# Patient Record
Sex: Female | Born: 1940 | Race: White | Hispanic: No | Marital: Married | Smoking: Never smoker
Health system: Southern US, Community
[De-identification: ages and names within clinical notes are randomized; demographics above are authoritative.]

## PROBLEM LIST (undated history)

## (undated) DIAGNOSIS — I10 Essential (primary) hypertension: Secondary | ICD-10-CM

## (undated) DIAGNOSIS — D649 Anemia, unspecified: Secondary | ICD-10-CM

## (undated) DIAGNOSIS — M316 Other giant cell arteritis: Secondary | ICD-10-CM

## (undated) DIAGNOSIS — E785 Hyperlipidemia, unspecified: Secondary | ICD-10-CM

## (undated) DIAGNOSIS — E119 Type 2 diabetes mellitus without complications: Secondary | ICD-10-CM

## (undated) DIAGNOSIS — K635 Polyp of colon: Secondary | ICD-10-CM

## (undated) DIAGNOSIS — G4730 Sleep apnea, unspecified: Secondary | ICD-10-CM

## (undated) DIAGNOSIS — M545 Low back pain, unspecified: Secondary | ICD-10-CM

## (undated) DIAGNOSIS — J45909 Unspecified asthma, uncomplicated: Secondary | ICD-10-CM

## (undated) DIAGNOSIS — G8929 Other chronic pain: Secondary | ICD-10-CM

## (undated) DIAGNOSIS — R0602 Shortness of breath: Secondary | ICD-10-CM

## (undated) DIAGNOSIS — I639 Cerebral infarction, unspecified: Secondary | ICD-10-CM

## (undated) DIAGNOSIS — D696 Thrombocytopenia, unspecified: Secondary | ICD-10-CM

## (undated) DIAGNOSIS — M25559 Pain in unspecified hip: Secondary | ICD-10-CM

## (undated) DIAGNOSIS — R0600 Dyspnea, unspecified: Secondary | ICD-10-CM

## (undated) DIAGNOSIS — M1990 Unspecified osteoarthritis, unspecified site: Secondary | ICD-10-CM

## (undated) DIAGNOSIS — D509 Iron deficiency anemia, unspecified: Secondary | ICD-10-CM

## (undated) DIAGNOSIS — K219 Gastro-esophageal reflux disease without esophagitis: Secondary | ICD-10-CM

## (undated) DIAGNOSIS — I2510 Atherosclerotic heart disease of native coronary artery without angina pectoris: Secondary | ICD-10-CM

## (undated) DIAGNOSIS — I509 Heart failure, unspecified: Secondary | ICD-10-CM

## (undated) DIAGNOSIS — R001 Bradycardia, unspecified: Secondary | ICD-10-CM

## (undated) DIAGNOSIS — D472 Monoclonal gammopathy: Secondary | ICD-10-CM

## (undated) DIAGNOSIS — I8500 Esophageal varices without bleeding: Secondary | ICD-10-CM

## (undated) DIAGNOSIS — K7460 Unspecified cirrhosis of liver: Secondary | ICD-10-CM

## (undated) HISTORY — DX: Shortness of breath: R06.02

## (undated) HISTORY — DX: Low back pain: M54.5

## (undated) HISTORY — DX: Thrombocytopenia, unspecified: D69.6

## (undated) HISTORY — PX: JOINT REPLACEMENT: SHX530

## (undated) HISTORY — PX: CORONARY ANGIOPLASTY: SHX604

## (undated) HISTORY — DX: Polyp of colon: K63.5

## (undated) HISTORY — DX: Low back pain, unspecified: M54.50

## (undated) HISTORY — DX: Other chronic pain: G89.29

## (undated) HISTORY — PX: OTHER SURGICAL HISTORY: SHX169

## (undated) HISTORY — PX: COLONOSCOPY: SHX174

## (undated) HISTORY — DX: Anemia, unspecified: D64.9

## (undated) HISTORY — DX: Sleep apnea, unspecified: G47.30

## (undated) HISTORY — DX: Iron deficiency anemia, unspecified: D50.9

## (undated) HISTORY — DX: Atherosclerotic heart disease of native coronary artery without angina pectoris: I25.10

## (undated) HISTORY — DX: Hyperlipidemia, unspecified: E78.5

## (undated) HISTORY — DX: Monoclonal gammopathy: D47.2

## (undated) HISTORY — DX: Gastro-esophageal reflux disease without esophagitis: K21.9

---

## 1998-09-26 HISTORY — PX: CORONARY ARTERY BYPASS GRAFT: SHX141

## 2003-03-31 ENCOUNTER — Other Ambulatory Visit: Payer: Self-pay

## 2004-01-01 ENCOUNTER — Ambulatory Visit: Payer: Self-pay | Admitting: Internal Medicine

## 2004-01-27 ENCOUNTER — Ambulatory Visit: Payer: Self-pay | Admitting: Internal Medicine

## 2004-03-04 ENCOUNTER — Ambulatory Visit: Payer: Self-pay | Admitting: Internal Medicine

## 2004-03-28 ENCOUNTER — Ambulatory Visit: Payer: Self-pay | Admitting: Internal Medicine

## 2004-11-02 ENCOUNTER — Ambulatory Visit: Payer: Self-pay | Admitting: Internal Medicine

## 2004-11-24 ENCOUNTER — Ambulatory Visit: Payer: Self-pay | Admitting: Internal Medicine

## 2004-11-26 ENCOUNTER — Ambulatory Visit: Payer: Self-pay | Admitting: Internal Medicine

## 2005-08-29 ENCOUNTER — Ambulatory Visit: Payer: Self-pay | Admitting: Internal Medicine

## 2005-09-25 ENCOUNTER — Ambulatory Visit: Payer: Self-pay | Admitting: Internal Medicine

## 2006-03-14 ENCOUNTER — Ambulatory Visit: Payer: Self-pay | Admitting: Internal Medicine

## 2006-07-05 ENCOUNTER — Inpatient Hospital Stay: Payer: Self-pay | Admitting: Internal Medicine

## 2006-07-11 ENCOUNTER — Ambulatory Visit: Payer: Self-pay | Admitting: Unknown Physician Specialty

## 2006-09-26 ENCOUNTER — Ambulatory Visit: Payer: Self-pay | Admitting: Internal Medicine

## 2006-10-03 ENCOUNTER — Ambulatory Visit: Payer: Self-pay | Admitting: Internal Medicine

## 2006-10-27 ENCOUNTER — Ambulatory Visit: Payer: Self-pay | Admitting: Internal Medicine

## 2007-04-12 ENCOUNTER — Ambulatory Visit: Payer: Self-pay | Admitting: Internal Medicine

## 2007-04-23 ENCOUNTER — Emergency Department: Payer: Self-pay | Admitting: Emergency Medicine

## 2007-05-01 ENCOUNTER — Ambulatory Visit: Payer: Self-pay | Admitting: Internal Medicine

## 2007-05-16 ENCOUNTER — Ambulatory Visit: Payer: Self-pay | Admitting: General Practice

## 2007-05-28 ENCOUNTER — Inpatient Hospital Stay: Payer: Self-pay | Admitting: General Practice

## 2007-07-02 ENCOUNTER — Inpatient Hospital Stay: Payer: Self-pay | Admitting: Internal Medicine

## 2007-09-26 ENCOUNTER — Ambulatory Visit: Payer: Self-pay | Admitting: Internal Medicine

## 2007-10-02 ENCOUNTER — Ambulatory Visit: Payer: Self-pay | Admitting: Internal Medicine

## 2007-10-27 ENCOUNTER — Ambulatory Visit: Payer: Self-pay | Admitting: Internal Medicine

## 2008-03-17 ENCOUNTER — Ambulatory Visit: Payer: Self-pay | Admitting: Internal Medicine

## 2008-04-04 ENCOUNTER — Ambulatory Visit: Payer: Self-pay | Admitting: Internal Medicine

## 2008-06-12 ENCOUNTER — Ambulatory Visit: Payer: Self-pay | Admitting: Internal Medicine

## 2008-06-30 ENCOUNTER — Ambulatory Visit: Payer: Self-pay | Admitting: Internal Medicine

## 2008-09-25 ENCOUNTER — Ambulatory Visit: Payer: Self-pay | Admitting: Internal Medicine

## 2008-10-02 ENCOUNTER — Inpatient Hospital Stay: Payer: Self-pay | Admitting: Unknown Physician Specialty

## 2008-10-06 ENCOUNTER — Ambulatory Visit: Payer: Self-pay | Admitting: Internal Medicine

## 2008-10-26 ENCOUNTER — Ambulatory Visit: Payer: Self-pay | Admitting: Internal Medicine

## 2008-11-26 ENCOUNTER — Ambulatory Visit: Payer: Self-pay | Admitting: Internal Medicine

## 2008-12-02 ENCOUNTER — Ambulatory Visit: Payer: Self-pay | Admitting: Internal Medicine

## 2008-12-08 ENCOUNTER — Ambulatory Visit: Payer: Self-pay | Admitting: Internal Medicine

## 2008-12-26 ENCOUNTER — Ambulatory Visit: Payer: Self-pay | Admitting: Internal Medicine

## 2009-01-26 ENCOUNTER — Ambulatory Visit: Payer: Self-pay | Admitting: Internal Medicine

## 2009-03-09 ENCOUNTER — Ambulatory Visit: Payer: Self-pay | Admitting: Internal Medicine

## 2009-03-28 ENCOUNTER — Ambulatory Visit: Payer: Self-pay | Admitting: Internal Medicine

## 2009-04-28 ENCOUNTER — Ambulatory Visit: Payer: Self-pay | Admitting: Internal Medicine

## 2009-06-02 ENCOUNTER — Ambulatory Visit: Payer: Self-pay | Admitting: Internal Medicine

## 2009-06-26 ENCOUNTER — Ambulatory Visit: Payer: Self-pay | Admitting: Internal Medicine

## 2009-07-26 ENCOUNTER — Ambulatory Visit: Payer: Self-pay | Admitting: Internal Medicine

## 2009-10-05 ENCOUNTER — Ambulatory Visit: Payer: Self-pay | Admitting: Internal Medicine

## 2009-10-26 ENCOUNTER — Ambulatory Visit: Payer: Self-pay | Admitting: Internal Medicine

## 2009-12-03 ENCOUNTER — Ambulatory Visit: Payer: Self-pay | Admitting: Internal Medicine

## 2009-12-23 ENCOUNTER — Ambulatory Visit: Payer: Self-pay | Admitting: Internal Medicine

## 2009-12-26 ENCOUNTER — Ambulatory Visit: Payer: Self-pay | Admitting: Internal Medicine

## 2010-01-11 ENCOUNTER — Ambulatory Visit: Payer: Self-pay | Admitting: Unknown Physician Specialty

## 2010-03-17 ENCOUNTER — Ambulatory Visit: Payer: Self-pay | Admitting: Internal Medicine

## 2010-03-28 ENCOUNTER — Ambulatory Visit: Payer: Self-pay | Admitting: Internal Medicine

## 2010-04-28 ENCOUNTER — Ambulatory Visit: Payer: Self-pay | Admitting: Internal Medicine

## 2010-05-27 ENCOUNTER — Ambulatory Visit: Payer: Self-pay | Admitting: Internal Medicine

## 2010-06-27 ENCOUNTER — Ambulatory Visit: Payer: Self-pay | Admitting: Internal Medicine

## 2010-08-13 ENCOUNTER — Ambulatory Visit: Payer: Self-pay | Admitting: Internal Medicine

## 2010-08-27 ENCOUNTER — Ambulatory Visit: Payer: Self-pay | Admitting: Internal Medicine

## 2010-09-26 ENCOUNTER — Ambulatory Visit: Payer: Self-pay | Admitting: Internal Medicine

## 2010-10-27 DIAGNOSIS — D472 Monoclonal gammopathy: Secondary | ICD-10-CM

## 2010-10-27 HISTORY — DX: Monoclonal gammopathy: D47.2

## 2010-11-05 ENCOUNTER — Ambulatory Visit: Payer: Self-pay | Admitting: Internal Medicine

## 2010-11-27 ENCOUNTER — Ambulatory Visit: Payer: Self-pay | Admitting: Internal Medicine

## 2010-12-27 ENCOUNTER — Ambulatory Visit: Payer: Self-pay | Admitting: Internal Medicine

## 2011-01-27 ENCOUNTER — Ambulatory Visit: Payer: Self-pay | Admitting: Internal Medicine

## 2011-03-08 ENCOUNTER — Ambulatory Visit: Payer: Self-pay | Admitting: Internal Medicine

## 2011-03-10 ENCOUNTER — Ambulatory Visit: Payer: Self-pay | Admitting: Internal Medicine

## 2011-03-29 ENCOUNTER — Ambulatory Visit: Payer: Self-pay | Admitting: Internal Medicine

## 2011-04-29 ENCOUNTER — Ambulatory Visit: Payer: Self-pay | Admitting: Internal Medicine

## 2011-05-23 ENCOUNTER — Inpatient Hospital Stay: Payer: Self-pay | Admitting: Internal Medicine

## 2011-05-23 DIAGNOSIS — R5383 Other fatigue: Secondary | ICD-10-CM

## 2011-05-23 DIAGNOSIS — R5381 Other malaise: Secondary | ICD-10-CM

## 2011-05-23 LAB — BASIC METABOLIC PANEL
Anion Gap: 12 (ref 7–16)
BUN: 17 mg/dL (ref 7–18)
Calcium, Total: 9.1 mg/dL (ref 8.5–10.1)
Co2: 25 mmol/L (ref 21–32)
EGFR (African American): >60
Osmolality: 287 (ref 275–301)
Sodium: 143 mmol/L (ref 136–145)

## 2011-05-23 LAB — CBC WITH DIFFERENTIAL/PLATELET
Basophil %: 0.1 %
Eosinophil %: 0.5 %
HCT: 27 % — ABNORMAL LOW (ref 35.0–47.0)
Lymphocyte %: 33.2 %
MCV: 68 fL — ABNORMAL LOW (ref 80–100)
Monocyte %: 13.2 %
Neutrophil #: 3 10*3/uL (ref 1.4–6.5)
Neutrophil %: 53 %
Platelet: 112 10*3/uL — ABNORMAL LOW (ref 150–440)
RBC: 3.96 10*6/uL (ref 3.80–5.20)
RDW: 23.4 % — ABNORMAL HIGH (ref 11.5–14.5)
WBC: 5.6 10*3/uL (ref 3.6–11.0)

## 2011-05-23 LAB — CK TOTAL AND CKMB (NOT AT ARMC): CK, Total: 59 U/L (ref 21–215)

## 2011-05-24 ENCOUNTER — Ambulatory Visit: Payer: Self-pay | Admitting: Internal Medicine

## 2011-05-24 HISTORY — PX: ESOPHAGOGASTRODUODENOSCOPY ENDOSCOPY: SHX5814

## 2011-05-24 LAB — COMPREHENSIVE METABOLIC PANEL
Albumin: 3 g/dL — ABNORMAL LOW (ref 3.4–5.0)
Alkaline Phosphatase: 124 U/L (ref 50–136)
Anion Gap: 14 (ref 7–16)
BUN: 14 mg/dL (ref 7–18)
Bilirubin,Total: 1 mg/dL (ref 0.2–1.0)
Calcium, Total: 8.4 mg/dL — ABNORMAL LOW (ref 8.5–10.1)
Chloride: 107 mmol/L (ref 98–107)
Co2: 23 mmol/L (ref 21–32)
Creatinine: 0.93 mg/dL (ref 0.60–1.30)
EGFR (African American): >60
EGFR (Non-African Amer.): >60
Glucose: 96 mg/dL (ref 65–99)
Osmolality: 287 (ref 275–301)
Potassium: 3.7 mmol/L (ref 3.5–5.1)
Sodium: 144 mmol/L (ref 136–145)
Total Protein: 7.3 g/dL (ref 6.4–8.2)

## 2011-05-24 LAB — CK TOTAL AND CKMB (NOT AT ARMC)
CK, Total: 62 U/L (ref 21–215)
CK, Total: 80 U/L (ref 21–215)
CK-MB: 1.3 ng/mL (ref 0.5–3.6)

## 2011-05-24 LAB — CBC WITH DIFFERENTIAL/PLATELET
Basophil #: 0 10*3/uL (ref 0.0–0.1)
Eosinophil #: 0.1 10*3/uL (ref 0.0–0.7)
HGB: 7.9 g/dL — ABNORMAL LOW (ref 12.0–16.0)
Lymphocyte #: 1.8 10*3/uL (ref 1.0–3.6)
MCH: 21.8 pg — ABNORMAL LOW (ref 26.0–34.0)
MCHC: 32 g/dL (ref 32.0–36.0)
Monocyte #: 0.6 10*3/uL (ref 0.0–0.7)
Monocyte %: 13.4 %
Neutrophil #: 2.2 10*3/uL (ref 1.4–6.5)
Neutrophil %: 45.9 %
Platelet: 98 10*3/uL — ABNORMAL LOW (ref 150–440)
RBC: 3.61 10*6/uL — ABNORMAL LOW (ref 3.80–5.20)
RDW: 23.1 % — ABNORMAL HIGH (ref 11.5–14.5)

## 2011-05-24 LAB — URINALYSIS, COMPLETE
Bilirubin,UR: NEGATIVE
Glucose,UR: NEGATIVE mg/dL (ref 0–75)
Leukocyte Esterase: NEGATIVE
Ph: 6 (ref 4.5–8.0)
RBC,UR: 1 /HPF (ref 0–5)
Specific Gravity: 1.012 (ref 1.003–1.030)
Squamous Epithelial: 6
WBC UR: 1 /HPF (ref 0–5)

## 2011-05-24 LAB — TROPONIN I
Troponin-I: <0.02 ng/mL
Troponin-I: <0.02 ng/mL

## 2011-05-25 LAB — CBC WITH DIFFERENTIAL/PLATELET
Basophil #: 0 10*3/uL (ref 0.0–0.1)
Basophil %: 0.7 %
Eosinophil %: 2.3 %
HCT: 30.6 % — ABNORMAL LOW (ref 35.0–47.0)
HGB: 9.8 g/dL — ABNORMAL LOW (ref 12.0–16.0)
Lymphocyte %: 39.6 %
MCH: 23.3 pg — ABNORMAL LOW (ref 26.0–34.0)
MCHC: 32.2 g/dL (ref 32.0–36.0)
Monocyte #: 0.7 10*3/uL (ref 0.0–0.7)
Neutrophil #: 1.8 10*3/uL (ref 1.4–6.5)
Neutrophil %: 42.3 %
Platelet: 111 10*3/uL — ABNORMAL LOW (ref 150–440)
RBC: 4.23 10*6/uL (ref 3.80–5.20)
WBC: 4.4 10*3/uL (ref 3.6–11.0)

## 2011-05-25 LAB — BASIC METABOLIC PANEL
Calcium, Total: 8.5 mg/dL (ref 8.5–10.1)
Chloride: 108 mmol/L — ABNORMAL HIGH (ref 98–107)
Co2: 23 mmol/L (ref 21–32)
Creatinine: 1 mg/dL (ref 0.60–1.30)
EGFR (Non-African Amer.): 58 — ABNORMAL LOW
Glucose: 83 mg/dL (ref 65–99)
Potassium: 3.4 mmol/L — ABNORMAL LOW (ref 3.5–5.1)
Sodium: 143 mmol/L (ref 136–145)

## 2011-05-27 ENCOUNTER — Ambulatory Visit: Payer: Self-pay | Admitting: Internal Medicine

## 2011-06-02 ENCOUNTER — Ambulatory Visit: Payer: Self-pay | Admitting: Internal Medicine

## 2011-06-02 LAB — IRON AND TIBC
Iron Bind.Cap.(Total): 304 ug/dL (ref 250–450)
Iron Saturation: 9 %
Iron: 27 ug/dL — ABNORMAL LOW (ref 50–170)
Unbound Iron-Bind.Cap.: 277 ug/dL

## 2011-06-02 LAB — CBC CANCER CENTER
Basophil #: 0 x10 3/mm (ref 0.0–0.1)
Eosinophil #: 0.1 x10 3/mm (ref 0.0–0.7)
HCT: 32 % — ABNORMAL LOW (ref 35.0–47.0)
MCH: 23.4 pg — ABNORMAL LOW (ref 26.0–34.0)
MCHC: 32.9 g/dL (ref 32.0–36.0)
MCV: 71 fL — ABNORMAL LOW (ref 80–100)
Monocyte %: 13.2 %
Neutrophil #: 3.1 x10 3/mm (ref 1.4–6.5)
Platelet: 108 x10 3/mm — ABNORMAL LOW (ref 150–440)
RDW: 25.6 % — ABNORMAL HIGH (ref 11.5–14.5)

## 2011-06-02 LAB — FERRITIN: Ferritin (ARMC): 17 ng/mL (ref 8–388)

## 2011-06-27 ENCOUNTER — Ambulatory Visit: Payer: Self-pay | Admitting: Internal Medicine

## 2011-07-14 LAB — IRON AND TIBC
Iron Bind.Cap.(Total): 242 ug/dL — ABNORMAL LOW (ref 250–450)
Unbound Iron-Bind.Cap.: 166 ug/dL

## 2011-07-14 LAB — CANCER CENTER HEMOGLOBIN: HGB: 11.9 g/dL — ABNORMAL LOW (ref 12.0–16.0)

## 2011-07-14 LAB — FERRITIN: Ferritin (ARMC): 82 ng/mL (ref 8–388)

## 2011-07-27 ENCOUNTER — Ambulatory Visit: Payer: Self-pay | Admitting: Internal Medicine

## 2011-08-27 ENCOUNTER — Ambulatory Visit: Payer: Self-pay | Admitting: Internal Medicine

## 2011-10-04 ENCOUNTER — Ambulatory Visit: Payer: Self-pay | Admitting: Internal Medicine

## 2011-10-04 LAB — FERRITIN: Ferritin (ARMC): 36 ng/mL (ref 8–388)

## 2011-10-04 LAB — CBC CANCER CENTER
Basophil %: 0.2 %
Eosinophil #: 0.1 x10 3/mm (ref 0.0–0.7)
Eosinophil %: 1.1 %
HCT: 39.3 % (ref 35.0–47.0)
HGB: 13 g/dL (ref 12.0–16.0)
Lymphocyte #: 0.9 x10 3/mm — ABNORMAL LOW (ref 1.0–3.6)
Lymphocyte %: 12.9 %
Neutrophil %: 76.8 %
Platelet: 106 x10 3/mm — ABNORMAL LOW (ref 150–440)
RBC: 4.59 10*6/uL (ref 3.80–5.20)
WBC: 7.2 x10 3/mm (ref 3.6–11.0)

## 2011-10-04 LAB — IRON AND TIBC
Iron Bind.Cap.(Total): 289 ug/dL (ref 250–450)
Unbound Iron-Bind.Cap.: 191 ug/dL

## 2011-10-27 ENCOUNTER — Ambulatory Visit: Payer: Self-pay | Admitting: Internal Medicine

## 2011-11-29 ENCOUNTER — Ambulatory Visit: Payer: Self-pay | Admitting: Internal Medicine

## 2011-12-27 ENCOUNTER — Ambulatory Visit: Payer: Self-pay | Admitting: Internal Medicine

## 2012-01-17 ENCOUNTER — Ambulatory Visit: Payer: Self-pay | Admitting: Internal Medicine

## 2012-01-24 LAB — CBC CANCER CENTER
Basophil #: 0.1 x10 3/mm (ref 0.0–0.1)
Basophil %: 1.1 %
Eosinophil #: 0.1 x10 3/mm (ref 0.0–0.7)
HGB: 11.7 g/dL — ABNORMAL LOW (ref 12.0–16.0)
MCH: 28.8 pg (ref 26.0–34.0)
MCV: 87 fL (ref 80–100)
Monocyte #: 0.5 x10 3/mm (ref 0.2–0.9)
Neutrophil #: 3.3 x10 3/mm (ref 1.4–6.5)
Platelet: 71 x10 3/mm — ABNORMAL LOW (ref 150–440)
RDW: 16.3 % — ABNORMAL HIGH (ref 11.5–14.5)
WBC: 4.9 x10 3/mm (ref 3.6–11.0)

## 2012-01-24 LAB — IRON AND TIBC
Iron Bind.Cap.(Total): 281 ug/dL (ref 250–450)
Iron Saturation: 25 %
Iron: 71 ug/dL (ref 50–170)

## 2012-01-27 ENCOUNTER — Ambulatory Visit: Payer: Self-pay | Admitting: Internal Medicine

## 2012-01-31 ENCOUNTER — Ambulatory Visit: Payer: Self-pay | Admitting: Internal Medicine

## 2012-02-15 ENCOUNTER — Ambulatory Visit: Payer: Self-pay | Admitting: Internal Medicine

## 2012-02-15 LAB — CBC CANCER CENTER
Basophil %: 0.9 %
Eosinophil %: 2.2 %
HCT: 39.8 % (ref 35.0–47.0)
HGB: 13.1 g/dL (ref 12.0–16.0)
MCHC: 33 g/dL (ref 32.0–36.0)
Monocyte %: 11.6 %
Neutrophil %: 52.4 %
Platelet: 105 x10 3/mm — ABNORMAL LOW (ref 150–440)
RBC: 4.62 10*6/uL (ref 3.80–5.20)
WBC: 8 x10 3/mm (ref 3.6–11.0)

## 2012-02-26 ENCOUNTER — Ambulatory Visit: Payer: Self-pay | Admitting: Internal Medicine

## 2012-03-28 ENCOUNTER — Ambulatory Visit: Payer: Self-pay | Admitting: Internal Medicine

## 2012-05-14 ENCOUNTER — Ambulatory Visit: Payer: Self-pay | Admitting: Internal Medicine

## 2012-05-15 LAB — CBC CANCER CENTER
Basophil %: 0.8 %
Eosinophil #: 0.1 x10 3/mm (ref 0.0–0.7)
HGB: 13.6 g/dL (ref 12.0–16.0)
Lymphocyte #: 1.3 x10 3/mm (ref 1.0–3.6)
MCH: 29.2 pg (ref 26.0–34.0)
MCHC: 34.7 g/dL (ref 32.0–36.0)
Monocyte #: 0.7 x10 3/mm (ref 0.2–0.9)
Monocyte %: 10.8 %
Neutrophil #: 4.5 x10 3/mm (ref 1.4–6.5)
Platelet: 93 x10 3/mm — ABNORMAL LOW (ref 150–440)
RBC: 4.66 10*6/uL (ref 3.80–5.20)

## 2012-05-15 LAB — IRON AND TIBC: Iron Saturation: 27 %

## 2012-05-26 ENCOUNTER — Ambulatory Visit: Payer: Self-pay | Admitting: Internal Medicine

## 2012-09-03 ENCOUNTER — Ambulatory Visit: Payer: Self-pay | Admitting: Internal Medicine

## 2012-09-04 LAB — CBC CANCER CENTER
Basophil #: 0.1 x10 3/mm (ref 0.0–0.1)
Basophil %: 0.8 %
Lymphocyte #: 1 x10 3/mm (ref 1.0–3.6)
MCH: 29.3 pg (ref 26.0–34.0)
MCHC: 35.3 g/dL (ref 32.0–36.0)
MCV: 83 fL (ref 80–100)
Monocyte #: 0.6 x10 3/mm (ref 0.2–0.9)
Monocyte %: 9.6 %
Neutrophil #: 4.4 x10 3/mm (ref 1.4–6.5)
Neutrophil %: 71.8 %
RBC: 4.56 10*6/uL (ref 3.80–5.20)
WBC: 6.1 x10 3/mm (ref 3.6–11.0)

## 2012-09-04 LAB — CREATININE, SERUM: Creatinine: 1.18 mg/dL (ref 0.60–1.30)

## 2012-09-04 LAB — IRON AND TIBC: Iron Bind.Cap.(Total): 309 ug/dL (ref 250–450)

## 2012-09-04 LAB — FERRITIN: Ferritin (ARMC): 23 ng/mL (ref 8–388)

## 2012-09-06 LAB — PROT IMMUNOELECTROPHORES(ARMC)

## 2012-09-25 ENCOUNTER — Ambulatory Visit: Payer: Self-pay | Admitting: Internal Medicine

## 2012-11-28 ENCOUNTER — Inpatient Hospital Stay: Payer: Self-pay | Admitting: Internal Medicine

## 2012-11-28 LAB — CBC WITH DIFFERENTIAL/PLATELET
Basophil #: 0 10*3/uL (ref 0.0–0.1)
Eosinophil #: 0.1 10*3/uL (ref 0.0–0.7)
Eosinophil %: 2.2 %
HCT: 35.3 % (ref 35.0–47.0)
HGB: 12.1 g/dL (ref 12.0–16.0)
Lymphocyte #: 0.9 10*3/uL — ABNORMAL LOW (ref 1.0–3.6)
Lymphocyte %: 18.2 %
Monocyte #: 0.5 x10 3/mm (ref 0.2–0.9)
Monocyte %: 10.2 %
Neutrophil #: 3.3 10*3/uL (ref 1.4–6.5)
RBC: 4.23 10*6/uL (ref 3.80–5.20)
RDW: 15.9 % — ABNORMAL HIGH (ref 11.5–14.5)

## 2012-11-28 LAB — COMPREHENSIVE METABOLIC PANEL
Albumin: 3.1 g/dL — ABNORMAL LOW (ref 3.4–5.0)
Alkaline Phosphatase: 135 U/L (ref 50–136)
Anion Gap: 6 — ABNORMAL LOW (ref 7–16)
BUN: 12 mg/dL (ref 7–18)
Calcium, Total: 8.9 mg/dL (ref 8.5–10.1)
Creatinine: 0.96 mg/dL (ref 0.60–1.30)
EGFR (African American): >60
EGFR (Non-African Amer.): 58 — ABNORMAL LOW
Glucose: 171 mg/dL — ABNORMAL HIGH (ref 65–99)
Potassium: 3.9 mmol/L (ref 3.5–5.1)
SGPT (ALT): 37 U/L (ref 12–78)
Total Protein: 7.1 g/dL (ref 6.4–8.2)

## 2012-11-28 LAB — TSH: Thyroid Stimulating Horm: 1.86 u[IU]/mL

## 2012-11-28 LAB — TROPONIN I: Troponin-I: <0.02 ng/mL

## 2012-11-29 LAB — CBC WITH DIFFERENTIAL/PLATELET
Basophil %: 1.1 %
Eosinophil #: 0.1 10*3/uL (ref 0.0–0.7)
Eosinophil %: 2.9 %
HCT: 34.8 % — ABNORMAL LOW (ref 35.0–47.0)
HGB: 11.9 g/dL — ABNORMAL LOW (ref 12.0–16.0)
Lymphocyte %: 24.2 %
MCHC: 34.2 g/dL (ref 32.0–36.0)
MCV: 83 fL (ref 80–100)
Platelet: 70 10*3/uL — ABNORMAL LOW (ref 150–440)

## 2012-11-29 LAB — URINALYSIS, COMPLETE
Bilirubin,UR: NEGATIVE
Glucose,UR: NEGATIVE mg/dL (ref 0–75)
Ketone: NEGATIVE
Nitrite: POSITIVE
Ph: 7 (ref 4.5–8.0)
RBC,UR: 2 /HPF (ref 0–5)
Specific Gravity: 1.011 (ref 1.003–1.030)
Squamous Epithelial: 2

## 2012-11-29 LAB — BASIC METABOLIC PANEL
BUN: 12 mg/dL (ref 7–18)
Calcium, Total: 9 mg/dL (ref 8.5–10.1)
Co2: 26 mmol/L (ref 21–32)
EGFR (African American): >60
EGFR (Non-African Amer.): >60
Glucose: 100 mg/dL — ABNORMAL HIGH (ref 65–99)
Potassium: 3.7 mmol/L (ref 3.5–5.1)
Sodium: 140 mmol/L (ref 136–145)

## 2012-11-29 LAB — LIPID PANEL
Cholesterol: 184 mg/dL (ref 0–200)
HDL Cholesterol: 52 mg/dL (ref 40–60)
Ldl Cholesterol, Calc: 121 mg/dL — ABNORMAL HIGH (ref 0–100)
Triglycerides: 57 mg/dL (ref 0–200)

## 2012-11-29 LAB — PROTIME-INR: Prothrombin Time: 14.4 secs (ref 11.5–14.7)

## 2012-11-30 LAB — CBC WITH DIFFERENTIAL/PLATELET
Basophil #: 0 10*3/uL (ref 0.0–0.1)
Basophil %: 0.5 %
Eosinophil %: 2.4 %
HCT: 33.6 % — ABNORMAL LOW (ref 35.0–47.0)
Lymphocyte %: 22 %
MCV: 83 fL (ref 80–100)
Monocyte #: 0.5 x10 3/mm (ref 0.2–0.9)
Monocyte %: 11.4 %
Neutrophil #: 2.8 10*3/uL (ref 1.4–6.5)
Platelet: 67 10*3/uL — ABNORMAL LOW (ref 150–440)
RBC: 4.06 10*6/uL (ref 3.80–5.20)
RDW: 15.6 % — ABNORMAL HIGH (ref 11.5–14.5)
WBC: 4.3 10*3/uL (ref 3.6–11.0)

## 2012-11-30 LAB — BASIC METABOLIC PANEL
BUN: 16 mg/dL (ref 7–18)
Chloride: 105 mmol/L (ref 98–107)
Co2: 26 mmol/L (ref 21–32)
Creatinine: 0.81 mg/dL (ref 0.60–1.30)
Osmolality: 279 (ref 275–301)
Potassium: 3.5 mmol/L (ref 3.5–5.1)

## 2012-12-25 ENCOUNTER — Ambulatory Visit: Payer: Self-pay | Admitting: Internal Medicine

## 2012-12-25 LAB — CBC CANCER CENTER
Basophil #: 0 x10 3/mm (ref 0.0–0.1)
Eosinophil %: 2.4 %
HCT: 37.4 % (ref 35.0–47.0)
HGB: 12.7 g/dL (ref 12.0–16.0)
Lymphocyte #: 1.3 x10 3/mm (ref 1.0–3.6)
MCH: 28.3 pg (ref 26.0–34.0)
MCV: 83 fL (ref 80–100)
Neutrophil #: 4 x10 3/mm (ref 1.4–6.5)
Neutrophil %: 64.8 %
RDW: 15.9 % — ABNORMAL HIGH (ref 11.5–14.5)
WBC: 6.2 x10 3/mm (ref 3.6–11.0)

## 2012-12-25 LAB — IRON AND TIBC
Iron Bind.Cap.(Total): 286 ug/dL (ref 250–450)
Iron Saturation: 27 %
Iron: 76 ug/dL (ref 50–170)

## 2012-12-25 LAB — FERRITIN: Ferritin (ARMC): 22 ng/mL (ref 8–388)

## 2012-12-26 ENCOUNTER — Ambulatory Visit: Payer: Self-pay | Admitting: Internal Medicine

## 2013-04-19 ENCOUNTER — Ambulatory Visit: Payer: Self-pay | Admitting: Internal Medicine

## 2013-04-19 LAB — CBC CANCER CENTER
BASOS ABS: 0 x10 3/mm (ref 0.0–0.1)
BASOS PCT: 0.5 %
EOS ABS: 0.1 x10 3/mm (ref 0.0–0.7)
Eosinophil %: 2 %
HCT: 36.3 % (ref 35.0–47.0)
HGB: 11.8 g/dL — AB (ref 12.0–16.0)
LYMPHS ABS: 1.2 x10 3/mm (ref 1.0–3.6)
Lymphocyte %: 20.4 %
MCH: 26.3 pg (ref 26.0–34.0)
MCHC: 32.4 g/dL (ref 32.0–36.0)
MCV: 81 fL (ref 80–100)
Monocyte #: 0.7 x10 3/mm (ref 0.2–0.9)
Monocyte %: 10.9 %
Neutrophil #: 4 x10 3/mm (ref 1.4–6.5)
Neutrophil %: 66.2 %
Platelet: 93 x10 3/mm — ABNORMAL LOW (ref 150–440)
RBC: 4.47 10*6/uL (ref 3.80–5.20)
RDW: 16.9 % — AB (ref 11.5–14.5)
WBC: 6 x10 3/mm (ref 3.6–11.0)

## 2013-04-19 LAB — IRON AND TIBC
IRON BIND. CAP.(TOTAL): 329 ug/dL (ref 250–450)
IRON: 71 ug/dL (ref 50–170)
Iron Saturation: 22 %
Unbound Iron-Bind.Cap.: 258 ug/dL

## 2013-04-19 LAB — FERRITIN: Ferritin (ARMC): 16 ng/mL (ref 8–388)

## 2013-04-28 ENCOUNTER — Ambulatory Visit: Payer: Self-pay | Admitting: Internal Medicine

## 2013-08-08 ENCOUNTER — Ambulatory Visit: Payer: Self-pay | Admitting: Internal Medicine

## 2013-08-09 LAB — CBC CANCER CENTER
BASOS ABS: 0 x10 3/mm (ref 0.0–0.1)
BASOS PCT: 0.7 %
Eosinophil #: 0.1 x10 3/mm (ref 0.0–0.7)
Eosinophil %: 2.1 %
HCT: 33.5 % — AB (ref 35.0–47.0)
HGB: 11.5 g/dL — ABNORMAL LOW (ref 12.0–16.0)
Lymphocyte #: 1.1 x10 3/mm (ref 1.0–3.6)
Lymphocyte %: 20 %
MCH: 27.9 pg (ref 26.0–34.0)
MCHC: 34.2 g/dL (ref 32.0–36.0)
MCV: 82 fL (ref 80–100)
Monocyte #: 0.6 x10 3/mm (ref 0.2–0.9)
Monocyte %: 10.8 %
NEUTROS PCT: 66.4 %
Neutrophil #: 3.6 x10 3/mm (ref 1.4–6.5)
PLATELETS: 73 x10 3/mm — AB (ref 150–440)
RBC: 4.11 10*6/uL (ref 3.80–5.20)
RDW: 17.2 % — ABNORMAL HIGH (ref 11.5–14.5)
WBC: 5.4 x10 3/mm (ref 3.6–11.0)

## 2013-08-09 LAB — FERRITIN: Ferritin (ARMC): 21 ng/mL (ref 8–388)

## 2013-08-09 LAB — IRON AND TIBC
IRON BIND. CAP.(TOTAL): 315 ug/dL (ref 250–450)
IRON SATURATION: 18 %
IRON: 57 ug/dL (ref 50–170)
UNBOUND IRON-BIND. CAP.: 258 ug/dL

## 2013-08-14 LAB — KAPPA/LAMBDA FREE LIGHT CHAINS (ARMC)

## 2013-08-14 LAB — PROT IMMUNOELECTROPHORES(ARMC)

## 2013-08-26 ENCOUNTER — Ambulatory Visit: Payer: Self-pay | Admitting: Internal Medicine

## 2013-09-09 DIAGNOSIS — I8500 Esophageal varices without bleeding: Secondary | ICD-10-CM | POA: Insufficient documentation

## 2013-09-13 ENCOUNTER — Ambulatory Visit: Payer: Self-pay | Admitting: Unknown Physician Specialty

## 2013-10-09 ENCOUNTER — Other Ambulatory Visit: Payer: Self-pay

## 2013-10-09 LAB — AMMONIA: Ammonia, Plasma: 45 mcmol/L — ABNORMAL HIGH (ref 11–32)

## 2013-10-24 ENCOUNTER — Ambulatory Visit: Payer: Self-pay | Admitting: Unknown Physician Specialty

## 2013-10-28 LAB — PATHOLOGY REPORT

## 2013-11-29 ENCOUNTER — Ambulatory Visit: Payer: Self-pay | Admitting: Internal Medicine

## 2013-11-29 LAB — CBC CANCER CENTER
BASOS PCT: 1.2 %
Basophil #: 0.1 x10 3/mm (ref 0.0–0.1)
Eosinophil #: 0.1 x10 3/mm (ref 0.0–0.7)
Eosinophil %: 1.7 %
HCT: 36.6 % (ref 35.0–47.0)
HGB: 12.5 g/dL (ref 12.0–16.0)
LYMPHS ABS: 1.3 x10 3/mm (ref 1.0–3.6)
LYMPHS PCT: 15 %
MCH: 29.1 pg (ref 26.0–34.0)
MCHC: 34.2 g/dL (ref 32.0–36.0)
MCV: 85 fL (ref 80–100)
MONO ABS: 0.9 x10 3/mm (ref 0.2–0.9)
MONOS PCT: 10.5 %
Neutrophil #: 6 x10 3/mm (ref 1.4–6.5)
Neutrophil %: 71.6 %
PLATELETS: 106 x10 3/mm — AB (ref 150–440)
RBC: 4.31 10*6/uL (ref 3.80–5.20)
RDW: 16.2 % — ABNORMAL HIGH (ref 11.5–14.5)
WBC: 8.4 x10 3/mm (ref 3.6–11.0)

## 2013-11-29 LAB — IRON AND TIBC
IRON BIND. CAP.(TOTAL): 272 ug/dL (ref 250–450)
IRON SATURATION: 27 %
Iron: 73 ug/dL (ref 50–170)
Unbound Iron-Bind.Cap.: 199 ug/dL

## 2013-11-29 LAB — FERRITIN: Ferritin (ARMC): 45 ng/mL (ref 8–388)

## 2013-12-20 ENCOUNTER — Other Ambulatory Visit: Payer: Self-pay | Admitting: Unknown Physician Specialty

## 2013-12-20 LAB — CLOSTRIDIUM DIFFICILE(ARMC)

## 2013-12-22 LAB — STOOL CULTURE

## 2013-12-26 ENCOUNTER — Ambulatory Visit: Payer: Self-pay | Admitting: Internal Medicine

## 2014-01-22 ENCOUNTER — Ambulatory Visit: Payer: Self-pay | Admitting: Unknown Physician Specialty

## 2014-01-26 ENCOUNTER — Ambulatory Visit: Payer: Self-pay | Admitting: Unknown Physician Specialty

## 2014-01-29 ENCOUNTER — Inpatient Hospital Stay: Payer: Self-pay | Admitting: Internal Medicine

## 2014-01-29 LAB — CBC WITH DIFFERENTIAL/PLATELET
BASOS ABS: 0 10*3/uL (ref 0.0–0.1)
BASOS PCT: 0.4 %
EOS ABS: 0.1 10*3/uL (ref 0.0–0.7)
EOS PCT: 1.6 %
HCT: 26.9 % — ABNORMAL LOW (ref 35.0–47.0)
HGB: 9.2 g/dL — ABNORMAL LOW (ref 12.0–16.0)
LYMPHS ABS: 1.5 10*3/uL (ref 1.0–3.6)
Lymphocyte %: 19.7 %
MCH: 28.9 pg (ref 26.0–34.0)
MCHC: 34.2 g/dL (ref 32.0–36.0)
MCV: 84 fL (ref 80–100)
MONOS PCT: 7.4 %
Monocyte #: 0.6 x10 3/mm (ref 0.2–0.9)
NEUTROS PCT: 70.9 %
Neutrophil #: 5.5 10*3/uL (ref 1.4–6.5)
PLATELETS: 110 10*3/uL — AB (ref 150–440)
RBC: 3.19 10*6/uL — ABNORMAL LOW (ref 3.80–5.20)
RDW: 16.7 % — ABNORMAL HIGH (ref 11.5–14.5)
WBC: 7.8 10*3/uL (ref 3.6–11.0)

## 2014-01-29 LAB — BASIC METABOLIC PANEL
Anion Gap: 7 (ref 7–16)
BUN: 14 mg/dL (ref 7–18)
CO2: 29 mmol/L (ref 21–32)
CREATININE: 1.16 mg/dL (ref 0.60–1.30)
Calcium, Total: 8.3 mg/dL — ABNORMAL LOW (ref 8.5–10.1)
Chloride: 102 mmol/L (ref 98–107)
EGFR (African American): 59 — ABNORMAL LOW
EGFR (Non-African Amer.): 48 — ABNORMAL LOW
Glucose: 171 mg/dL — ABNORMAL HIGH (ref 65–99)
Osmolality: 280 (ref 275–301)
POTASSIUM: 3.6 mmol/L (ref 3.5–5.1)
Sodium: 138 mmol/L (ref 136–145)

## 2014-01-29 LAB — URINALYSIS, COMPLETE
BLOOD: NEGATIVE
Bilirubin,UR: NEGATIVE
Glucose,UR: NEGATIVE mg/dL (ref 0–75)
KETONE: NEGATIVE
Nitrite: NEGATIVE
Ph: 5 (ref 4.5–8.0)
Protein: NEGATIVE
RBC,UR: 1 /HPF (ref 0–5)
SPECIFIC GRAVITY: 1.005 (ref 1.003–1.030)
WBC UR: 54 /HPF (ref 0–5)

## 2014-01-29 LAB — TROPONIN I: Troponin-I: <0.02 ng/mL

## 2014-01-29 LAB — AMMONIA: AMMONIA, PLASMA: 31 umol/L (ref 11–32)

## 2014-01-31 LAB — CBC WITH DIFFERENTIAL/PLATELET
BASOS ABS: 0.1 10*3/uL (ref 0.0–0.1)
Basophil %: 0.8 %
Eosinophil #: 0.2 10*3/uL (ref 0.0–0.7)
Eosinophil %: 2.2 %
HCT: 24.4 % — ABNORMAL LOW (ref 35.0–47.0)
HGB: 8.4 g/dL — AB (ref 12.0–16.0)
LYMPHS PCT: 24.2 %
Lymphocyte #: 1.7 10*3/uL (ref 1.0–3.6)
MCH: 28.8 pg (ref 26.0–34.0)
MCHC: 34.5 g/dL (ref 32.0–36.0)
MCV: 84 fL (ref 80–100)
MONOS PCT: 9.9 %
Monocyte #: 0.7 x10 3/mm (ref 0.2–0.9)
Neutrophil #: 4.4 10*3/uL (ref 1.4–6.5)
Neutrophil %: 62.9 %
Platelet: 112 10*3/uL — ABNORMAL LOW (ref 150–440)
RBC: 2.92 10*6/uL — ABNORMAL LOW (ref 3.80–5.20)
RDW: 16.5 % — ABNORMAL HIGH (ref 11.5–14.5)
WBC: 6.9 10*3/uL (ref 3.6–11.0)

## 2014-01-31 LAB — BASIC METABOLIC PANEL
Anion Gap: 6 — ABNORMAL LOW (ref 7–16)
BUN: 11 mg/dL (ref 7–18)
CHLORIDE: 107 mmol/L (ref 98–107)
CO2: 29 mmol/L (ref 21–32)
CREATININE: 1.04 mg/dL (ref 0.60–1.30)
Calcium, Total: 7.9 mg/dL — ABNORMAL LOW (ref 8.5–10.1)
GFR CALC NON AF AMER: 55 — AB
Glucose: 114 mg/dL — ABNORMAL HIGH (ref 65–99)
Osmolality: 283 (ref 275–301)
Potassium: 3.4 mmol/L — ABNORMAL LOW (ref 3.5–5.1)
SODIUM: 142 mmol/L (ref 136–145)

## 2014-02-01 LAB — CBC WITH DIFFERENTIAL/PLATELET
BASOS ABS: 0 10*3/uL (ref 0.0–0.1)
BASOS PCT: 0.4 %
Basophil #: 0 10*3/uL (ref 0.0–0.1)
Basophil %: 0.4 %
EOS ABS: 0 10*3/uL (ref 0.0–0.7)
EOS ABS: 0.1 10*3/uL (ref 0.0–0.7)
Eosinophil %: 0.8 %
Eosinophil %: 0.7 %
HCT: 19.8 % — ABNORMAL LOW (ref 35.0–47.0)
HCT: 25.3 % — AB (ref 35.0–47.0)
HGB: 6.7 g/dL — AB (ref 12.0–16.0)
HGB: 8.5 g/dL — ABNORMAL LOW (ref 12.0–16.0)
LYMPHS ABS: 0.8 10*3/uL — AB (ref 1.0–3.6)
Lymphocyte #: 0.9 10*3/uL — ABNORMAL LOW (ref 1.0–3.6)
Lymphocyte %: 15.1 %
Lymphocyte %: 11.3 %
MCH: 28.8 pg (ref 26.0–34.0)
MCH: 28.8 pg (ref 26.0–34.0)
MCHC: 33.9 g/dL (ref 32.0–36.0)
MCHC: 33.7 g/dL (ref 32.0–36.0)
MCV: 85 fL (ref 80–100)
MCV: 86 fL (ref 80–100)
Monocyte #: 0.4 x10 3/mm (ref 0.2–0.9)
Monocyte #: 0.6 x10 3/mm (ref 0.2–0.9)
Monocyte %: 7.1 %
Monocyte %: 6.6 %
NEUTROS ABS: 6.8 10*3/uL — AB (ref 1.4–6.5)
NEUTROS PCT: 76.6 %
Neutrophil #: 4.3 10*3/uL (ref 1.4–6.5)
Neutrophil %: 81 %
PLATELETS: 67 10*3/uL — AB (ref 150–440)
Platelet: 83 10*3/uL — ABNORMAL LOW (ref 150–440)
RBC: 2.33 10*6/uL — ABNORMAL LOW (ref 3.80–5.20)
RBC: 2.96 10*6/uL — AB (ref 3.80–5.20)
RDW: 16.4 % — AB (ref 11.5–14.5)
RDW: 15.6 % — AB (ref 11.5–14.5)
WBC: 5.6 10*3/uL (ref 3.6–11.0)
WBC: 8.4 10*3/uL (ref 3.6–11.0)

## 2014-02-01 LAB — BASIC METABOLIC PANEL
Anion Gap: 6 — ABNORMAL LOW (ref 7–16)
BUN: 10 mg/dL (ref 7–18)
CALCIUM: 7.5 mg/dL — AB (ref 8.5–10.1)
Chloride: 111 mmol/L — ABNORMAL HIGH (ref 98–107)
Co2: 25 mmol/L (ref 21–32)
Creatinine: 0.9 mg/dL (ref 0.60–1.30)
EGFR (African American): >60
Glucose: 93 mg/dL (ref 65–99)
OSMOLALITY: 282 (ref 275–301)
Potassium: 3.9 mmol/L (ref 3.5–5.1)
Sodium: 142 mmol/L (ref 136–145)

## 2014-02-01 LAB — HEMOGLOBIN: HGB: 8.4 g/dL — AB (ref 12.0–16.0)

## 2014-02-01 LAB — URINE CULTURE

## 2014-02-02 LAB — CBC WITH DIFFERENTIAL/PLATELET
BASOS ABS: 0.1 10*3/uL (ref 0.0–0.1)
BASOS PCT: 0.8 %
EOS ABS: 0.1 10*3/uL (ref 0.0–0.7)
Eosinophil %: 1.5 %
HCT: 23.6 % — ABNORMAL LOW (ref 35.0–47.0)
HGB: 8.1 g/dL — ABNORMAL LOW (ref 12.0–16.0)
LYMPHS ABS: 1.1 10*3/uL (ref 1.0–3.6)
Lymphocyte %: 12.5 %
MCH: 28.9 pg (ref 26.0–34.0)
MCHC: 34.1 g/dL (ref 32.0–36.0)
MCV: 85 fL (ref 80–100)
MONOS PCT: 7.9 %
Monocyte #: 0.7 x10 3/mm (ref 0.2–0.9)
Neutrophil #: 7 10*3/uL — ABNORMAL HIGH (ref 1.4–6.5)
Neutrophil %: 77.3 %
Platelet: 75 10*3/uL — ABNORMAL LOW (ref 150–440)
RBC: 2.79 10*6/uL — ABNORMAL LOW (ref 3.80–5.20)
RDW: 15.6 % — AB (ref 11.5–14.5)
WBC: 9 10*3/uL (ref 3.6–11.0)

## 2014-02-02 LAB — BASIC METABOLIC PANEL
Anion Gap: 8 (ref 7–16)
BUN: 13 mg/dL (ref 7–18)
Calcium, Total: 7.3 mg/dL — ABNORMAL LOW (ref 8.5–10.1)
Chloride: 110 mmol/L — ABNORMAL HIGH (ref 98–107)
Co2: 23 mmol/L (ref 21–32)
Creatinine: 0.99 mg/dL (ref 0.60–1.30)
EGFR (African American): >60
GFR CALC NON AF AMER: 58 — AB
Glucose: 88 mg/dL (ref 65–99)
OSMOLALITY: 281 (ref 275–301)
POTASSIUM: 3.7 mmol/L (ref 3.5–5.1)
SODIUM: 141 mmol/L (ref 136–145)

## 2014-02-03 LAB — BASIC METABOLIC PANEL
ANION GAP: 7 (ref 7–16)
BUN: 15 mg/dL (ref 7–18)
CALCIUM: 7.5 mg/dL — AB (ref 8.5–10.1)
CREATININE: 1.01 mg/dL (ref 0.60–1.30)
Chloride: 109 mmol/L — ABNORMAL HIGH (ref 98–107)
Co2: 25 mmol/L (ref 21–32)
GFR CALC NON AF AMER: 57 — AB
Glucose: 93 mg/dL (ref 65–99)
OSMOLALITY: 282 (ref 275–301)
Potassium: 3.6 mmol/L (ref 3.5–5.1)
SODIUM: 141 mmol/L (ref 136–145)

## 2014-02-03 LAB — CBC WITH DIFFERENTIAL/PLATELET
Basophil #: 0.1 10*3/uL (ref 0.0–0.1)
Basophil %: 0.8 %
EOS PCT: 1.3 %
Eosinophil #: 0.1 10*3/uL (ref 0.0–0.7)
HCT: 23.1 % — AB (ref 35.0–47.0)
HGB: 7.7 g/dL — ABNORMAL LOW (ref 12.0–16.0)
LYMPHS PCT: 11.9 %
Lymphocyte #: 0.9 10*3/uL — ABNORMAL LOW (ref 1.0–3.6)
MCH: 28.5 pg (ref 26.0–34.0)
MCHC: 33.5 g/dL (ref 32.0–36.0)
MCV: 85 fL (ref 80–100)
MONO ABS: 0.6 x10 3/mm (ref 0.2–0.9)
MONOS PCT: 8.3 %
NEUTROS ABS: 5.6 10*3/uL (ref 1.4–6.5)
Neutrophil %: 77.7 %
PLATELETS: 66 10*3/uL — AB (ref 150–440)
RBC: 2.71 10*6/uL — ABNORMAL LOW (ref 3.80–5.20)
RDW: 15.8 % — AB (ref 11.5–14.5)
WBC: 7.2 10*3/uL (ref 3.6–11.0)

## 2014-02-03 LAB — SEDIMENTATION RATE: Erythrocyte Sed Rate: 63 mm/hr — ABNORMAL HIGH (ref 0–30)

## 2014-02-05 ENCOUNTER — Ambulatory Visit: Payer: Self-pay | Admitting: Urgent Care

## 2014-02-05 LAB — CBC WITH DIFFERENTIAL/PLATELET
BASOS PCT: 0.3 %
Basophil #: 0 10*3/uL (ref 0.0–0.1)
EOS ABS: 0.1 10*3/uL (ref 0.0–0.7)
EOS PCT: 2.5 %
HCT: 26.2 % — AB (ref 35.0–47.0)
HGB: 8.8 g/dL — ABNORMAL LOW (ref 12.0–16.0)
LYMPHS ABS: 0.7 10*3/uL — AB (ref 1.0–3.6)
Lymphocyte %: 12.5 %
MCH: 28.8 pg (ref 26.0–34.0)
MCHC: 33.5 g/dL (ref 32.0–36.0)
MCV: 86 fL (ref 80–100)
MONO ABS: 0.7 x10 3/mm (ref 0.2–0.9)
Monocyte %: 13.4 %
Neutrophil #: 3.8 10*3/uL (ref 1.4–6.5)
Neutrophil %: 71.3 %
PLATELETS: 70 10*3/uL — AB (ref 150–440)
RBC: 3.05 10*6/uL — ABNORMAL LOW (ref 3.80–5.20)
RDW: 16.7 % — ABNORMAL HIGH (ref 11.5–14.5)
WBC: 5.3 10*3/uL (ref 3.6–11.0)

## 2014-04-16 ENCOUNTER — Ambulatory Visit: Payer: Self-pay | Admitting: Unknown Physician Specialty

## 2014-04-21 ENCOUNTER — Ambulatory Visit: Payer: Self-pay | Admitting: Internal Medicine

## 2014-04-28 ENCOUNTER — Ambulatory Visit: Payer: Self-pay | Admitting: Internal Medicine

## 2014-07-14 ENCOUNTER — Ambulatory Visit
Admit: 2014-07-14 | Disposition: A | Discharge status: Home / Self Care | Payer: Self-pay | Attending: Internal Medicine | Admitting: Internal Medicine

## 2014-07-14 LAB — CBC CANCER CENTER
Basophil #: 0 x10 3/mm (ref 0.0–0.1)
Basophil %: 0.7 %
EOS PCT: 1.8 %
Eosinophil #: 0.1 x10 3/mm (ref 0.0–0.7)
HCT: 36 % (ref 35.0–47.0)
HGB: 12.2 g/dL (ref 12.0–16.0)
LYMPHS PCT: 21.3 %
Lymphocyte #: 1.5 x10 3/mm (ref 1.0–3.6)
MCH: 26.9 pg (ref 26.0–34.0)
MCHC: 33.8 g/dL (ref 32.0–36.0)
MCV: 79 fL — ABNORMAL LOW (ref 80–100)
MONOS PCT: 8.8 %
Monocyte #: 0.6 x10 3/mm (ref 0.2–0.9)
NEUTROS ABS: 4.8 x10 3/mm (ref 1.4–6.5)
Neutrophil %: 67.4 %
Platelet: 81 x10 3/mm — ABNORMAL LOW (ref 150–440)
RBC: 4.53 10*6/uL (ref 3.80–5.20)
RDW: 20.9 % — AB (ref 11.5–14.5)
WBC: 7.2 x10 3/mm (ref 3.6–11.0)

## 2014-07-14 LAB — IRON AND TIBC
IRON BIND. CAP.(TOTAL): 322 (ref 250–450)
IRON: 52 ug/dL
Iron Saturation: 16.1
Unbound Iron-Bind.Cap.: 270

## 2014-07-14 LAB — CREATININE, SERUM
Creatinine: 0.99 mg/dL
EGFR (Non-African Amer.): 56 — ABNORMAL LOW

## 2014-07-14 LAB — FERRITIN: Ferritin (ARMC): 29 ng/mL

## 2014-07-14 LAB — CALCIUM: CALCIUM: 8.6 mg/dL — AB

## 2014-07-16 LAB — PROT IMMUNOELECTROPHORES(ARMC)

## 2014-07-16 LAB — KAPPA/LAMBDA FREE LIGHT CHAINS (ARMC)

## 2014-07-18 NOTE — H&P (Signed)
PATIENT NAME:  Theresa [name], Theresa [name] MR#:  [id number] DATE OF BIRTH:  08/11/1940  DATE OF ADMISSION:  11/28/2012  CHIEF COMPLAINT: Right-sided weakness.   HISTORY OF PRESENT ILLNESS:  The patient is a 74 year old female with history of coronary artery disease with history of prior bypass surgery as well as hypertension, hyperlipidemia, temporal arteritis with monoclonal gammopathy, history of cirrhosis. She recently had held  aspirin for about 2 weeks as she needed a hemorrhoid banding done, which was done last Tuesday. She restarted her aspirin this morning. She noticed yesterday that her right leg appeared to not be moving as well and that she was occasionally tripping on the foot, which seemed to be dragging. Her blood pressure was also higher than usual, 709 to 628 systolic. She states she called her son, who is a paramedic, and who examined her and felt this was probably a nerve irritation. She was not having any back pain or radiculitis symptoms. Today, she noticed that her right hand was not moving and, in addition, her right leg seemed to be about the same. There was no facial numbness. She reports that she occasionally has trouble swallowing, but that this had not changed. Her husband did not feel that her face appeared to look any different, nor does she have any speech difficulties. She presented to the office for further evaluation, at which time she was noted to have difficulty ambulating with what appeared to be a right foot drop, in addition noted to have pronator drift and dysmetria in the right arm, which is her dominant arm. She is admitted for right-sided weakness which was worrisome for acute CVA with a stuttering course.   PAST MEDICAL HISTORY 1.  Coronary artery disease with prior bypass surgery.  2.  Hypertension.  3.  Hyperlipidemia.  4.  Gastroesophageal reflux disease.  5.  Osteoarthritis.  6.  History of positive rheumatoid factor and monoclonal gammopathy.  7.  History of temporal  arteritis, previously on prednisone.  8.  Cirrhosis of the liver.  9.  History of left total knee replacement.  10.  History of right knee arthroscopy.   ALLERGIES: BAND-AIDS, LATEX, MORPHINE, NEOPRENE AND TAPE.   MEDICATIONS 1.  Lisinopril 40 mg p.o. daily.  2.  Omeprazole 20 mg p.o. daily.  3.  Levothyroxine 0.05 mg p.o. daily.  4.  Vitamin B12, 1 p.o. daily.  5.  Vitamin C 500 mg p.o. daily.  6.  Iron 1 p.o. daily.  7.  Lasix 40 mg p.o. daily as needed for edema.  8.  Multivitamin 1 p.o. daily.  9.  Calcium 600 mg p.o. b.i.d.  10.  Aspirin 81 mg daily, again recently held.  11.  Anusol-HC suppositories at bedtime as needed.  12.  Toprol-XL 50 mg p.o. daily.  13.  Antivert 12.5 mg p.o. b.i.d. p.r.n. vertigo.  14.  Amlodipine 5 mg p.o. daily.  15.  Simvastatin 20 mg p.o. daily.   SOCIAL HISTORY: No tobacco or alcohol is reported.   FAMILY HISTORY: Coronary artery disease.   REVIEW OF SYSTEMS: Please see history of present illness. Denies headache, vision changes, neck stiffness. Denies fevers, chills, night sweats. No bowel or bladder changes. The remainder of complete review of systems is negative.   PHYSICAL EXAMINATION VITAL SIGNS: Blood pressure 170/80, pulse 58, saturation 97% on room air.  GENERAL: Well-developed, well-nourished female in no distress.  EYES: Pupils round and reactive to light. Extraocular motion is intact.  EARS, NOSE AND THROAT: External examination unremarkable. Oropharynx  is moist without lesions. Facials symmetry appears preserved and tongue protrudes in the midline.  NECK: Supple. Trachea midline. No thyromegaly.  CARDIOVASCULAR: Regular rate and rhythm without gallops or rubs. Carotid and radial pulses are 2+. Postoperative changes.  LUNGS: Clear bilaterally without wheeze or retraction.  ABDOMEN: Soft, nontender, positive bowel sounds. No guarding or rebound.  SKIN: No significant rashes or nodules.  LYMPH NODES: No cervical or supraclavicular  nodes.  MUSCULOSKELETAL: No clubbing, cyanosis or significant edema.  NEUROLOGIC: Cranial nerves appear preserved. Right pronator drift is seen with decreased grip in the right hand and dysmetria with finger-to-nose. Right lower extremity strength is reduced as well throughout the leg. Light touch appears to be intact.   IMPRESSION AND PLAN 1.  Right-sided weakness. Again worrisome for cerebrovascular accident, likely mid brain but with stuttering nature. Unclear if related to holding aspirin. We will restart aspirin 324 mg daily, getting stat CT to exclude bleed prior to doing this. Set up carotid Dopplers and echocardiogram. Ultimately may need MRI to further evaluate but the patient is not sure whether she can do this, so we will hold off on ordering this until we get a chance to the above. Neurology consultation. Physical therapy, occupational therapy to see the patient as well.  2.  Hypertension. We will allow blood pressure run slightly higher than normal given possibility of acute event. Continue her home medications for now as blood pressure is already elevated.  3.  Dyspepsia. Change to pantoprazole.  4.  Hypothyroidism. Check TSH and continue levothyroxine, adjusting dose if necessary.  5.  History of temporal arteritis. Await sed rate.   ____________________________ Adin Hector, MD [id number] D: 11/28/2012 17:35:00 ET T: 11/28/2012 18:36:35 ET JOB#: [id number]  cc: Adin Hector, MD, <Dictator> Tracie Harrier, MD Ramonita Lab MD ELECTRONICALLY SIGNED 12/03/2012 12:38

## 2014-07-18 NOTE — Discharge Summary (Signed)
PATIENT NAME:  Theresa [name] MR#:  [id number] DATE OF BIRTH:  [birth year]  DIAGNOSES AT TIME OF DISCHARGE:  1.  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  2.  Hypertension.  3.  Hypothyroidism.  4.  History of cirrhosis of the liver and temporal arteritis.     CHIEF COMPLAINT: Right-sided weakness.   HISTORY OF PRESENT ILLNESS: Theresa [name] is a 74 year old female with a history of CAD, history of previous bypass surgery, hypertension, hyperlipidemia, temporal arteritis, monoclonal gammopathy, history of cirrhosis, withheld her aspirin about two weeks prior to hemorrhoid banding surgery, and apparently has started aspirin on the day of admission, but in the meantime, she notices weakness of her right lower extremity and apparently was tripping on her foot and seems to be dragging her right side. The patient's blood pressure was also elevated more than usually at around 794I to 016P systolic. The patient also noticed weakness of her right arm and came to the [facility] for further evaluation and subsequently admitted to the hospital.   PAST MEDICAL HISTORY: Significant for CAD status post bypass surgery, hypertension, hyperlipidemia, GERD, osteoarthritis, history of positive rheumatoid factor and monoclonal gammopathy, history of temporal arteritis, cirrhosis of the liver, history of left total knee replacement and then right knee arthroscopy.   PHYSICAL EXAMINATION:  VITAL SIGNS: Blood pressure is 170/80, pulse 58, saturations 97% on room air.  GENERAL: Not in distress. Pupils equal and reactive to light.  HEENT: NCAT.  NECK: Supple. Trachea midline. No thyromegaly.  HEART: S1, S2.  LUNGS: Clear to auscultation bilaterally.  ABDOMEN: Soft, nontender.  EXTREMITIES: No edema. The patient did have evidence of a pronator drift with decreased hand grip on the right hand and also weakness of right lower extremity.    HOSPITAL COURSE: The patient was admitted to [facility] and was started on aspirin. She also underwent an MRI of the brain which showed evidence for a small acute nonhemorrhagic infarct along with a left basal ganglia and left corona radiata. Ultrasound of the carotids showed scattered atherosclerotic block without evidence of significant hemodynamic stenosis. She was also seen by Dr. Melrose Nakayama, neurologist, and received physical therapy. She had some improvement in the weakness and overall was stable at the time of discharge.   DISCHARGE MEDICATIONS: She was discharged home on the following medications.  Amlodipine 5 mg once a day, levothyroxine 50 mcg a day, lisinopril 40 mg a day, metoprolol 50 mg once a day, pantoprazole 40 mg once a day, simvastatin 20 mg once a day, aspirin 325 mg p.o. daily.   DISCHARGE INSTRUCTIONS: The patient has been advised to undergo physical therapy at home, and we will try to arrange  home health nursing for her as well.   FOLLOWUP: She has been advised to follow up with me, Dr. Ginette Pitman, in 1 to 2 weeks' time and advised to call the office or report to the ER if she has any worsening weakness, speech disturbance, or any other new neurological symptoms. She will also follow up with Dr. Melrose Nakayama, neurologist, as an outpatient.   TOTAL TIME SPENT IN DISCHARGING THE PATIENT: 35 minutes.    ____________________________ Tracie Harrier, MD [id number] D: 11/30/2012 13:24:04 ET T: 11/30/2012 15:43:57 ET JOB#: [id number]  cc: Tracie Harrier, MD, <Dictator> Tracie Harrier MD ELECTRONICALLY SIGNED 12/13/2012 18:25

## 2014-07-18 NOTE — Consult Note (Signed)
Referring Physician:  Tama High   Primary Care Physician:  Tracie Harrier : [facility]- [facility], [address] [place], [place] [address], [phone]  Reason for Consult: Admit Date: 28-Nov-2012  Chief Complaint: Right sided weakness  Reason for Consult: right sided weakness   History of Present Illness: History of Present Illness:   BTY:OMAY old woman with a complex medical history including CABG, HTN, HLP and liver cirrhosis presents with symptoms of abrupt onset right leg and arm weakness.  The patient had been holding her home ASA dose recently due to hemorrhoid banding procedure and had been off it two weeks.  The patient and her family say that her symptoms started in the foot the evening before then the next morning she noticed that her right arm felt weak as well.  There have been no facial symptoms.  No significant numbness or tingling.  No speech problems.  She went in to her PCP office and was noted to have difficulty walking due to RLE weakness.  Given these symptoms she was sent to the ER for evaluation for presumed acute stroke.  Symptoms constant, nothing made symptoms better or worse.  No clear precipitating cause beyond stopping the ASA.  Symptoms have been persistent.   MEDICALSURGICAL HISTORY Coronary artery disease with prior bypass surgery.  Hypertension.  Hyperlipidemia.  Gastroesophageal reflux disease.  Osteoarthritis.  History of positive rheumatoid factor and monoclonal gammopathy.  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  Cirrhosis of the liver.  History of left total knee replacement.  History of right knee arthroscopy.   FAMILY HISTORY: Coronary artery disease. HISTORY: No tobacco or alcohol is reported.   Lisinopril 40 mg p.o. daily.  Omeprazole 20 mg p.o. daily.  Levothyroxine 0.05 mg p.o. daily.  Vitamin B12, 1 p.o. daily.  Vitamin C 500 mg p.o. daily.  Iron 1 p.o. daily.  Lasix 40 mg p.o. daily as needed for edema.   Multivitamin 1 p.o. daily.  Calcium 600 mg p.o. b.i.d.  Aspirin 81 mg daily, again recently held.  Anusol-HC suppositories at bedtime as needed.  Toprol-XL 50 mg p.o. daily.  Antivert 12.5 mg p.o. b.i.d. p.r.n. vertigo.  Amlodipine 5 mg p.o. daily.  Simvastatin 20 mg p.o. daily.   ALLERGIES: BAND-AIDs      ROS:  General denies complaints   HEENT no complaints   Lungs no complaints   Cardiac no complaints   GI no complaints   GU no complaints   Musculoskeletal no complaints   Extremities no complaints   Skin no complaints   Endocrine no complaints   Psych no complaints   Past Medical/Surgical Hx:  IDA:   History of CHF:   Hypertension:   Hypercholesterolemia:   temporal arthritis:   cad:   emphysema:   right knee surgery:   left knee replacemtn:   cabg:   Home Medications: Medication Instructions Last Modified Date/Time  Synthroid 50 mcg (0.05 mg) oral tablet 1  orally once a day (in the morning)  18-Feb-14 14:18  aspirin 81 mg oral delayed release capsule 1 cap(s) orally once a day 18-Feb-14 14:18  Calcium 600+D 600 mg-200 intl units oral tablet 1 tab(s) orally 2 times a day 04-HTX-77 41:42  folic acid 0.4 mg oral tablet 1 tab(s) orally once a day 18-Feb-14 14:18  lisinopril 40 mg oral tablet 1 tab(s) orally once a day 18-Feb-14 14:18  omeprazole 20 mg oral delayed release capsule 1 cap(s) orally once a day 18-Feb-14 14:18  Vitamin C 1000 mg oral tablet 1 tab(s) orally once a day 18-Feb-14 14:18   [facility] Current Meds:  Sodium Chloride 0.9%, 1000 ml at 40 ml/hr  Acetaminophen * tablet, ( Tylenol (325 mg) tablet)  650 mg Oral q4h PRN for pain or temp. greater than 100.4  - Indication: Pain/Fever  Pantoprazole tablet, 40 mg Oral daily  - Indication: Erosive Esophagitis/ GERD  Instructions:  DO NOT CRUSH  Levothyroxine tablet, ( Synthroid)  0.05 mg Oral q6am  - Indication: Thyroid Hormone Replacement  Lisinopril tablet, ( Zestril)  40 mg Oral daily  -  Indication: Hypertension/ CHF  amLODIPine tablet, ( Norvasc)  5 mg Oral daily  - Indication: Hypertension/ Angina  meTOProlol succinate ER tablet,  ( Toprol XL)  50 mg Oral daily  - Indication: Antihypertensive/ Angina  Instructions:  - DO NOT CRUSH  Simvastatin tablet, ( Zocor)  20 mg Oral at bedtime  - Indication: Hypercholesterolemia  Nursing Saline Flush, 3 to 6 ml, IV push, Q1M PRN for IV Maintenance  Aspirin Enteric Coated tablet,  ( Ecotrin)  325 mg Oral daily  - Indication: Pain/Fever/Thromboembolic Disorders/Post MI/Prophylaxis MI  Instructions:  Initiate Bleeding Precautions Protocol--DO NOT CRUSH  Allergies:  Morphine: N/V  Tape: Rash  Other -Explain in Comment Field: Other  Band Aids: Unknown  Vital Signs: **Vital Signs.:   04-Sep-14 04:20  Vital Signs Type Routine  Temperature Temperature (F) 98.3  Celsius 36.8  Temperature Source oral  Pulse Pulse 55  Respirations Respirations 18  Systolic BP Systolic BP 546  Diastolic BP (mmHg) Diastolic BP (mmHg) 71  Mean BP 102  Pulse Ox % Pulse Ox % 93  Pulse Ox Activity Level  At rest  Oxygen Delivery Room Air/ 21 %   EXAM: PHYSICAL EXAMINATION  GENERAL:  Pleasant and in NAD.  Normocephalic and atraumatic.  EYES: Funduscopic exam shows normal disc size, appearance and C/D ratio without clear evidence of papilledema.  CARDIOVASCULAR: S1 and S2 sounds are within normal limits, without murmurs, gallops, or rubs.  MUSCULOSKELETAL: Bulk - Normal Tone - Normal Pronator Drift - RUE pronator drift present. Ambulation - Gait and station are unsteady due to RLE weakness.  R/L 4/5    Shoulder abduction (deltoid/supraspinatus, axillary/suprascapular n, C5) 4/5    Elbow flexion (biceps brachii, musculoskeletal n, C5-6) 4/5    Elbow extension (triceps, radial n, C7) 4/5    Finger adduction (interossei, ulnar n, T1)   4/5    Hip flexion (iliopsoas, L1/L2) 4/5    Knee flexion (hamstrings, sciatic n, L5/S1) 4/5     Knee extension (quadriceps, femoral n, L3/4) 4/5    Ankle dorsiflexion (tibialis anterior, deep fibular n, L4/5) 4/5    Ankle plantarflexion (gastroc, tibial n, S1)  NEUROLOGICAL: MENTAL STATUS: Patient is oriented to person, place and time.  Recent and remote memory are intact.  Attention span and concentration are intact.  Naming, repetition, comprehension and expressive speech are within normal limits.  Patient's fund of knowledge is within normal limits for educational level.  CRANIAL NERVES: Normal    CN II (normal visual acuity and visual fields) Normal    CN III, IV, VI (extraocular muscles are intact) Normal    CN V (facial sensation is intact bilaterally) Normal    CN VII (facial strength is intact bilaterally, no facial droop per my assessment and family agrees) Normal    CN VIII (hearing is intact bilaterally) Normal    CN IX/X (palate elevates midline, normal phonation) Normal  CN XI (shoulder shrug strength is normal and symmetric) Normal    CN XII (tongue protrudes midline)   SENSATION: Intact to pain and temp bilaterally (spinothalamic tracts) Intact to position and vibration bilaterally (dorsal columns)   REFLEXES: R/L 2+/2+    Biceps 2+/2+    Brachioradialis   2+/2+    Patellar 2+/2+    Achilles   COORDINATION/CEREBELLAR: Finger to nose testing is within normal limits.  Lab Results:  Thyroid:  03-Sep-14 19:35   Thyroid Stimulating Hormone 1.86 (0.45-4.50 (International Unit)  ----------------------- Pregnant patients have  different reference  ranges for TSH:  - - - - - - - - - -  Pregnant, first trimetser:  0.36 - 2.50 uIU/mL)  Hepatic:  03-Sep-14 19:35   Bilirubin, Total 0.8  Alkaline Phosphatase 135  SGPT (ALT) 37  SGOT (AST)  40  Total Protein, Serum 7.1  Albumin, Serum  3.1  Routine Chem:  04-Sep-14 03:59   Glucose, Serum  100  BUN 12  Creatinine (comp) 0.82  Sodium, Serum 140  Potassium, Serum 3.7  Chloride, Serum 107  CO2, Serum  26  Calcium (Total), Serum 9.0  Anion Gap 7  Osmolality (calc) 279  eGFR (African American) >60  eGFR (Non-African American) >60 (eGFR values <1mL/min/1.73 m2 may be an indication of chronic kidney disease (CKD). Calculated eGFR is useful in patients with stable renal function. The eGFR calculation will not be reliable in acutely ill patients when serum creatinine is changing rapidly. It is not useful in  patients on dialysis. The eGFR calculation may not be applicable to patients at the low and high extremes of body sizes, pregnant women, and vegetarians.)  Cholesterol, Serum 184  Triglycerides, Serum 57  HDL (INHOUSE) 52  VLDL Cholesterol Calculated 11  LDL Cholesterol Calculated  121 (Result(s) reported on 29 Nov 2012 at 04:44AM.)  Cardiac:  04-Sep-14 03:59   Troponin I < 0.02 (0.00-0.05 0.05 ng/mL or less: NEGATIVE  Repeat testing in 3-6 hrs  if clinically indicated. >0.05 ng/mL: POTENTIAL  MYOCARDIAL INJURY. Repeat  testing in 3-6 hrs if  clinically indicated. NOTE: An increase or decrease  of 30% or more on serial  testing suggests a  clinically important change)  Routine Coag:  04-Sep-14 03:59   Prothrombin 14.4  INR 1.1 (INR reference interval applies to patients on anticoagulant therapy. A single INR therapeutic range for coumarins is not optimal for all indications; however, the suggested range for most indications is 2.0 - 3.0. Exceptions to the INR Reference Range may include: Prosthetic heart valves, acute myocardial infarction, prevention of myocardial infarction, and combinations of aspirin and anticoagulant. The need for a higher or lower target INR must be assessed individually. Reference: The Pharmacology and Management of the Vitamin K  antagonists: the seventh ACCP Conference on Antithrombotic and Thrombolytic Therapy. UUVOZ.3664 Sept:126 (3suppl): [id number]. A HCT value >55% may artifactually increase the PT.  In one study,  the increase was an  average of 25%. Reference:  "Effect on Routine and Special Coagulation Testing Values of Citrate Anticoagulant Adjustment in Patients with High HCT Values." American Journal of Clinical Pathology 2006;126:400-405.)  Activated PTT (APTT) 34.7 (A HCT value >55% may artifactually increase the APTT. In one study, the increase was an average of 19%. Reference: "Effect on Routine and Special Coagulation Testing Values of Citrate Anticoagulant Adjustment in Patients with High HCT Values." American Journal of Clinical Pathology 2006;126:400-405.)  Routine Hem:  03-Sep-14 19:35   Erythrocyte Sed Rate  31 (Result(s) reported on  28 Nov 2012 at 07:58PM.)  04-Sep-14 03:59   WBC (CBC) 4.6  RBC (CBC) 4.20  Hemoglobin (CBC)  11.9  Hematocrit (CBC)  34.8  Platelet Count (CBC)  70  MCV 83  MCH 28.4  MCHC 34.2  RDW  16.1  Neutrophil % 59.5  Lymphocyte % 24.2  Monocyte % 12.3  Eosinophil % 2.9  Basophil % 1.1  Neutrophil # 2.7  Lymphocyte # 1.1  Monocyte # 0.6  Eosinophil # 0.1  Basophil # 0.1 (Result(s) reported on 29 Nov 2012 at 04:29AM.)   Radiology Results: [place]:    04-Sep-14 11:51, US Carotid Doppler Bilateral  US Carotid Doppler Bilateral   REASON FOR EXAM:    cva  COMMENTS:       PROCEDURE: [place]  - US CAROTID DOPPLER BILATERAL  - Nov 29 2012 11:51AM     RESULT: Carotid Doppler interrogation demonstrates atherosclerotic plaque   in the carotid bulb regions bilaterally extending into the proximal right   internal carotid and proximal left internal carotid without visual   evidence of significant stenosis. There is no ulceration. The color and   spectral Doppler appearance is normal area the peak systolic velocities   are normal bilaterally in the carotid systems. The internal to common   carotid peak systolic velocity ratios are normal at 1.06 on the right and   0.79 normal left.    IMPRESSION:   1. Scattered atherosclerotic plaque without evidence of hemodynamically   significant  stenosis.    Dictation Site: 2        Verified By: Sundra Aland, M.D., MD  MRI:    04-Sep-14 16:12, MRI Brain Without Contrast  MRI Brain Without Contrast   REASON FOR EXAM:    cva  COMMENTS:       PROCEDURE: MR  - MR BRAIN WO CONTRAST  - Nov 29 2012  4:12PM     RESULT: History: Weakness    Technique: Multiplanar, multisequence MRI of the brain was obtained   without IV contrast.     Comparison:  None    Findings:     There is a small area of restricted diffusion in the left basal ganglia     and left corona radiata with corresponding ADC hypointensity most   concerning for an acute infarct. There is no hemorrhage. There is no   pathologic extra-axial fluid collection. There is generalized cerebral   atrophy. There is periventricular and deep white matter T2 and FLAIR   hyperintensity likely secondary to microangiopathy. There is no   hydrocephalus. The ventricles are normal for age. The basal cisterns are   patent. There are no abnormal extra-axial fluid collections.     The visualized paranasal sinuses and mastoid sinuses are clear. The skull   base and calvarium demonstrate normal signal. The major intracranial flow   voids, including dural venous sinuses appear patent.    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    Dictation Site: 1        Verified By: Jennette Banker, M.D., MD  CT:    03-Sep-14 18:22, CT Head Without Contrast  CT Head Without Contrast   REASON FOR EXAM:    cva  COMMENTS:       PROCEDURE: CT  - CT HEAD WITHOUT CONTRAST  - Nov 28 2012  6:22PM     RESULT: Physical CVA.  Comparison Study: No recent.    Findings: Standard nonenhanced CT obtained. No mass. Ventriculomegaly is   noted. Although this could be related to atrophy, a process such as   normal pressure hydrocephalus cannot be excluded. No bleed. Vascular   calcification. Orbits unremarkable. No acute  bony abnormality ;paranasal   sinuses where visualized are normal. Mastoids are clear.  IMPRESSION:  Ventriculomegaly. Although this may be related to atrophy, a   process such as normal pressure hydrocephalus cannot be excluded.        Verified By: Osa Craver, M.D., MD   Impression/Recommendations: Recommendations:   74 year old woman with a complex medical history including CABG, HTN, HLP and liver cirrhosis presents with symptoms of abrupt onset right leg and arm weakness.  Focal weakness in RUE and RLE consistent with acute stroke, discussed with family at length.  Patient is doing well overall, though some difficulty with ambulation.   STROKEContinue frequent neuro checks (Q4h) during hospitalization.  Stroke is very small so low chance for significant swelling/edema.Please call neuro for any changes in neuro exam from above exam with particular attention to pupillary anisocoria.Brain MRI/A - confirms left hemispheric stroke, left BG and corona radiataCarotid doppler - negative for carotid stenosisTTE with bubble study to evaluate for intracardiac shunts or cardiac thrombiMonitor on telemetry to eval for afibCheck fasting lipid panel, TSH and HgbA1cCycle cardiac enzymes Cardiac monitoring as described above.Antiplatelet therapy: agree with restarting aspirin 81 mg PO daily TREATMENT:Per ATP III guidlines, given that patient has additional stroke risk factors, start or increase statin if LDL > 70.Her fasting LDL seen to be 121 so rec starting statin for her. PRESSURE CONTROL:BPs running a little high in 160s, unclear if this is her normal range or this represents reflex post stroke hypertension to perfuse penumbra.During the first several hours after stroke, control for factors such as anxiety/stress/pain. When these factors are controlled, if blood pressure remains higher than 220/110, treat with a short acting agent (IV labetalol)Allow permissive HTN for BP less than 220/110Restart home  antihypertensive medications prior to discharge. WALKING:PT, OT - RLE weakness causing difficulty with ambulation, will require PT and OT as inpatient and upon discharge.Was strict NPO til she was cleared by bedside swallow.Fall PrecautionsDoes not smoke.DVT prophylaxis with Morris lovenox.  have reviewed the results of the most recent imaging studies, tests and labs as outlined above and answered all related questions.   Electronic Signatures: Anabel Bene (MD)  (Signed 05-Sep-14 08:53)  Authored: REFERRING PHYSICIAN, Primary Care Physician, Consult, History of Present Illness, Review of Systems, PAST MEDICAL/SURGICAL HISTORY, HOME MEDICATIONS, Current Medications, ALLERGIES, NURSING VITAL SIGNS, Physical Exam-, LAB RESULTS, RADIOLOGY RESULTS, Recommendations   Last Updated: 05-Sep-14 08:53 by Anabel Bene (MD)

## 2014-07-19 NOTE — Op Note (Signed)
PATIENT NAME:  Theresa, [name] MR#:  [id number] DATE OF BIRTH:  1940-11-10  DATE OF PROCEDURE:  02/01/2014  PREOPERATIVE DIAGNOSIS: Bleeding internal hemorrhoids.   POSTOPERATIVE DIAGNOSIS: Bleeding internal hemorrhoids.    OPERATION:  1.  Insertion right internal jugular central line.  2.  Rectal exam under anesthesia.  3.  Hemorrhoidectomy.   SURGEON: Rodena Goldmann, MD   ANESTHESIA: General.   OPERATIVE PROCEDURE: With the patient in the supine position, after the induction of appropriate general anesthesia, the patient was placed in the feet up/head down position. Her right neck was interrogated with the ultrasound. Large compressible non thrombosed jugular vein was identified. The neck was prepped sterilely and draped with sterile towels. Using the ultrasound guidance, the vein was cannulated on a single pass, and a wire passed into the great vessel system under ultrasound guidance. The needle was withdrawn. The skin incision enlarged. A dilator placed over the wire. The dilator removed. The triple-lumen catheter inserted over the wire into the great vessel system. It was flushed and capped, and secured with 3-0 silk. The patient was then placed in lithotomy position, and transfusion begun.   The rectum was inspected. There were several large protruding internal hemorrhoids. There were no significant external hemorrhoids. The rectum was prepped with Betadine and draped with sterile towels. Gentle manipulation with a bivalve retractor exposed a large posterior midline hemorrhoid, which was very friable and bleeding with minimal touch. I elected to proceed with a formal hemorrhoidectomy. Using the LigaSure device, the hemorrhoid was divided at its base and passed off the table. There was a second hemorrhoid at approximately the 8 o'clock position, treated in a similar manner. There was a large hemorrhoid, which was not bleeding at the 12 o'clock position, which appeared to be prolapsing; it too was  divided with the LigaSure and removed. All 3 tracts were oversewn with 2-0 Vicryl. The area was irrigated. No other bleeding was identified. A rectal plug of Gelfoam and Avitene was inserted into the rectal space. A sterile dressing was applied.   The patient was returned to the recovery room, having tolerated the procedure well.   Sponge, instrument and needle counts were correct x 2 in the Operating Room.    ____________________________ Rodena Goldmann III, MD rle:MT D: 02/01/2014 15:53:17 ET T: 02/01/2014 16:34:51 ET JOB#: [id number]  cc: Micheline Maze, MD, <Dictator> Rodena Goldmann MD ELECTRONICALLY SIGNED 02/05/2014 13:48

## 2014-07-19 NOTE — Consult Note (Signed)
PATIENT NAME:  Theresa [name], FENTRESS MR#:  [id number] DATE OF BIRTH:  1940/05/26  DATE OF CONSULTATION:  01/30/2014  REFERRING PHYSICIAN:  Tracie Harrier, MD CONSULTING PHYSICIAN: Lucilla Lame, MD / Andria Meuse, NP  GASTROENTEROLOGIST: Gaylyn Cheers, MD  REASON FOR CONSULTATION: Bright red blood per rectum.   HISTORY OF PRESENT ILLNESS: Theresa [name] is a 74 year old Caucasian female patient of Dr. Percell Boston with history of NASH cirrhosis, chronic iron deficiency anemia, colonic AVMs, portal hypertensive gastropathy, chronic gastritis, grade 1 esophageal varices, colonic polyps, sigmoid diverticulosis and internal hemorrhoids. She has history of hepatic encephalopathy and takes lactulose daily. She states that over the last 10 days s he has been noticing large amounts of bright red blood which fill the commode with her bowel movements. She has noticed some small clots as well. She is having at least 3 loose bowel movements per day with her doses of lactulose. She denies any abdominal pain, heartburn, indigestion, dysphagia or odynophagia. She denies any diarrhea or constipation. Her appetite has been fine and her weight has been stable. She has history of NASH cirrhosis, is on rifaximin and lactulose for hepatic encephalopathy. She has chronic iron deficiency anemia and was previously treated with parenteral iron. She also has (MGUS) monoclonal gammopathy of uncertain significance, followed by the [place]. Her last colonoscopy and EGD was by Dr. Vira Agar October 24, 2013 where she was found to have grade 1 esophageal varices, chronic non-H. pylori gastritis, and portal hypertensive gastropathy with regenerative changes of the duodenum. During her colonoscopy, she had 4 hepatic ascending colon polyps removed that were treated with thermal therapy and benign, she had multiple colonic AVMs treated with thermal therapy, granularity in the distal rectum with benign biopsies, sigmoid diverticulosis, and internal  hemorrhoids.   She is on aspirin 325 mg daily with history of CABG in 2000 and when she decreased to 81 mg daily a year ago she had a CVA and her aspirin was increased back to 325 mg daily. She does take Prilosec 20 mg daily. Her hemoglobin was 12.5 on 12/09/2013 and hemoglobin today is 9.2. Her platelets are 110,000. Her hemoglobin typically ranges 11 to 12 range. She had negative stool culture and C. diff PCR. She does have some shortness of breath on exertion and occasionally has some dizziness that is transient. In October 2014, she had hemorrhoidal banding.   PAST MEDICAL AND SURGICAL HISTORY: NASH cirrhosis with subsequent thrombocytopenia, portal hypertensive gastropathy, grade 1 esophageal varices, colonic AVMs, coronary artery disease, CVA, hypertension, hyperlipidemia, chronic GERD, non-H. pylori gastritis, degenerative joint disease, chronic low back pain, status post left knee replacement, MGUS, and hypothyroidism.   MEDICATIONS PRIOR TO ADMISSION: Amlodipine 5 mg daily, aspirin 325 mg daily, calcium 600+ vitamin D 200 international units b.i.d., folic acid 0.4 mg daily, lactulose 15 mL daily, lisinopril 40 mg daily, metoprolol 50 mg extended release daily, omeprazole 20 mg daily, simvastatin 20 mg daily, Synthroid 50 mcg daily, vitamin C 1 gram daily, Xifaxan 550 mg b.i.d.   ALLERGIES: MORPHINE causes nausea and vomiting, BAND-AIDS unknown reaction and TAPE causes a rash.   FAMILY HISTORY: Father did have colon carcinoma at age 37. No history of liver disease.   SOCIAL HISTORY: There is no tobacco, alcohol or illicit drug use. She is married. 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 She has 5 grown healthy children.   REVIEW OF SYSTEMS: See HPI, otherwise negative 12 point review of  systems.  PHYSICAL EXAMINATION: VITAL SIGNS: Temp 98.3, pulse 64, respirations 18, blood pressure 124/61, O2 sat 99% on room air. BMI 36.2, weight  185.2, height 60 inches.  GENERAL: She is an obese Caucasian female who is alert, oriented, pleasant, and cooperative, in no acute distress.  HEENT: Sclerae clear, anicteric. Conjunctivae pink. Oropharynx pink and moist without any lesions.  NECK: Supple without mass or thyromegaly.  CHEST: Heart regular rate and rhythm. Normal S1 and S2 without murmurs, clicks, rubs, or gallops.  LUNGS: Clear to auscultation bilaterally.  ABDOMEN: Positive bowel sounds x4. No bruits auscultated. Abdomen is soft, nontender, and nondistended without palpable hepatomegaly. She does have splenomegaly. No rebound tenderness or guarding. Exam is limited given body habitus.  EXTREMITIES: Without clubbing or edema.  SKIN: Pale, warm and dry without any rash or jaundice.  NEUROLOGIC: Grossly intact.  MUSCULOSKELETAL: Good equal movement and strength bilaterally.  PSYCHIATRIC: Alert, cooperative. Normal mood and affect. She is accompanied by her husband today.   LABORATORY STUDIES: Glucose 171, calcium 8.3. Troponin was negative. White blood cell count 7.8.   IMPRESSION: Theresa [name] is a very pleasant 74 year old Caucasian female with 10 days of bright red rectal bleeding, about 3 episodes daily. Her hemoglobin is 9.2. Platelets 110. She has underlying history of nonalcoholic steatohepatitis cirrhosis, portal hypertensive gastropathy, grade 1 esophageal varices, colonic arteriovenous malformations last treated in July 2015 by Dr. Vira Agar, hemorrhoids, diverticulosis, hepatic encephalopathy, thrombocytopenia, iron deficiency anemia, monoclonal gammopathy of undetermined significance, and history of CABG and cerebrovascular accident requiring daily aspirin therapy. Source of her gastrointestinal bleeding is likely colonic arteriovenous malformations, although cannot rule out diverticulosis or hemorrhoids, small bowel arteriovenous malformations, or less likely upper gastrointestinal source, portal gastropathy or varices given her  stability over the past 10 days. Unfortunately, given her need for chronic aspirin therapy, her risk of gastrointestinal bleeding is high. Colonoscopy with possible intervention with Dr. Allen Norris tomorrow. I have discussed risks and benefits to include but are not limited to bleeding, infection, perforation and drug reaction. She agrees with this plan and consent will be obtained. I have discussed her care with Dr. Lucilla Lame.   PLAN: 1.  Follow hemoglobin and hematocrit and transfuse as needed to keep hemoglobin around 7 to 8 grams.  2.  Agree with proton pump inhibitor.  3.  Colonoscopy tomorrow morning with a standard prep.  4.  N.p.o. after midnight.  Thank you for allowing [place] to participate in her care.  These services were provided by Andria Meuse, NP under collaborative agreement with Dr. Lucilla Lame.  ____________________________ Andria Meuse, NP klj:sb D: 01/30/2014 10:29:42 ET T: 01/30/2014 10:57:20 ET JOB#: [id number]  cc: Andria Meuse, NP, <Dictator> Andria Meuse FNP ELECTRONICALLY SIGNED 02/14/2014 16:35

## 2014-07-19 NOTE — Consult Note (Signed)
Pt had hemorrhoidectomy yesterday by Dr. Pat Patrick. Minimal bleeding post surgery. Hgb more stable. Dr. Allen Norris will check back tomorrow. Thanks.  Electronic Signatures: Verdie Shire (MD)  (Signed on 08-Nov-15 10:28)  Authored  Last Updated: 08-Nov-15 10:28 by Verdie Shire (MD)

## 2014-07-19 NOTE — Consult Note (Signed)
Chief Complaint:  Subjective/Chief Complaint Pt denies any further bleeding.  c/o headache & malaise.   VITAL SIGNS/ANCILLARY NOTES: **Vital Signs.:   09-Nov-15 04:00  Vital Signs Type Routine  Temperature Temperature (F) 99.9  Celsius 37.7  Temperature Source oral  Pulse Pulse 73  Respirations Respirations 20  Systolic BP Systolic BP 324  Diastolic BP (mmHg) Diastolic BP (mmHg) 59  Mean BP 75  Pulse Ox % Pulse Ox % 91  Pulse Ox Activity Level  At rest  Oxygen Delivery Room Air/ 21 %    07:52  Mean BP 92   Brief Assessment:  GEN well developed, well nourished   Cardiac Regular   Respiratory normal resp effort   Gastrointestinal details normal Soft  Nontender  Nondistended  No masses palpable  Bowel sounds normal  No rebound tenderness  No gaurding   EXTR positive cyanosis/clubbing, negative edema   Additional Physical Exam Skin: pale, warm & dry   Lab Results: Routine Chem:  09-Nov-15 06:43   Glucose, Serum 93  Creatinine (comp) 1.01  Sodium, Serum 141  Potassium, Serum 3.6  Chloride, Serum  109  CO2, Serum 25  Calcium (Total), Serum  7.5  Anion Gap 7  Osmolality (calc) 282  eGFR (African American) >60  eGFR (Non-African American)  57 (eGFR values <65m/min/1.73 m2 may be an indication of chronic kidney disease (CKD). Calculated eGFR, using the MRDR Study equation, is useful in  patients with stable renal function. The eGFR calculation will not be reliable in acutely ill patients when serum creatinine is changing rapidly. It is not useful in patients on dialysis. The eGFR calculation may not be applicable to patients at the low and high extremes of body sizes, pregnant women, and vegetarians.)  Result Comment LABS - This specimen was collected through an   - indwelling catheter or arterial line.  - A minimum of 510m of blood was wasted prior    - to collecting the sample.  Interpret  - results with caution.  Result(s) reported on 03 Feb 2014 at 06:59AM.   Routine Hem:  09-Nov-15 06:43   Erythrocyte Sed Rate  63 (Result(s) reported on 03 Feb 2014 at 09:28AM.)  WBC (CBC) 7.2  RBC (CBC)  2.71  Hemoglobin (CBC)  7.7  Hematocrit (CBC)  23.1  Platelet Count (CBC)  66  MCV 85  MCH 28.5  MCHC 33.5  RDW  15.8  Neutrophil % 77.7  Lymphocyte % 11.9  Monocyte % 8.3  Eosinophil % 1.3  Basophil % 0.8  Neutrophil # 5.6  Lymphocyte #  0.9  Monocyte # 0.6  Eosinophil # 0.1  Basophil # 0.1   Assessment/Plan:  Assessment/Plan:  Assessment Rectal bleeding secondary to hemorrhoids: Resolved Anemia: secondary to #1: Hgb stable s/p colonoscopy, hemorrhoidectomy & transfusion NASH cirrhosis, portal hypertensive gastropathy, Gr 1 EV, colonic AVMs: Stable   Plan 1) [facility]) Call if any further bleeding Will sign off, Please call if you have any questions or concerns   Electronic Signatures: JoAndria MeuseNP)  (Signed [id number])  Authored: Chief Complaint, VITAL SIGNS/ANCILLARY NOTES, Brief Assessment, Lab Results, Assessment/Plan   Last Updated: 09-Nov-15 13:45 by JoAndria MeuseNP)

## 2014-07-19 NOTE — Consult Note (Signed)
PATIENT NAME:  Theresa [name], Theresa [name] MR#:  [id number] DATE OF BIRTH:  02-Oct-1940  DATE OF CONSULTATION:  01/31/2014  REFERRING PHYSICIAN:   CONSULTING PHYSICIAN:  Jerrol Banana. Burt Knack, MD  CHIEF COMPLAINT: Rectal bleeding.   HISTORY OF PRESENT ILLNESS: This is a patient who has had 10 days of rectal bleeding, blood in her stool, and some frank bleeding. She states that after her colonoscopy today she has continued to bleed but is not clear about how much. She is not dizzy or lightheaded. She has had episodes like this before. In fact, she has had 2 internal hemorrhoid banding procedures, most recently a year ago by Dr. Rochel Brome. She was seen by Dr. Allen Norris who performed a colonoscopy today and identified bleeding hemorrhoidal tissue and some AVMs.   PAST MEDICAL HISTORY: Hypertension, hyperlipidemia, reflux disease, hypothyroidism.   PAST SURGICAL HISTORY: Hemorrhoidal bandings, coronary artery bypass graft, knee replacements.   FAMILY HISTORY: Noncontributory.   SOCIAL HISTORY: The patient does not smoke or drink.   REVIEW OF SYSTEMS: Ten system review was performed and negative with the exception I mentioned in the HPI.   PHYSICAL EXAMINATION:  GENERAL: Healthy, obese female patient, BMI of 36.  VITAL SIGNS: Temperature of 97.8, pulse of 66, respirations 19, blood pressure 133/68.  HEENT: Shows no scleral icterus.  NECK: No palpable neck nodes.  ABDOMEN: Soft and nontender.  EXTREMITIES: Show minimal edema.  NEUROLOGIC: Grossly intact.  INTEGUMENT: No jaundice.  RECTAL: Exam demonstrates blood. No active bleeding. Some external tags and some palpable internal hemorrhoids.   LABORATORY DATA: H and H are reviewed. No labs were drawn yesterday; however, there is a decrease from the day of admission where her H and H were 9.2 and 27 with a platelet count of 110,000, and then today her H and H are 8.4 and 24.4 with a platelet count of 112,000.   ASSESSMENT AND PLAN: This is a patient with  probable internal hemorrhoids seen on colonoscopy and confirmed. She has had prior banding by Dr. Rochel Brome, most recently a year ago. She is not actively bleeding at this time, but because of her 10-day history and the findings at colonoscopy I would recommend examination under anesthesia and possible hemorrhoidectomy or ligature. This was discussed with Dr. Pat Patrick and will be discussed again with him by findings as he will be performing the procedure tomorrow. The patient is currently stable. There is no sign of active bleeding and no need for emergent examination under anesthesia. The patient and family were understanding of this plan and agreed.  I also offered to have her see Dr. Rochel Brome as an alternative as she has seen him before. She wishes to have this done expeditiously and was happy with our care.    ____________________________ Jerrol Banana. Burt Knack, MD rec:ts D: 01/31/2014 22:42:55 ET T: 02/01/2014 01:00:23 ET JOB#: [id number]  cc: Jerrol Banana. Burt Knack, MD, <Dictator> Florene Glen MD ELECTRONICALLY SIGNED 02/01/2014 7:06

## 2014-07-19 NOTE — Discharge Summary (Signed)
PATIENT NAME:  Theresa [name], TIMPONE MR#:  [id number] DATE OF BIRTH:  May 02, 1940  DATE OF ADMISSION:  01/29/2014 DATE OF DISCHARGE:    DIAGNOSES AT THE TIME OF DISCHARGE:  1.  Hematochezia secondary to bleeding hemorrhoids, status post hemorrhoidectomy.  2.  History of hypertension.  3.  Hyperlipidemia.  4.  Hypothyroidism.  5.  Klebsiella urinary tract infection.   CHIEF COMPLAINT: Bright red blood per rectum.   HISTORY OF PRESENT ILLNESS: Nashiya Disbrow is a 74 year old female with a history of cirrhosis, CAD, status post previous bypass surgery, hypertension and hyperlipidemia who presents to the ED with a 9 day history of bright red blood per rectum. The patient also has been complaining of dyspnea on exertion associated with lightheadedness. She denies any abdominal pain.   PAST MEDICAL HISTORY: Significant for CAD, status post CABG, hypertension, hyperlipidemia, GERD, osteoarthritis, obstructive sleep apnea syndrome, cirrhosis of the liver and hypothyroidism. Please see H and P for full details. The patient was admitted to [facility] and underwent a colonoscopy, which showed 2 nonbleeding colonic angiectasia that were treated with argon beam coagulation. There was also a single nonbleeding colonic angiectasia treated with argon beam coagulation and there was evidence of bleeding external hemorrhoids. The patient subsequently underwent hemorrhoidectomy. Hemoglobin did drop to 6.7 prior to surgery and received 1 unit of blood. The patient did well postoperatively. She also had a urinary tract infection with more than 100,000 CFU Klebsiella pneumonia and was prescribed ciprofloxacin for this. The patient was discharged in stable condition on the following medications.   DISCHARGE MEDICATIONS: Synthroid 50 mcg once a day, calcium plus D 600/200 international units 1 tablet b.i.d., folic acid 0.4 mg once a day, lisinopril 40 mg once a day, omeprazole 20 mg once a day, simvastatin 20 mg once a day, metoprolol  succinate 50 mg once a day, rifaximin 550 mg b.i.d., amlodipine 5 mg once a day, ascorbic acid 1000 mg once a day, lactulose 15 mL q. day p.r.n., iron polysaccharide 150 mg 1 capsule b.i.d. and Cipro 500 mg p.o. b.i.d. for 7 days.   FOLLOWUP: The patient has been advised to follow up with me, Dr. Ginette Pitman, and also follow up with Dr. Pat Patrick in 1-2 weeks' time. He has been advised to have a repeat CBC done in 1-2 weeks and advised to call back with any questions or concerns.   TOTAL TIME SPENT IN DISCHARGING THE PATIENT: Thirty-five minutes.    ____________________________ Tracie Harrier, MD vh:TT D: 02/03/2014 13:16:00 ET T: 02/03/2014 14:19:11 ET JOB#: [id number]  cc: Tracie Harrier, MD, <Dictator> Tracie Harrier MD ELECTRONICALLY SIGNED 02/05/2014 13:09

## 2014-07-19 NOTE — H&P (Signed)
PATIENT NAME:  Theresa [name], Theresa [name] MR#:  [id number] DATE OF BIRTH:  September 06, 1940  REFERRING PHYSICIAN: Ahmed Prima, MD  PRIMARY CARE PHYSICIAN:  Tracie Harrier, MD, [facility]  GASTROENTEROLOGIST:  Manya Silvas, MD  CHIEF COMPLAINT: Bright red blood per rectum.   HISTORY OF PRESENT ILLNESS: A 74 year old Caucasian female with a history of cirrhosis, coronary artery disease, status post CABG, hypertension, hyperlipidemia, presented with bright red blood per rectum. She describes a 9 day total duration bright red blood per rectum. She initially described her episodes of blood coating stool, now having frank bleeding. She actually only had 1 episode thus far today, the day of admission; however, she is now having dyspnea on exertion as well as lightheadedness. Denies any palpitations or chest pain. Also denies any abdominal pain. Because she is now symptomatic decided to present to the hospital for further workup and evaluation.  REVIEW OF SYSTEMS:   CONSTITUTIONAL: Denies fevers, chills. Positive for fatigue, weakness.  EYES: Denies blurred vision, double vision, or eye pain.  EARS, NOSE, THROAT: Denies tinnitus, ear pain, hearing loss.  RESPIRATORY: Denies cough, wheeze. Positive for shortness of breath on exertion. CARDIOVASCULAR: Denies chest pain, palpitations, edema. GASTROINTESTINAL: She also denied nausea, vomiting, diarrhea, or abdominal pain.  Positive, however, for hematochezia.  Denies any melena.  GENITOURINARY: Denies dysuria, hematuria.  ENDOCRINE: Denies nocturia or thyroid problems. HEMATOLOGY AND LYMPHATIC: Denies easy bruising.  Positive for bleeding as described above.  SKIN:  Denies rash or lesion.  MUSCULOSKELETAL: Denies pain in neck, back, shoulder, knees, hips or arthritic symptoms. NEUROLOGIC: Denies paralysis, paresthesias.  PSYCHIATRIC: Denies anxiety or depressive symptoms. Otherwise, full review of systems performed and is negative.   PAST MEDICAL  HISTORY: Coronary artery disease, status post CABG, hypertension, hyperlipidemia, gastroesophageal reflux disease, osteoarthritis, obstructive sleep apnea requiring CPAP therapy which she is somewhat noncompliant, cirrhosis of unknown etiology, as well as hypothyroidism.   SOCIAL HISTORY: Denies any alcohol, tobacco, or drug use. She has had blood transfusions in the past.   FAMILY HISTORY: Positive for coronary artery disease.   ALLERGIES: MORPHINE, BAND-AIDS, AND TAPE.   HOME MEDICATIONS: Include aspirin 325 mg p.o. daily, lisinopril 40 mg p.o. daily, simvastatin 20 mg p.o. at bedtime, metoprolol succinate 50 mg p.o. daily, Norvasc 5 mg p.o. daily,  lactulose 15 mL a day, Xifaxan 550 mg p.o. b.i.d., Prilosec 20 mg p.o. daily,  Synthroid 50 mcg p.o. daily, calcium 600+ vitamin D 200 International Units p.o. b.i.d., folic acid 0.4 mg p.o. daily, vitamin C 1000 mg p.o. daily.   PHYSICAL EXAMINATION: VITAL SIGNS: Temperature 97.1, heart 64, respirations 16, blood pressure 155/67, saturating 100% on room air. Weight 83.9 kg, BMI of 36.1.  GENERAL: Well-nourished, well-developed Caucasian female, currently in no acute distress.  HEAD: Normocephalic, atraumatic.  EYES: Pupils equal, round and reactive to light. Extraocular muscles intact. No scleral icterus.  However, does have pale conjunctivae. MOUTH: Moist mucous membranes. Dentition intact. No abscess noted.  EARS, NOSE, THROAT: Clear, without exudates. No external lesions. NECK: Supple. No thyromegaly. No nodules. No JVD.  PULMONARY: Clear to auscultation bilaterally without wheezes, rales, or rhonchi. No use of accessory muscles. Good respiratory effort.  CHEST: Nontender to palpation. CARDIOVASCULAR: S1, S2, regular rate and rhythm. No murmurs, rubs, or gallops. No edema. Pedal pulses 2+ bilaterally.  GASTROINTESTINAL: Soft, nontender, nondistended, no masses palpable. Positive bowel sounds. No hepatosplenomegaly.  MUSCULOSKELETAL: No  swelling, clubbing, or edema. Range of motion full in all extremities.  NEUROLOGIC: Cranial nerves II  through XII intact. No gross focal neurological deficits. Sensation intact. Reflexes intact.  SKIN: No ulceration, lesion, rash, or cyanosis.  Skin warm, dry, turgor intact.  PSYCHIATRIC: Mood and affect within normal limits. The patient awake, alert and oriented x 3. Insight and judgment intact.   LABORATORY DATA: Sodium 138, potassium 3.6, chloride 102, bicarbonate 29, BUN 14, creatinine 1.16, glucose 171, WBC of 7.8, hemoglobin 9.2, MCV of 84, RDW of 16.7, platelets of 110,000.   ASSESSMENT AND PLAN: A 74 year old Caucasian female with a history of cirrhosis, coronary artery disease status post coronary artery bypass graft presented with bright red blood per rectum.  1.  Hematochezia. Place on n.p.o. status. Trend CBCs q. 6 hours. Transfusion threshold hemoglobin less than 7 or symptomatic. We will consult gastroenterology. Recently followed with Dr. Vira Agar at that time back in July having colonoscopy as well as endoscopy performed. Endoscopy revealed some gastritis, otherwise no acute findings. Grade 1 esophageal varices also noted.  On colonoscopy she had multiple diverticula as well as angiectasias which were treated with thermal therapy.  There was also mentioned of a small internal hemorrhoid which was not bleeding.  Will also hold her aspirin therapy.  2.  Hypertension. Continue Norvasc, lisinopril, Toprol XL.  3.  Hyperlipidemia, on statin therapy.  4.  Gastroesophageal reflux disease on proton pump inhibitor therapy.  5.  Hypothyroidism, on Synthroid. 6.  Venous thromboembolism prophylaxis, on sequential compression devices.  CODE STATUS: The patient is a full code.   TIME SPENT: 45 minutes    ____________________________ Aaron Mose. Venisha Boehning, MD dkh:LT D: 01/29/2014 22:08:04 ET T: 01/29/2014 22:55:34 ET JOB#: [id number]  cc: Aaron Mose. Annora Guderian, MD, <Dictator> Toshiye Kever Woodfin Ganja  MD ELECTRONICALLY SIGNED 01/31/2014 3:04

## 2014-07-19 NOTE — Consult Note (Signed)
Covering for Dr. Allen Norris. Pt denies further rectal bleding since colonscopy. However, hgb down to 6.7. Has had significant hemorrhoidal bleeding.  Pt go to surgery later this AM. Will check back tomorrow AM post surgery.  Electronic Signatures: Verdie Shire (MD)  (Signed on 07-Nov-15 09:54)  Authored  Last Updated: 07-Nov-15 09:54 by Verdie Shire (MD)

## 2014-07-19 NOTE — Consult Note (Signed)
Brief Consult Note: Diagnosis: blleding internal hemorrhoids.   Patient was seen by consultant.   Consult note dictated.   Recommend to proceed with surgery or procedure.   Orders entered.   Comments: ten dasys of bleeding, none now. prior banding most recently one year ago. rec EUA and poss hemorroidectomy risks and options discussed with pt family.  Electronic Signatures: Florene Glen (MD)  (Signed [phone] 22:03)  Authored: Brief Consult Note   Last Updated: 06-Nov-15 22:03 by Florene Glen (MD)

## 2014-07-19 NOTE — Consult Note (Signed)
Brief Consult Note: Diagnosis: bright red rectal bleeding, NASH cirrhosis, Hx colonic AVMS.   Patient was seen by consultant.   Comments: Theresa [name] is a very pleasant 74 y/o caucasian female with 10 days of bright red rectal bleeding about 3 episodes daily.  Hgb is 9.2, platelets 110.  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  Source of her GI bleeding is likely colonic AVMs, although cannot r/o diverticulosis or hemorrhoids, small bowel AVMS, or less likely upper GI source portal gastropathy or varices given her stability over past 10 days.  Unfortunately, given her need for chronic ASA therapy, her risk of GI bleeding is high.  Colonoscopy with possible intervention with Dr Allen Norris tomorrow.  Discussed risks/benefits of procedure which include but are not limited to bleeding, infection, perforation & drug reaction.  Patient agrees with this plan & consent will be obtained.  I have discussed her care with Dr Evangeline Gula [facility] & our plan of care is below.  Plan: 1) Follow H/H & transfuse PRN to keep hgb around 7-8 grams 2) Agree with PPI 3) colonoscopy tomorrow with standard prep 4) NPO after MN Thanks for allowing [place] to participate in her care.  Please see full dictated note. #[id number].  Electronic Signatures: Theresa [name] (NP)  (Signed [phone] 10:28)  Authored: Brief Consult Note   Last Updated: 05-Nov-15 10:28 by Theresa [name] (NP)

## 2014-07-20 NOTE — Consult Note (Signed)
Pt with epigastric abd pain and tenderness, rectal bleed first of large amount BRB then 4 passages of dark stool.  Hx of AVM and diverticulosis of colon.  Will do EGD tomorrow and consider colonoscopy for later.  Electronic Signatures: Manya Silvas (MD)  (Signed on 25-Feb-13 18:50)  Authored  Last Updated: 25-Feb-13 18:50 by Manya Silvas (MD)

## 2014-07-20 NOTE — Discharge Summary (Signed)
PATIENT NAME:  Theresa [name], Theresa [name] MR#:  [id number] DATE OF BIRTH:  04/06/1940  DATE OF ADMISSION:  05/23/2011 DATE OF DISCHARGE:  05/25/2011  DISCHARGE DIAGNOSES:  1. Rectal bleed with acute blood loss anemia, most likely secondary to diverticulitis, possible arteriovenous malformation.  2. History of osteoarthritis.  3. Hypertension.  4. Hypothyroidism.  5. History of MGUS. 6. History of coronary artery disease.   CHIEF COMPLAINT: Bright red blood per rectum, weakness, dizziness, and abdominal cramps.   HISTORY OF PRESENT ILLNESS: Theresa [name] is a 74 year old female with history of coronary artery disease, hypothyroidism, gastroesophageal reflux disease, and MGUS who presented to the clinic complaining of feeling dizzy associated with upper abdominal pain and passage of gas. The patient also states that she had an episode of bright red blood per rectum that practically filled the commode and she has been having further loose stools since then.   PAST MEDICAL HISTORY:  1. Coronary artery disease.  2. Hypertension.  3. Hyperlipidemia.  4. Gastroesophageal reflux disease.  5. Osteoarthritis.  6. Monoclonal gammopathy. 7. Temporal arteritis. 8. Rheumatoid arthritis. 9. History of previous episodes of GI bleed.  10. She had a colonoscopy done 12/2009 which showed evidence for a polyp in the rectum and also diverticulosis in the sigmoid colon and descending colon. She had multiple small and large angiectasias in the descending colon and the hepatic flexure.  11. Coronary artery bypass graft.  12. Right arthroscopic surgery.  13. Left knee total knee replacement.   PHYSICAL EXAMINATION:  GENERAL: She was in moderate distress. Mucosa was pale and dry. She appeared weak. VITAL SIGNS: Blood pressure 148/80, pulse 72. HEENT: Normocephalic, atraumatic. Pupils equal and reactive to light. HEART: S1, S2. LUNGS: Clear to auscultation. ABDOMEN: Soft. Epigastric tenderness present. NEUROLOGIC: Nonfocal.   RECTAL: Large external hemorrhoid with bloodstained mucus. She is clearly heme positive.  INITIAL LABS: WBC count 5.6, hemoglobin 8.6, hematocrit 27, platelets 112, CPK 68, MB 0.8. Troponin less than 0.02, glucose 110, BUN 17, creatinine 1, sodium 143, potassium 4.1, chloride 106, CO2 25.   HOSPITAL COURSE: The patient was admitted to [facility]. She was seen in consultation by Dr. Vira Agar. 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 There was evidence of gastritis, normal antrum, normal duodenal bulb and second part of the duodenum. She was also seen by Dr. Ma Hillock, hematology/oncology specialist, and she was transfused 2 units of packed red blood cells. The patient tolerated the transfusion well. Post transfusion hemoglobin was 9.8. She was also started on IV Protonix. Her Celebrex was held. Overall the patient was symptomatically better and she was stable at the time of discharge. The patient was discharged on the following medications.   DISCHARGE MEDICATIONS:  1. Synthroid 50 mcg a day.  2. Simvastatin 20 mg a day.  3. Calcium 600 plus D 1 tablet b.i.d.  4. Folic acid 1 tablet a day. 5. Lisinopril 40 mg a day.  6. Omeprazole 20 mg a day.  7. Vitamin C 1 tablet once a day.  8. Cipro 500 mg p.o. b.i.d. for one week.  9. Flagyl 500 mg p.o. t.i.d. for one week.   FOLLOWUP: The patient has been advised to follow up with me, Dr. Ginette Pitman, and also follow up with Dr. Ma Hillock for possible further transfusions to be done as an outpatient. The patient has been advised to report to the ER or call us if she has any further episodes of bleeding.  ____________________________ Tracie Harrier, MD vh:bjt D:  05/26/2011 17:18:00 ET         T: 05/27/2011 12:01:24 ET          JOB#: [id number] Tracie Harrier MD ELECTRONICALLY SIGNED 06/10/2011 13:02

## 2014-07-20 NOTE — Consult Note (Signed)
EGD done, see op note.  Likely LGI bleed from diverticulosis, no further investigations planned unless rebleeds.  Will start clear liquids and advance as  tol.  Large spleen noted on exam yesterday.  Electronic Signatures: Manya Silvas (MD)  (Signed on 26-Feb-13 12:43)  Authored  Last Updated: 26-Feb-13 12:43 by Manya Silvas (MD)

## 2014-07-20 NOTE — Consult Note (Signed)
PATIENT NAME:  Theresa [name], Theresa [name] MR#:  [id number] DATE OF BIRTH:  [birth year]  DATE OF CONSULTATION:  05/23/2011  REFERRING PHYSICIAN:   CONSULTING PHYSICIAN:  Manya Silvas, MD  HISTORY OF PRESENT ILLNESS: Patient is a 74 year old white female who has the onset of bright red blood per rectum last night. She had a major bleed. Said she "poured blood out". It was a lot. Today she has had four bowel movements and the stool is very dark suggesting older blood. She was admitted to the hospital for GI bleeding. I was asked to see her in consultation.   PAST MEDICAL HISTORY:  1. Coronary artery disease with coronary bypass surgery in the year 2000. The last catheterization 3 or 4 years ago.  2. Hypertension.  3. Hyperlipidemia.  4. Gastroesophageal reflux disease. 5. Degenerative joint disease.  6. Chronic low back pain.  7. Previous left knee replacement.  8. Diverticulosis and arteriovenous malformation on colonoscopy 10/11/2009.    MEDICATIONS:  1. Synthroid 50 mcg a day.  2. Celebrex 200 mg a day.  3. Prilosec 20 mg a day.  4. Aspirin 81 mg a day.  5. Advair inhaler 1 puff b.i.d.  6. Lisinopril 20 mg a day.  7. Vitamin B63. 8. Folic acid 1 tablet a day.  9. Vitamin C 1 tablet a day.  10. Iron. 11. Zocor 20 mg a day.  12. Lasix 40 mg a day.   ALLERGIES: Tape, neoprene, morphine, latex, Band-Aid strips.   SOCIAL HISTORY: Never been a smoker or drinker. Father was a heavy chain smoker and she lived with him about 20 years. Thinks she may have a touch of emphysema related to that.   REVIEW OF SYSTEMS: She has had nausea but no vomiting. Nausea onset was Friday. She is not a Jehovah's Witness. She has had transfusions on at least three different occasions. No hematemesis. No chest pains. She has a little touch of bronchitis, had a severe case in early January. She denies any mouth sores. She did have cataract surgery three months ago. No nosebleeds. No changes in vision or hearing. No  chest pains. No skipping irregular heartbeats. She does have a history of gallstones. She still has her gallbladder in. No dysphagia although she occasionally chokes drinking water. She has had some loose stools recently, the last few days have been more solid. She today has had some dizziness with standing.   She is followed by Dr. Ma Hillock for hematological abnormalities including recurrent iron deficiency anemia not responding to oral iron. 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   PHYSICAL EXAMINATION:  GENERAL: White female in no acute distress.   HENT: Sclerae nonicteric. Conjunctivae negative. Tongue negative.   HEAD: Atraumatic. Trachea is in the midline.   CHEST: Clear anterolateral fields.   HEART: No murmurs or gallops I can hear.   ABDOMEN: Shows some epigastric, right lower quadrant abdominal discomfort with palpation. Spleen tip is definitely felt in left upper quadrant.   EXTREMITIES: No edema.   RECTAL: Exam done by Dr. Ma Hillock showed very heme positive.   LABORATORY, DIAGNOSTIC AND RADIOLOGICAL DATA: Lab data pending.   ASSESSMENT:  1. Given her epigastric pain, bright blood per rectum and then dark blood it is possible she could have an ulcer with GI bleeding. I am going to change her Protonix to give her 40 mg IV b.i.d. She is on omeprazole and Celebrex and 81 mg aspirin. It would be  unusual to develop bleeding on the omeprazole but it is possible. We are going to do an upper endoscopy tomorrow.  2. Given her diverticulosis, previous AVMs and now bleeding it is possible she could have bled from diverticular disease or AVM. Once her upper tract has been ruled out we probably will set up for colonoscopy the following day. Because of her enlarged spleen and other problems she has been seeing Dr. Ma Hillock and I am going to consult him also.   ____________________________ Manya Silvas,  MD rte:cms D: 05/23/2011 18:06:28 ET T: 05/24/2011 05:26:57 ET JOB#: [id number]  cc: Manya Silvas, MD, <Dictator> Tracie Harrier, MD Sandeep R. Ma Hillock, MD Manya Silvas MD ELECTRONICALLY SIGNED 06/01/2011 14:50

## 2014-07-20 NOTE — Consult Note (Signed)
HEMATOLOGY Consult note - MD: Dr.ElliottMD: Dr.Panditfor Consult: iron def anemia, h/o splenomegaly. In with GI bleed.    Chief Complaint/Diagnosis  marrow biopsy September 2010  -  RIGHT ILIAC CREST BONE MARROW BIOPSY AND CLOT SECTION:HYPERCELLUAR FOR AGE (50%) WITH SLIGHT KAPPA PREDOMINANT PLASMACYTOSIS (4% PLASMA CELLS ON TOUCH PREP, ESTIMATED AT MOST 5% ON CD138 IMMUNOHISTOCHEMICAL STAIN PERFORMED ON CORE BIOPSY).  - TRILINEAGE HEMATOPOIESIS.  - NEGATIVE FOR AMYLOID, CONGO RED STAIN EXAMINED.  Cytogenetics normal.   HISTORY OF PRESENT ILLNESS:   HPI Patient is a 70/female with known h/o iron def anemia on intermittent venofer Rxs, MGUS. Hb was 11 on 01/26/11. Patient now admitted with BRBPR last few days and Hb has dropped to 7.9. She is feeling fatigued, and is currently getting PRBC tx. Alos has h/o mild trombocytopenia, platelet count is 98K. No new dyspnea, orthopnea or PND.  Appetite is steady. No new bone pains.    Past Medical History esophageal reflux disease. low back pain, x-ray in November 2000 apparently showed minimal degenerative joint disease per records from Dr. Alexandria Lodge. shortness of breath. artery disease, status post PTCA of LAD in 1999, status post 2 vessel bypass surgery in July 2000.  At that time ejection fraction of 66%. per Dr. Lonell Grandchild note the patient had an endoscopic evaluation in 2002 and showed hyperplastic polyp.   Surgical History total hip replacement in January 2005. heart surgery in July 2000. History for heart disease, hypertension, anemia, and colon cancer.  No history of hematological disorders or myeloma in the family. History tobacco or alcohol use.    ALLERGIES:  Morphine: N/V  Tape: Rash  Other -Explain in Comment Field: Other  HOME MEDICATIONS:   lisinopril 10 mg oral tablet: 1 tab(s)  once a day , 31-Oct-12 14:58   Synthroid 50 mcg (0.05 mg) oral tablet: 1  orally once a day (in the morning) , 31-Oct-12 14:58   Prevacid 30 mg delayed release  capsule: 1 cap(s) orally 2 times a day , 31-Oct-12 14:58   simvastatin 20 mg tablet: 1 tab(s) orally once a day (at bedtime) , 31-Oct-12 14:58   Celebrex 200 mg oral capsule: 1 tab(s) orally once a day , 31-Oct-12 14:58   aspirin 81 mg oral delayed release capsule: 1 cap(s) orally once a day, 31-Oct-12 14:58   Calcium 600+D 600 mg-200 intl units oral tablet: 1 tab(s) orally 2 times a day, 73-VAP-01 41:03   folic acid 0.4 mg oral tablet: 1 tab(s) orally once a day, 31-Oct-12 14:58  ROS:   Review of Systems As in HPI above. In addition, no fevers or night sweats. No new headaches or focal weakness. No sore throat or dysphagia. No angina, dizziness or palpitation. No abdominal pain, diarrhea, dysuria or hematuria. No bleeding symptoms. No new paresthesias in extremities. No polyuria polydipsia.      Physical Exam:  GENERAL: patient is sitting in bed, tired looking, otherwise alert and oriented and in no acute distress.  No icterus. Pallor present.EOMs intact. No cervical adenopathyS1S2, regular rate and rhythmBilaterally clear to auscultation.Soft, nontender, no hepatosplenomegaly. trace pedal edema.nonfocalno rash, no major bruising    Laboratory Results:    Hb 7.9, platelet 98K, WBC 4800, Cr 1.0                                             IMPRESSION/RECOMMENDATIONS:  1. Recurrent Iron deficiency anemia -  hemoglobin and iron studies had been doing steady on maintenance IV Venofer treatments. Patient now admitted with GI bleed worsening anemia acutely. Agree with continued PRBC transfusion support as indicated, GI evaluation ongoing. She wa sadvised to keep outpatient appt at cancer center next week as already scheduled, will monitor CBC at that time. Monoclonal gammopathy of on unknown significance, chronic unexplained thrombocytopenia - bone marrow study in October 2010 was negative for amyloid, it was slightly hypocellular with 4% plasma cells, otherwise trilineage  hematopoiesis present.  SIEP in Aug 2012 was mostly unchanged with small M-spike of 0.5 g/dL.  Platelet count is slightly lower at 98K, continue to monitor and transfuse as indicated. Patient is agreeable to above plan.you for the referral. Please do not hesitate to contact me if any additional questions.      Electronic Signatures: Jonn Shingles (MD)  (Signed on 27-Feb-13 00:14)  Authored  Last Updated: 27-Feb-13 00:14 by Jonn Shingles (MD)

## 2014-07-20 NOTE — H&P (Signed)
PATIENT NAME:  Theresa [name], Theresa [name] MR#:  [id number] DATE OF BIRTH:  Jan 29, 1941  DATE OF ADMISSION:  05/23/2011  CHIEF COMPLAINT: Bright red blood per rectum, weakness, dizziness, abdominal cramps.   HISTORY OF PRESENT ILLNESS: Theresa [name] is a 73 year old female with a history of coronary artery disease, hypothyroidism, gastroesophageal reflux disease, MGUS who presented to the clinic complaining of feeling dizzy associated with upper abdominal pain and gas. She states her symptoms started about three days back. Last night she also had an episode of bright red blood per rectum and states that it filled the commode. Today she has been having further loose stools and states that the stool color is black. She also has been complaining of shortness of breath associated with dizziness, feeling weak, and difficult ambulation.   PAST MEDICAL HISTORY:  1. Coronary artery disease. 2. Hypertension. 3. Hyperlipidemia. 4. Gastroesophageal reflux disease. 5. Osteoarthritis. 6. Monoclonal gammopathy. 7. Temporal arteritis. 8. Rheumatoid arthritis.   PAST SURGICAL HISTORY:  1. Coronary artery bypass graft.  2. Left total knee replacement.  3. Right arthroscopic surgery.  4. Colonoscopy done October 2011 showed a polyp in the rectum and ascending colon with evidence for diverticulosis.   CURRENT MEDICATIONS:  1. Celebrex 200 mg 1 capsule a day.  2. Lisinopril 40 mg 1 tablet a day.  3. Omeprazole 20 mg 1 capsule daily. 4. Levothyroxine 50 mcg a day.  5. Vitamin W44. 6. Folic acid 1 tablet a day.  7. Vitamin C 1 tablet a day. 8. Iron. 9. Zocor 20 mg once a day.  10. Furosemide 40 mg once a day.   ALLERGIES: She is allergic to tape, neoprene, morphine, latex, Band-Aid strips.   PHYSICAL EXAMINATION:   GENERAL: She was in moderate distress. Mucosa was pale. Mucous membranes were dry. She appeared weak.   VITAL SIGNS: Blood pressure 148/80, pulse 72.   HEENT: Normocephalic, atraumatic. Pupils equal  and reactive to light.   NECK: No thyromegaly.   HEART: S1, S2.   LUNGS: Clear to auscultation.   ABDOMEN: Soft. Epigastric tenderness present. Bowel sounds 1+.   RECTAL: Large external hemorrhoid noted with blood stained mucus of gloved finger which was clearly heme positive.   EXTREMITIES: No edema.   NEUROLOGIC: Nonfocal.   LABORATORY, DIAGNOSTIC, AND RADIOLOGICAL DATA: Still pending.   IMPRESSION:  1. Rectal bleed with loose stools associated with weakness, dizziness, and difficulty ambulation. Will admit patient for IV fluids. Will also start her on IV Protonix. Differential diagnosis includes  upper GI bleed. from PUD vs Diverticulitis .Will place her on telemetry and check stool for WBCs, ova, parasites, and cultures. Also, consult GI.        Type and cross match  2. Coronary artery disease, currently chest pain free. History of previous bypass surgery.  3. Osteoarthritis. Will hold Celebrex at this point. 4. Hypertension. Will continue to monitor vital signs and decrease lisinopril dose to 20 mg a day.  5. Hypothyroidism. Continue Synthroid.  6. History of anemia and thrombocytopenia. Will transfuse as necessary.   Case was discussed in detail with the patient and her husband who are in agreement with the plan.   ____________________________ Tracie Harrier, MD vh:drc D: 05/23/2011 17:09:15 ET T: 05/23/2011 17:44:10 ET JOB#: [id number]  cc: Tracie Harrier, MD, <Dictator> Tracie Harrier MD ELECTRONICALLY SIGNED 06/10/2011 13:02

## 2014-07-21 LAB — SURGICAL PATHOLOGY

## 2014-08-15 DIAGNOSIS — I10 Essential (primary) hypertension: Secondary | ICD-10-CM | POA: Diagnosis present

## 2014-10-03 ENCOUNTER — Other Ambulatory Visit: Payer: Self-pay

## 2014-10-03 DIAGNOSIS — D500 Iron deficiency anemia secondary to blood loss (chronic): Secondary | ICD-10-CM | POA: Insufficient documentation

## 2014-10-03 DIAGNOSIS — D696 Thrombocytopenia, unspecified: Secondary | ICD-10-CM | POA: Insufficient documentation

## 2014-10-03 DIAGNOSIS — D509 Iron deficiency anemia, unspecified: Secondary | ICD-10-CM

## 2014-10-06 ENCOUNTER — Inpatient Hospital Stay: Payer: Medicare Other | Attending: Internal Medicine

## 2014-10-06 DIAGNOSIS — D472 Monoclonal gammopathy: Secondary | ICD-10-CM | POA: Insufficient documentation

## 2014-10-06 DIAGNOSIS — D509 Iron deficiency anemia, unspecified: Secondary | ICD-10-CM | POA: Diagnosis not present

## 2014-10-06 LAB — IRON AND TIBC
Iron: 71 ug/dL (ref 28–170)
Saturation Ratios: 23 % (ref 10.4–31.8)
TIBC: 316 ug/dL (ref 250–450)
UIBC: 245 ug/dL

## 2014-10-06 LAB — FERRITIN: Ferritin: 31 ng/mL (ref 11–307)

## 2014-10-13 ENCOUNTER — Other Ambulatory Visit: Payer: Self-pay | Admitting: Nurse Practitioner

## 2014-10-13 DIAGNOSIS — K7460 Unspecified cirrhosis of liver: Secondary | ICD-10-CM

## 2014-10-21 ENCOUNTER — Ambulatory Visit: Payer: Medicare Other

## 2014-10-31 ENCOUNTER — Ambulatory Visit: Payer: Medicare Other

## 2014-11-03 ENCOUNTER — Ambulatory Visit: Payer: Medicare Other

## 2014-11-14 ENCOUNTER — Ambulatory Visit
Admission: RE | Admit: 2014-11-14 | Discharge: 2014-11-14 | Disposition: A | Discharge status: Home / Self Care | Payer: Medicare Other | Source: Ambulatory Visit | Attending: Nurse Practitioner | Admitting: Nurse Practitioner

## 2014-11-14 DIAGNOSIS — K7460 Unspecified cirrhosis of liver: Secondary | ICD-10-CM | POA: Diagnosis not present

## 2014-11-14 DIAGNOSIS — K5900 Constipation, unspecified: Secondary | ICD-10-CM | POA: Insufficient documentation

## 2014-11-14 DIAGNOSIS — K5730 Diverticulosis of large intestine without perforation or abscess without bleeding: Secondary | ICD-10-CM | POA: Insufficient documentation

## 2014-11-14 HISTORY — DX: Essential (primary) hypertension: I10

## 2014-11-14 MED ORDER — IOHEXOL 300 MG/ML  SOLN
100.0000 mL | Freq: Once | INTRAMUSCULAR | Status: AC | PRN
  Administered 2014-11-14: 100 mL via INTRAVENOUS

## 2014-12-29 ENCOUNTER — Other Ambulatory Visit: Payer: Self-pay | Admitting: *Deleted

## 2014-12-29 ENCOUNTER — Inpatient Hospital Stay: Payer: Medicare Other | Attending: Internal Medicine

## 2014-12-29 DIAGNOSIS — D696 Thrombocytopenia, unspecified: Secondary | ICD-10-CM

## 2014-12-29 DIAGNOSIS — D509 Iron deficiency anemia, unspecified: Secondary | ICD-10-CM

## 2014-12-29 DIAGNOSIS — D472 Monoclonal gammopathy: Secondary | ICD-10-CM | POA: Insufficient documentation

## 2014-12-29 LAB — CBC WITH DIFFERENTIAL/PLATELET
BASOS ABS: 0 10*3/uL (ref 0–0.1)
BASOS PCT: 1 %
EOS PCT: 2 %
Eosinophils Absolute: 0.1 10*3/uL (ref 0–0.7)
HCT: 34.3 % — ABNORMAL LOW (ref 35.0–47.0)
Hemoglobin: 11.7 g/dL — ABNORMAL LOW (ref 12.0–16.0)
Lymphocytes Relative: 19 %
Lymphs Abs: 0.9 10*3/uL — ABNORMAL LOW (ref 1.0–3.6)
MCH: 28.5 pg (ref 26.0–34.0)
MCHC: 34.2 g/dL (ref 32.0–36.0)
MCV: 83.3 fL (ref 80.0–100.0)
Monocytes Absolute: 0.5 10*3/uL (ref 0.2–0.9)
Monocytes Relative: 11 %
Neutro Abs: 3.2 10*3/uL (ref 1.4–6.5)
Neutrophils Relative %: 67 %
Platelets: 77 10*3/uL — ABNORMAL LOW (ref 150–440)
RBC: 4.11 MIL/uL (ref 3.80–5.20)
RDW: 16.7 % — AB (ref 11.5–14.5)
WBC: 4.8 10*3/uL (ref 3.6–11.0)

## 2014-12-29 LAB — IRON AND TIBC
IRON: 58 ug/dL (ref 28–170)
Saturation Ratios: 18 % (ref 10.4–31.8)
TIBC: 325 ug/dL (ref 250–450)
UIBC: 267 ug/dL

## 2014-12-29 LAB — FERRITIN: Ferritin: 27 ng/mL (ref 11–307)

## 2015-03-31 ENCOUNTER — Encounter: Payer: Self-pay | Admitting: *Deleted

## 2015-03-31 ENCOUNTER — Other Ambulatory Visit: Payer: Self-pay | Admitting: *Deleted

## 2015-03-31 DIAGNOSIS — D696 Thrombocytopenia, unspecified: Secondary | ICD-10-CM

## 2015-03-31 DIAGNOSIS — D509 Iron deficiency anemia, unspecified: Secondary | ICD-10-CM

## 2015-04-06 ENCOUNTER — Inpatient Hospital Stay: Payer: Medicare Other | Admitting: Internal Medicine

## 2015-04-06 ENCOUNTER — Inpatient Hospital Stay: Payer: Medicare Other

## 2015-04-07 ENCOUNTER — Encounter: Payer: Self-pay | Admitting: *Deleted

## 2015-04-07 NOTE — Progress Notes (Signed)
msg sent to scheduling. To r/s 04/06/15 lab/md apt that was cnl due to inclement weather. Spoke with md. Ok to r/s apt out for the next 1-2 weeks.

## 2015-04-22 ENCOUNTER — Inpatient Hospital Stay (HOSPITAL_BASED_OUTPATIENT_CLINIC_OR_DEPARTMENT_OTHER): Payer: Medicare Other | Admitting: Internal Medicine

## 2015-04-22 ENCOUNTER — Inpatient Hospital Stay: Payer: Medicare Other | Attending: Internal Medicine

## 2015-04-22 ENCOUNTER — Encounter: Payer: Self-pay | Admitting: Internal Medicine

## 2015-04-22 VITALS — BP 127/81 | HR 45 | Temp 97.9°F | Resp 18 | Ht 60.0 in | Wt 179.5 lb

## 2015-04-22 DIAGNOSIS — M545 Low back pain: Secondary | ICD-10-CM | POA: Insufficient documentation

## 2015-04-22 DIAGNOSIS — G8929 Other chronic pain: Secondary | ICD-10-CM | POA: Diagnosis not present

## 2015-04-22 DIAGNOSIS — Z7982 Long term (current) use of aspirin: Secondary | ICD-10-CM | POA: Diagnosis not present

## 2015-04-22 DIAGNOSIS — K7460 Unspecified cirrhosis of liver: Secondary | ICD-10-CM | POA: Insufficient documentation

## 2015-04-22 DIAGNOSIS — Z79899 Other long term (current) drug therapy: Secondary | ICD-10-CM | POA: Diagnosis not present

## 2015-04-22 DIAGNOSIS — R600 Localized edema: Secondary | ICD-10-CM

## 2015-04-22 DIAGNOSIS — I10 Essential (primary) hypertension: Secondary | ICD-10-CM | POA: Diagnosis not present

## 2015-04-22 DIAGNOSIS — I2510 Atherosclerotic heart disease of native coronary artery without angina pectoris: Secondary | ICD-10-CM | POA: Insufficient documentation

## 2015-04-22 DIAGNOSIS — D509 Iron deficiency anemia, unspecified: Secondary | ICD-10-CM | POA: Insufficient documentation

## 2015-04-22 DIAGNOSIS — G4730 Sleep apnea, unspecified: Secondary | ICD-10-CM

## 2015-04-22 DIAGNOSIS — D696 Thrombocytopenia, unspecified: Secondary | ICD-10-CM

## 2015-04-22 DIAGNOSIS — E785 Hyperlipidemia, unspecified: Secondary | ICD-10-CM | POA: Insufficient documentation

## 2015-04-22 DIAGNOSIS — Z86010 Personal history of colonic polyps: Secondary | ICD-10-CM | POA: Diagnosis not present

## 2015-04-22 DIAGNOSIS — K219 Gastro-esophageal reflux disease without esophagitis: Secondary | ICD-10-CM | POA: Insufficient documentation

## 2015-04-22 DIAGNOSIS — D472 Monoclonal gammopathy: Secondary | ICD-10-CM | POA: Diagnosis not present

## 2015-04-22 LAB — CBC WITH DIFFERENTIAL/PLATELET
Basophils Absolute: 0.1 10*3/uL (ref 0–0.1)
Basophils Relative: 1 %
EOS ABS: 0.2 10*3/uL (ref 0–0.7)
Eosinophils Relative: 2 %
HCT: 38.8 % (ref 35.0–47.0)
HEMOGLOBIN: 13.3 g/dL (ref 12.0–16.0)
Lymphocytes Relative: 20 %
Lymphs Abs: 1.3 10*3/uL (ref 1.0–3.6)
MCH: 28.9 pg (ref 26.0–34.0)
MCHC: 34.2 g/dL (ref 32.0–36.0)
MCV: 84.4 fL (ref 80.0–100.0)
Monocytes Absolute: 0.7 10*3/uL (ref 0.2–0.9)
Monocytes Relative: 11 %
NEUTROS PCT: 66 %
Neutro Abs: 4.2 10*3/uL (ref 1.4–6.5)
Platelets: 91 10*3/uL — ABNORMAL LOW (ref 150–440)
RBC: 4.6 MIL/uL (ref 3.80–5.20)
RDW: 16.6 % — ABNORMAL HIGH (ref 11.5–14.5)
WBC: 6.4 10*3/uL (ref 3.6–11.0)

## 2015-04-22 LAB — IRON AND TIBC
Iron: 66 ug/dL (ref 28–170)
Saturation Ratios: 19 % (ref 10.4–31.8)
TIBC: 354 ug/dL (ref 250–450)
UIBC: 288 ug/dL

## 2015-04-22 LAB — FERRITIN: FERRITIN: 30 ng/mL (ref 11–307)

## 2015-04-22 NOTE — Progress Notes (Signed)
[place] OFFICE PROGRESS NOTE  Patient Care Team: Tracie Harrier, MD as PCP - General (Internal Medicine)   SUMMARY OF HEMATOLOGIC- ONCOLOGIC HISTORY:  # Anemia iron deficiency- ? 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 I reviewed the patient's prior charts/pertinent labs/imaging detail; findings are summarized above.   Patient's appetite is good. She is not losing any weight or losing any weight. She denies any bleeding. She is not on iron because of history of cirrhosis.   patient has chronic swelling in the legs;  This is not any worse.  REVIEW OF SYSTEMS:  A complete 10 point review of system is done which is negative except mentioned above/history of present illness.   PAST MEDICAL HISTORY :  Past Medical History  Diagnosis Date  . Hypertension   . IDA (iron deficiency anemia)   . 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 Thrombocytopenia ([place])   . Hyperlipidemia   . GERD (gastroesophageal reflux disease)   . Chronic low back pain   . SOB (shortness of breath)   . CAD (coronary artery disease)   . Anemia   . Sleep apnea   . Colon polyps     PAST SURGICAL HISTORY :   Past Surgical History  Procedure Laterality Date  . Total hip arthroplasty Left 03/2003  . Coronary artery bypass graft  09/1998  . Colonoscopy  10/24/2013, 2002    hyperplastic polpys  . Esophagogastroduodenoscopy endoscopy  05/24/2011    FAMILY HISTORY :   Family History  Problem Relation Age of Onset  . Heart disease    . Hypertension    . Anemia    . Colon cancer      SOCIAL HISTORY:   Social History  Substance Use Topics  . Smoking status: Never  Smoker   . Smokeless tobacco: Never Used  . Alcohol Use: No    ALLERGIES:  is allergic to latex; morphine and related; adhesive; and other.  MEDICATIONS:  Current Outpatient Prescriptions  Medication Sig Dispense Refill  . amLODipine (NORVASC) 5 MG tablet TAKE 1 TABLET DAILY FOR BLOOD PRESSURE    . Calcium Carbonate-Vitamin D (CALCIUM-VITAMIN D) 500-200 MG-UNIT tablet Take 1 tablet by mouth daily.    . furosemide (LASIX) 40 MG tablet TAKE 1 TABLET DAILY AS NEEDED    . hydrochlorothiazide (HYDRODIURIL) 25 MG tablet TAKE ONE-HALF (1/2) TABLET DAILY    . levothyroxine (SYNTHROID, LEVOTHROID) 50 MCG tablet TAKE 1 TABLET DAILY    . lisinopril (PRINIVIL,ZESTRIL) 40 MG tablet TAKE 1 TABLET DAILY    . metoprolol succinate (TOPROL-XL) 50 MG 24 hr tablet TAKE 1 TABLET DAILY    . omeprazole (PRILOSEC) 20 MG capsule TAKE 1 CAPSULE DAILY    . rifaximin (XIFAXAN) 550 MG TABS tablet Take by mouth.    . simvastatin (ZOCOR) 20 MG tablet TAKE 1 TABLET EVERY EVENING FOR CHOLESTEROL    . aspirin EC 325 MG tablet Take by mouth.    . folic acid (FOLVITE) A999333 MCG tablet Take by mouth.    . meclizine (ANTIVERT) 12.5 MG tablet Take by mouth.    . Multiple Vitamin (MULTI-VITAMINS) TABS Take by mouth.    . traMADol (ULTRAM) 50 MG tablet     .  vitamin C (ASCORBIC ACID) 500 MG tablet Take by mouth.     No current facility-administered medications for this visit.    PHYSICAL EXAMINATION:   BP 127/81 mmHg  Pulse 45  Temp(Src) 97.9 F (36.6 C) (Tympanic)  Resp 18  Ht 5' (1.524 m)  Wt 179 lb 7.3 oz (81.4 kg)  BMI 35.05 kg/m2  Filed Weights   04/22/15 1050  Weight: 179 lb 7.3 oz (81.4 kg)    GENERAL: Well-nourished well-developed; Alert, no distress and comfortable.  Accompanied by her husband.  EYES: no pallor or icterus OROPHARYNX: no thrush or ulceration; good dentition  NECK: supple, no masses felt LYMPH:  no palpable lymphadenopathy in the cervical, axillary or inguinal regions LUNGS: clear to  auscultation and  No wheeze or crackles HEART/CVS: regular rate & rhythm and no murmurs; No lower extremity edema ABDOMEN:abdomen soft, non-tender and normal bowel sounds; positive for splenomegaly.   Musculoskeletal:no cyanosis of digits and no clubbing  PSYCH: alert & oriented x 3 with fluent speech NEURO: no focal motor/sensory deficits SKIN:  no rashes or significant lesions  LABORATORY DATA:  I have reviewed the data as listed    Component Value Date/Time   NA 141 02/03/2014 0643   K 3.6 02/03/2014 0643   CL 109* 02/03/2014 0643   CO2 25 02/03/2014 0643   GLUCOSE 93 02/03/2014 0643   BUN 15 02/03/2014 0643   CREATININE 0.99 07/14/2014 1454   CALCIUM 8.6* 07/14/2014 1454   PROT 7.1 11/28/2012 1935   ALBUMIN 3.1* 11/28/2012 1935   AST 40* 11/28/2012 1935   ALT 37 11/28/2012 1935   ALKPHOS 135 11/28/2012 1935   BILITOT 0.8 11/28/2012 1935   GFRNONAA 56* 07/14/2014 1454   GFRNONAA 57* 02/03/2014 0643   GFRAA >60 07/14/2014 1454   GFRAA >60 02/03/2014 0643    No results found for: SPEP, UPEP  Lab Results  Component Value Date   WBC 6.4 04/22/2015   NEUTROABS 4.2 04/22/2015   HGB 13.3 04/22/2015   HCT 38.8 04/22/2015   MCV 84.4 04/22/2015   PLT 91* 04/22/2015      Chemistry      Component Value Date/Time   NA 141 02/03/2014 0643   K 3.6 02/03/2014 0643   CL 109* 02/03/2014 0643   CO2 25 02/03/2014 0643   BUN 15 02/03/2014 0643   CREATININE 0.99 07/14/2014 1454      Component Value Date/Time   CALCIUM 8.6* 07/14/2014 1454   ALKPHOS 135 11/28/2012 1935   AST 40* 11/28/2012 1935   ALT 37 11/28/2012 1935   BILITOT 0.8 11/28/2012 1935       RADIOGRAPHIC STUDIES: I have personally reviewed the radiological images as listed and agreed with the findings in the report. No results found.   ASSESSMENT & PLAN:   # Anemia-  Likely iron deficiency of unclear etiology. Patient's last ferritin in approximately 3 months ago was 27;  Hemoglobin 11.7 at the time.   Today hemoglobin is 13.3. Ferritin pending.  #  MGUS- IgG kappa  0.8 g per deciliter  In April 2016.  Will repeat M protein workup in  6 nose.  #  Chronic thrombocytopenia-  Today platelets are 91 stable.  #  Cirrhosis/ nonalcoholic-   Discussed the risk of  Hepatocellular cancer in patient cirrhosis.  Recommend ultrasound in 6 months.  # 15 minutes face-to-face with the patient discussing the above plan of care; more than 50% of time spent on prognosis/ natural history; counseling and coordination.  Cammie Sickle, MD 04/22/2015 10:59 AM

## 2015-05-25 ENCOUNTER — Other Ambulatory Visit: Payer: Self-pay | Admitting: Nurse Practitioner

## 2015-05-25 ENCOUNTER — Other Ambulatory Visit: Payer: Self-pay | Admitting: Gynecology

## 2015-05-25 DIAGNOSIS — K7460 Unspecified cirrhosis of liver: Secondary | ICD-10-CM

## 2015-06-02 ENCOUNTER — Ambulatory Visit: Payer: Medicare Other

## 2015-06-15 ENCOUNTER — Ambulatory Visit
Admission: RE | Admit: 2015-06-15 | Discharge: 2015-06-15 | Disposition: A | Discharge status: Home / Self Care | Payer: Medicare Other | Source: Ambulatory Visit | Attending: Nurse Practitioner | Admitting: Nurse Practitioner

## 2015-06-15 DIAGNOSIS — K8020 Calculus of gallbladder without cholecystitis without obstruction: Secondary | ICD-10-CM | POA: Diagnosis not present

## 2015-06-15 DIAGNOSIS — K7460 Unspecified cirrhosis of liver: Secondary | ICD-10-CM | POA: Diagnosis not present

## 2015-10-06 ENCOUNTER — Inpatient Hospital Stay: Payer: Medicare Other | Attending: Internal Medicine

## 2015-10-06 DIAGNOSIS — M545 Low back pain: Secondary | ICD-10-CM | POA: Insufficient documentation

## 2015-10-06 DIAGNOSIS — I10 Essential (primary) hypertension: Secondary | ICD-10-CM | POA: Insufficient documentation

## 2015-10-06 DIAGNOSIS — K219 Gastro-esophageal reflux disease without esophagitis: Secondary | ICD-10-CM | POA: Insufficient documentation

## 2015-10-06 DIAGNOSIS — Z7982 Long term (current) use of aspirin: Secondary | ICD-10-CM | POA: Insufficient documentation

## 2015-10-06 DIAGNOSIS — E785 Hyperlipidemia, unspecified: Secondary | ICD-10-CM | POA: Insufficient documentation

## 2015-10-06 DIAGNOSIS — Z86010 Personal history of colonic polyps: Secondary | ICD-10-CM | POA: Insufficient documentation

## 2015-10-06 DIAGNOSIS — G4730 Sleep apnea, unspecified: Secondary | ICD-10-CM | POA: Insufficient documentation

## 2015-10-06 DIAGNOSIS — Z79899 Other long term (current) drug therapy: Secondary | ICD-10-CM | POA: Insufficient documentation

## 2015-10-06 DIAGNOSIS — D696 Thrombocytopenia, unspecified: Secondary | ICD-10-CM | POA: Insufficient documentation

## 2015-10-06 DIAGNOSIS — I2510 Atherosclerotic heart disease of native coronary artery without angina pectoris: Secondary | ICD-10-CM | POA: Insufficient documentation

## 2015-10-06 DIAGNOSIS — G8929 Other chronic pain: Secondary | ICD-10-CM | POA: Insufficient documentation

## 2015-10-06 DIAGNOSIS — D509 Iron deficiency anemia, unspecified: Secondary | ICD-10-CM | POA: Insufficient documentation

## 2015-10-06 DIAGNOSIS — D472 Monoclonal gammopathy: Secondary | ICD-10-CM | POA: Insufficient documentation

## 2015-10-06 DIAGNOSIS — K7460 Unspecified cirrhosis of liver: Secondary | ICD-10-CM | POA: Insufficient documentation

## 2015-10-13 ENCOUNTER — Other Ambulatory Visit: Payer: Medicare Other

## 2015-10-14 ENCOUNTER — Inpatient Hospital Stay (HOSPITAL_BASED_OUTPATIENT_CLINIC_OR_DEPARTMENT_OTHER): Payer: Medicare Other | Admitting: Internal Medicine

## 2015-10-14 VITALS — BP 132/70 | HR 55 | Temp 97.5°F | Resp 18 | Ht 60.0 in | Wt 186.2 lb

## 2015-10-14 DIAGNOSIS — G8929 Other chronic pain: Secondary | ICD-10-CM | POA: Diagnosis not present

## 2015-10-14 DIAGNOSIS — K7460 Unspecified cirrhosis of liver: Secondary | ICD-10-CM | POA: Insufficient documentation

## 2015-10-14 DIAGNOSIS — D472 Monoclonal gammopathy: Secondary | ICD-10-CM | POA: Diagnosis not present

## 2015-10-14 DIAGNOSIS — I10 Essential (primary) hypertension: Secondary | ICD-10-CM

## 2015-10-14 DIAGNOSIS — M545 Low back pain: Secondary | ICD-10-CM | POA: Diagnosis not present

## 2015-10-14 DIAGNOSIS — I2510 Atherosclerotic heart disease of native coronary artery without angina pectoris: Secondary | ICD-10-CM | POA: Diagnosis not present

## 2015-10-14 DIAGNOSIS — Z7982 Long term (current) use of aspirin: Secondary | ICD-10-CM | POA: Diagnosis not present

## 2015-10-14 DIAGNOSIS — K7469 Other cirrhosis of liver: Secondary | ICD-10-CM

## 2015-10-14 DIAGNOSIS — D509 Iron deficiency anemia, unspecified: Secondary | ICD-10-CM

## 2015-10-14 DIAGNOSIS — Z79899 Other long term (current) drug therapy: Secondary | ICD-10-CM | POA: Diagnosis not present

## 2015-10-14 DIAGNOSIS — G4730 Sleep apnea, unspecified: Secondary | ICD-10-CM

## 2015-10-14 DIAGNOSIS — K219 Gastro-esophageal reflux disease without esophagitis: Secondary | ICD-10-CM | POA: Diagnosis not present

## 2015-10-14 DIAGNOSIS — Z86010 Personal history of colonic polyps: Secondary | ICD-10-CM | POA: Diagnosis not present

## 2015-10-14 DIAGNOSIS — D696 Thrombocytopenia, unspecified: Secondary | ICD-10-CM | POA: Diagnosis not present

## 2015-10-14 DIAGNOSIS — E785 Hyperlipidemia, unspecified: Secondary | ICD-10-CM

## 2015-10-14 NOTE — Assessment & Plan Note (Signed)
#   Anemia-  Likely iron deficiency of unclear etiology. Patient's last ferritin in approximately 3 months ago was 27;  Hemoglobin 11.7 at the time.  Today hemoglobin is 13.3. Ferritin pending.  #  MGUS- IgG kappa  0.8 g per deciliter  In April 2016.  Will repeat M protein workup in  6 months  #  Chronic thrombocytopenia-  Today platelets are 91 stable.  #  Cirrhosis/ nonalcoholic-   Discussed the risk of  Hepatocellular cancer in patient cirrhosis.. Await AFP in 6 months; [place]- March 2017- no lesions noted.   # 15 minutes face-to-face with the patient discussing the above plan of care; more than 50% of time spent on prognosis/ natural history; counseling and coordination.

## 2015-10-14 NOTE — Progress Notes (Signed)
Pt reports left hip pain. SOB on exertion such as walking however can swim 2 hours and not be SOB, reports fatigue and a vaginal bleeding 2 months ago, she is seeing gynecologist for tomorrow.

## 2015-10-14 NOTE — Progress Notes (Signed)
[place] OFFICE PROGRESS NOTE  Patient Care Team: Tracie Harrier, MD as PCP - General (Internal Medicine)   SUMMARY OF HEMATOLOGIC- ONCOLOGIC HISTORY:  # Anemia iron deficiency- ? 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 No blood in stools or black stools. Denies any abdominal swelling. No worsening swelling in the legs. She is not losing any weight or losing any weight. She denies any bleeding. She is not on iron because of history of cirrhosis.  She denies any fatigue. No bone pain. No nausea no vomiting.  REVIEW OF SYSTEMS:  A complete 10 point review of system is done which is negative except mentioned above/history of present illness.   PAST MEDICAL HISTORY :  Past Medical History  Diagnosis Date  . Hypertension   . IDA (iron deficiency anemia)   . 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 Thrombocytopenia ([place])   . Hyperlipidemia   . GERD (gastroesophageal reflux disease)   . Chronic low back pain   . SOB (shortness of breath)   . CAD (coronary artery disease)   . Anemia   . Sleep apnea   . Colon polyps     PAST SURGICAL HISTORY :   Past Surgical History  Procedure Laterality Date  . Total hip arthroplasty Left 03/2003  . Coronary artery bypass graft  09/1998  . Colonoscopy  10/24/2013, 2002    hyperplastic polpys  . Esophagogastroduodenoscopy endoscopy  05/24/2011    FAMILY HISTORY :   Family History  Problem Relation Age of Onset  . Heart disease    . Hypertension    . Anemia    . Colon cancer      SOCIAL HISTORY:   Social History  Substance Use Topics  . Smoking status: Never Smoker   . Smokeless tobacco: Never Used  . Alcohol Use: No    ALLERGIES:  is allergic to  latex; morphine and related; adhesive; and other.  MEDICATIONS:  Current Outpatient Prescriptions  Medication Sig Dispense Refill  . amLODipine (NORVASC) 5 MG tablet TAKE 1 TABLET DAILY FOR BLOOD PRESSURE    . aspirin EC 81 MG tablet Take 81 mg by mouth daily.    . Calcium Carbonate-Vitamin D (CALCIUM-VITAMIN D) 500-200 MG-UNIT tablet Take 1 tablet by mouth daily.    . folic acid (FOLVITE) A999333 MCG tablet Take 400 mcg by mouth daily.     . furosemide (LASIX) 40 MG tablet TAKE 1 TABLET DAILY AS NEEDED    . gabapentin (NEURONTIN) 100 MG capsule Take 1 capsule by mouth daily.    . hydrochlorothiazide (HYDRODIURIL) 25 MG tablet TAKE ONE-HALF (1/2) TABLET DAILY    . levothyroxine (SYNTHROID, LEVOTHROID) 50 MCG tablet TAKE 1 TABLET DAILY    . lisinopril (PRINIVIL,ZESTRIL) 40 MG tablet TAKE 1 TABLET DAILY    . meclizine (ANTIVERT) 12.5 MG tablet Take by mouth. Reported on 10/14/2015    . metoprolol succinate (TOPROL-XL) 50 MG 24 hr tablet TAKE 1 TABLET DAILY    . Multiple Vitamin (MULTI-VITAMINS) TABS Take by mouth.    Marland Kitchen omeprazole (PRILOSEC) 20 MG capsule TAKE 1 CAPSULE DAILY    . rifaximin (XIFAXAN) 550 MG TABS tablet Take by mouth.    . simvastatin (ZOCOR) 20 MG tablet TAKE 1 TABLET EVERY EVENING FOR CHOLESTEROL    .  vitamin C (ASCORBIC ACID) 500 MG tablet Take by mouth.    . lactulose, encephalopathy, (CHRONULAC) 10 GM/15ML SOLN     . traMADol (ULTRAM) 50 MG tablet Reported on 10/14/2015     No current facility-administered medications for this visit.    PHYSICAL EXAMINATION:   BP 132/70 mmHg  Pulse 55  Temp(Src) 97.5 F (36.4 C) (Tympanic)  Resp 18  Ht 5' (1.524 m)  Wt 186 lb 3.2 oz (84.46 kg)  BMI 36.36 kg/m2  Filed Weights   10/14/15 1003  Weight: 186 lb 3.2 oz (84.46 kg)    GENERAL: Well-nourished well-developed; Alert, no distress and comfortable.  Accompanied by her husband.  EYES: no pallor or icterus OROPHARYNX: no thrush or ulceration; good dentition  NECK: supple,  no masses felt LYMPH:  no palpable lymphadenopathy in the cervical, axillary or inguinal regions LUNGS: clear to auscultation and  No wheeze or crackles HEART/CVS: regular rate & rhythm and no murmurs; No lower extremity edema ABDOMEN:abdomen soft, non-tender and normal bowel sounds; positive for splenomegaly.   Musculoskeletal:no cyanosis of digits and no clubbing  PSYCH: alert & oriented x 3 with fluent speech NEURO: no focal motor/sensory deficits SKIN:  no rashes or significant lesions  LABORATORY DATA:  I have reviewed the data as listed    Component Value Date/Time   NA 141 02/03/2014 0643   K 3.6 02/03/2014 0643   CL 109* 02/03/2014 0643   CO2 25 02/03/2014 0643   GLUCOSE 93 02/03/2014 0643   BUN 15 02/03/2014 0643   CREATININE 0.99 07/14/2014 1454   CALCIUM 8.6* 07/14/2014 1454   PROT 7.1 11/28/2012 1935   ALBUMIN 3.1* 11/28/2012 1935   AST 40* 11/28/2012 1935   ALT 37 11/28/2012 1935   ALKPHOS 135 11/28/2012 1935   BILITOT 0.8 11/28/2012 1935   GFRNONAA 56* 07/14/2014 1454   GFRNONAA 57* 02/03/2014 0643   GFRAA >60 07/14/2014 1454   GFRAA >60 02/03/2014 0643    No results found for: SPEP, UPEP  Lab Results  Component Value Date   WBC 6.4 04/22/2015   NEUTROABS 4.2 04/22/2015   HGB 13.3 04/22/2015   HCT 38.8 04/22/2015   MCV 84.4 04/22/2015   PLT 91* 04/22/2015      Chemistry      Component Value Date/Time   NA 141 02/03/2014 0643   K 3.6 02/03/2014 0643   CL 109* 02/03/2014 0643   CO2 25 02/03/2014 0643   BUN 15 02/03/2014 0643   CREATININE 0.99 07/14/2014 1454      Component Value Date/Time   CALCIUM 8.6* 07/14/2014 1454   ALKPHOS 135 11/28/2012 1935   AST 40* 11/28/2012 1935   ALT 37 11/28/2012 1935   BILITOT 0.8 11/28/2012 1935       RADIOGRAPHIC STUDIES: I have personally reviewed the radiological images as listed and agreed with the findings in the report. No results found.   ASSESSMENT & PLAN:  Thrombocytopenia    Iron  deficiency anemia # Anemia-  Likely iron deficiency of unclear etiology. Patient's last ferritin in approximately 3 months ago was 27;  Hemoglobin 11.7 at the time.  Today hemoglobin is 13.3. Ferritin pending.  #  MGUS- IgG kappa  0.8 g per deciliter  In April 2016.  Will repeat M protein workup in  6 months  #  Chronic thrombocytopenia-  Today platelets are 91 stable.  #  Cirrhosis/ nonalcoholic-   Discussed the risk of  Hepatocellular cancer in patient cirrhosis.. Await AFP in 6  months; [place]- March 2017- no lesions noted.   # 15 minutes face-to-face with the patient discussing the above plan of care; more than 50% of time spent on prognosis/ natural history; counseling and coordination.      Cammie Sickle, MD 10/14/2015 10:20 PM

## 2015-10-21 ENCOUNTER — Ambulatory Visit: Payer: Medicare Other | Admitting: Internal Medicine

## 2016-02-29 ENCOUNTER — Other Ambulatory Visit: Payer: Self-pay | Admitting: Nurse Practitioner

## 2016-02-29 DIAGNOSIS — K7460 Unspecified cirrhosis of liver: Secondary | ICD-10-CM

## 2016-03-07 ENCOUNTER — Ambulatory Visit: Payer: Medicare Other

## 2016-04-06 ENCOUNTER — Inpatient Hospital Stay: Payer: Medicare Other | Attending: Hematology and Oncology

## 2016-04-06 DIAGNOSIS — Z8673 Personal history of transient ischemic attack (TIA), and cerebral infarction without residual deficits: Secondary | ICD-10-CM | POA: Insufficient documentation

## 2016-04-06 DIAGNOSIS — Z7982 Long term (current) use of aspirin: Secondary | ICD-10-CM | POA: Insufficient documentation

## 2016-04-06 DIAGNOSIS — R531 Weakness: Secondary | ICD-10-CM | POA: Insufficient documentation

## 2016-04-06 DIAGNOSIS — Z823 Family history of stroke: Secondary | ICD-10-CM | POA: Diagnosis not present

## 2016-04-06 DIAGNOSIS — I2510 Atherosclerotic heart disease of native coronary artery without angina pectoris: Secondary | ICD-10-CM | POA: Diagnosis not present

## 2016-04-06 DIAGNOSIS — G4730 Sleep apnea, unspecified: Secondary | ICD-10-CM | POA: Insufficient documentation

## 2016-04-06 DIAGNOSIS — K7460 Unspecified cirrhosis of liver: Secondary | ICD-10-CM | POA: Diagnosis not present

## 2016-04-06 DIAGNOSIS — I672 Cerebral atherosclerosis: Secondary | ICD-10-CM | POA: Diagnosis not present

## 2016-04-06 DIAGNOSIS — I10 Essential (primary) hypertension: Secondary | ICD-10-CM | POA: Insufficient documentation

## 2016-04-06 DIAGNOSIS — R1310 Dysphagia, unspecified: Secondary | ICD-10-CM | POA: Diagnosis not present

## 2016-04-06 DIAGNOSIS — E785 Hyperlipidemia, unspecified: Secondary | ICD-10-CM | POA: Diagnosis not present

## 2016-04-06 DIAGNOSIS — M545 Low back pain: Secondary | ICD-10-CM | POA: Insufficient documentation

## 2016-04-06 DIAGNOSIS — K219 Gastro-esophageal reflux disease without esophagitis: Secondary | ICD-10-CM | POA: Insufficient documentation

## 2016-04-06 DIAGNOSIS — K7469 Other cirrhosis of liver: Secondary | ICD-10-CM

## 2016-04-06 DIAGNOSIS — D472 Monoclonal gammopathy: Secondary | ICD-10-CM | POA: Diagnosis present

## 2016-04-06 DIAGNOSIS — G8929 Other chronic pain: Secondary | ICD-10-CM | POA: Insufficient documentation

## 2016-04-06 DIAGNOSIS — D696 Thrombocytopenia, unspecified: Secondary | ICD-10-CM | POA: Diagnosis not present

## 2016-04-06 DIAGNOSIS — D509 Iron deficiency anemia, unspecified: Secondary | ICD-10-CM | POA: Diagnosis not present

## 2016-04-06 DIAGNOSIS — R2681 Unsteadiness on feet: Secondary | ICD-10-CM | POA: Diagnosis not present

## 2016-04-06 DIAGNOSIS — E1165 Type 2 diabetes mellitus with hyperglycemia: Secondary | ICD-10-CM | POA: Diagnosis not present

## 2016-04-06 DIAGNOSIS — Z86010 Personal history of colonic polyps: Secondary | ICD-10-CM | POA: Diagnosis not present

## 2016-04-06 DIAGNOSIS — Z79899 Other long term (current) drug therapy: Secondary | ICD-10-CM | POA: Insufficient documentation

## 2016-04-06 DIAGNOSIS — Z794 Long term (current) use of insulin: Secondary | ICD-10-CM | POA: Insufficient documentation

## 2016-04-06 LAB — CBC WITH DIFFERENTIAL/PLATELET
BASOS PCT: 1 %
Basophils Absolute: 0 10*3/uL (ref 0–0.1)
EOS ABS: 0.1 10*3/uL (ref 0–0.7)
EOS PCT: 2 %
HCT: 33.2 % — ABNORMAL LOW (ref 35.0–47.0)
HEMOGLOBIN: 11.7 g/dL — AB (ref 12.0–16.0)
LYMPHS ABS: 0.9 10*3/uL — AB (ref 1.0–3.6)
Lymphocytes Relative: 19 %
MCH: 29.2 pg (ref 26.0–34.0)
MCHC: 35.1 g/dL (ref 32.0–36.0)
MCV: 83.2 fL (ref 80.0–100.0)
MONO ABS: 0.4 10*3/uL (ref 0.2–0.9)
MONOS PCT: 9 %
Neutro Abs: 3.2 10*3/uL (ref 1.4–6.5)
Neutrophils Relative %: 69 %
Platelets: 69 10*3/uL — ABNORMAL LOW (ref 150–440)
RBC: 4 MIL/uL (ref 3.80–5.20)
RDW: 16.6 % — AB (ref 11.5–14.5)
WBC: 4.6 10*3/uL (ref 3.6–11.0)

## 2016-04-06 LAB — COMPREHENSIVE METABOLIC PANEL
ALBUMIN: 3.5 g/dL (ref 3.5–5.0)
ALT: 35 U/L (ref 14–54)
AST: 42 U/L — AB (ref 15–41)
Alkaline Phosphatase: 106 U/L (ref 38–126)
Anion gap: 6 (ref 5–15)
BUN: 17 mg/dL (ref 6–20)
CALCIUM: 9 mg/dL (ref 8.9–10.3)
CHLORIDE: 92 mmol/L — AB (ref 101–111)
CO2: 30 mmol/L (ref 22–32)
CREATININE: 1.2 mg/dL — AB (ref 0.44–1.00)
GFR calc non Af Amer: 42 mL/min — ABNORMAL LOW (ref >60)
GFR, EST AFRICAN AMERICAN: 49 mL/min — AB (ref >60)
GLUCOSE: 350 mg/dL — AB (ref 65–99)
Potassium: 3.4 mmol/L — ABNORMAL LOW (ref 3.5–5.1)
SODIUM: 128 mmol/L — AB (ref 135–145)
Total Bilirubin: 2 mg/dL — ABNORMAL HIGH (ref 0.3–1.2)
Total Protein: 7.6 g/dL (ref 6.5–8.1)

## 2016-04-06 LAB — FERRITIN: FERRITIN: 36 ng/mL (ref 11–307)

## 2016-04-07 LAB — KAPPA/LAMBDA LIGHT CHAINS
KAPPA FREE LGHT CHN: 66.4 mg/L — AB (ref 3.3–19.4)
KAPPA, LAMDA LIGHT CHAIN RATIO: 2.32 — AB (ref 0.26–1.65)
Lambda free light chains: 28.6 mg/L — ABNORMAL HIGH (ref 5.7–26.3)

## 2016-04-07 LAB — MULTIPLE MYELOMA PANEL, SERUM
ALBUMIN SERPL ELPH-MCNC: 3.2 g/dL (ref 2.9–4.4)
ALBUMIN/GLOB SERPL: 0.9 (ref 0.7–1.7)
ALPHA 1: 0.2 g/dL (ref 0.0–0.4)
ALPHA2 GLOB SERPL ELPH-MCNC: 0.5 g/dL (ref 0.4–1.0)
B-GLOBULIN SERPL ELPH-MCNC: 2.1 g/dL — AB (ref 0.7–1.3)
GAMMA GLOB SERPL ELPH-MCNC: 1.1 g/dL (ref 0.4–1.8)
GLOBULIN, TOTAL: 4 g/dL — AB (ref 2.2–3.9)
IGG (IMMUNOGLOBIN G), SERUM: 2036 mg/dL — AB (ref 700–1600)
IgA: 218 mg/dL (ref 64–422)
IgM, Serum: 309 mg/dL — ABNORMAL HIGH (ref 26–217)
M Protein SerPl Elph-Mcnc: 0.6 g/dL — ABNORMAL HIGH
PDF: 0
Total Protein ELP: 7.2 g/dL (ref 6.0–8.5)

## 2016-04-07 LAB — AFP TUMOR MARKER: AFP TUMOR MARKER: 3.3 ng/mL (ref 0.0–8.3)

## 2016-04-08 ENCOUNTER — Ambulatory Visit
Admission: RE | Admit: 2016-04-08 | Discharge: 2016-04-08 | Disposition: A | Discharge status: Home / Self Care | Payer: Medicare Other | Source: Ambulatory Visit | Attending: Nurse Practitioner | Admitting: Nurse Practitioner

## 2016-04-08 DIAGNOSIS — K7460 Unspecified cirrhosis of liver: Secondary | ICD-10-CM | POA: Insufficient documentation

## 2016-04-08 DIAGNOSIS — K8020 Calculus of gallbladder without cholecystitis without obstruction: Secondary | ICD-10-CM | POA: Insufficient documentation

## 2016-04-08 DIAGNOSIS — R161 Splenomegaly, not elsewhere classified: Secondary | ICD-10-CM | POA: Insufficient documentation

## 2016-04-13 ENCOUNTER — Ambulatory Visit: Payer: Medicare Other | Admitting: Internal Medicine

## 2016-04-13 ENCOUNTER — Ambulatory Visit: Payer: Medicare Other | Admitting: Hematology and Oncology

## 2016-04-22 ENCOUNTER — Inpatient Hospital Stay: Payer: Medicare Other

## 2016-04-22 ENCOUNTER — Inpatient Hospital Stay
Admission: EM | Admit: 2016-04-22 | Discharge: 2016-04-24 | DRG: 073 | Disposition: A | Discharge status: Home Health | Payer: Medicare Other | Attending: Internal Medicine | Admitting: Internal Medicine

## 2016-04-22 ENCOUNTER — Emergency Department: Payer: Medicare Other

## 2016-04-22 ENCOUNTER — Inpatient Hospital Stay (HOSPITAL_BASED_OUTPATIENT_CLINIC_OR_DEPARTMENT_OTHER): Payer: Medicare Other | Admitting: Internal Medicine

## 2016-04-22 DIAGNOSIS — M545 Low back pain: Secondary | ICD-10-CM

## 2016-04-22 DIAGNOSIS — E1140 Type 2 diabetes mellitus with diabetic neuropathy, unspecified: Principal | ICD-10-CM | POA: Diagnosis present

## 2016-04-22 DIAGNOSIS — D472 Monoclonal gammopathy: Secondary | ICD-10-CM | POA: Diagnosis present

## 2016-04-22 DIAGNOSIS — D696 Thrombocytopenia, unspecified: Secondary | ICD-10-CM

## 2016-04-22 DIAGNOSIS — R1310 Dysphagia, unspecified: Secondary | ICD-10-CM | POA: Diagnosis present

## 2016-04-22 DIAGNOSIS — Z7982 Long term (current) use of aspirin: Secondary | ICD-10-CM

## 2016-04-22 DIAGNOSIS — Z96642 Presence of left artificial hip joint: Secondary | ICD-10-CM | POA: Diagnosis present

## 2016-04-22 DIAGNOSIS — E876 Hypokalemia: Secondary | ICD-10-CM | POA: Diagnosis not present

## 2016-04-22 DIAGNOSIS — K219 Gastro-esophageal reflux disease without esophagitis: Secondary | ICD-10-CM

## 2016-04-22 DIAGNOSIS — R2681 Unsteadiness on feet: Secondary | ICD-10-CM

## 2016-04-22 DIAGNOSIS — I639 Cerebral infarction, unspecified: Secondary | ICD-10-CM | POA: Diagnosis present

## 2016-04-22 DIAGNOSIS — K7460 Unspecified cirrhosis of liver: Secondary | ICD-10-CM

## 2016-04-22 DIAGNOSIS — D61818 Other pancytopenia: Secondary | ICD-10-CM | POA: Diagnosis present

## 2016-04-22 DIAGNOSIS — D509 Iron deficiency anemia, unspecified: Secondary | ICD-10-CM | POA: Diagnosis not present

## 2016-04-22 DIAGNOSIS — E785 Hyperlipidemia, unspecified: Secondary | ICD-10-CM

## 2016-04-22 DIAGNOSIS — Z800 Family history of malignant neoplasm of digestive organs: Secondary | ICD-10-CM

## 2016-04-22 DIAGNOSIS — G8929 Other chronic pain: Secondary | ICD-10-CM

## 2016-04-22 DIAGNOSIS — Z86010 Personal history of colonic polyps: Secondary | ICD-10-CM

## 2016-04-22 DIAGNOSIS — E860 Dehydration: Secondary | ICD-10-CM | POA: Diagnosis present

## 2016-04-22 DIAGNOSIS — I10 Essential (primary) hypertension: Secondary | ICD-10-CM | POA: Diagnosis present

## 2016-04-22 DIAGNOSIS — Z951 Presence of aortocoronary bypass graft: Secondary | ICD-10-CM | POA: Diagnosis not present

## 2016-04-22 DIAGNOSIS — Z8249 Family history of ischemic heart disease and other diseases of the circulatory system: Secondary | ICD-10-CM | POA: Diagnosis not present

## 2016-04-22 DIAGNOSIS — E1165 Type 2 diabetes mellitus with hyperglycemia: Secondary | ICD-10-CM

## 2016-04-22 DIAGNOSIS — Z794 Long term (current) use of insulin: Secondary | ICD-10-CM

## 2016-04-22 DIAGNOSIS — I2510 Atherosclerotic heart disease of native coronary artery without angina pectoris: Secondary | ICD-10-CM | POA: Diagnosis present

## 2016-04-22 DIAGNOSIS — Z8673 Personal history of transient ischemic attack (TIA), and cerebral infarction without residual deficits: Secondary | ICD-10-CM

## 2016-04-22 DIAGNOSIS — R0602 Shortness of breath: Secondary | ICD-10-CM | POA: Diagnosis present

## 2016-04-22 DIAGNOSIS — G4730 Sleep apnea, unspecified: Secondary | ICD-10-CM

## 2016-04-22 DIAGNOSIS — E1110 Type 2 diabetes mellitus with ketoacidosis without coma: Secondary | ICD-10-CM

## 2016-04-22 DIAGNOSIS — E871 Hypo-osmolality and hyponatremia: Secondary | ICD-10-CM | POA: Diagnosis present

## 2016-04-22 DIAGNOSIS — Z823 Family history of stroke: Secondary | ICD-10-CM

## 2016-04-22 DIAGNOSIS — E039 Hypothyroidism, unspecified: Secondary | ICD-10-CM | POA: Diagnosis present

## 2016-04-22 DIAGNOSIS — R531 Weakness: Secondary | ICD-10-CM

## 2016-04-22 DIAGNOSIS — R739 Hyperglycemia, unspecified: Secondary | ICD-10-CM

## 2016-04-22 DIAGNOSIS — N170 Acute kidney failure with tubular necrosis: Secondary | ICD-10-CM | POA: Diagnosis present

## 2016-04-22 DIAGNOSIS — I672 Cerebral atherosclerosis: Secondary | ICD-10-CM

## 2016-04-22 DIAGNOSIS — Z79899 Other long term (current) drug therapy: Secondary | ICD-10-CM

## 2016-04-22 HISTORY — DX: Unspecified cirrhosis of liver: K74.60

## 2016-04-22 LAB — DIFFERENTIAL
BASOS ABS: 0 10*3/uL (ref 0–0.1)
BASOS PCT: 0 %
EOS ABS: 0.1 10*3/uL (ref 0–0.7)
EOS PCT: 3 %
LYMPHS ABS: 0.5 10*3/uL — AB (ref 1.0–3.6)
Lymphocytes Relative: 14 %
Monocytes Absolute: 0.4 10*3/uL (ref 0.2–0.9)
Monocytes Relative: 10 %
NEUTROS PCT: 73 %
Neutro Abs: 2.6 10*3/uL (ref 1.4–6.5)

## 2016-04-22 LAB — URINALYSIS, COMPLETE (UACMP) WITH MICROSCOPIC
Bilirubin Urine: NEGATIVE
Glucose, UA: >=500 mg/dL — AB
Hgb urine dipstick: NEGATIVE
KETONES UR: NEGATIVE mg/dL
LEUKOCYTES UA: NEGATIVE
Nitrite: NEGATIVE
PH: 7 (ref 5.0–8.0)
PROTEIN: NEGATIVE mg/dL
Specific Gravity, Urine: 1.01 (ref 1.005–1.030)

## 2016-04-22 LAB — COMPREHENSIVE METABOLIC PANEL
ALBUMIN: 3.7 g/dL (ref 3.5–5.0)
ALT: 35 U/L (ref 14–54)
ANION GAP: 8 (ref 5–15)
AST: 45 U/L — AB (ref 15–41)
Alkaline Phosphatase: 115 U/L (ref 38–126)
BILIRUBIN TOTAL: 1.9 mg/dL — AB (ref 0.3–1.2)
BUN: 25 mg/dL — AB (ref 6–20)
CHLORIDE: 85 mmol/L — AB (ref 101–111)
CO2: 33 mmol/L — AB (ref 22–32)
Calcium: 9.1 mg/dL (ref 8.9–10.3)
Creatinine, Ser: 1.68 mg/dL — ABNORMAL HIGH (ref 0.44–1.00)
GFR calc Af Amer: 33 mL/min — ABNORMAL LOW (ref >60)
GFR calc non Af Amer: 28 mL/min — ABNORMAL LOW (ref >60)
GLUCOSE: 690 mg/dL — AB (ref 65–99)
POTASSIUM: 3.8 mmol/L (ref 3.5–5.1)
SODIUM: 126 mmol/L — AB (ref 135–145)
TOTAL PROTEIN: 7.8 g/dL (ref 6.5–8.1)

## 2016-04-22 LAB — APTT: APTT: 28 s (ref 24–36)

## 2016-04-22 LAB — CBC
HCT: 35 % (ref 35.0–47.0)
HEMOGLOBIN: 11.8 g/dL — AB (ref 12.0–16.0)
MCH: 29.1 pg (ref 26.0–34.0)
MCHC: 33.7 g/dL (ref 32.0–36.0)
MCV: 86.3 fL (ref 80.0–100.0)
PLATELETS: 63 10*3/uL — AB (ref 150–440)
RBC: 4.05 MIL/uL (ref 3.80–5.20)
RDW: 15.9 % — AB (ref 11.5–14.5)
WBC: 3.6 10*3/uL (ref 3.6–11.0)

## 2016-04-22 LAB — GLUCOSE, CAPILLARY: Glucose-Capillary: 212 mg/dL — ABNORMAL HIGH (ref 65–99)

## 2016-04-22 LAB — CK: CK TOTAL: 51 U/L (ref 38–234)

## 2016-04-22 LAB — AMMONIA: Ammonia: 13 umol/L (ref 9–35)

## 2016-04-22 LAB — BLOOD GAS, VENOUS
ACID-BASE EXCESS: 7.9 mmol/L — AB (ref 0.0–2.0)
BICARBONATE: 34.5 mmol/L — AB (ref 20.0–28.0)
Patient temperature: 37
pCO2, Ven: 57 mmHg (ref 44.0–60.0)
pH, Ven: 7.39 (ref 7.250–7.430)

## 2016-04-22 LAB — PROTIME-INR
INR: 1.04
PROTHROMBIN TIME: 13.6 s (ref 11.4–15.2)

## 2016-04-22 LAB — TROPONIN I: Troponin I: <0.03 ng/mL (ref <0.03)

## 2016-04-22 MED ORDER — STROKE: EARLY STAGES OF RECOVERY BOOK
Freq: Once | Status: AC
  Administered 2016-04-22: 16:00:00

## 2016-04-22 MED ORDER — ASPIRIN 81 MG PO CHEW
243.0000 mg | CHEWABLE_TABLET | Freq: Once | ORAL | Status: AC
Start: 2016-04-22 — End: 2016-04-22
  Administered 2016-04-22: 243 mg via ORAL
  Filled 2016-04-22: qty 3

## 2016-04-22 MED ORDER — ENOXAPARIN SODIUM 30 MG/0.3ML ~~LOC~~ SOLN
30.0000 mg | SUBCUTANEOUS | Status: DC
  Administered 2016-04-22: 30 mg via SUBCUTANEOUS
  Filled 2016-04-22: qty 0.3

## 2016-04-22 MED ORDER — LACTULOSE 10 GM/15ML PO SOLN
10.0000 g | Freq: Every day | ORAL | Status: DC
  Administered 2016-04-23 – 2016-04-24 (×2): 10 g via ORAL
  Filled 2016-04-22 (×2): qty 30

## 2016-04-22 MED ORDER — MECLIZINE HCL 25 MG PO TABS: 12.5000 mg | ORAL_TABLET | ORAL | Status: DC | PRN

## 2016-04-22 MED ORDER — SODIUM CHLORIDE 0.9 % IV BOLUS (SEPSIS)
1000.0000 mL | Freq: Once | INTRAVENOUS | Status: AC
  Administered 2016-04-22: 1000 mL via INTRAVENOUS

## 2016-04-22 MED ORDER — CALCIUM CARBONATE-VITAMIN D 500-200 MG-UNIT PO TABS
1.0000 | ORAL_TABLET | Freq: Every day | ORAL | Status: DC
  Administered 2016-04-23 – 2016-04-24 (×2): 1 via ORAL
  Filled 2016-04-22 (×2): qty 1

## 2016-04-22 MED ORDER — FOLIC ACID 1 MG PO TABS
500.0000 ug | ORAL_TABLET | Freq: Every day | ORAL | Status: DC
  Administered 2016-04-23 – 2016-04-24 (×3): 0.5 mg via ORAL
  Filled 2016-04-22 (×2): qty 1

## 2016-04-22 MED ORDER — INSULIN ASPART 100 UNIT/ML ~~LOC~~ SOLN
0.0000 [IU] | Freq: Three times a day (TID) | SUBCUTANEOUS | Status: DC
  Administered 2016-04-23: 7 [IU] via SUBCUTANEOUS
  Administered 2016-04-23 – 2016-04-24 (×3): 2 [IU] via SUBCUTANEOUS
  Administered 2016-04-24: 12:00:00 7 [IU] via SUBCUTANEOUS
  Filled 2016-04-22 (×2): qty 2
  Filled 2016-04-22 (×2): qty 7
  Filled 2016-04-22: qty 2

## 2016-04-22 MED ORDER — GABAPENTIN 100 MG PO CAPS
200.0000 mg | ORAL_CAPSULE | Freq: Every day | ORAL | Status: DC
  Administered 2016-04-22 – 2016-04-23 (×2): 200 mg via ORAL
  Filled 2016-04-22 (×2): qty 2

## 2016-04-22 MED ORDER — INSULIN GLARGINE 100 UNIT/ML ~~LOC~~ SOLN
18.0000 [IU] | Freq: Once | SUBCUTANEOUS | Status: AC
  Administered 2016-04-22: 22:00:00 18 [IU] via SUBCUTANEOUS
  Filled 2016-04-22: qty 0.18

## 2016-04-22 MED ORDER — INSULIN ASPART 100 UNIT/ML ~~LOC~~ SOLN
10.0000 [IU] | Freq: Once | SUBCUTANEOUS | Status: AC
  Administered 2016-04-22: 10 [IU] via INTRAVENOUS
  Filled 2016-04-22: qty 10

## 2016-04-22 MED ORDER — RIFAXIMIN 550 MG PO TABS
550.0000 mg | ORAL_TABLET | Freq: Two times a day (BID) | ORAL | Status: DC
  Administered 2016-04-22 – 2016-04-24 (×4): 550 mg via ORAL
  Filled 2016-04-22 (×4): qty 1

## 2016-04-22 MED ORDER — INSULIN ASPART 100 UNIT/ML ~~LOC~~ SOLN
0.0000 [IU] | Freq: Every day | SUBCUTANEOUS | Status: DC
  Administered 2016-04-22: 22:00:00 2 [IU] via SUBCUTANEOUS
  Administered 2016-04-23: 23:00:00 4 [IU] via SUBCUTANEOUS
  Filled 2016-04-22: qty 4
  Filled 2016-04-22: qty 2

## 2016-04-22 MED ORDER — ONDANSETRON HCL 4 MG/2ML IJ SOLN: 4.0000 mg | Freq: Four times a day (QID) | INTRAMUSCULAR | Status: DC | PRN

## 2016-04-22 MED ORDER — ENOXAPARIN SODIUM 40 MG/0.4ML ~~LOC~~ SOLN: 40.0000 mg | SUBCUTANEOUS | Status: DC

## 2016-04-22 MED ORDER — MELATONIN 5 MG PO TABS
5.0000 mg | ORAL_TABLET | Freq: Every day | ORAL | Status: DC
  Administered 2016-04-22 – 2016-04-23 (×2): 5 mg via ORAL
  Filled 2016-04-22 (×3): qty 1

## 2016-04-22 MED ORDER — SODIUM CHLORIDE 0.9 % IV SOLN
INTRAVENOUS | Status: DC
  Administered 2016-04-22 – 2016-04-24 (×3): via INTRAVENOUS

## 2016-04-22 MED ORDER — LEVOTHYROXINE SODIUM 50 MCG PO TABS
50.0000 ug | ORAL_TABLET | Freq: Every day | ORAL | Status: DC
  Administered 2016-04-23 – 2016-04-24 (×2): 50 ug via ORAL
  Filled 2016-04-22 (×2): qty 1

## 2016-04-22 MED ORDER — ONDANSETRON HCL 4 MG PO TABS: 4.0000 mg | ORAL_TABLET | Freq: Four times a day (QID) | ORAL | Status: DC | PRN

## 2016-04-22 MED ORDER — LORAZEPAM 2 MG/ML IJ SOLN: 0.5000 mg | Freq: Once | INTRAMUSCULAR | Status: DC

## 2016-04-22 MED ORDER — PANTOPRAZOLE SODIUM 40 MG PO TBEC
40.0000 mg | DELAYED_RELEASE_TABLET | Freq: Every day | ORAL | Status: DC
  Administered 2016-04-23 – 2016-04-24 (×2): 40 mg via ORAL
  Filled 2016-04-22 (×2): qty 1

## 2016-04-22 MED ORDER — CLOPIDOGREL BISULFATE 75 MG PO TABS
75.0000 mg | ORAL_TABLET | Freq: Every day | ORAL | Status: DC
  Administered 2016-04-22 – 2016-04-24 (×3): 75 mg via ORAL
  Filled 2016-04-22 (×4): qty 1

## 2016-04-22 MED ORDER — ATORVASTATIN CALCIUM 20 MG PO TABS
40.0000 mg | ORAL_TABLET | Freq: Every day | ORAL | Status: DC
  Administered 2016-04-24: 40 mg via ORAL
  Filled 2016-04-22: qty 2

## 2016-04-22 MED ORDER — MELATONIN 3 MG PO TABS: 3.0000 mg | ORAL_TABLET | Freq: Every day | ORAL | Status: DC

## 2016-04-22 NOTE — ED Provider Notes (Signed)
[facility] Emergency Department Provider Note  ____________________________________________  Time seen: Approximately 2:46 PM  I have reviewed the triage vital signs and the nursing notes.   HISTORY  Chief Complaint Extremity Weakness   HPI Theresa [name] is a 76 y.o. female history of cirrhosis, CAD, anemia, hypertension, hyperlipidemia, CVA, MGUS who presents for evaluation of right-sided weakness. Patient reports that a week ago she woke up with weakness in her right upper and lower extremity. She has been dragging her right foot. She is also reporting difficulty swallowing solids for the last week as well. Today she went to see her oncologist/hematologist who noticed the right-sided deficits and sent her to the emergency room for evaluation. Patient reports 2-1/2 years ago she had a stroke involving her right side however had no deficits from this stroke. Patient is accompanied by her husband who denies any difficulty finding words or slurred speech. Over the course of the last few weeks patient has had polyuria, polydipsia, and tells me that she lost a little bit awake but unable to tell me how much. She does not have a history of diabetes. Patient denies any chest pain at this time.  Past Medical History:  Diagnosis Date  . Anemia   . CAD (coronary artery disease)   . Chronic low back pain   . 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 Colon polyps   . GERD (gastroesophageal reflux disease)   . Hyperlipidemia   . Hypertension   . IDA (iron deficiency anemia)   . 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 Sleep apnea   . SOB (shortness of breath)   . Thrombocytopenia [facility])     Patient Active Problem List   Diagnosis Date Noted  . MGUS (monoclonal gammopathy of unknown significance) 10/14/2015  . Cirrhosis ([place]) 10/14/2015  . 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 Thrombocytopenia ([place]) 10/03/2014  . Iron deficiency  anemia 10/03/2014    Past Surgical History:  Procedure Laterality Date  . COLONOSCOPY  10/24/2013, 2002   hyperplastic polpys  . CORONARY ARTERY BYPASS GRAFT  09/1998  . ESOPHAGOGASTRODUODENOSCOPY ENDOSCOPY  05/24/2011  . hemorroid surgery    . TOTAL HIP ARTHROPLASTY Left 03/2003    Prior to Admission medications   Medication Sig Start Date End Date Taking? Authorizing Provider  amLODipine (NORVASC) 5 MG tablet TAKE 1 TABLET DAILY FOR BLOOD PRESSURE 03/10/15   Historical Provider, MD  aspirin EC 81 MG tablet Take 81 mg by mouth daily.    Historical Provider, MD  Calcium Carbonate-Vitamin D (CALCIUM-VITAMIN D) 500-200 MG-UNIT tablet Take 1 tablet by mouth daily.    Historical Provider, MD  folic acid (FOLVITE) 793 MCG tablet Take 400 mcg by mouth daily.     Historical Provider, MD  furosemide (LASIX) 40 MG tablet TAKE 1 TABLET DAILY AS NEEDED 02/27/15   Historical Provider, MD  gabapentin (NEURONTIN) 100 MG capsule Take 1 capsule by mouth 2 (two) times daily.  01/26/15   Historical Provider, MD  hydrochlorothiazide (HYDRODIURIL) 25 MG tablet TAKE ONE-HALF (1/2) TABLET DAILY 11/20/14   Historical Provider, MD  lactulose, encephalopathy, ([place]) 10 GM/15ML SOLN  09/27/15   Historical Provider, MD  levothyroxine (SYNTHROID, LEVOTHROID) 50 MCG tablet TAKE 1 TABLET DAILY 03/31/15   Historical Provider, MD  lisinopril (PRINIVIL,ZESTRIL) 40 MG tablet TAKE 1 TABLET DAILY 03/31/15   Historical Provider, MD  meclizine (ANTIVERT) 12.5 MG tablet Take 12.5 mg by mouth as needed. Reported on 10/14/2015  Historical Provider, MD  Melatonin 3 MG TABS Take 3 mg by mouth as needed.    Historical Provider, MD  metoprolol succinate (TOPROL-XL) 50 MG 24 hr tablet TAKE 1 TABLET DAILY 03/10/15   Historical Provider, MD  Multiple Vitamin (MULTI-VITAMINS) TABS Take by mouth.    Historical Provider, MD  omeprazole (PRILOSEC) 20 MG capsule TAKE 1 CAPSULE DAILY 03/24/15   Historical Provider, MD  rifaximin (XIFAXAN) 550  MG TABS tablet Take 550 mg by mouth 2 (two) times daily.  01/20/15   Historical Provider, MD  simvastatin (ZOCOR) 20 MG tablet TAKE 1 TABLET EVERY EVENING FOR CHOLESTEROL 03/24/15   Historical Provider, MD  vitamin C (ASCORBIC ACID) 500 MG tablet Take 500 mg by mouth as needed.     Historical Provider, MD    Allergies Latex; Morphine and related; Adhesive [tape]; and Other  Family History  Problem Relation Age of Onset  . Heart disease    . Hypertension    . Anemia    . Colon cancer      Social History Social History  Substance Use Topics  . Smoking status: Never Smoker  . Smokeless tobacco: Never Used  . Alcohol use No    Review of Systems  Constitutional: Negative for fever. Eyes: Negative for visual changes. ENT: Negative for sore throat. Neck: No neck pain  Cardiovascular: Negative for chest pain. Respiratory: Negative for shortness of breath. Gastrointestinal: Negative for abdominal pain, vomiting or diarrhea. Genitourinary: Negative for dysuria. Endo: + polyuria and polydpsia Musculoskeletal: Negative for back pain. Skin: Negative for rash. Neurological: Negative for headaches. + R sided facial droop, dysphagia. Psych: No SI or HI  ____________________________________________   PHYSICAL EXAM:  VITAL SIGNS: ED Triage Vitals  Enc Vitals Group     BP 04/22/16 1311 (!) 119/53     Pulse Rate 04/22/16 1311 (!) 54     Resp 04/22/16 1311 18     Temp 04/22/16 1311 98.1 F (36.7 C)     Temp Source 04/22/16 1311 Oral     SpO2 04/22/16 1311 95 %     Weight 04/22/16 1312 176 lb 2 oz (79.9 kg)     Height 04/22/16 1312 5' (1.524 m)     Head Circumference --      Peak Flow --      Pain Score 04/22/16 1313 0     Pain Loc --      Pain Edu? --      Excl. in [place]? --     Constitutional: Alert and oriented. Well appearing and in no apparent distress. HEENT:      Head: Normocephalic and atraumatic.         Eyes: Conjunctivae are normal. Sclera is non-icteric. EOMI.  PERRL      Mouth/Throat: Mucous membranes are moist.       Neck: Supple with no signs of meningismus. Cardiovascular: Regular rate and rhythm. No murmurs, gallops, or rubs. 2+ symmetrical distal pulses are present in all extremities. No JVD. Respiratory: Normal respiratory effort. Lungs are clear to auscultation bilaterally. No wheezes, crackles, or rhonchi.  Gastrointestinal: Soft, non tender, and non distended with positive bowel sounds. No rebound or guarding. Musculoskeletal: Nontender with normal range of motion in all extremities. No edema, cyanosis, or erythema of extremities. Neurologic: Normal speech and language. Slight right-sided nasolabial fold asymmetry otherwise cranial nerves are intact. Patient has a slight right-sided pronator drift, dysmetria on finger to nose finger and heel to shin on right upper and lower  extremities. 4/5 strength in RLE and 5-/5 RUE Skin: Skin is warm, dry and intact. No rash noted. Psychiatric: Mood and affect are normal. Speech and behavior are normal.  ____________________________________________   LABS (all labs ordered are listed, but only abnormal results are displayed)  Labs Reviewed  CBC - Abnormal; Notable for the following:       Result Value   Hemoglobin 11.8 (*)    RDW 15.9 (*)    Platelets 63 (*)    All other components within normal limits  DIFFERENTIAL - Abnormal; Notable for the following:    Lymphs Abs 0.5 (*)    All other components within normal limits  COMPREHENSIVE METABOLIC PANEL - Abnormal; Notable for the following:    Sodium 126 (*)    Chloride 85 (*)    CO2 33 (*)    Glucose, Bld 690 (*)    BUN 25 (*)    Creatinine, Ser 1.68 (*)    AST 45 (*)    Total Bilirubin 1.9 (*)    GFR calc non Af Amer 28 (*)    GFR calc Af Amer 33 (*)    All other components within normal limits  BLOOD GAS, VENOUS - Abnormal; Notable for the following:    Bicarbonate 34.5 (*)    Acid-Base Excess 7.9 (*)    All other components within  normal limits  PROTIME-INR  APTT  TROPONIN I  URINALYSIS, COMPLETE (UACMP) WITH MICROSCOPIC  CBG MONITORING, ED   ____________________________________________  EKG  ED ECG REPORT I, Rudene Re, the attending physician, personally viewed and interpreted this ECG. Sinus bradycardia, rate of 53, first-degree AV block, normal QRS and QTc intervals, normal axis, no ST elevations or depressions.   ____________________________________________  RADIOLOGY  Head CT: Heterogeneous appearance of the pons and medulla oblongata, which may represent chronic ischemic changes, however acute infarction cannot be excluded.  Marked brain parenchymal volume loss.  Mild periventricular microangiopathy.  Heavy calcific intracranial atherosclerotic disease.  ____________________________________________   PROCEDURES  Procedure(s) performed: None Procedures Critical Care performed:  None ____________________________________________   INITIAL IMPRESSION / ASSESSMENT AND PLAN / ED COURSE   76 y.o. female history of cirrhosis, CAD, anemia, hypertension, hyperlipidemia, CVA, MGUS who presents for evaluation of right-sided weakness and dysphagia x 7 days. The patient with evidence of stroke based on exam listed below. Has a CT concerning for possible acute infarction on pons and medulla. Blood work also concerning for hyperglycemia and this patient does not have a history of diabetes. Glucose of 690, sodium of 126. 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 Normal bicarbonate. VBG is pending to rule out DKA. Patient took a baby aspirin this morning. We'll give her 3 more. Plan to admit to the hospitalist service.     Pertinent labs & imaging results that were available during my care of the patient were reviewed by me and considered in my medical decision making (see chart for details).    ____________________________________________   FINAL CLINICAL IMPRESSION(S) / ED  DIAGNOSES  Final diagnoses:  Cerebrovascular accident (CVA), unspecified mechanism ([place])  Hyperglycemia      NEW MEDICATIONS STARTED DURING THIS VISIT:  New Prescriptions   No medications on file     Note:  This document was prepared using Dragon voice recognition software and may include unintentional dictation errors.    Rudene Re, MD 04/22/16 1504

## 2016-04-22 NOTE — Assessment & Plan Note (Addendum)
#   Anemia-  Likely iron deficiency of unclear etiology. Hb. 11.7 and ferritin 36. Deferred until next appointment due to acute right sided weaknesses.   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 Deferred for now due to acute right sided weakness as mentioned below.   #  Chronic thrombocytopenia- Today platelets are 63. Decreased but stable.   #  Cirrhosis/ nonalcoholic-  Discussed the risk of  Hepatocellular cancer in patient cirrhosis.. Will get AFP in 6 months; [place]- March 2017- no lesions noted.   # Hyperglycemia- Blood sugar 350 during lab draw. Will defer at this time due to right sided weakness and visit to emergency room.   # Right sided weakness: Sent to emergency room for further evaluation. On examination, noted have right sided pronator drift with unsteady gait. Although normal rate, abnormal heart rhythm auscultated and palpated. Would feel better if Theresa [name] was worked up in the ED due to hx of CVA.

## 2016-04-22 NOTE — ED Notes (Signed)
Pt speaking in full complete sentences, voice clear. Pt in wheelchair but offered to get up and walk to chair for EKG. Pt alert and oriented. Face symmetrical. No facial droop.

## 2016-04-22 NOTE — H&P (Signed)
[facility] NAME: Theresa [name]    MR#:  [id number]  DATE OF BIRTH:  1940/11/06  DATE OF ADMISSION:  04/22/2016  PRIMARY CARE PHYSICIAN: Tracie Harrier, MD   REQUESTING/REFERRING PHYSICIAN: Dr Gonzella Lex  CHIEF COMPLAINT:   Chief Complaint  Patient presents with  . Extremity Weakness    HISTORY OF PRESENT ILLNESS:  Theresa [name]  is a 76 y.o. female with a known history of Cirrhosis of liver. She was sent in by Dr. Burlene Arnt. She complains of unsteady gait with walking and falling to the right and some dragging of her right foot while walking. This has improved since Friday. In the ER she was also found to have a sugar of 690. Looking back at labs done on 04/06/2016 her sugar was 350 at that time. Hospitalist services were contacted for further evaluation.  PAST MEDICAL HISTORY:   Past Medical History:  Diagnosis Date  . Anemia   . CAD (coronary artery disease)   . Chronic low back pain   . Cirrhosis of liver not due to alcohol ([place])   . Colon polyps   . GERD (gastroesophageal reflux disease)   . Hyperlipidemia   . Hypertension   . IDA (iron deficiency anemia)   . 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 Sleep apnea   . SOB (shortness of breath)   . Thrombocytopenia ([place])     PAST SURGICAL HISTORY:   Past Surgical History:  Procedure Laterality Date  . COLONOSCOPY  10/24/2013, 2002   hyperplastic polpys  . CORONARY ARTERY BYPASS GRAFT  09/1998  . ESOPHAGOGASTRODUODENOSCOPY ENDOSCOPY  05/24/2011  . hemorroid surgery    . TOTAL HIP ARTHROPLASTY Left 03/2003    SOCIAL HISTORY:   Social History  Substance Use Topics  . Smoking status: Never Smoker  . Smokeless tobacco: Never Used  . Alcohol use No    FAMILY HISTORY:   Family History  Problem Relation Age of Onset  . Heart disease    . Hypertension    . Anemia    . Colon cancer    . CVA Mother   . Dementia Mother   .  CAD Father     DRUG ALLERGIES:   Allergies  Allergen Reactions  . Latex Other (See Comments)  . Morphine And Related Nausea And Vomiting  . Adhesive [Tape] Rash  . Other Rash    NEOPRENE, TAPE, BAND-AID TOUGH STRIPS    REVIEW OF SYSTEMS:  CONSTITUTIONAL: No fever. Positive for cold feeling. Positive for weight loss. Positive for right leg weakness.  EYES: Right eye blurred vision. EARS, NOSE, AND THROAT: No tinnitus or ear pain. No sore throat. Dysphagia to dry foods and gets choked on liquids. RESPIRATORY: Positive for cough and shortness of breath. No wheezing or hemoptysis.  CARDIOVASCULAR: No chest pain, positive for orthopnea and edema.  GASTROINTESTINAL: No nausea, vomiting, diarrhea or abdominal pain. No blood in bowel movements GENITOURINARY: No dysuria, hematuria.  ENDOCRINE: No polyuria, nocturia, positive for hypothyroidism HEMATOLOGY: History of anemia and thrombocytopenia, positive for easy bruising or bleeding SKIN: No rash or lesion. MUSCULOSKELETAL: No joint pain or arthritis.   NEUROLOGIC: No tingling, numbness, weakness.  PSYCHIATRY: No anxiety or depression.   MEDICATIONS AT HOME:   Prior to Admission medications   Medication Sig Start Date End Date Taking? Authorizing Provider  amLODipine (NORVASC) 5 MG tablet TAKE 1 TABLET DAILY FOR BLOOD PRESSURE 03/10/15  Yes Historical Provider, MD  aspirin EC  81 MG tablet Take 81 mg by mouth daily.   Yes Historical Provider, MD  Calcium Carbonate-Vitamin D (CALCIUM-VITAMIN D) 500-200 MG-UNIT tablet Take 1 tablet by mouth daily.   Yes Historical Provider, MD  folic acid (FOLVITE) 161 MCG tablet Take 400 mcg by mouth daily.    Yes Historical Provider, MD  furosemide (LASIX) 40 MG tablet TAKE 1 TABLET DAILY AS NEEDED 02/27/15  Yes Historical Provider, MD  gabapentin (NEURONTIN) 100 MG capsule Take 200 mg by mouth at bedtime.  01/26/15  Yes Historical Provider, MD  lactulose, encephalopathy, (CHRONULAC) 10 GM/15ML SOLN Take  10 g by mouth daily as needed.  09/27/15  Yes Historical Provider, MD  levothyroxine (SYNTHROID, LEVOTHROID) 50 MCG tablet TAKE 1 TABLET DAILY 03/31/15  Yes Historical Provider, MD  lisinopril (PRINIVIL,ZESTRIL) 40 MG tablet TAKE 1 TABLET DAILY 03/31/15  Yes Historical Provider, MD  meclizine (ANTIVERT) 12.5 MG tablet Take 12.5 mg by mouth as needed. Reported on 10/14/2015   Yes Historical Provider, MD  Melatonin 3 MG TABS Take 3 mg by mouth as needed.   Yes Historical Provider, MD  metoprolol succinate (TOPROL-XL) 50 MG 24 hr tablet TAKE 1 TABLET DAILY 03/10/15  Yes Historical Provider, MD  Multiple Vitamin (MULTI-VITAMINS) TABS Take by mouth.   Yes Historical Provider, MD  omeprazole (PRILOSEC) 20 MG capsule TAKE 1 CAPSULE DAILY 03/24/15  Yes Historical Provider, MD  rifaximin (XIFAXAN) 550 MG TABS tablet Take 550 mg by mouth 2 (two) times daily.  01/20/15  Yes Historical Provider, MD  simvastatin (ZOCOR) 20 MG tablet TAKE 1 TABLET EVERY EVENING FOR CHOLESTEROL 03/24/15  Yes Historical Provider, MD  vitamin C (ASCORBIC ACID) 500 MG tablet Take 500 mg by mouth as needed.    Yes Historical Provider, MD  hydrochlorothiazide (HYDRODIURIL) 25 MG tablet TAKE ONE-HALF (1/2) TABLET DAILY 11/20/14   Historical Provider, MD      VITAL SIGNS:  Blood pressure (!) 119/53, pulse (!) 54, temperature 98 F (36.7 C), resp. rate 18, height 5' (1.524 m), weight 79.9 kg (176 lb 2 oz), SpO2 95 %.  PHYSICAL EXAMINATION:  GENERAL:  76 y.o.-year-old patient lying in the bed with no acute distress.  EYES: Pupils equal, round, reactive to light and accommodation. No scleral icterus. Extraocular muscles intact.  HEENT: Head atraumatic, normocephalic. Oropharynx and nasopharynx clear.  NECK:  Supple, no jugular venous distention. No thyroid enlargement, no tenderness.  LUNGS: Decreased breath sounds bilaterally, no wheezing, rales,rhonchi or crepitation. No use of accessory muscles of respiration.  CARDIOVASCULAR: S1, S2  normal. No murmurs, rubs, or gallops.  ABDOMEN: Soft, nontender, distended. Bowel sounds present. No organomegaly or mass.  EXTREMITIES: Trace pedal edema, no cyanosis, or clubbing.  NEUROLOGIC:  Speech seems kind of slurred to me. Tongue deviation to the right. Muscle strength 4/5 in right lower extremity. 5 out of 5 power left sided and right upper extremity. Babinski negative bilaterally. Sensation intact. Gait not checked.  PSYCHIATRIC: The patient is alert and oriented x 3.  SKIN: No rash, lesion, or ulcer.   LABORATORY PANEL:   CBC  Recent Labs Lab 04/22/16 1318  WBC 3.6  HGB 11.8*  HCT 35.0  PLT 63*   ------------------------------------------------------------------------------------------------------------------  Chemistries   Recent Labs Lab 04/22/16 1318  NA 126*  K 3.8  CL 85*  CO2 33*  GLUCOSE 690*  BUN 25*  CREATININE 1.68*  CALCIUM 9.1  AST 45*  ALT 35  ALKPHOS 115  BILITOT 1.9*   ------------------------------------------------------------------------------------------------------------------  Cardiac Enzymes  Recent Labs Lab 04/22/16 1318  TROPONINI <0.03   ------------------------------------------------------------------------------------------------------------------  RADIOLOGY:  Ct Head Wo Contrast  Result Date: 04/22/2016 CLINICAL DATA:  Five days of right-sided weakness and trouble swallowing. EXAM: CT HEAD WITHOUT CONTRAST TECHNIQUE: Contiguous axial images were obtained from the base of the skull through the vertex without intravenous contrast. COMPARISON:  MRI of the head 11/29/2012 FINDINGS: Brain: No evidence of intracranial hemorrhage, hydrocephalus, extra-axial collection or mass lesion/mass effect. Heterogeneous appearance of the pons and medulla oblongata may represent chronic ischemic changes, however acute infarction cannot be excluded. Chronic lacunar infarct in left basal ganglia noted. Marked brain parenchymal volume loss. Mild  periventricular microangiopathy. Vascular: Heavy calcific atherosclerotic disease at the skullbase. Skull: Negative for fracture or focal lesion. Sinuses/Orbits: No acute finding. Other: None. IMPRESSION: Heterogeneous appearance of the pons and medulla oblongata, which may represent chronic ischemic changes, however acute infarction cannot be excluded. Marked brain parenchymal volume loss. Mild periventricular microangiopathy. Heavy calcific intracranial atherosclerotic disease. Electronically Signed   By: Fidela Salisbury M.D.   On: 04/22/2016 13:51    EKG:   Sinus bradycardia 53 bpm with sinus arrhythmia, LVH  IMPRESSION AND PLAN:   1. Acute stroke with right leg weakness, slurred speech, dysphagia and tongue deviation to the right. Obtain an MRI of the brain, carotid ultrasound and echocardiogram. Take a step up from aspirin to Plavix. Change Zocor over to high dose Lipitor. Check a lipid panel in the morning. Physical therapy occupational therapy and speech therapy consultations. 2. Uncontrolled diabetes mellitus. Add on hemoglobin A1c to existing labs. Start low-dose Lantus. Give 10 units of IV insulin and sliding scale for now. ER physician ordered a fluid bolus. Gentle fluids with the patient and shortness of breath. Nursing must teach how to inject insulin. 3. Essential hypertension. Blood pressure on the lower side I will hold all antihypertensive medications right now. 4. Acute kidney injury with dehydration. Gentle IV fluid hydration and hold antihypertensive medications 5. Hyponatremia secondary to elevated sugars. Stop hydrochlorothiazide also. 6. Cirrhosis of liver, thrombocytopenia and anemia. Continue Xifaxan. And on ammonia level. 7. History of coronary artery disease 8. Hypothyroidism unspecified on levothyroxine 9. Shortness of breath. Obtain a chest x-ray 10. MGUS    All the records are reviewed and case discussed with ED provider. Management plans discussed with the  patient, family and they are in agreement.  CODE STATUS: Full code  TOTAL TIME TAKING CARE OF THIS PATIENT: 50 minutes.    Loletha Grayer M.D on 04/22/2016 at 3:51 PM  Between 7am to 6pm - Pager - [phone]  After 6pm call admission pager [phone]  [facility]  [phone]  CC: Primary care physician; Tracie Harrier, MD

## 2016-04-22 NOTE — Progress Notes (Signed)
[facility] OFFICE PROGRESS NOTE  Patient Care Team: Tracie Harrier, MD as PCP - General (Internal Medicine)   SUMMARY OF HEMATOLOGIC- ONCOLOGIC HISTORY:  # Anemia iron deficiency- ? Etiology  # MGUS IgGKappa[BMBx- 2010- slightly hypercellular ~ 50%; 4% plasma cells; cytogen-N]; April 2016-M protein -0.8gm/dl;  # Chronic thrombocytopenia [70-100s]/sec  To hypersplenism  # Cirrhosis- non-alcoholic [CT-Aug 9390]  INTERVAL HISTORY:  Patient states that beginning one week ago she noticed right sided weakness of both her arm and leg with some mild dysphagia. She states it was just  "harder to move the right side, more then the left". She did not take or call anyone when these symptoms occurred. She states that the symptoms have become better since last Friday but she still is not back to 100%. She states she has had a stroke in the past. Patient's appetite is good. No blood in stools or black stools. Denies any abdominal swelling. No worsening swelling in the legs. She is not losing any weight or losing any weight. She denies any bleeding. She is not on iron because of history of cirrhosis. She denies any fatigue. No bone pain. No nausea no vomiting.  REVIEW OF SYSTEMS:  A complete 10 point review of system is done which is negative except mentioned above/history of present illness.   PAST MEDICAL HISTORY :  Past Medical History:  Diagnosis Date  . Anemia   . CAD (coronary artery disease)   . Chronic low back pain   . Cirrhosis of liver not due to alcohol ([place])   . Colon polyps   . GERD (gastroesophageal reflux disease)   . Hyperlipidemia   . Hypertension   . IDA (iron deficiency anemia)   . 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 Sleep apnea   . SOB (shortness of breath)   . Thrombocytopenia ([place])     PAST SURGICAL HISTORY :   Past Surgical History:  Procedure Laterality Date  . COLONOSCOPY  10/24/2013, 2002   hyperplastic polpys  .  CORONARY ARTERY BYPASS GRAFT  09/1998  . ESOPHAGOGASTRODUODENOSCOPY ENDOSCOPY  05/24/2011  . hemorroid surgery    . TOTAL HIP ARTHROPLASTY Left 03/2003    FAMILY HISTORY :   Family History  Problem Relation Age of Onset  . Heart disease    . Hypertension    . Anemia    . Colon cancer    . CVA Mother   . Dementia Mother   . CAD Father     SOCIAL HISTORY:   Social History  Substance Use Topics  . Smoking status: Never Smoker  . Smokeless tobacco: Never Used  . Alcohol use No    ALLERGIES:  is allergic to latex; morphine and related; adhesive [tape]; and other.  MEDICATIONS:  No current facility-administered medications for this visit.    Current Outpatient Prescriptions  Medication Sig Dispense Refill  . amLODipine (NORVASC) 5 MG tablet TAKE 1 TABLET DAILY FOR BLOOD PRESSURE    . aspirin EC 81 MG tablet Take 81 mg by mouth daily.    . Calcium Carbonate-Vitamin D (CALCIUM-VITAMIN D) 500-200 MG-UNIT tablet Take 1 tablet by mouth daily.    . folic acid (FOLVITE) 300 MCG tablet Take 400 mcg by mouth daily.     . furosemide (LASIX) 40 MG tablet TAKE 1 TABLET DAILY AS NEEDED    . gabapentin (NEURONTIN) 100 MG capsule Take 200 mg by mouth at bedtime.     . hydrochlorothiazide (HYDRODIURIL) 25 MG tablet  TAKE ONE-HALF (1/2) TABLET DAILY    . lactulose, encephalopathy, (CHRONULAC) 10 GM/15ML SOLN Take 10 g by mouth daily as needed.     Marland Kitchen levothyroxine (SYNTHROID, LEVOTHROID) 50 MCG tablet TAKE 1 TABLET DAILY    . lisinopril (PRINIVIL,ZESTRIL) 40 MG tablet TAKE 1 TABLET DAILY    . meclizine (ANTIVERT) 12.5 MG tablet Take 12.5 mg by mouth as needed. Reported on 10/14/2015    . Melatonin 3 MG TABS Take 3 mg by mouth as needed.    . metoprolol succinate (TOPROL-XL) 50 MG 24 hr tablet TAKE 1 TABLET DAILY    . Multiple Vitamin (MULTI-VITAMINS) TABS Take by mouth.    Marland Kitchen omeprazole (PRILOSEC) 20 MG capsule TAKE 1 CAPSULE DAILY    . rifaximin (XIFAXAN) 550 MG TABS tablet Take 550 mg by mouth 2  (two) times daily.     . simvastatin (ZOCOR) 20 MG tablet TAKE 1 TABLET EVERY EVENING FOR CHOLESTEROL    . vitamin C (ASCORBIC ACID) 500 MG tablet Take 500 mg by mouth as needed.      Facility-Administered Medications Ordered in Other Visits  Medication Dose Route Frequency Provider Last Rate Last Dose  .  stroke: mapping our early stages of recovery book   Does not apply Once Loletha Grayer, MD      . 0.9 %  sodium chloride infusion   Intravenous Continuous Loletha Grayer, MD      . atorvastatin (LIPITOR) tablet 40 mg  40 mg Oral q1800 Loletha Grayer, MD      . Derrill Memo ON 04/23/2016] calcium-vitamin D 500-200 MG-UNIT per tablet 1 tablet  1 tablet Oral Daily Loletha Grayer, MD      . clopidogrel (PLAVIX) tablet 75 mg  75 mg Oral Daily Loletha Grayer, MD      . enoxaparin (LOVENOX) injection 40 mg  40 mg Subcutaneous Q24H Loletha Grayer, MD      . Derrill Memo ON 1/91/4782] folic acid (FOLVITE) tablet 400 mcg  400 mcg Oral Daily Loletha Grayer, MD      . gabapentin (NEURONTIN) capsule 200 mg  200 mg Oral QHS Loletha Grayer, MD      . insulin aspart (novoLOG) injection 0-5 Units  0-5 Units Subcutaneous QHS Loletha Grayer, MD      . insulin aspart (novoLOG) injection 0-9 Units  0-9 Units Subcutaneous TID WC Loletha Grayer, MD      . insulin aspart (novoLOG) injection 10 Units  10 Units Intravenous Once Loletha Grayer, MD      . insulin glargine (LANTUS) injection 18 Units  18 Units Subcutaneous Once Loletha Grayer, MD      . Derrill Memo ON 04/23/2016] lactulose (CHRONULAC) 10 GM/15ML solution 10 g  10 g Oral Daily Loletha Grayer, MD      . Derrill Memo ON 04/23/2016] levothyroxine (SYNTHROID, LEVOTHROID) tablet 50 mcg  50 mcg Oral QAC breakfast Loletha Grayer, MD      . LORazepam (ATIVAN) injection 0.5 mg  0.5 mg Intravenous Once Loletha Grayer, MD      . meclizine (ANTIVERT) tablet 12.5 mg  12.5 mg Oral PRN Loletha Grayer, MD      . Melatonin TABS 3 mg  3 mg Oral QHS Loletha Grayer, MD      .  ondansetron [facility]) tablet 4 mg  4 mg Oral Q6H PRN Loletha Grayer, MD       Or  . ondansetron [facility]) injection 4 mg  4 mg Intravenous Q6H PRN Loletha Grayer, MD      . Derrill Memo  ON 04/23/2016] pantoprazole (PROTONIX) EC tablet 40 mg  40 mg Oral Daily Loletha Grayer, MD      . rifaximin Doreene Nest) tablet 550 mg  550 mg Oral BID Loletha Grayer, MD        PHYSICAL EXAMINATION:   BP (!) 114/57 (BP Location: Left Arm, Patient Position: Sitting)   Pulse 77   Temp 97 F (36.1 C) (Tympanic)   Wt 176 lb 2 oz (79.9 kg)   BMI 34.40 kg/m   Filed Weights   04/22/16 1219  Weight: 176 lb 2 oz (79.9 kg)    GENERAL: Well-nourished well-developed; Alert, no distress and comfortable.  Accompanied by her husband.  EYES: no pallor or icterus OROPHARYNX: no thrush or ulceration; good dentition  NECK: supple, no masses felt LYMPH:  no palpable lymphadenopathy in the cervical, axillary or inguinal regions LUNGS: clear to auscultation and  No wheeze or crackles HEART/CVS: regular rate & rhythm and no murmurs; No lower extremity edema ABDOMEN:abdomen soft, non-tender and normal bowel sounds; positive for splenomegaly.   Musculoskeletal:no cyanosis of digits and no clubbing  PSYCH: alert & oriented x 3 with fluent speech NEURO: Gait unsteady, Pronator drift right sided, Right and left sided extremities equal in strength, no facial droop, PERRLA, No difficulty swallowing. SKIN:  no rashes or significant lesions  LABORATORY DATA:  I have reviewed the data as listed    Component Value Date/Time   NA 126 (L) 04/22/2016 1318   NA 141 02/03/2014 0643   K 3.8 04/22/2016 1318   K 3.6 02/03/2014 0643   CL 85 (L) 04/22/2016 1318   CL 109 (H) 02/03/2014 0643   CO2 33 (H) 04/22/2016 1318   CO2 25 02/03/2014 0643   GLUCOSE 690 (HH) 04/22/2016 1318   GLUCOSE 93 02/03/2014 0643   BUN 25 (H) 04/22/2016 1318   BUN 15 02/03/2014 0643   CREATININE 1.68 (H) 04/22/2016 1318   CREATININE 0.99 07/14/2014 1454    CALCIUM 9.1 04/22/2016 1318   CALCIUM 8.6 (L) 07/14/2014 1454   PROT 7.8 04/22/2016 1318   PROT 7.1 11/28/2012 1935   ALBUMIN 3.7 04/22/2016 1318   ALBUMIN 3.1 (L) 11/28/2012 1935   AST 45 (H) 04/22/2016 1318   AST 40 (H) 11/28/2012 1935   ALT 35 04/22/2016 1318   ALT 37 11/28/2012 1935   ALKPHOS 115 04/22/2016 1318   ALKPHOS 135 11/28/2012 1935   BILITOT 1.9 (H) 04/22/2016 1318   BILITOT 0.8 11/28/2012 1935   GFRNONAA 28 (L) 04/22/2016 1318   GFRNONAA 56 (L) 07/14/2014 1454   GFRAA 33 (L) 04/22/2016 1318   GFRAA >60 07/14/2014 1454    No results found for: SPEP, UPEP  Lab Results  Component Value Date   WBC 3.6 04/22/2016   NEUTROABS 2.6 04/22/2016   HGB 11.8 (L) 04/22/2016   HCT 35.0 04/22/2016   MCV 86.3 04/22/2016   PLT 63 (L) 04/22/2016      Chemistry      Component Value Date/Time   NA 126 (L) 04/22/2016 1318   NA 141 02/03/2014 0643   K 3.8 04/22/2016 1318   K 3.6 02/03/2014 0643   CL 85 (L) 04/22/2016 1318   CL 109 (H) 02/03/2014 0643   CO2 33 (H) 04/22/2016 1318   CO2 25 02/03/2014 0643   BUN 25 (H) 04/22/2016 1318   BUN 15 02/03/2014 0643   CREATININE 1.68 (H) 04/22/2016 1318   CREATININE 0.99 07/14/2014 1454      Component Value Date/Time  CALCIUM 9.1 04/22/2016 1318   CALCIUM 8.6 (L) 07/14/2014 1454   ALKPHOS 115 04/22/2016 1318   ALKPHOS 135 11/28/2012 1935   AST 45 (H) 04/22/2016 1318   AST 40 (H) 11/28/2012 1935   ALT 35 04/22/2016 1318   ALT 37 11/28/2012 1935   BILITOT 1.9 (H) 04/22/2016 1318   BILITOT 0.8 11/28/2012 1935       RADIOGRAPHIC STUDIES: I have personally reviewed the radiological images as listed and agreed with the findings in the report. Dg Chest 2 View  Result Date: 04/22/2016 CLINICAL DATA:  76 year old female with shortness of breath EXAM: CHEST  2 VIEW COMPARISON:  Prior chest x-ray 02/02/2016 FINDINGS: Surgical changes of prior median sternotomy for CABG. The heart is normal in size. Stable hyperinflation and  mild central bronchitic changes. No focal airspace consolidation, pulmonary edema, pleural effusion or pneumothorax. No acute osseous abnormality. IMPRESSION: 1. No acute cardiopulmonary process. 2. Stable hyperinflation and bronchitic changes. Electronically Signed   By: Jacqulynn Cadet M.D.   On: 04/22/2016 16:44   Ct Head Wo Contrast  Result Date: 04/22/2016 CLINICAL DATA:  Five days of right-sided weakness and trouble swallowing. EXAM: CT HEAD WITHOUT CONTRAST TECHNIQUE: Contiguous axial images were obtained from the base of the skull through the vertex without intravenous contrast. COMPARISON:  MRI of the head 11/29/2012 FINDINGS: Brain: No evidence of intracranial hemorrhage, hydrocephalus, extra-axial collection or mass lesion/mass effect. Heterogeneous appearance of the pons and medulla oblongata may represent chronic ischemic changes, however acute infarction cannot be excluded. Chronic lacunar infarct in left basal ganglia noted. Marked brain parenchymal volume loss. Mild periventricular microangiopathy. Vascular: Heavy calcific atherosclerotic disease at the skullbase. Skull: Negative for fracture or focal lesion. Sinuses/Orbits: No acute finding. Other: None. IMPRESSION: Heterogeneous appearance of the pons and medulla oblongata, which may represent chronic ischemic changes, however acute infarction cannot be excluded. Marked brain parenchymal volume loss. Mild periventricular microangiopathy. Heavy calcific intracranial atherosclerotic disease. Electronically Signed   By: Fidela Salisbury M.D.   On: 04/22/2016 13:51     ASSESSMENT & PLAN:  MGUS (monoclonal gammopathy of unknown significance) # Anemia-  Likely iron deficiency of unclear etiology. Hb. 11.7 and ferritin 36. Deferred until next appointment due to acute right sided weaknesses.   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 Deferred for now due to acute right sided weakness as mentioned below.   #  Chronic thrombocytopenia-  Today platelets are 63. Decreased but stable.   #  Cirrhosis/ nonalcoholic-  Discussed the risk of  Hepatocellular cancer in patient cirrhosis.. Will get AFP in 6 months; [place]- March 2017- no lesions noted.   # Hyperglycemia- Blood sugar 350 during lab draw. Will defer at this time due to right sided weakness and visit to emergency room.   # Right sided weakness: Sent to emergency room for further evaluation. On examination, noted have right sided pronator drift with unsteady gait. Although normal rate, abnormal heart rhythm auscultated and palpated. Would feel better if she was worked up in the ED due to hx of CVA.      Jacquelin Hawking, NP 04/22/2016 4:55 PM

## 2016-04-22 NOTE — Progress Notes (Signed)
Patient here today for follow up.  Patient has had unsteady gait, shuffling and dragging right foot and per husband "not thinking clearly" that started last Friday, pt is unsure if she's had another stroke.

## 2016-04-22 NOTE — Progress Notes (Signed)
Anticoagulation monitoring(Lovenox):  76yo  female ordered Lovenox 40 mg Q24h for DVT prevention  Filed Weights   04/22/16 1312 04/22/16 1853  Weight: 176 lb 2 oz (79.9 kg) 176 lb 2 oz (79.9 kg)   BMI     Lab Results  Component Value Date   CREATININE 1.68 (H) 04/22/2016   CREATININE 1.20 (H) 04/06/2016   CREATININE 0.99 07/14/2014   Estimated Creatinine Clearance: 26.3 mL/min (by C-G formula based on SCr of 1.68 mg/dL (H)). Hemoglobin & Hematocrit     Component Value Date/Time   HGB 11.8 (L) 04/22/2016 1318   HGB 12.2 07/14/2014 1454   HCT 35.0 04/22/2016 1318   HCT 36.0 07/14/2014 1454     Per Protocol for Patient with estCrcl < 30 ml/min and BMI < 40, will transition to Lovenox 30 mg Q24h     Paulina Fusi, PharmD, BCPS 04/22/2016 7:05 PM

## 2016-04-22 NOTE — ED Triage Notes (Signed)
Pt states x 5 days R sided weakness and trouble swallowing. States saw eye doctor Monday, says R eye is cloudy. States previous cataract surgery in both eyes. Sent from cancer center today for R sided weakness and trouble swallowing. Pt sees a hematologist there. Pt denies facial droop, trouble speaking. Pt states she has been dragging R foot some. Hx of stroke "mild" a year a half ago, did not receive TPA.

## 2016-04-23 ENCOUNTER — Inpatient Hospital Stay
Admit: 2016-04-23 | Discharge: 2016-04-23 | Disposition: A | Discharge status: Home / Self Care | Payer: Medicare Other | Attending: Internal Medicine | Admitting: Internal Medicine

## 2016-04-23 LAB — BASIC METABOLIC PANEL
Anion gap: 6 (ref 5–15)
BUN: 20 mg/dL (ref 6–20)
CHLORIDE: 96 mmol/L — AB (ref 101–111)
CO2: 31 mmol/L (ref 22–32)
CREATININE: 1.41 mg/dL — AB (ref 0.44–1.00)
Calcium: 8.6 mg/dL — ABNORMAL LOW (ref 8.9–10.3)
GFR calc Af Amer: 40 mL/min — ABNORMAL LOW (ref >60)
GFR calc non Af Amer: 35 mL/min — ABNORMAL LOW (ref >60)
GLUCOSE: 201 mg/dL — AB (ref 65–99)
Potassium: 2.8 mmol/L — ABNORMAL LOW (ref 3.5–5.1)
Sodium: 133 mmol/L — ABNORMAL LOW (ref 135–145)

## 2016-04-23 LAB — CBC
HCT: 30.9 % — ABNORMAL LOW (ref 35.0–47.0)
Hemoglobin: 11 g/dL — ABNORMAL LOW (ref 12.0–16.0)
MCH: 29.5 pg (ref 26.0–34.0)
MCHC: 35.5 g/dL (ref 32.0–36.0)
MCV: 83.1 fL (ref 80.0–100.0)
PLATELETS: 52 10*3/uL — AB (ref 150–440)
RBC: 3.73 MIL/uL — ABNORMAL LOW (ref 3.80–5.20)
RDW: 16.3 % — AB (ref 11.5–14.5)
WBC: 2.9 10*3/uL — ABNORMAL LOW (ref 3.6–11.0)

## 2016-04-23 LAB — LIPID PANEL
Cholesterol: 195 mg/dL (ref 0–200)
HDL: 43 mg/dL (ref >40)
LDL CALC: 138 mg/dL — AB (ref 0–99)
Total CHOL/HDL Ratio: 4.5 RATIO
Triglycerides: 70 mg/dL (ref <150)
VLDL: 14 mg/dL (ref 0–40)

## 2016-04-23 LAB — MAGNESIUM: MAGNESIUM: 1.8 mg/dL (ref 1.7–2.4)

## 2016-04-23 LAB — PHOSPHORUS: Phosphorus: 2.8 mg/dL (ref 2.5–4.6)

## 2016-04-23 LAB — ECHOCARDIOGRAM COMPLETE
HEIGHTINCHES: 60 in
Weight: 2818 oz

## 2016-04-23 LAB — GLUCOSE, CAPILLARY
GLUCOSE-CAPILLARY: 186 mg/dL — AB (ref 65–99)
GLUCOSE-CAPILLARY: 343 mg/dL — AB (ref 65–99)
Glucose-Capillary: 200 mg/dL — ABNORMAL HIGH (ref 65–99)
Glucose-Capillary: 315 mg/dL — ABNORMAL HIGH (ref 65–99)

## 2016-04-23 LAB — HEMOGLOBIN A1C
Hgb A1c MFr Bld: 13.1 % — ABNORMAL HIGH (ref 4.8–5.6)
MEAN PLASMA GLUCOSE: 329 mg/dL

## 2016-04-23 MED ORDER — INSULIN GLARGINE 100 UNIT/ML ~~LOC~~ SOLN
20.0000 [IU] | Freq: Every day | SUBCUTANEOUS | Status: DC
  Administered 2016-04-23: 20 [IU] via SUBCUTANEOUS
  Filled 2016-04-23 (×2): qty 0.2

## 2016-04-23 MED ORDER — LIVING WELL WITH DIABETES BOOK
Freq: Once | Status: AC
Start: 2016-04-23 — End: 2016-04-23
  Administered 2016-04-23: 10:00:00 1
  Filled 2016-04-23: qty 1

## 2016-04-23 MED ORDER — INSULIN STARTER KIT- PEN NEEDLES (ENGLISH)
1.0000 | Freq: Once | Status: AC
  Administered 2016-04-23: 1
  Filled 2016-04-23: qty 1

## 2016-04-23 MED ORDER — POTASSIUM CHLORIDE CRYS ER 20 MEQ PO TBCR
40.0000 meq | EXTENDED_RELEASE_TABLET | ORAL | Status: AC
  Administered 2016-04-23 (×2): 40 meq via ORAL
  Filled 2016-04-23 (×2): qty 2

## 2016-04-23 NOTE — Progress Notes (Signed)
Inpatient Diabetes Program Recommendations  AACE/ADA: New Consensus Statement on Inpatient Glycemic Control (2015)  Target Ranges:  Prepandial:   less than 140 mg/dL      Peak postprandial:   less than 180 mg/dL (1-2 hours)      Critically ill patients:  140 - 180 mg/dL   Lab Results  Component Value Date   GLUCAP 186 (H) 04/23/2016   HGBA1C 13.1 (H) 04/22/2016    Review of Glycemic Control Results for Theresa [name], Theresa [name] (MRN [id number]) as of 04/23/2016 08:24  Ref. Range 04/22/2016 20:37 04/23/2016 07:19  Glucose-Capillary Latest Ref Range: 65 - 99 mg/dL 212 (H) 186 (H)    Inpatient Diabetes Program Recommendations:  Received consult regarding new onset diabetes. Have ordered Living Well With Diabetes and insulin starter kit with pen needles. Spoke with RN Opal Sidles regarding starting diabetes teaching with insulin administration and allow patient to give own injections when trained. Nurses unable to teach insulin pen @ this time due to not trained on administration. Diabetes Coordinator to followup on Monday am.  Thank you, Nezzie Manera. Cem Kosman, RN, MSN, CDE Inpatient Glycemic Control Team Team Pager [phone] (8am-5pm) 04/23/2016 8:32 AM

## 2016-04-23 NOTE — Progress Notes (Addendum)
Speech clear, eating without difficulty. Stroke has been ruled out with new diagnosis of diabetes. Written diabetic booklet issued. Began teaching on insulin injection sites, diet, monitoring blood sugar. Demonstrated insulin injection x 1. Pt injected self with pt unable to visualize eye of needle, accurately select specific site and visualize when injection administration in. Husband observed. Pt has vision impairment. Diabetic coordinator contacted this nurse- discussed concern over proper education of insulin pen kit with no staff educated on this including Malachy Mood, house supervisor, who is also aware. Will do education on insulin administration as available at this time. ECHO done.  FSBS's improving. no acute distress at this time. Wearing 02 currently. K+ oral supplement given for K+ 2.8. IVF rate increased to 75 ml/hr.

## 2016-04-23 NOTE — Evaluation (Signed)
Physical Therapy Evaluation Patient Details Name: Theresa [name] MRN: [id number] DOB: [birth year] Today's Date: 04/23/2016   History of Present Illness  76 yo female with onset of R side weakness was admitted for testing, AKI and has chronic lacunar and cerebellar infarcts, with PMHx:  monoclonal gammopathy, cirrhosis, SOB, CAD  Clinical Impression  Pt is able to walk with PT with care, due to her dizziness with standing activity and her unsteadiness.  Pt is in her room with husband present to observe and ask questions, and is going to be her caregiver.  Will plan for home transition as pt is able to walk with minor help, but will need HHPT to follow her for further balance training and even to outpatient as needed.  Follow acutely for same issues, and will monitor her control of standing and vitals to ensure nothing is contributing to dizziness.    Follow Up Recommendations Home health PT;Supervision for mobility/OOB    Equipment Recommendations  Rolling walker with 5" wheels    Recommendations for Other Services       Precautions / Restrictions Precautions Precautions: Fall (telemetry) Restrictions Weight Bearing Restrictions: No      Mobility  Bed Mobility Overal bed mobility: Needs Assistance Bed Mobility: Supine to Sit     Supine to sit: Min assist     General bed mobility comments: help to support trunk mainly to side of bed  Transfers Overall transfer level: Needs assistance Equipment used: Rolling walker (2 wheeled);1 person hand held assist Transfers: Sit to/from [facility] Sit to Stand: Min guard Stand pivot transfers: Min guard       General transfer comment: pt is able to assist standing but not steady due to dizziness  Ambulation/Gait Ambulation/Gait assistance: Min assist Ambulation Distance (Feet): 25 Feet (x2) Assistive device: Rolling walker (2 wheeled);1 person hand held assist Gait Pattern/deviations: Step-through pattern;Trunk  flexed;Narrow base of support;Decreased stride length Gait velocity: reduced Gait velocity interpretation: Below normal speed for age/gender General Gait Details: pt is needing some steadying on the walker and is safer with help, able to maneuver in the confined spaces of her room with help  Stairs            Wheelchair Mobility    Modified Rankin (Stroke Patients Only)       Balance Overall balance assessment: Needs assistance Sitting-balance support: Feet supported Sitting balance-Leahy Scale: Fair     Standing balance support: Bilateral upper extremity supported Standing balance-Leahy Scale: Poor                               Pertinent Vitals/Pain Pain Assessment: No/denies pain    Home Living Family/patient expects to be discharged to:: Private residence Living Arrangements: Spouse/significant other Available Help at Discharge: Family;Available 24 hours/day Type of Home: House Home Access: Stairs to enter Entrance Stairs-Rails: Right Entrance Stairs-Number of Steps: 1 Home Layout: One level Home Equipment: None      Prior Function Level of Independence: Independent               Hand Dominance   Dominant Hand: Right    Extremity/Trunk Assessment   Upper Extremity Assessment Upper Extremity Assessment: Overall WFL for tasks assessed    Lower Extremity Assessment Lower Extremity Assessment: Overall WFL for tasks assessed    Cervical / Trunk Assessment Cervical / Trunk Assessment: Normal  Communication   Communication: No difficulties  Cognition Arousal/Alertness: Awake/alert Behavior During Therapy:  WFL for tasks assessed/performed Overall Cognitive Status: Within Functional Limits for tasks assessed                      General Comments      Exercises     Assessment/Plan    PT Assessment Patient needs continued PT services  PT Problem List Decreased range of motion;Decreased activity tolerance;Decreased  balance;Decreased mobility;Decreased coordination;Decreased knowledge of use of DME          PT Treatment Interventions DME instruction;Gait training;Stair training;Functional mobility training;Therapeutic activities;Therapeutic exercise;Balance training;Neuromuscular re-education;Patient/family education    PT Goals (Current goals can be found in the Care Plan section)  Acute Rehab PT Goals Patient Stated Goal: to feel better and to get some sleep PT Goal Formulation: With patient/family Time For Goal Achievement: 05/07/16 Potential to Achieve Goals: Good    Frequency 7X/week   Barriers to discharge   needs help from husband 24/7    Co-evaluation               End of Session Equipment Utilized During Treatment: Gait belt Activity Tolerance: Patient tolerated treatment well;Patient limited by fatigue;Treatment limited secondary to medical complications (Comment) (dizziness, O2 sats were 97% and pulse 70-82) Patient left: in chair;with call bell/phone within reach;with family/visitor present Nurse Communication: Mobility status         Time: [id number] PT Time Calculation (min) (ACUTE ONLY): 23 min   Charges:   PT Evaluation $PT Eval Low Complexity: 1 Procedure PT Treatments $Gait Training: 8-22 mins   PT G CodesRamond Dial 30-Apr-2016, 12:47 PM   Mee Hives, PT MS Acute Rehab Dept. Number: [place] [facility] [place]

## 2016-04-23 NOTE — Progress Notes (Signed)
*  PRELIMINARY RESULTS* Echocardiogram 2D Echocardiogram has been performed.  Theresa [name] 04/23/2016, 12:09 PM

## 2016-04-23 NOTE — Progress Notes (Signed)
Insulin pen starter kit issued to pt. Used demon insulin pen kit with pt and husband education done with pt able to prepare syringe, select site properly and perform demonstration with cueing. Both pt and husband able to demonstrated correctly dialing insulin dose. Pt and husband are more comfortable with insulin pen. Pt able to inject self with sliding scale insulin with cueing on holding needle and activating needle cover correctly.

## 2016-04-23 NOTE — Progress Notes (Signed)
[facility] [place] NAME: Theresa [name]    MR#:  [id number]  DATE OF BIRTH:  November 23, 1940  SUBJECTIVE:  CHIEF COMPLAINT:   Chief Complaint  Patient presents with  . Extremity Weakness   - Sent in from physician office for cloudy vision, weakness and gait changes. MRI negative for stroke. -Admission sugars were greater than 600- improving with Lantus  REVIEW OF SYSTEMS:  Review of Systems  Constitutional: Positive for malaise/fatigue. Negative for chills and fever.  HENT: Negative for congestion, ear discharge, hearing loss and nosebleeds.   Eyes: Positive for blurred vision.  Respiratory: Negative for cough, shortness of breath and wheezing.   Cardiovascular: Negative for chest pain, palpitations and leg swelling.  Gastrointestinal: Negative for abdominal pain, constipation, diarrhea, nausea and vomiting.  Genitourinary: Positive for urgency. Negative for dysuria.  Musculoskeletal: Negative for myalgias.  Neurological: Negative for dizziness, sensory change, speech change, focal weakness, seizures and headaches.  Psychiatric/Behavioral: Negative for depression.    DRUG ALLERGIES:   Allergies  Allergen Reactions  . Latex Other (See Comments)  . Morphine And Related Nausea And Vomiting  . Adhesive [Tape] Rash  . Other Rash    NEOPRENE, TAPE, BAND-AID TOUGH STRIPS    VITALS:  Blood pressure (!) 117/48, pulse (!) 57, temperature 97.8 F (36.6 C), temperature source Oral, resp. rate 20, height 5' (1.524 m), weight 79.9 kg (176 lb 2 oz), SpO2 98 %.  PHYSICAL EXAMINATION:  Physical Exam  GENERAL:  76 y.o.-year-old patient lying in the bed with no acute distress.  EYES: Pupils equal, round, reactive to light and accommodation. No scleral icterus. Extraocular muscles intact.  HEENT: Head atraumatic, normocephalic. Oropharynx and nasopharynx clear.  NECK:  Supple, no jugular venous distention. No thyroid enlargement, no tenderness.    LUNGS: Normal breath sounds bilaterally, no wheezing, rales,rhonchi or crepitation. No use of accessory muscles of respiration. Decreased bibasilar breath sounds CARDIOVASCULAR: S1, S2 normal. No murmurs, rubs, or gallops.  ABDOMEN: Soft, nontender, nondistended. Bowel sounds present. No organomegaly or mass.  EXTREMITIES: No pedal edema, cyanosis, or clubbing.  NEUROLOGIC: Cranial nerves II through XII are intact. Muscle strength 5/5 in all extremities. Sensation intact. Gait not checked.  PSYCHIATRIC: The patient is alert and oriented x 3.  SKIN: No obvious rash, lesion, or ulcer.    LABORATORY PANEL:   CBC  Recent Labs Lab 04/23/16 0607  WBC 2.9*  HGB 11.0*  HCT 30.9*  PLT 52*   ------------------------------------------------------------------------------------------------------------------  Chemistries   Recent Labs Lab 04/22/16 1318 04/23/16 0607  NA 126* 133*  K 3.8 2.8*  CL 85* 96*  CO2 33* 31  GLUCOSE 690* 201*  BUN 25* 20  CREATININE 1.68* 1.41*  CALCIUM 9.1 8.6*  AST 45*  --   ALT 35  --   ALKPHOS 115  --   BILITOT 1.9*  --    ------------------------------------------------------------------------------------------------------------------  Cardiac Enzymes  Recent Labs Lab 04/22/16 1318  TROPONINI <0.03   ------------------------------------------------------------------------------------------------------------------  RADIOLOGY:  Dg Chest 2 View  Result Date: 04/22/2016 CLINICAL DATA:  76 year old female with shortness of breath EXAM: CHEST  2 VIEW COMPARISON:  Prior chest x-ray 02/02/2016 FINDINGS: Surgical changes of prior median sternotomy for CABG. The heart is normal in size. Stable hyperinflation and mild central bronchitic changes. No focal airspace consolidation, pulmonary edema, pleural effusion or pneumothorax. No acute osseous abnormality. IMPRESSION: 1. No acute cardiopulmonary process. 2. Stable hyperinflation and bronchitic changes.  Electronically Signed   By: Myrle Sheng  Laurence Ferrari M.D.   On: 04/22/2016 16:44   Ct Head Wo Contrast  Result Date: 04/22/2016 CLINICAL DATA:  Five days of right-sided weakness and trouble swallowing. EXAM: CT HEAD WITHOUT CONTRAST TECHNIQUE: Contiguous axial images were obtained from the base of the skull through the vertex without intravenous contrast. COMPARISON:  MRI of the head 11/29/2012 FINDINGS: Brain: No evidence of intracranial hemorrhage, hydrocephalus, extra-axial collection or mass lesion/mass effect. Heterogeneous appearance of the pons and medulla oblongata may represent chronic ischemic changes, however acute infarction cannot be excluded. Chronic lacunar infarct in left basal ganglia noted. Marked brain parenchymal volume loss. Mild periventricular microangiopathy. Vascular: Heavy calcific atherosclerotic disease at the skullbase. Skull: Negative for fracture or focal lesion. Sinuses/Orbits: No acute finding. Other: None. IMPRESSION: Heterogeneous appearance of the pons and medulla oblongata, which may represent chronic ischemic changes, however acute infarction cannot be excluded. Marked brain parenchymal volume loss. Mild periventricular microangiopathy. Heavy calcific intracranial atherosclerotic disease. Electronically Signed   By: Fidela Salisbury M.D.   On: 04/22/2016 13:51   Mr Brain Wo Contrast  Result Date: 04/22/2016 CLINICAL DATA:  76 y/o F; unsteady gait and tracking of the right foot. History of cirrhosis of hyperglycemia. EXAM: MRI HEAD WITHOUT CONTRAST TECHNIQUE: Multiplanar, multiecho pulse sequences of the brain and surrounding structures were obtained without intravenous contrast. COMPARISON:  04/22/2016 CT head.  11/29/2012 MRI brain. FINDINGS: Brain: No acute infarction, hemorrhage, hydrocephalus, extra-axial collection or mass lesion. Small stable chronic lacunar infarcts within the bilateral cerebellar hemispheres and scattered throughout the basal ganglia. Stable moderate  chronic microvascular ischemic changes and parenchymal volume loss of the brain. There is enlargement of lateral and third ventricles which is stable and is probably commensurate to the degree of volume loss in the brain. Mild T1 shortening within the bilateral globus pallidus with T2 FLAIR hyperintense signal abnormality may represent manganese deposition in this setting of cirrhosis. Vascular: Normal flow voids. Skull and upper cervical spine: Normal marrow signal. Sinuses/Orbits: Paranasal sinus mucosal thickening moderate in right maxillary and left sphenoid sinus. No abnormal signal of mastoid air cells. Bilateral intra-ocular lens replacement. Other: None. IMPRESSION: 1. No acute intracranial abnormality. 2. T1 shortening within globus pallidus bilaterally may represent manganese deposition in the setting of cirrhosis. 3. Background of chronic cerebellar and basal ganglia lacunar infarcts, moderate chronic microvascular ischemic changes, and brain parenchymal volume loss. 4. Stable enlargement of lateral and third ventricles probably commensurate to the degree of parenchymal volume loss. 5. Mild paranasal sinus disease. Electronically Signed   By: Kristine Garbe M.D.   On: 04/22/2016 17:51   US Carotid Bilateral  Result Date: 04/22/2016 CLINICAL DATA:  Possible stroke. EXAM: BILATERAL CAROTID DUPLEX ULTRASOUND TECHNIQUE: Pearline Cables scale imaging, color Doppler and duplex ultrasound were performed of bilateral carotid and vertebral arteries in the neck. COMPARISON:  Head CT and brain MRI 04/22/2016. Carotid ultrasound 11/29/2012 FINDINGS: Criteria: Quantification of carotid stenosis is based on velocity parameters that correlate the residual internal carotid diameter with NASCET-based stenosis levels, using the diameter of the distal internal carotid lumen as the denominator for stenosis measurement. The following velocity measurements were obtained: RIGHT ICA:  62 cm/sec CCA:  59 cm/sec SYSTOLIC  ICA/CCA RATIO:  1.1 DIASTOLIC ICA/CCA RATIO:  0.8 ECA:  66 cm/sec LEFT ICA:  74 cm/sec CCA:  88 cm/sec SYSTOLIC ICA/CCA RATIO:  0.8 DIASTOLIC ICA/CCA RATIO:  1.4 ECA:  78 cm/sec RIGHT CAROTID ARTERY: Common carotid artery is patent with normal Doppler waveform. Moderate heterogeneous plaque at the right carotid bulb  and proximal internal carotid artery. Normal waveform within the internal carotid artery. RIGHT VERTEBRAL ARTERY:  Normal antegrade flow. LEFT CAROTID ARTERY: Patent common carotid artery with normal waveform. Very minimal plaque at the left carotid bifurcation and proximal internal carotid artery. Normal waveforms of the internal carotid artery. LEFT VERTEBRAL ARTERY:  Normal antegrade flow. IMPRESSION: Mild-to-moderate atherosclerotic plaque over the right carotid bulb and proximal internal carotid artery with minimal plaque over the left carotid bulb and proximal internal carotid artery. No hemodynamically significant stenosis. Normal antegrade flow within the vertebral arteries. Electronically Signed   By: Marin Olp M.D.   On: 04/22/2016 18:16    EKG:   Orders placed or performed during the hospital encounter of 04/22/16  . ED EKG  . 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  #1 new onset uncontrolled diabetes mellitus- the prior diagnosis. A1c is 13.1. -Started on Lantus. Diabetes coordinator consult. -Continue Lantus at bedtime. Also on sliding scale insulin at this time. Monitor sugars. -Ophthalmology appointment already scheduled as outpatient.  #2 unsteady gait-from neuropathy and elevated sugars. MRI negative for acute infarct. -Chronic changes noted. Carotid Dopplers with no hemodynamically significant stenosis. Echo is pending -Physical therapy consulted.  #3 hypokalemia and  hyponatremia-being replaced appropriately. -Check magnesium and phosphorus levels as well  #4 pancytopenia-secondary to liver cirrhosis. Chronic thrombocytopenia platelets are close to baseline. Hemoglobin is at 11. Continue to monitor and follow up with hematology as outpatient.  #5 hypertension-blood pressure is on the lower side, hold medications.  #6 acute renal failure-likely from prerenal causes and also ATN and diabetes. -Improving with fluids. Hold off for toxins.  #7 liver cirrhosis-continue extensive exam. On daily lactulose. Ammonia level is within normal limits.  #8 hypothyroidism-continue Synthroid  #9 DVT prophylaxis-Ted's and SCDs. Due to Thrombocytopenia   All the records are reviewed and case discussed with Care Management/Social Workerr. Management plans discussed with the patient, family and they are in agreement.  CODE STATUS: Full code  TOTAL TIME TAKING CARE OF THIS PATIENT: 41 minutes.   POSSIBLE D/C IN 1-2 DAYS, DEPENDING ON CLINICAL CONDITION.   Gladstone Lighter M.D on 04/23/2016 at 8:40 AM  Between 7am to 6pm - Pager - [phone]  After 6pm go to [email or web address] - password [email or web address] [facility]  Office  [phone]  CC: Primary care physician; Tracie Harrier, MD

## 2016-04-23 NOTE — Evaluation (Signed)
Clinical/Bedside Swallow Evaluation Patient Details  Name: Theresa [name] MRN: [id number] Date of Birth: [birth year]  Today's Date: 04/23/2016 Time: SLP Start Time (ACUTE ONLY): [id number] SLP Stop Time (ACUTE ONLY): 0930 SLP Time Calculation (min) (ACUTE ONLY): 25 min  Past Medical History:  Past Medical History:  Diagnosis Date  . Anemia   . CAD (coronary artery disease)   . Chronic low back pain   . Cirrhosis of liver not due to alcohol ([place])   . Colon polyps   . GERD (gastroesophageal reflux disease)   . Hyperlipidemia   . Hypertension   . IDA (iron deficiency anemia)   . 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 Sleep apnea   . SOB (shortness of breath)   . Thrombocytopenia ([place])    Past Surgical History:  Past Surgical History:  Procedure Laterality Date  . COLONOSCOPY  10/24/2013, 2002   hyperplastic polpys  . CORONARY ARTERY BYPASS GRAFT  09/1998  . ESOPHAGOGASTRODUODENOSCOPY ENDOSCOPY  05/24/2011  . hemorroid surgery    . TOTAL HIP ARTHROPLASTY Left 03/2003   HPI:      Assessment / Plan / Recommendation Clinical Impression  Pt presents with a minimal risk for aspiration. pt presents with a dry oral cavity likely due to elivated sugars of greater than 300.  pt stated oral cavity has been dry x 1 week and she has been using ice chips regularly. SLP educated to continue to use ice chips and moisten fods. SLP educated nursing to add pills to applesauce to increase safety with medication intake.  Pt stated that due to dry oral cavity eating dry foods has become difficult. SLP educated on how to moisten fods to increase safety without need for downgrading diet. pt and husband agreed to changed.  No change in diet recommended at this time. Pt speech and cognitive status appear WFL at this time and appears slurred speech has resolved.    Aspiration Risk  No limitations    Diet Recommendation Regular;Thin liquid   Liquid Administration via: Straw Medication  Administration: Whole meds with puree Supervision: Patient able to self feed Compensations: Slow rate;Small sips/bites;Follow solids with liquid Postural Changes: Seated upright at 90 degrees    Other  Recommendations Oral Care Recommendations: Patient independent with oral care   Follow up Recommendations None      Frequency and Duration            Prognosis Prognosis for Safe Diet Advancement: Good      Swallow Study   General Date of Onset: 04/22/16 Type of Study: Bedside Swallow Evaluation Diet Prior to this Study: Regular;Thin liquids Temperature Spikes Noted: No Respiratory Status: Room air History of Recent Intubation: No Behavior/Cognition: Alert;Cooperative;Pleasant mood Oral Cavity Assessment: Within Functional Limits Oral Care Completed by SLP: No Oral Cavity - Dentition: Adequate natural dentition Vision: Functional for self-feeding Self-Feeding Abilities: Able to feed self Patient Positioning: Upright in bed Baseline Vocal Quality: Normal Volitional Cough: Strong Volitional Swallow: Able to elicit    Oral/Motor/Sensory Function Overall Oral Motor/Sensory Function: Within functional limits   Ice Chips Ice chips: Within functional limits Presentation: Self Fed;Spoon   Thin Liquid Thin Liquid: Within functional limits Presentation: Cup;Self Fed;Straw    Nectar Thick Nectar Thick Liquid: Not tested   Honey Thick Honey Thick Liquid: Not tested   Puree Puree: Within functional limits Presentation: Self Fed;Spoon   Solid   GO   Solid: Within functional limits Presentation: Self Fed;Spoon Other Comments: pt does report dry mouth causing  difficulty with dry fods. SLP educated on how to moisten fods.        Joliyah Lippens Harris Sauber 04/23/2016,11:10 AM

## 2016-04-24 LAB — BASIC METABOLIC PANEL
Anion gap: 6 (ref 5–15)
BUN: 14 mg/dL (ref 6–20)
CO2: 27 mmol/L (ref 22–32)
CREATININE: 1.11 mg/dL — AB (ref 0.44–1.00)
Calcium: 8.7 mg/dL — ABNORMAL LOW (ref 8.9–10.3)
Chloride: 103 mmol/L (ref 101–111)
GFR calc Af Amer: 54 mL/min — ABNORMAL LOW (ref >60)
GFR, EST NON AFRICAN AMERICAN: 47 mL/min — AB (ref >60)
GLUCOSE: 188 mg/dL — AB (ref 65–99)
Potassium: 3.5 mmol/L (ref 3.5–5.1)
Sodium: 136 mmol/L (ref 135–145)

## 2016-04-24 LAB — GLUCOSE, CAPILLARY
GLUCOSE-CAPILLARY: 190 mg/dL — AB (ref 65–99)
GLUCOSE-CAPILLARY: 318 mg/dL — AB (ref 65–99)

## 2016-04-24 MED ORDER — LISINOPRIL 5 MG PO TABS: 5.0000 mg | ORAL_TABLET | Freq: Every day | ORAL | 2 refills | Status: DC

## 2016-04-24 MED ORDER — BLOOD GLUCOSE MONITOR KIT: PACK | 0 refills | Status: DC

## 2016-04-24 MED ORDER — INSULIN DETEMIR 100 UNIT/ML FLEXPEN: 20.0000 [IU] | Freq: Every day | SUBCUTANEOUS | 2 refills | Status: DC

## 2016-04-24 MED ORDER — SODIUM CHLORIDE 0.9% FLUSH
3.0000 mL | Freq: Two times a day (BID) | INTRAVENOUS | Status: DC
  Administered 2016-04-24: 3 mL via INTRAVENOUS

## 2016-04-24 NOTE — Progress Notes (Signed)
OT Cancellation Note  Patient Details Name: Theresa [name] MRN: [id number] DOB: 1940-09-28   Cancelled Treatment:    Reason Eval/Treat Not Completed: Medical issues which prohibited therapy.  Order recieved and chart reviewed.  Spoke to [name] who indicated she just received a call from telemetry that pt had an arrhythmia and needed vitals re-assessed before OT evaluation could occur.  Will attempt later today or tomorrow. Thank you for the referral.   Chrys Racer, OTR/L ascom [phone] 04/24/16, 10:02 AM

## 2016-04-24 NOTE — Discharge Summary (Signed)
[facility] [place] NAME: Theresa [name]    MR#:  [id number]  DATE OF BIRTH:  02-18-1941  DATE OF ADMISSION:  04/22/2016   ADMITTING PHYSICIAN: Loletha Grayer, MD  DATE OF DISCHARGE: 04/24/2016  PRIMARY CARE PHYSICIAN: Tracie Harrier, MD   ADMISSION DIAGNOSIS:   Shortness of breath [R06.02] Hyperglycemia [R73.9] CVA (cerebral vascular accident) ([place]) [I63.9] Cerebrovascular accident (CVA), unspecified mechanism ([place]) [I63.9]  DISCHARGE DIAGNOSIS:   Active Problems:   CVA (cerebral vascular accident) ([place])   SECONDARY DIAGNOSIS:   Past Medical History:  Diagnosis Date  . Anemia   . CAD (coronary artery disease)   . Chronic low back pain   . Cirrhosis of liver not due to alcohol ([place])   . Colon polyps   . GERD (gastroesophageal reflux disease)   . Hyperlipidemia   . Hypertension   . IDA (iron deficiency anemia)   . 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 Sleep apnea   . SOB (shortness of breath)   . Thrombocytopenia [facility])     HOSPITAL COURSE:   76 year old female with nonalcoholic liver cirrhosis, anemia, MGUS following at the cancer center, CAD, sleep apnea presents from physician office secondary to multiple medical problems.  #1 New onset uncontrolled diabetes mellitus- new diagnosis. A1c is 13.1. -Started on Lantus. Diabetes coordinator consult. -Continue Lantus at bedtime. Much improved sugars -Ophthalmology appointment already scheduled as outpatient.  #2 unsteady gait-from neuropathy and elevated sugars. MRI negative for acute infarct. -Chronic changes noted. Carotid Dopplers with no hemodynamically significant stenosis. Echo is normal -Physical therapy consulted. Recommended home health  #3 hypokalemia and hyponatremia-replaced appropriately. -normal magnesium and phosphorus levels as well  #4 pancytopenia-secondary to liver cirrhosis. Chronic thrombocytopenia platelets are  close to baseline. Hemoglobin is at 11. Continue to monitor and follow up with hematology as outpatient.  #5 hypertension-blood pressure is on the lower side, discontinue medications at discharge, however lisinopril low dose started for renal protection with her diabetes.  #6 acute renal failure-likely from prerenal causes and also ATN and diabetes. -Improved with fluids. Hold off nephrotoxins.  #7 liver cirrhosis-continue rifaximin. On daily lactulose. Ammonia level is within normal limits.  #8 hypothyroidism-continue Synthroid  Physical therapy recommended home health- discharge today   DISCHARGE CONDITIONS:   Guarded  CONSULTS OBTAINED:   None  DRUG ALLERGIES:   Allergies  Allergen Reactions  . Latex Other (See Comments)  . Morphine And Related Nausea And Vomiting  . Adhesive [Tape] Rash  . Other Rash    NEOPRENE, TAPE, BAND-AID TOUGH STRIPS   DISCHARGE MEDICATIONS:   Allergies as of 04/24/2016      Reactions   Latex Other (See Comments)   Morphine And Related Nausea And Vomiting   Adhesive [tape] Rash   Other Rash   NEOPRENE, TAPE, BAND-AID TOUGH STRIPS      Medication List    STOP taking these medications   amLODipine 5 MG tablet Commonly known as:  NORVASC   furosemide 40 MG tablet Commonly known as:  LASIX   hydrochlorothiazide 25 MG tablet Commonly known as:  HYDRODIURIL   metoprolol succinate 50 MG 24 hr tablet Commonly known as:  TOPROL-XL     TAKE these medications   aspirin EC 81 MG tablet Take 81 mg by mouth daily.   blood glucose meter kit and supplies Kit Dispense based on patient and insurance preference. Use up to four times daily as directed. (FOR ICD-9 250.00, 250.01).   calcium-vitamin  D 500-200 MG-UNIT tablet Take 1 tablet by mouth daily.   folic acid 366 MCG tablet Commonly known as:  FOLVITE Take 400 mcg by mouth daily.   gabapentin 100 MG capsule Commonly known as:  NEURONTIN Take 200 mg by mouth at bedtime.     insulin detemir 100 unit/ml Soln Commonly known as:  LEVEMIR Inject 0.2 mLs (20 Units total) into the skin at bedtime.   lactulose (encephalopathy) 10 GM/15ML Soln Commonly known as:  CHRONULAC Take 10 g by mouth daily as needed.   levothyroxine 50 MCG tablet Commonly known as:  SYNTHROID, LEVOTHROID TAKE 1 TABLET DAILY   lisinopril 5 MG tablet Commonly known as:  PRINIVIL,ZESTRIL Take 1 tablet (5 mg total) by mouth daily. What changed:  See the new instructions.   meclizine 12.5 MG tablet Commonly known as:  ANTIVERT Take 12.5 mg by mouth as needed. Reported on 10/14/2015   Melatonin 3 MG Tabs Take 3 mg by mouth as needed.   MULTI-VITAMINS Tabs Take by mouth.   omeprazole 20 MG capsule Commonly known as:  PRILOSEC TAKE 1 CAPSULE DAILY   simvastatin 20 MG tablet Commonly known as:  ZOCOR TAKE 1 TABLET EVERY EVENING FOR CHOLESTEROL   vitamin C 500 MG tablet Commonly known as:  ASCORBIC ACID Take 500 mg by mouth as needed.   XIFAXAN 550 MG Tabs tablet Generic drug:  rifaximin Take 550 mg by mouth 2 (two) times daily.        DISCHARGE INSTRUCTIONS:   1. PCP f/u in 1-2 weeks 2. Dietitian consult as outpatient  DIET:   Cardiac diet and Diabetic diet  ACTIVITY:   Activity as tolerated  OXYGEN:   Home Oxygen: No.  Oxygen Delivery: room air  DISCHARGE LOCATION:   home   If you experience worsening of your admission symptoms, develop shortness of breath, life threatening emergency, suicidal or homicidal thoughts you must seek medical attention immediately by calling 911 or calling your MD immediately  if symptoms less severe.  You Must read complete instructions/literature along with all the possible adverse reactions/side effects for all the Medicines you take and that have been prescribed to you. Take any new Medicines after you have completely understood and accpet all the possible adverse reactions/side effects.   Please note  You were cared for  by a hospitalist during your hospital stay. If you have any questions about your discharge medications or the care you received while you were in the hospital after you are discharged, you can call the unit and asked to speak with the hospitalist on call if the hospitalist that took care of you is not available. Once you are discharged, your primary care physician will handle any further medical issues. Please note that NO REFILLS for any discharge medications will be authorized once you are discharged, as it is imperative that you return to your primary care physician (or establish a relationship with a primary care physician if you do not have one) for your aftercare needs so that they can reassess your need for medications and monitor your lab values.    On the day of Discharge:  VITAL SIGNS:   Blood pressure (!) 93/57, pulse 74, temperature 97.7 F (36.5 C), temperature source Oral, resp. rate 20, height 5' (1.524 m), weight 79.9 kg (176 lb 2 oz), SpO2 100 %.  PHYSICAL EXAMINATION:    GENERAL:  76 y.o.-year-old patient lying in the bed with no acute distress.  EYES: Pupils equal, round, reactive to light  and accommodation. No scleral icterus. Extraocular muscles intact.  HEENT: Head atraumatic, normocephalic. Oropharynx and nasopharynx clear.  NECK:  Supple, no jugular venous distention. No thyroid enlargement, no tenderness.  LUNGS: Normal breath sounds bilaterally, no wheezing, rales,rhonchi or crepitation. No use of accessory muscles of respiration. Decreased bibasilar breath sounds CARDIOVASCULAR: S1, S2 normal. No murmurs, rubs, or gallops.  ABDOMEN: Soft, nontender, nondistended. Bowel sounds present. No organomegaly or mass.  EXTREMITIES: No pedal edema, cyanosis, or clubbing.  NEUROLOGIC: Cranial nerves II through XII are intact. Muscle strength 5/5 in all extremities. Sensation intact. Gait not checked.  PSYCHIATRIC: The patient is alert and oriented x 3.  SKIN: No obvious rash,  lesion, or ulcer.  DATA REVIEW:   CBC  Recent Labs Lab 04/23/16 0607  WBC 2.9*  HGB 11.0*  HCT 30.9*  PLT 52*    Chemistries   Recent Labs Lab 04/22/16 1318 04/23/16 0607 04/24/16 0523  NA 126* 133* 136  K 3.8 2.8* 3.5  CL 85* 96* 103  CO2 33* 31 27  GLUCOSE 690* 201* 188*  BUN 25* 20 14  CREATININE 1.68* 1.41* 1.11*  CALCIUM 9.1 8.6* 8.7*  MG  --  1.8  --   AST 45*  --   --   ALT 35  --   --   ALKPHOS 115  --   --   BILITOT 1.9*  --   --      Microbiology Results  Results for orders placed or performed in visit on 01/29/14  Urine culture     Status: None   Collection Time: 01/30/14 10:57 AM  Result Value Ref Range Status   Micro Text Report   Final       SOURCE: CLEAN CATCH    ORGANISM 1                >100,000 CFU/ML KLEBSIELLA PNEUMONIAE SSP PNEUMONI   ANTIBIOTIC                    ORG#1     AMPICILLIN                    R         CEFAZOLIN                     S         CEFOXITIN                     S         CEFTRIAXONE                   S         CIPROFLOXACIN                 S         GENTAMICIN                    S         IMIPENEM                      S         LEVOFLOXACIN                  S         NITROFURANTOIN                S  TRIMETHOPRIM/SULFAMETHOXAZOLE S             RADIOLOGY:  No results found.   Management plans discussed with the patient, family and they are in agreement.  CODE STATUS:     Code Status Orders        Start     Ordered   04/22/16 1543  Full code  Continuous     04/22/16 1543    Code Status History    Date Active Date Inactive Code Status Order ID Comments User Context   This patient has a current code status but no historical code status.      TOTAL TIME TAKING CARE OF THIS PATIENT: 38 minutes.    Gladstone Lighter M.D on 04/24/2016 at 1:09 PM  Between 7am to 6pm - Pager - [phone]  After 6pm go to [email or web address] - Proofreader  [facility]    Office  [phone]  CC: Primary care physician; Tracie Harrier, MD   Note: This dictation was prepared with Dragon dictation along with smaller phrase technology. Any transcriptional errors that result from this process are unintentional.

## 2016-04-24 NOTE — Progress Notes (Signed)
Physical Therapy Treatment Patient Details Name: Theresa [name] MRN: [id number] DOB: May 27, 1940 Today's Date: 04/24/2016    History of Present Illness 76 yo female with onset of R side weakness was admitted for testing, AKI and has chronic lacunar and cerebellar infarcts, with PMHx:  monoclonal gammopathy, cirrhosis, SOB, CAD    PT Comments    Pt ready for session today hoping to go home.  Bed mobility without assist.  She was able to stand without assist and ambulate 40' with hand held assist then continued to ambulate around unit with only occasional handrail assist and 2 standing rest breaks due to fatigue.  No dizziness or chest pain reported.  Discussed at length with pt and husband.  Anticipate pt will do well ambulating in her home and furniture "surfing" as she remains resistant to walker use.  She would benefit from walker use for outings or longer ambulation distances in open areas for balance and energy conservation.  She continues to resist stating she has her husband to help.  Education provided.    Follow Up Recommendations  Home health PT;Supervision for mobility/OOB     Equipment Recommendations  Rolling walker with 5" wheels    Recommendations for Other Services       Precautions / Restrictions Precautions Precautions: Fall Restrictions Weight Bearing Restrictions: No    Mobility  Bed Mobility Overal bed mobility: Independent                Transfers Overall transfer level: Modified independent Equipment used: None;1 person hand held assist   Sit to Stand: Modified independent (Device/Increase time) Stand pivot transfers: Modified independent (Device/Increase time)       General transfer comment: no dizziness noted today  Ambulation/Gait   Ambulation Distance (Feet): 200 Feet Assistive device: 1 person hand held assist;None     Gait velocity interpretation: <1.8 ft/sec, indicative of risk for recurrent falls General Gait Details: initially  HHA but after 40' was able to let go and ambualte without assist.  Unsteady at times but able to recover without assist.  Standing rest breaks x 2 due to fatigue.  Occasionally reaching for handrails for balance.   Stairs            Wheelchair Mobility    Modified Rankin (Stroke Patients Only)       Balance                                    Cognition Arousal/Alertness: Awake/alert Behavior During Therapy: WFL for tasks assessed/performed Overall Cognitive Status: Within Functional Limits for tasks assessed                      Exercises      General Comments        Pertinent Vitals/Pain Pain Assessment: No/denies pain    Home Living                      Prior Function            PT Goals (current goals can now be found in the care plan section) Progress towards PT goals: Progressing toward goals    Frequency    7X/week      PT Plan      Co-evaluation             End of Session Equipment Utilized During Treatment: Gait belt Activity Tolerance:  Patient tolerated treatment well;Patient limited by fatigue Patient left: in bed;with family/visitor present;with call bell/phone within reach     Time: [id number] PT Time Calculation (min) (ACUTE ONLY): 10 min  Charges:  $Gait Training: 8-22 mins                    G Codes:      Theresa [name] 01-May-2016, 10:13 AM

## 2016-04-24 NOTE — Progress Notes (Signed)
Discharge home to self care accompanied by her husband. Pt transported in transport chair to private vehicle.

## 2016-04-24 NOTE — Care Management Note (Signed)
Case Management Note  Patient Details  Name: Theresa [name] MRN: [id number] Date of Birth: 1940/06/22  Subjective/Objective:     Per the [facility] Formulary on the Internet, Lantus is covered by [facility] for patients who elected for Tier 3 [facility] insurance only. Tier 3 is [employer]'s most expensive coverage option. Will probably have to run a prescription for Lantus through [facility] [place] to find out definitively if Lantus is a covered medication.                Action/Plan:   Expected Discharge Date:                  Expected Discharge Plan:     In-House Referral:     Discharge planning Services     Post Acute Care Choice:    Choice offered to:     DME Arranged:    DME Agency:     HH Arranged:    HH Agency:     Status of Service:     If discussed at [employer] of Stay Meetings, dates discussed:    Additional Comments:  Ethanjames Fontenot A, RN 04/24/2016, 9:14 AM

## 2016-04-24 NOTE — Progress Notes (Signed)
Telemetry message received at 1000 that pt had run of SVT, HR 160's that occurred at 0937. Pt was going to BR had incont stool with trying to change clothes. Denies co's/chest pain/ SOB/palpitations. VS's taken. Up in room/pt has ambulated in hall with PT also approximately at that time.  Continue to observe/assess.

## 2016-04-24 NOTE — Care Management Note (Signed)
Case Management Note  Patient Details  Name: YADIRAH MELTZER MRN: [id number] Date of Birth: Oct 22, 1940  Subjective/Objective:        Discussed discharge planning with Mr and Mrs Wagers. They chose [facility] to be their [facility] provider. A referral was called to [facility] for a HH-PT. Mrs Donna already has a RW at home.             Action/Plan:   Expected Discharge Date:  04/24/16               Expected Discharge Plan:     In-House Referral:     Discharge planning Services     Post Acute Care Choice:    Choice offered to:     DME Arranged:    DME Agency:     HH Arranged:    HH Agency:     Status of Service:     If discussed at [employer] of [employer], dates discussed:    Additional Comments:  Tommie Bohlken A, RN 04/24/2016, 12:34 PM

## 2016-04-24 NOTE — Progress Notes (Signed)
Pt has had diarrhea x 2 related to lactulose. No further arrhythmias. Denies co's. States she feels much better. Pt, husband and son instructed with insulin pen starter kit with copy of written instructions on hand for pt/family. Pt with assistance of husband and son was able to demonstrated pen insulin injection- pt had difficulty dialing correct dose but verified with son and husband for verfication. Pt was able to inject self appropriately this morning. Has difficulty remembering to remove needle covers, but did with cueing from family. Prescriptions given for insulin and glucometer. Oral and written modified carb diet, low sodium instructions done. Oral and written AVS discharge instructions given - with lovenox ed deleted with stated understanding/repeated back by pt, husband and son. Discharge planner has spoken to pt. Pt preparing for discharge home with husband in private vehicle.

## 2016-04-27 ENCOUNTER — Other Ambulatory Visit
Admission: RE | Admit: 2016-04-27 | Discharge: 2016-04-27 | Disposition: A | Discharge status: Home / Self Care | Payer: Medicare Other | Source: Ambulatory Visit | Attending: Internal Medicine | Admitting: Internal Medicine

## 2016-04-27 DIAGNOSIS — K7460 Unspecified cirrhosis of liver: Secondary | ICD-10-CM | POA: Insufficient documentation

## 2016-04-27 DIAGNOSIS — N183 Chronic kidney disease, stage 3 (moderate): Secondary | ICD-10-CM | POA: Insufficient documentation

## 2016-04-27 DIAGNOSIS — Z794 Long term (current) use of insulin: Secondary | ICD-10-CM | POA: Diagnosis present

## 2016-04-27 DIAGNOSIS — I25810 Atherosclerosis of coronary artery bypass graft(s) without angina pectoris: Secondary | ICD-10-CM | POA: Insufficient documentation

## 2016-04-27 DIAGNOSIS — Z09 Encounter for follow-up examination after completed treatment for conditions other than malignant neoplasm: Secondary | ICD-10-CM | POA: Diagnosis present

## 2016-04-27 DIAGNOSIS — E1122 Type 2 diabetes mellitus with diabetic chronic kidney disease: Secondary | ICD-10-CM | POA: Insufficient documentation

## 2016-04-27 LAB — AMMONIA: AMMONIA: 19 umol/L (ref 9–35)

## 2016-04-29 ENCOUNTER — Other Ambulatory Visit: Payer: Self-pay | Admitting: Internal Medicine

## 2016-04-29 DIAGNOSIS — R748 Abnormal levels of other serum enzymes: Secondary | ICD-10-CM

## 2016-05-09 ENCOUNTER — Ambulatory Visit: Admit: 2016-05-09 | Payer: Medicare Other | Admitting: Unknown Physician Specialty

## 2016-05-09 SURGERY — EGD (ESOPHAGOGASTRODUODENOSCOPY)
Anesthesia: General

## 2016-05-12 ENCOUNTER — Ambulatory Visit
Admission: RE | Admit: 2016-05-12 | Discharge: 2016-05-12 | Disposition: A | Discharge status: Home / Self Care | Payer: Medicare Other | Source: Ambulatory Visit | Attending: Internal Medicine | Admitting: Internal Medicine

## 2016-05-12 DIAGNOSIS — K8020 Calculus of gallbladder without cholecystitis without obstruction: Secondary | ICD-10-CM | POA: Insufficient documentation

## 2016-05-12 DIAGNOSIS — I700 Atherosclerosis of aorta: Secondary | ICD-10-CM | POA: Insufficient documentation

## 2016-05-12 DIAGNOSIS — R161 Splenomegaly, not elsewhere classified: Secondary | ICD-10-CM | POA: Insufficient documentation

## 2016-05-12 DIAGNOSIS — K5730 Diverticulosis of large intestine without perforation or abscess without bleeding: Secondary | ICD-10-CM | POA: Insufficient documentation

## 2016-05-12 DIAGNOSIS — R748 Abnormal levels of other serum enzymes: Secondary | ICD-10-CM | POA: Insufficient documentation

## 2016-05-12 DIAGNOSIS — I517 Cardiomegaly: Secondary | ICD-10-CM | POA: Diagnosis not present

## 2016-05-12 DIAGNOSIS — R188 Other ascites: Secondary | ICD-10-CM | POA: Diagnosis not present

## 2016-05-12 DIAGNOSIS — K7460 Unspecified cirrhosis of liver: Secondary | ICD-10-CM | POA: Insufficient documentation

## 2016-05-12 HISTORY — DX: Type 2 diabetes mellitus without complications: E11.9

## 2016-05-12 HISTORY — DX: Heart failure, unspecified: I50.9

## 2016-05-12 HISTORY — DX: Unspecified asthma, uncomplicated: J45.909

## 2016-05-12 MED ORDER — IOPAMIDOL (ISOVUE-300) INJECTION 61%
100.0000 mL | Freq: Once | INTRAVENOUS | Status: AC | PRN
  Administered 2016-05-12: 100 mL via INTRAVENOUS

## 2016-08-25 ENCOUNTER — Encounter: Payer: Self-pay | Admitting: *Deleted

## 2016-08-26 ENCOUNTER — Ambulatory Visit: Payer: Medicare Other | Admitting: Anesthesiology

## 2016-08-26 ENCOUNTER — Encounter
Admission: RE | Disposition: A | Discharge status: Home / Self Care | Payer: Self-pay | Source: Ambulatory Visit | Attending: Unknown Physician Specialty

## 2016-08-26 ENCOUNTER — Ambulatory Visit
Admission: RE | Admit: 2016-08-26 | Discharge: 2016-08-26 | Disposition: A | Discharge status: Home / Self Care | Payer: Medicare Other | Source: Ambulatory Visit | Attending: Unknown Physician Specialty | Admitting: Unknown Physician Specialty

## 2016-08-26 ENCOUNTER — Encounter: Payer: Self-pay | Admitting: *Deleted

## 2016-08-26 DIAGNOSIS — Z951 Presence of aortocoronary bypass graft: Secondary | ICD-10-CM | POA: Diagnosis not present

## 2016-08-26 DIAGNOSIS — I509 Heart failure, unspecified: Secondary | ICD-10-CM | POA: Diagnosis not present

## 2016-08-26 DIAGNOSIS — Z888 Allergy status to other drugs, medicaments and biological substances status: Secondary | ICD-10-CM | POA: Insufficient documentation

## 2016-08-26 DIAGNOSIS — D509 Iron deficiency anemia, unspecified: Secondary | ICD-10-CM | POA: Diagnosis not present

## 2016-08-26 DIAGNOSIS — K766 Portal hypertension: Secondary | ICD-10-CM | POA: Diagnosis not present

## 2016-08-26 DIAGNOSIS — E119 Type 2 diabetes mellitus without complications: Secondary | ICD-10-CM | POA: Diagnosis not present

## 2016-08-26 DIAGNOSIS — K3189 Other diseases of stomach and duodenum: Secondary | ICD-10-CM | POA: Insufficient documentation

## 2016-08-26 DIAGNOSIS — Z96642 Presence of left artificial hip joint: Secondary | ICD-10-CM | POA: Insufficient documentation

## 2016-08-26 DIAGNOSIS — Z91040 Latex allergy status: Secondary | ICD-10-CM | POA: Diagnosis not present

## 2016-08-26 DIAGNOSIS — Z79899 Other long term (current) drug therapy: Secondary | ICD-10-CM | POA: Insufficient documentation

## 2016-08-26 DIAGNOSIS — K7460 Unspecified cirrhosis of liver: Secondary | ICD-10-CM | POA: Diagnosis not present

## 2016-08-26 DIAGNOSIS — Z8673 Personal history of transient ischemic attack (TIA), and cerebral infarction without residual deficits: Secondary | ICD-10-CM | POA: Insufficient documentation

## 2016-08-26 DIAGNOSIS — I110 Hypertensive heart disease with heart failure: Secondary | ICD-10-CM | POA: Diagnosis not present

## 2016-08-26 DIAGNOSIS — D472 Monoclonal gammopathy: Secondary | ICD-10-CM | POA: Insufficient documentation

## 2016-08-26 DIAGNOSIS — E785 Hyperlipidemia, unspecified: Secondary | ICD-10-CM | POA: Insufficient documentation

## 2016-08-26 DIAGNOSIS — I2510 Atherosclerotic heart disease of native coronary artery without angina pectoris: Secondary | ICD-10-CM | POA: Diagnosis not present

## 2016-08-26 DIAGNOSIS — Z86010 Personal history of colonic polyps: Secondary | ICD-10-CM | POA: Insufficient documentation

## 2016-08-26 DIAGNOSIS — Z8249 Family history of ischemic heart disease and other diseases of the circulatory system: Secondary | ICD-10-CM | POA: Insufficient documentation

## 2016-08-26 DIAGNOSIS — Z794 Long term (current) use of insulin: Secondary | ICD-10-CM | POA: Insufficient documentation

## 2016-08-26 DIAGNOSIS — Z7982 Long term (current) use of aspirin: Secondary | ICD-10-CM | POA: Diagnosis not present

## 2016-08-26 DIAGNOSIS — J45909 Unspecified asthma, uncomplicated: Secondary | ICD-10-CM | POA: Insufficient documentation

## 2016-08-26 DIAGNOSIS — G4730 Sleep apnea, unspecified: Secondary | ICD-10-CM | POA: Diagnosis not present

## 2016-08-26 DIAGNOSIS — I8510 Secondary esophageal varices without bleeding: Secondary | ICD-10-CM | POA: Insufficient documentation

## 2016-08-26 DIAGNOSIS — Z885 Allergy status to narcotic agent status: Secondary | ICD-10-CM | POA: Insufficient documentation

## 2016-08-26 DIAGNOSIS — Z96653 Presence of artificial knee joint, bilateral: Secondary | ICD-10-CM | POA: Insufficient documentation

## 2016-08-26 DIAGNOSIS — K219 Gastro-esophageal reflux disease without esophagitis: Secondary | ICD-10-CM | POA: Diagnosis not present

## 2016-08-26 HISTORY — PX: ESOPHAGOGASTRODUODENOSCOPY (EGD) WITH PROPOFOL: SHX5813

## 2016-08-26 LAB — GLUCOSE, CAPILLARY: Glucose-Capillary: 82 mg/dL (ref 65–99)

## 2016-08-26 SURGERY — ESOPHAGOGASTRODUODENOSCOPY (EGD) WITH PROPOFOL
Anesthesia: General

## 2016-08-26 MED ORDER — LIDOCAINE 2% (20 MG/ML) 5 ML SYRINGE
INTRAMUSCULAR | Status: DC | PRN
  Administered 2016-08-26: 100 mg via INTRAVENOUS

## 2016-08-26 MED ORDER — ONDANSETRON HCL 4 MG/2ML IJ SOLN: 4.0000 mg | Freq: Once | INTRAMUSCULAR | Status: DC | PRN

## 2016-08-26 MED ORDER — PROPOFOL 500 MG/50ML IV EMUL
INTRAVENOUS | Status: DC | PRN
  Administered 2016-08-26: 200 ug/kg/min via INTRAVENOUS

## 2016-08-26 MED ORDER — BUTAMBEN-TETRACAINE-BENZOCAINE 2-2-14 % EX AERO
1.0000 | INHALATION_SPRAY | Freq: Once | CUTANEOUS | Status: AC
  Administered 2016-08-26: 1 via TOPICAL

## 2016-08-26 MED ORDER — PROPOFOL 500 MG/50ML IV EMUL
INTRAVENOUS | Status: AC
  Filled 2016-08-26: qty 50

## 2016-08-26 MED ORDER — GLYCOPYRROLATE 0.2 MG/ML IJ SOLN
INTRAMUSCULAR | Status: DC | PRN
  Administered 2016-08-26: 0.2 mg via INTRAVENOUS

## 2016-08-26 MED ORDER — PIPERACILLIN-TAZOBACTAM 3.375 G IVPB 30 MIN
3.3750 g | Freq: Once | INTRAVENOUS | Status: AC
  Administered 2016-08-26: 3.375 g via INTRAVENOUS

## 2016-08-26 MED ORDER — FENTANYL CITRATE (PF) 100 MCG/2ML IJ SOLN: 25.0000 ug | INTRAMUSCULAR | Status: DC | PRN

## 2016-08-26 MED ORDER — SODIUM CHLORIDE 0.9 % IV SOLN: INTRAVENOUS | Status: DC

## 2016-08-26 MED ORDER — PROPOFOL 10 MG/ML IV BOLUS
INTRAVENOUS | Status: DC | PRN
  Administered 2016-08-26: 50 mg via INTRAVENOUS
  Administered 2016-08-26: 30 mg via INTRAVENOUS

## 2016-08-26 MED ORDER — SODIUM CHLORIDE 0.9 % IV SOLN
INTRAVENOUS | Status: DC
  Administered 2016-08-26: 1000 mL via INTRAVENOUS

## 2016-08-26 MED ORDER — BUTAMBEN-TETRACAINE-BENZOCAINE 2-2-14 % EX AERO
INHALATION_SPRAY | CUTANEOUS | Status: AC
  Filled 2016-08-26: qty 20

## 2016-08-26 NOTE — Op Note (Signed)
[facility] Gastroenterology Patient Name: Theresa [name] Procedure Date: 08/26/2016 8:59 AM MRN: [id number] Account #: [id number] Date of Birth: 1940-12-02 Admit Type: Outpatient Age: 76 Room: [facility] ENDO ROOM 1 Gender: Female Note Status: Finalized Procedure:            Upper GI endoscopy Indications:          Follow-up of esophageal varices Providers:            Manya Silvas, MD Referring MD:         Tracie Harrier, MD (Referring MD) Medicines:            Propofol per Anesthesia Complications:        No immediate complications. Procedure:            Pre-Anesthesia Assessment:                       - After reviewing the risks and benefits, the patient                        was deemed in satisfactory condition to undergo the                        procedure.                       After obtaining informed consent, the endoscope was                        passed under direct vision. Throughout the procedure,                        the patient's blood pressure, pulse, and oxygen                        saturations were monitored continuously. The Endoscope                        was introduced through the mouth, and advanced to the                        second part of duodenum. The upper GI endoscopy was                        accomplished without difficulty. The patient tolerated                        the procedure well. Findings:      Grade II varices were found in the lower third of the esophagus.      Moderate to Severe portal hypertensive gastropathy was found in the       gastric body and in the gastric antrum. Multiple erythematous spots       scattered over the stomach, mostly in proximal stomach.      The examined duodenum was normal. Impression:           - Grade II esophageal varices.                       - Portal hypertensive gastropathy.                       - Normal  examined duodenum.                       - No specimens  collected. Recommendation:       - The findings and recommendations were discussed with                        the patient's family. Manya Silvas, MD 08/26/2016 9:24:07 AM This report has been signed electronically. Number of Addenda: 0 Note Initiated On: 08/26/2016 8:59 AM      [facility]

## 2016-08-26 NOTE — H&P (Signed)
Primary Care Physician:  Tracie Harrier, MD Primary Gastroenterologist:  Dr. Vira Agar  Pre-Procedure History & Physical: HPI:  Theresa [name] is a 76 y.o. female is here for an endoscopy.   Past Medical History:  Diagnosis Date  . Anemia   . Asthma   . CAD (coronary artery disease)   . CHF (congestive heart failure) ([place])   . Chronic low back pain   . Cirrhosis of liver not due to alcohol ([place])   . Colon polyps   . Diabetes mellitus without complication ([place])   . GERD (gastroesophageal reflux disease)   . Hyperlipidemia   . Hypertension   . IDA (iron deficiency anemia)   . 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 Sleep apnea   . SOB (shortness of breath)   . Thrombocytopenia ([place])     Past Surgical History:  Procedure Laterality Date  . COLONOSCOPY  10/24/2013, 2002   hyperplastic polpys  . CORONARY ARTERY BYPASS GRAFT  09/1998  . ESOPHAGOGASTRODUODENOSCOPY ENDOSCOPY  05/24/2011  . hemorroid surgery    . JOINT REPLACEMENT     bil.knees  . TOTAL HIP ARTHROPLASTY Left 03/2003    Prior to Admission medications   Medication Sig Start Date End Date Taking? Authorizing Provider  aspirin EC 81 MG tablet Take 81 mg by mouth daily.   Yes [provider]  blood glucose meter kit and supplies KIT Dispense based on patient and insurance preference. Use up to four times daily as directed. (FOR ICD-9 250.00, 250.01). 04/24/16  Yes Gladstone Lighter, MD  Calcium Carbonate-Vitamin D (CALCIUM-VITAMIN D) 500-200 MG-UNIT tablet Take 1 tablet by mouth daily.   Yes [provider]  folic acid (FOLVITE) 062 MCG tablet Take 400 mcg by mouth daily.    Yes [provider]  gabapentin (NEURONTIN) 100 MG capsule Take 200 mg by mouth at bedtime.  01/26/15  Yes [provider]  insulin detemir (LEVEMIR) 100 unit/ml SOLN Inject 0.2 mLs (20 Units total) into the skin at bedtime. 04/24/16  Yes Gladstone Lighter, MD  levothyroxine (SYNTHROID,  LEVOTHROID) 50 MCG tablet TAKE 1 TABLET DAILY 03/31/15  Yes [provider]  lisinopril (PRINIVIL,ZESTRIL) 5 MG tablet Take 1 tablet (5 mg total) by mouth daily. 04/24/16  Yes Gladstone Lighter, MD  meclizine (ANTIVERT) 12.5 MG tablet Take 12.5 mg by mouth as needed. Reported on 10/14/2015   Yes [provider]  Melatonin 3 MG TABS Take 3 mg by mouth as needed.   Yes [provider]  Multiple Vitamin (MULTI-VITAMINS) TABS Take by mouth.   Yes [provider]  omeprazole (PRILOSEC) 20 MG capsule TAKE 1 CAPSULE DAILY 03/24/15  Yes [provider]  rifaximin (XIFAXAN) 550 MG TABS tablet Take 550 mg by mouth 2 (two) times daily.  01/20/15  Yes [provider]  simvastatin (ZOCOR) 20 MG tablet TAKE 1 TABLET EVERY EVENING FOR CHOLESTEROL 03/24/15  Yes [provider]  vitamin C (ASCORBIC ACID) 500 MG tablet Take 500 mg by mouth as needed.    Yes [provider]  zinc gluconate 50 MG tablet Take 50 mg by mouth daily.   Yes [provider]  lactulose, encephalopathy, (CHRONULAC) 10 GM/15ML SOLN Take 10 g by mouth daily as needed.  09/27/15   [provider]    Allergies as of 07/25/2016 - Review Complete 05/12/2016  Allergen Reaction Noted  . Latex Other (See Comments) 03/31/2015  . Morphine and related Nausea And Vomiting 11/14/2014  . Adhesive [  tape] Rash 11/14/2014  . Other Rash 03/31/2015    Family History  Problem Relation Age of Onset  . Heart disease Unknown   . Hypertension Unknown   . Anemia Unknown   . Colon cancer Unknown   . CVA Mother   . Dementia Mother   . CAD Father     Social History   Social History  . Marital status: Married    Spouse name: N/A  . Number of children: N/A  . Years of education: N/A   Occupational History  . Not on file.   Social History Main Topics  . Smoking status: Never Smoker  . Smokeless tobacco: Never Used  . Alcohol use No  . Drug use: No  . Sexual  activity: Not on file   Other Topics Concern  . Not on file   Social History Narrative  . No narrative on file    Review of Systems: See HPI, otherwise negative ROS  Physical Exam: BP (!) 147/57   Pulse 65   Temp 97.1 F (36.2 C) (Tympanic)   Resp 20   Ht 5' (1.524 m)   Wt 78 kg (172 lb)   BMI 33.59 kg/m  General:   Alert,  pleasant and cooperative in NAD Head:  Normocephalic and atraumatic. Neck:  Supple; no masses or thyromegaly. Lungs:  Clear throughout to auscultation.    Heart:  Regular rate and rhythm. Abdomen:  Soft, nontender and nondistended. Normal bowel sounds, without guarding, and without rebound.   Neurologic:  Alert and  oriented x4;  grossly normal neurologically.  Impression/Plan: Theresa [name] is here for an endoscopy to be performed for follow up esophageal varices  Risks, benefits, limitations, and alternatives regarding  endoscopy have been reviewed with the patient.  Questions have been answered.  All parties agreeable.   Gaylyn Cheers, MD  08/26/2016, 9:03 AM

## 2016-08-26 NOTE — Anesthesia Postprocedure Evaluation (Signed)
Anesthesia Post Note  Patient: Theresa [name]  Procedure(s) Performed: Procedure(s) (LRB): ESOPHAGOGASTRODUODENOSCOPY (EGD) WITH PROPOFOL (N/A)  Patient location during evaluation: PACU Anesthesia Type: General Level of consciousness: awake and alert and oriented Pain management: pain level controlled Vital Signs Assessment: post-procedure vital signs reviewed and stable Respiratory status: spontaneous breathing Cardiovascular status: blood pressure returned to baseline Anesthetic complications: no     Last Vitals:  Vitals:   08/26/16 1002 08/26/16 1012  BP: 122/69   Pulse: 65 63  Resp: 18 (!) 22  Temp:      Last Pain:  Vitals:   08/26/16 0924  TempSrc: Tympanic                 Inetta Dicke

## 2016-08-26 NOTE — Transfer of Care (Signed)
Immediate Anesthesia Transfer of Care Note  Patient: Theresa [name]  Procedure(s) Performed: Procedure(s): ESOPHAGOGASTRODUODENOSCOPY (EGD) WITH PROPOFOL (N/A)  Patient Location: Endoscopy Unit  Anesthesia Type:General  Level of Consciousness: sedated  Airway & Oxygen Therapy: Patient connected to nasal cannula oxygen  Post-op Assessment: Post -op Vital signs reviewed and stable  Post vital signs: stable  Last Vitals:  Vitals:   08/26/16 0757 08/26/16 0924  BP: (!) 147/57 (!) 109/50  Pulse: 65 72  Resp: 20 17  Temp: 36.2 C 36.2 C    Last Pain:  Vitals:   08/26/16 0924  TempSrc: Tympanic         Complications: No apparent anesthesia complications

## 2016-08-26 NOTE — Anesthesia Post-op Follow-up Note (Cosign Needed)
Anesthesia QCDR form completed.        

## 2016-08-26 NOTE — Anesthesia Preprocedure Evaluation (Addendum)
Anesthesia Evaluation  Patient identified by MRN, date of birth, ID band Patient awake    Reviewed: Allergy & Precautions, NPO status , Patient's Chart, lab work & pertinent test results  Airway Mallampati: III  TM Distance: >3 FB     Dental  (+) Caps   Pulmonary asthma , sleep apnea ,    Pulmonary exam normal        Cardiovascular hypertension, Pt. on medications + CAD and +CHF  Normal cardiovascular exam     Neuro/Psych CVA, Residual Symptoms negative psych ROS   GI/Hepatic Neg liver ROS, GERD  Medicated,  Endo/Other  diabetes, Well Controlled, Type 2  Renal/GU negative Renal ROS  negative genitourinary   Musculoskeletal negative musculoskeletal ROS (+)   Abdominal Normal abdominal exam  (+)   Peds negative pediatric ROS (+)  Hematology  (+) anemia ,   Anesthesia Other Findings   Reproductive/Obstetrics                             Anesthesia Physical Anesthesia Plan  ASA: III  Anesthesia Plan: General   Post-op Pain Management:    Induction: Intravenous  Airway Management Planned: Nasal Cannula  Additional Equipment:   Intra-op Plan:   Post-operative Plan:   Informed Consent: I have reviewed the patients History and Physical, chart, labs and discussed the procedure including the risks, benefits and alternatives for the proposed anesthesia with the patient or authorized representative who has indicated his/her understanding and acceptance.   Dental advisory given  Plan Discussed with: CRNA and Surgeon  Anesthesia Plan Comments:         Anesthesia Quick Evaluation

## 2016-08-29 ENCOUNTER — Encounter: Payer: Self-pay | Admitting: Unknown Physician Specialty

## 2016-09-05 ENCOUNTER — Ambulatory Visit: Payer: Medicare Other | Admitting: *Deleted

## 2016-09-09 ENCOUNTER — Encounter: Payer: Medicare Other | Attending: Internal Medicine | Admitting: *Deleted

## 2016-09-09 ENCOUNTER — Encounter: Payer: Self-pay | Admitting: *Deleted

## 2016-09-09 VITALS — BP 140/86 | Ht 60.0 in | Wt 174.0 lb

## 2016-09-09 DIAGNOSIS — Z713 Dietary counseling and surveillance: Secondary | ICD-10-CM | POA: Diagnosis not present

## 2016-09-09 DIAGNOSIS — E119 Type 2 diabetes mellitus without complications: Secondary | ICD-10-CM | POA: Diagnosis not present

## 2016-09-09 NOTE — Progress Notes (Signed)
Diabetes Self-Management Education  Visit Type: First/Initial  Appt. Start Time: 1055 Appt. End Time: 1210  09/09/2016  Theresa [name], identified by name and date of birth, is a 76 y.o. female with a diagnosis of Diabetes: Type 2.   ASSESSMENT  Blood pressure 140/86, height 5' (1.524 m), weight 174 lb (78.9 kg). Body mass index is 33.98 kg/m.      Diabetes Self-Management Education - 09/09/16 1400      Visit Information   Visit Type First/Initial     Initial Visit   Diabetes Type Type 2   Are you currently following a meal plan? Yes   What type of meal plan do you follow? "low sugar"   Are you taking your medications as prescribed? Yes   Date Diagnosed 5 months ago     Health Coping   How would you rate your overall health? Good     Psychosocial Assessment   Patient Belief/Attitude about Diabetes Motivated to manage diabetes  "I don't like it"   Self-care barriers None   Self-management support Doctor's office;Family   Other persons present Spouse/SO   Patient Concerns Nutrition/Meal planning;Medication;Monitoring;Healthy Lifestyle;Problem Solving;Glycemic Control;Weight Control   Special Needs None   Preferred Learning Style Auditory;Hands on   Learning Readiness Change in progress   How often do you need to have someone help you when you read instructions, pamphlets, or other written materials from your doctor or pharmacy? 1 - Never   What is the last grade level you completed in school? 12th grade     Pre-Education Assessment   Patient understands the diabetes disease and treatment process. Needs Instruction   Patient understands incorporating nutritional management into lifestyle. Needs Review   Patient undertands incorporating physical activity into lifestyle. Needs Review   Patient understands using medications safely. Needs Instruction   Patient understands monitoring blood glucose, interpreting and using results Needs Review   Patient understands  prevention, detection, and treatment of acute complications. Needs Instruction   Patient understands prevention, detection, and treatment of chronic complications. Needs Instruction   Patient understands how to develop strategies to address psychosocial issues. Needs Instruction   Patient understands how to develop strategies to promote health/change behavior. Needs Instruction     Complications   Last HgB A1C per patient/outside source 5.3 %  06/27/16   How often do you check your blood sugar? 1-2 times/day   Fasting Blood glucose range (mg/dL) 70-129  FBG's 71-102 with 1 reading of 67 mg/dL.    Postprandial Blood glucose range (mg/dL) 70-129;130-179;180-200  Evening readings 112-181 mg/dL.   Number of hypoglycemic episodes per month 1   Can you tell when your blood sugar is low? No  Pt had one FBG of 67 mg/dL but reports no symptoms.    Have you had a dilated eye exam in the past 12 months? Yes   Have you had a dental exam in the past 12 months? Yes   Are you checking your feet? Yes   How many days per week are you checking your feet? 1     Dietary Intake   Breakfast wrap with eggs, canadian bacon, carrots, zucchini, mushrooms; fruit; oatmeal with almonds and 1/2 banana   Lunch salad, sandwich on whole wheat bread   Snack (afternoon) "not many snacks" - sometimes fruit   Dinner meat (chicken, beef, fish, pork) and veggies (collards, greens, squash, red potatoes, sweet potato, seldom corn and peas   Snack (evening) pistachios   Beverage(s) water, diet drinks  Exercise   Exercise Type Moderate (swimming / aerobic walking)   How many days per week to you exercise? 2   How many minutes per day do you exercise? 150   Total minutes per week of exercise 300     Patient Education   Previous Diabetes Education No   Disease state  Definition of diabetes, type 1 and 2, and the diagnosis of diabetes   Nutrition management  Role of diet in the treatment of diabetes and the relationship  between the three main macronutrients and blood glucose level;Food label reading, portion sizes and measuring food.;Reviewed blood glucose goals for pre and post meals and how to evaluate the patients' food intake on their blood glucose level.   Physical activity and exercise  Role of exercise on diabetes management, blood pressure control and cardiac health.   Medications Taught/reviewed insulin injection, site rotation, insulin storage and needle disposal.;Reviewed patients medication for diabetes, action, purpose, timing of dose and side effects.   Monitoring Purpose and frequency of SMBG.;Taught/discussed recording of test results and interpretation of SMBG.;Identified appropriate SMBG and/or A1C goals.   Acute complications Taught treatment of hypoglycemia - the 15 rule.   Chronic complications Relationship between chronic complications and blood glucose control   Psychosocial adjustment Identified and addressed patients feelings and concerns about diabetes     Individualized Goals (developed by patient)   Reducing Risk Improve blood sugars Decrease medications Prevent diabetes complications Lose weight Lead a healthier lifestyle Become more fit     Outcomes   Expected Outcomes Demonstrated interest in learning. Expect positive outcomes   Future DMSE 4-6 wks      Individualized Plan for Diabetes Self-Management Training:   Learning Objective:  Patient will have a greater understanding of diabetes self-management. Patient education plan is to attend individual and/or group sessions per assessed needs and concerns.   Plan:   Patient Instructions  Check blood sugars 2 x day before breakfast and 2 hrs after supper every day Bring blood sugar records to the next class Exercise: Continue water aerobics/swimming  for  2 1/2 hours  2  days a week Eat 3 meals day, 1-2  snacks  a day Space meals 4-6 hours apart Add a protein serving if eating fruit for a snack Carry fast acting glucose  and a snack at all times Rotate injection sites   Expected Outcomes:  Demonstrated interest in learning. Expect positive outcomes  Education material provided:  General Meal Planning Guidelines Simple Meal Plan Symptoms, causes and treatments of Hypoglycemia  If problems or questions, patient to contact team via:  Johny Drilling, RN, [place], CDE [phone]  Future DSME appointment: 4-6 wks  October 06, 2016 for Diabetes Class 1

## 2016-09-09 NOTE — Patient Instructions (Signed)
Check blood sugars 2 x day before breakfast and 2 hrs after supper every day Bring blood sugar records to the next class  Exercise: Continue water aerobics/swimming  for  2 1/2 hours  2  days a week  Eat 3 meals day, 1-2  snacks  a day Space meals 4-6 hours apart Add a protein serving if eating fruit for a snack  Carry fast acting glucose and a snack at all times Rotate injection sites  Return for classes on:

## 2016-10-06 ENCOUNTER — Encounter: Payer: Self-pay | Admitting: Dietician

## 2016-10-06 ENCOUNTER — Encounter: Payer: Medicare Other | Attending: Internal Medicine | Admitting: Dietician

## 2016-10-06 VITALS — Ht 60.0 in | Wt 172.4 lb

## 2016-10-06 DIAGNOSIS — Z713 Dietary counseling and surveillance: Secondary | ICD-10-CM | POA: Diagnosis not present

## 2016-10-06 DIAGNOSIS — E119 Type 2 diabetes mellitus without complications: Secondary | ICD-10-CM | POA: Diagnosis present

## 2016-10-06 NOTE — Progress Notes (Signed)

## 2016-10-13 ENCOUNTER — Encounter: Payer: Self-pay | Admitting: *Deleted

## 2016-10-13 ENCOUNTER — Encounter: Payer: Medicare Other | Admitting: *Deleted

## 2016-10-13 VITALS — Wt 174.7 lb

## 2016-10-13 DIAGNOSIS — E119 Type 2 diabetes mellitus without complications: Secondary | ICD-10-CM | POA: Diagnosis not present

## 2016-10-13 NOTE — Progress Notes (Signed)
Appt. Start Time: 0900 Appt. End Time: 1200  Class 2 Nutritional Management - identify sources of carbohydrate, protein and fat; plan balanced meals; estimate servings of carbohydrates in meals  Psychosocial - identify DM as a source of stress; state the effects of stress on BG control  Exercise - describe the effects of exercise on blood glucose and importance of regular exercise in controlling diabetes; state a plan for personal exercise; verbalize contraindications for exercise  Self-Monitoring - state importance of SMBG; use SMBG results to effectively manage diabetes; identify importance of regular HbA1C testing and goals for results  Acute Complications - recognize hyperglycemia and hypoglycemia with causes and effects; identify blood glucose results as high, low or in control; list steps in treating and preventing high and low blood glucose  Sick Day Guidelines: state appropriate measure to manage blood glucose when ill (need for meds, HBGM plan, when to call physician, need for fluids)  Chronic Complications/Foot, Skin, Eye Dental Care - identify possible long-term complications of diabetes (retinopathy, neuropathy, nephropathy, cardiovascular disease, infections); explain steps in prevention and treatment of chronic complications; state importance of daily self-foot exams; describe how to examine feet and what to look for; explain appropriate eye and dental care  Lifestyle Changes/Goals - state benefits of making appropriate lifestyle changes; identify habits that need to change (meals, tobacco, alcohol); identify strategies to reduce risk factors for personal health  Pregnancy/Sexual Health - state importance of good blood glucose control in preventing sexual problems (impotence, vaginal dryness, infections, loss of desire)  Teaching Materials Used: Class 2 Slide Packet A1C Pamphlet Foot Care Literature Kidney Test Handout Quick and "Balanced" Meal Ideas Carb Counting and Meal  Planning Book Goals for Class 2

## 2016-11-07 ENCOUNTER — Other Ambulatory Visit: Payer: Self-pay | Admitting: Nurse Practitioner

## 2016-11-07 DIAGNOSIS — I509 Heart failure, unspecified: Secondary | ICD-10-CM | POA: Insufficient documentation

## 2016-11-07 DIAGNOSIS — K7460 Unspecified cirrhosis of liver: Secondary | ICD-10-CM

## 2016-11-11 ENCOUNTER — Ambulatory Visit
Admission: RE | Admit: 2016-11-11 | Discharge: 2016-11-11 | Disposition: A | Discharge status: Home / Self Care | Payer: Medicare Other | Source: Ambulatory Visit | Attending: Nurse Practitioner | Admitting: Nurse Practitioner

## 2016-11-11 DIAGNOSIS — I700 Atherosclerosis of aorta: Secondary | ICD-10-CM | POA: Insufficient documentation

## 2016-11-11 DIAGNOSIS — K766 Portal hypertension: Secondary | ICD-10-CM | POA: Insufficient documentation

## 2016-11-11 DIAGNOSIS — K8020 Calculus of gallbladder without cholecystitis without obstruction: Secondary | ICD-10-CM | POA: Diagnosis not present

## 2016-11-11 DIAGNOSIS — R609 Edema, unspecified: Secondary | ICD-10-CM | POA: Insufficient documentation

## 2016-11-11 DIAGNOSIS — K7460 Unspecified cirrhosis of liver: Secondary | ICD-10-CM | POA: Diagnosis not present

## 2016-11-11 DIAGNOSIS — R188 Other ascites: Secondary | ICD-10-CM | POA: Insufficient documentation

## 2016-11-11 DIAGNOSIS — I509 Heart failure, unspecified: Secondary | ICD-10-CM

## 2016-11-11 DIAGNOSIS — K828 Other specified diseases of gallbladder: Secondary | ICD-10-CM | POA: Insufficient documentation

## 2016-11-11 DIAGNOSIS — R161 Splenomegaly, not elsewhere classified: Secondary | ICD-10-CM | POA: Insufficient documentation

## 2016-11-15 ENCOUNTER — Telehealth: Payer: Self-pay | Admitting: Internal Medicine

## 2016-11-15 NOTE — Telephone Encounter (Signed)
Iron Deficienct Anemia. F/U requested per Claretha Cooper, N.P. (MF) Appt schd and conf with patient.

## 2016-11-21 ENCOUNTER — Inpatient Hospital Stay: Payer: Medicare Other | Attending: Internal Medicine | Admitting: Internal Medicine

## 2016-11-21 ENCOUNTER — Ambulatory Visit
Admission: RE | Admit: 2016-11-21 | Discharge: 2016-11-21 | Disposition: A | Discharge status: Home / Self Care | Payer: Medicare Other | Source: Ambulatory Visit | Attending: Internal Medicine | Admitting: Internal Medicine

## 2016-11-21 DIAGNOSIS — R0609 Other forms of dyspnea: Secondary | ICD-10-CM | POA: Insufficient documentation

## 2016-11-21 DIAGNOSIS — D500 Iron deficiency anemia secondary to blood loss (chronic): Secondary | ICD-10-CM | POA: Insufficient documentation

## 2016-11-21 DIAGNOSIS — D731 Hypersplenism: Secondary | ICD-10-CM | POA: Diagnosis not present

## 2016-11-21 DIAGNOSIS — G8929 Other chronic pain: Secondary | ICD-10-CM | POA: Diagnosis not present

## 2016-11-21 DIAGNOSIS — J90 Pleural effusion, not elsewhere classified: Secondary | ICD-10-CM | POA: Insufficient documentation

## 2016-11-21 DIAGNOSIS — E785 Hyperlipidemia, unspecified: Secondary | ICD-10-CM | POA: Insufficient documentation

## 2016-11-21 DIAGNOSIS — I517 Cardiomegaly: Secondary | ICD-10-CM | POA: Diagnosis not present

## 2016-11-21 DIAGNOSIS — D696 Thrombocytopenia, unspecified: Secondary | ICD-10-CM | POA: Diagnosis not present

## 2016-11-21 DIAGNOSIS — K219 Gastro-esophageal reflux disease without esophagitis: Secondary | ICD-10-CM | POA: Insufficient documentation

## 2016-11-21 DIAGNOSIS — Z79899 Other long term (current) drug therapy: Secondary | ICD-10-CM | POA: Diagnosis not present

## 2016-11-21 DIAGNOSIS — M549 Dorsalgia, unspecified: Secondary | ICD-10-CM | POA: Insufficient documentation

## 2016-11-21 DIAGNOSIS — K7460 Unspecified cirrhosis of liver: Secondary | ICD-10-CM | POA: Diagnosis not present

## 2016-11-21 DIAGNOSIS — I2510 Atherosclerotic heart disease of native coronary artery without angina pectoris: Secondary | ICD-10-CM | POA: Diagnosis not present

## 2016-11-21 DIAGNOSIS — Z885 Allergy status to narcotic agent status: Secondary | ICD-10-CM | POA: Insufficient documentation

## 2016-11-21 DIAGNOSIS — Z951 Presence of aortocoronary bypass graft: Secondary | ICD-10-CM | POA: Insufficient documentation

## 2016-11-21 DIAGNOSIS — R918 Other nonspecific abnormal finding of lung field: Secondary | ICD-10-CM | POA: Insufficient documentation

## 2016-11-21 DIAGNOSIS — R0601 Orthopnea: Secondary | ICD-10-CM

## 2016-11-21 DIAGNOSIS — R0989 Other specified symptoms and signs involving the circulatory and respiratory systems: Secondary | ICD-10-CM | POA: Diagnosis not present

## 2016-11-21 DIAGNOSIS — Z86010 Personal history of colonic polyps: Secondary | ICD-10-CM | POA: Insufficient documentation

## 2016-11-21 DIAGNOSIS — Z794 Long term (current) use of insulin: Secondary | ICD-10-CM | POA: Diagnosis not present

## 2016-11-21 DIAGNOSIS — I110 Hypertensive heart disease with heart failure: Secondary | ICD-10-CM | POA: Insufficient documentation

## 2016-11-21 DIAGNOSIS — G4730 Sleep apnea, unspecified: Secondary | ICD-10-CM | POA: Insufficient documentation

## 2016-11-21 DIAGNOSIS — R600 Localized edema: Secondary | ICD-10-CM | POA: Insufficient documentation

## 2016-11-21 DIAGNOSIS — D472 Monoclonal gammopathy: Secondary | ICD-10-CM | POA: Diagnosis not present

## 2016-11-21 DIAGNOSIS — Z7982 Long term (current) use of aspirin: Secondary | ICD-10-CM | POA: Diagnosis not present

## 2016-11-21 DIAGNOSIS — R5383 Other fatigue: Secondary | ICD-10-CM | POA: Diagnosis not present

## 2016-11-21 DIAGNOSIS — R590 Localized enlarged lymph nodes: Secondary | ICD-10-CM | POA: Diagnosis not present

## 2016-11-21 DIAGNOSIS — I509 Heart failure, unspecified: Secondary | ICD-10-CM | POA: Diagnosis not present

## 2016-11-21 DIAGNOSIS — E119 Type 2 diabetes mellitus without complications: Secondary | ICD-10-CM | POA: Diagnosis not present

## 2016-11-21 NOTE — Progress Notes (Signed)
[place] OFFICE PROGRESS NOTE  Patient Care Team: Tracie Harrier, MD as PCP - General (Internal Medicine)   SUMMARY OF HEMATOLOGIC- ONCOLOGIC HISTORY:  # Anemia iron deficiency- ? 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 Patient's hemoglobin with her PCP was anywhere between 9-10. Patient was started on iron by PCP recently.  She admits to black colored stools attributable to iron pills. She otherwise denies any blood in stools. Denies any swelling in the legs. Admits to easy bruising otherwise denies any spontaneous pending.   Patient complains of worsening shortness of breath with exertion. She also complains of orthopnea. She admits to mild cough nonproductive. No hemoptysis. Also noted to have a small lump under the right neck. It's not growing in size. In there for about 1 month.  Complains of mild fatigue. No bone pain. No nausea no vomiting.  REVIEW OF SYSTEMS:  A complete 10 point review of system is done which is negative except mentioned above/history of present illness.   PAST MEDICAL HISTORY :  Past Medical History:  Diagnosis Date  . Anemia   . Asthma   . CAD (coronary artery disease)   . CHF (congestive heart failure) ([place])   . Chronic low back pain   . Cirrhosis of liver not due to alcohol ([place])   . Colon polyps   . Diabetes mellitus without complication ([place])   . GERD (gastroesophageal reflux disease)   . Hyperlipidemia   . Hypertension   . IDA (iron deficiency anemia)   . 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 Sleep apnea   . SOB (shortness  of breath)   . Thrombocytopenia ([place])     PAST SURGICAL HISTORY :   Past Surgical History:  Procedure Laterality Date  . COLONOSCOPY  10/24/2013, 2002   hyperplastic polpys  . CORONARY ARTERY BYPASS GRAFT  09/1998  . ESOPHAGOGASTRODUODENOSCOPY (EGD) WITH PROPOFOL N/A 08/26/2016   Procedure: ESOPHAGOGASTRODUODENOSCOPY (EGD) WITH PROPOFOL;  Surgeon: Manya Silvas, MD;  Location: [facility] ENDOSCOPY;  Service: Endoscopy;  Laterality: N/A;  . ESOPHAGOGASTRODUODENOSCOPY ENDOSCOPY  05/24/2011  . hemorroid surgery    . JOINT REPLACEMENT     bil.knees  . TOTAL HIP ARTHROPLASTY Left 03/2003    FAMILY HISTORY :   Family History  Problem Relation Age of Onset  . Heart disease Unknown   . Hypertension Unknown   . Anemia Unknown   . Colon cancer Unknown   . CVA Mother   . Dementia Mother   . CAD Father     SOCIAL HISTORY:   Social History  Substance Use Topics  . Smoking status: Never Smoker  . Smokeless tobacco: Never Used  . Alcohol use No    ALLERGIES:  is allergic to latex; morphine and related; adhesive [tape]; and other.  MEDICATIONS:  Current Outpatient Prescriptions  Medication Sig Dispense Refill  . aspirin EC 81 MG tablet Take 81 mg by mouth daily.    . blood glucose meter kit and supplies KIT Dispense based on patient and insurance preference. Use up to four times daily as directed. (FOR ICD-9 250.00, 250.01). 1 each 0  . Calcium Carbonate-Vitamin D (CALCIUM-VITAMIN D) 500-200 MG-UNIT tablet Take 1 tablet by mouth 2 (  two) times daily.     . ferrous sulfate 325 (65 FE) MG tablet Take 325 mg by mouth 2 (two) times daily with a meal.    . folic acid (FOLVITE) 878 MCG tablet Take 400 mcg by mouth daily.     . furosemide (LASIX) 20 MG tablet Take 20 mg by mouth daily.    Marland Kitchen gabapentin (NEURONTIN) 100 MG capsule Take 200 mg by mouth at bedtime.     . insulin detemir (LEVEMIR) 100 unit/ml SOLN Inject 0.2 mLs (20 Units total) into the skin at bedtime. 100 mL 2  . levothyroxine  (SYNTHROID, LEVOTHROID) 50 MCG tablet TAKE 1 TABLET DAILY    . lisinopril (PRINIVIL,ZESTRIL) 40 MG tablet Take 10 mg by mouth daily.    . Melatonin 3 MG TABS Take 3 mg by mouth at bedtime.     . Milk Thistle 250 MG CAPS Take 3 capsules by mouth daily.    . Multiple Vitamin (MULTI-VITAMINS) TABS Take 1 tablet by mouth daily.     Marland Kitchen omeprazole (PRILOSEC) 20 MG capsule TAKE 1 CAPSULE DAILY    . potassium chloride (K-DUR) 10 MEQ tablet Take 1 tablet by mouth daily.    . rifaximin (XIFAXAN) 550 MG TABS tablet Take 550 mg by mouth 2 (two) times daily.     . simvastatin (ZOCOR) 20 MG tablet TAKE 1 TABLET EVERY EVENING FOR CHOLESTEROL    . vitamin C (ASCORBIC ACID) 500 MG tablet Take 500 mg by mouth daily.     Marland Kitchen zinc gluconate 50 MG tablet Take 50 mg by mouth daily.    . Insulin Lispro (HUMALOG KWIKPEN [place]) Inject into the skin 3 (three) times daily before meals.     No current facility-administered medications for this visit.     PHYSICAL EXAMINATION:   BP 139/81 (BP Location: Left Arm, Patient Position: Sitting)   Pulse 69   Temp 98.2 F (36.8 C) (Tympanic)   Ht 5' (1.524 m)   Wt 183 lb 6.4 oz (83.2 kg)   BMI 35.82 kg/m   Filed Weights   11/21/16 1520  Weight: 183 lb 6.4 oz (83.2 kg)    GENERAL: Well-nourished well-developed; Alert, no distress and comfortable.  Accompanied by her husband.  EYES: no pallor or icterus OROPHARYNX: no thrush or ulceration; good dentition  NECK: supple, no masses felt LYMPH:  no palpable lymphadenopathy in axillary or inguinal regions. ? Right submandibular LN approximately 1 cm in size. LUNGS decreased breath sounds bilaterally at the bases; No wheeze or crackles HEART/CVS: regular rate & rhythm and no murmurs; No lower extremity edema ABDOMEN:abdomen soft, non-tender and normal bowel sounds; positive for splenomegaly.   Musculoskeletal:no cyanosis of digits and no clubbing  PSYCH: alert & oriented x 3 with fluent speech NEURO: no focal motor/sensory  deficits SKIN:  no rashes or significant lesions  LABORATORY DATA:  I have reviewed the data as listed    Component Value Date/Time   NA 136 04/24/2016 0523   NA 141 02/03/2014 0643   K 3.5 04/24/2016 0523   K 3.6 02/03/2014 0643   CL 103 04/24/2016 0523   CL 109 (H) 02/03/2014 0643   CO2 27 04/24/2016 0523   CO2 25 02/03/2014 0643   GLUCOSE 188 (H) 04/24/2016 0523   GLUCOSE 93 02/03/2014 0643   BUN 14 04/24/2016 0523   BUN 15 02/03/2014 0643   CREATININE 1.11 (H) 04/24/2016 0523   CREATININE 0.99 07/14/2014 1454   CALCIUM 8.7 (L) 04/24/2016 6767  CALCIUM 8.6 (L) 07/14/2014 1454   PROT 7.8 04/22/2016 1318   PROT 7.1 11/28/2012 1935   ALBUMIN 3.7 04/22/2016 1318   ALBUMIN 3.1 (L) 11/28/2012 1935   AST 45 (H) 04/22/2016 1318   AST 40 (H) 11/28/2012 1935   ALT 35 04/22/2016 1318   ALT 37 11/28/2012 1935   ALKPHOS 115 04/22/2016 1318   ALKPHOS 135 11/28/2012 1935   BILITOT 1.9 (H) 04/22/2016 1318   BILITOT 0.8 11/28/2012 1935   GFRNONAA 47 (L) 04/24/2016 0523   GFRNONAA 56 (L) 07/14/2014 1454   GFRAA 54 (L) 04/24/2016 0523   GFRAA >60 07/14/2014 1454    No results found for: SPEP, UPEP  Lab Results  Component Value Date   WBC 2.9 (L) 04/23/2016   NEUTROABS 2.6 04/22/2016   HGB 11.0 (L) 04/23/2016   HCT 30.9 (L) 04/23/2016   MCV 83.1 04/23/2016   PLT 52 (L) 04/23/2016      Chemistry      Component Value Date/Time   NA 136 04/24/2016 0523   NA 141 02/03/2014 0643   K 3.5 04/24/2016 0523   K 3.6 02/03/2014 0643   CL 103 04/24/2016 0523   CL 109 (H) 02/03/2014 0643   CO2 27 04/24/2016 0523   CO2 25 02/03/2014 0643   BUN 14 04/24/2016 0523   BUN 15 02/03/2014 0643   CREATININE 1.11 (H) 04/24/2016 0523   CREATININE 0.99 07/14/2014 1454      Component Value Date/Time   CALCIUM 8.7 (L) 04/24/2016 0523   CALCIUM 8.6 (L) 07/14/2014 1454   ALKPHOS 115 04/22/2016 1318   ALKPHOS 135 11/28/2012 1935   AST 45 (H) 04/22/2016 1318   AST 40 (H) 11/28/2012 1935    ALT 35 04/22/2016 1318   ALT 37 11/28/2012 1935   BILITOT 1.9 (H) 04/22/2016 1318   BILITOT 0.8 11/28/2012 1935       RADIOGRAPHIC STUDIES: I have personally reviewed the radiological images as listed and agreed with the findings in the report. Dg Chest 2 View  Result Date: 11/21/2016 CLINICAL DATA:  Shortness of breath and congestion for 1 week. EXAM: CHEST  2 VIEW COMPARISON:  04/22/2016 and prior radiographs FINDINGS: Cardiomegaly and CABG changes noted. Pulmonary vascular congestion identified. A small right pleural effusion is present. No evidence of airspace disease or pneumothorax. No acute bony abnormalities are identified. IMPRESSION: 1. Pulmonary vascular congestion and small right pleural effusion 2. Cardiomegaly Electronically Signed   By: Margarette Canada M.D.   On: 11/21/2016 17:33     ASSESSMENT & PLAN:  Iron deficiency anemia due to chronic blood loss # Anemia-  Likely iron deficiency of unclear etiology-possible chronic GI bleed secondary to cirrhosis. Most recent hemoglobin August 2018 with PCP 9-10. Iron studies not available. Patient currently on by mouth iron. Tolerating well; recheck labs in 2 months. If not improved and recommend IV iron.  # cough/SOB/ leg swelling- question CHF versus others. Patient awaiting 2-D echo on September 4 with Dr. Nehemiah Massed.   # Jan 2018- MGUS- M protein 0.6, Kappa/Lambda 2.32. Would repeat at next visit.  # right neck nodule- x1 month; if not better; would recommend CT scan.   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 Decreased but stable.   #  Cirrhosis/ nonalcoholic-  Discussed the risk of  Hepatocellular cancer in patient cirrhosis. Recent imaging negative for any lesions/AFP negative.   # Follow-up in 2 months/labs.   Addendum: Chest x-ray suggestive of fluid overload. Recommend increasing  Lasix to 20 twice a day; also add potassium 20 milliequivalents x 1 week; and follow up with Dr.Kowalski's  office.      Cammie Sickle, MD 11/22/2016 8:18 AM

## 2016-11-21 NOTE — Assessment & Plan Note (Addendum)
#   Anemia-  Likely iron deficiency of unclear etiology-possible chronic GI bleed secondary to cirrhosis. Most recent hemoglobin August 2018 with PCP 9-10. Iron studies not available. Patient currently on by mouth iron. Tolerating well; recheck labs in 2 months. If not improved and recommend IV iron.  # cough/SOB/ leg swelling- question CHF versus others. Patient awaiting 2-D echo on September 4 with Dr. Kowalski.   # Jan 2018- MGUS- M protein 0.6, Kappa/Lambda 2.32. Would repeat at next visit.  # right neck nodule- x1 month; if not better; would recommend CT scan.   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 Decreased but stable.   #  Cirrhosis/ nonalcoholic-  Discussed the risk of  Hepatocellular cancer in patient cirrhosis. Recent imaging negative for any lesions/AFP negative.   # Follow-up in 2 months/labs.   Addendum: Chest x-ray suggestive of fluid overload. Recommend increasing Lasix to 20 twice a day; also add potassium 20 milliequivalents x 1 week; and follow up with Dr.Kowalski's office.  

## 2016-11-22 ENCOUNTER — Telehealth: Payer: Self-pay | Admitting: Internal Medicine

## 2016-11-22 ENCOUNTER — Encounter: Payer: Self-pay | Admitting: *Deleted

## 2016-11-22 DIAGNOSIS — I509 Heart failure, unspecified: Secondary | ICD-10-CM

## 2016-11-22 DIAGNOSIS — E876 Hypokalemia: Secondary | ICD-10-CM

## 2016-11-22 MED ORDER — POTASSIUM CHLORIDE CRYS ER 20 MEQ PO TBCR: 20.0000 meq | EXTENDED_RELEASE_TABLET | Freq: Two times a day (BID) | ORAL | 0 refills | Status: DC

## 2016-11-22 MED ORDER — POTASSIUM CHLORIDE CRYS ER 20 MEQ PO TBCR
20.0000 meq | EXTENDED_RELEASE_TABLET | Freq: Two times a day (BID) | ORAL | 0 refills | Status: DC
Start: 2016-11-22 — End: 2016-11-22

## 2016-11-22 NOTE — Telephone Encounter (Addendum)
Contacted patient. Patient states that she has lasix 40 mg tablets that she is currently taking once daily. Patient not on lasix 20 mg as previously stated that she was on at last office visit. She only has 10 meq Kdur at home. Dr. Rogue Bussing informed on patient's current dosing. New recommendations provided. Patient needs to take Lasix 40 mg twice daily and potassium 20 mEq twice daily x 1 week. Teach back process performed with patient. Patient gave verbal understanding to call Dr. Alveria Apley office asap to make an apt for mgmt of chf.  New rx for potasssium sent to patient's [facility].

## 2016-11-22 NOTE — Telephone Encounter (Signed)
Please inform pt that- Chest x-ray suggestive of fluid overload sec to congestive heart failure.  # Recommend increasing Lasix to 20 twice a day; also add potassium 20 milliequivalents x 1 week [please send script];   # ask pt follow up/call with Dr.Kowalski's office. Re: further management.

## 2016-11-22 NOTE — Addendum Note (Signed)
Addended by: Sabino Gasser on: 11/22/2016 11:25 AM   Modules accepted: Orders

## 2016-12-15 ENCOUNTER — Encounter: Payer: Medicare Other | Attending: Internal Medicine | Admitting: Dietician

## 2016-12-15 ENCOUNTER — Encounter: Payer: Self-pay | Admitting: Dietician

## 2016-12-15 VITALS — BP 120/80 | Ht 60.0 in | Wt 169.5 lb

## 2016-12-15 DIAGNOSIS — Z713 Dietary counseling and surveillance: Secondary | ICD-10-CM | POA: Insufficient documentation

## 2016-12-15 DIAGNOSIS — E119 Type 2 diabetes mellitus without complications: Secondary | ICD-10-CM | POA: Diagnosis present

## 2016-12-15 NOTE — Progress Notes (Signed)

## 2016-12-16 ENCOUNTER — Encounter: Payer: Self-pay | Admitting: *Deleted

## 2017-01-16 ENCOUNTER — Inpatient Hospital Stay: Payer: Medicare Other | Attending: Internal Medicine

## 2017-01-16 DIAGNOSIS — D696 Thrombocytopenia, unspecified: Secondary | ICD-10-CM | POA: Diagnosis not present

## 2017-01-16 DIAGNOSIS — D472 Monoclonal gammopathy: Secondary | ICD-10-CM | POA: Insufficient documentation

## 2017-01-16 DIAGNOSIS — D731 Hypersplenism: Secondary | ICD-10-CM | POA: Insufficient documentation

## 2017-01-16 DIAGNOSIS — D500 Iron deficiency anemia secondary to blood loss (chronic): Secondary | ICD-10-CM

## 2017-01-16 DIAGNOSIS — R0601 Orthopnea: Secondary | ICD-10-CM

## 2017-01-16 LAB — BASIC METABOLIC PANEL
ANION GAP: 10 (ref 5–15)
BUN: 24 mg/dL — AB (ref 6–20)
CALCIUM: 9 mg/dL (ref 8.9–10.3)
CO2: 29 mmol/L (ref 22–32)
CREATININE: 1.15 mg/dL — AB (ref 0.44–1.00)
Chloride: 97 mmol/L — ABNORMAL LOW (ref 101–111)
GFR calc Af Amer: 51 mL/min — ABNORMAL LOW (ref >60)
GFR, EST NON AFRICAN AMERICAN: 44 mL/min — AB (ref >60)
GLUCOSE: 204 mg/dL — AB (ref 65–99)
Potassium: 3.1 mmol/L — ABNORMAL LOW (ref 3.5–5.1)
Sodium: 136 mmol/L (ref 135–145)

## 2017-01-16 LAB — IRON AND TIBC
Iron: 154 ug/dL (ref 28–170)
Saturation Ratios: 56 % — ABNORMAL HIGH (ref 10.4–31.8)
TIBC: 273 ug/dL (ref 250–450)
UIBC: 119 ug/dL

## 2017-01-16 LAB — CBC WITH DIFFERENTIAL/PLATELET
BASOS ABS: 0 10*3/uL (ref 0–0.1)
BASOS PCT: 1 %
EOS ABS: 0.1 10*3/uL (ref 0–0.7)
EOS PCT: 2 %
HCT: 34.2 % — ABNORMAL LOW (ref 35.0–47.0)
Hemoglobin: 11.9 g/dL — ABNORMAL LOW (ref 12.0–16.0)
Lymphocytes Relative: 17 %
Lymphs Abs: 0.7 10*3/uL — ABNORMAL LOW (ref 1.0–3.6)
MCH: 29.5 pg (ref 26.0–34.0)
MCHC: 34.7 g/dL (ref 32.0–36.0)
MCV: 85 fL (ref 80.0–100.0)
MONO ABS: 0.3 10*3/uL (ref 0.2–0.9)
Monocytes Relative: 7 %
NEUTROS ABS: 3.3 10*3/uL (ref 1.4–6.5)
Neutrophils Relative %: 73 %
Platelets: 71 10*3/uL — ABNORMAL LOW (ref 150–440)
RBC: 4.02 MIL/uL (ref 3.80–5.20)
RDW: 18.3 % — AB (ref 11.5–14.5)
WBC: 4.4 10*3/uL (ref 3.6–11.0)

## 2017-01-16 LAB — FERRITIN: Ferritin: 66 ng/mL (ref 11–307)

## 2017-01-17 LAB — KAPPA/LAMBDA LIGHT CHAINS
KAPPA, LAMDA LIGHT CHAIN RATIO: 2.46 — AB (ref 0.26–1.65)
Kappa free light chain: 79.1 mg/L — ABNORMAL HIGH (ref 3.3–19.4)
LAMDA FREE LIGHT CHAINS: 32.2 mg/L — AB (ref 5.7–26.3)

## 2017-01-18 LAB — MULTIPLE MYELOMA PANEL, SERUM
ALBUMIN SERPL ELPH-MCNC: 3.1 g/dL (ref 2.9–4.4)
ALBUMIN/GLOB SERPL: 0.8 (ref 0.7–1.7)
ALPHA 1: 0.2 g/dL (ref 0.0–0.4)
Alpha2 Glob SerPl Elph-Mcnc: 0.5 g/dL (ref 0.4–1.0)
B-Globulin SerPl Elph-Mcnc: 0.8 g/dL (ref 0.7–1.3)
GAMMA GLOB SERPL ELPH-MCNC: 2.6 g/dL — AB (ref 0.4–1.8)
Globulin, Total: 4.3 g/dL — ABNORMAL HIGH (ref 2.2–3.9)
IGA: 191 mg/dL (ref 64–422)
IGG (IMMUNOGLOBIN G), SERUM: 2155 mg/dL — AB (ref 700–1600)
IGM (IMMUNOGLOBULIN M), SRM: 324 mg/dL — AB (ref 26–217)
M PROTEIN SERPL ELPH-MCNC: 1 g/dL — AB
Total Protein ELP: 7.4 g/dL (ref 6.0–8.5)

## 2017-01-23 ENCOUNTER — Inpatient Hospital Stay: Payer: Medicare Other | Admitting: Internal Medicine

## 2017-01-23 NOTE — Progress Notes (Deleted)
[facility] OFFICE PROGRESS NOTE  Patient Care Team: Tracie Harrier, MD as PCP - General (Internal Medicine)   SUMMARY OF HEMATOLOGIC- ONCOLOGIC HISTORY:  # Anemia iron deficiency- ? 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 Patient's hemoglobin with her PCP was anywhere between 9-10. Patient was started on iron by PCP recently.  She admits to black colored stools attributable to iron pills. She otherwise denies any blood in stools. Denies any swelling in the legs. Admits to easy bruising otherwise denies any spontaneous pending.   Patient complains of worsening shortness of breath with exertion. She also complains of orthopnea. She admits to mild cough nonproductive. No hemoptysis. Also noted to have a small lump under the right neck. It's not growing in size. In there for about 1 month.  Complains of mild fatigue. No bone pain. No nausea no vomiting.  REVIEW OF SYSTEMS:  A complete 10 point review of system is done which is negative except mentioned above/history of present illness.   PAST MEDICAL HISTORY :  Past Medical History:  Diagnosis Date  . Anemia   . Asthma   . CAD (coronary artery disease)   . CHF (congestive heart failure) ([place])   . Chronic low back pain   . Cirrhosis of liver not due to alcohol ([place])   . Colon polyps   . Diabetes mellitus without complication ([place])   . GERD (gastroesophageal reflux disease)   . Hyperlipidemia   . Hypertension   . IDA (iron deficiency anemia)   . 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 Sleep apnea   . SOB (shortness  of breath)   . Thrombocytopenia ([place])     PAST SURGICAL HISTORY :   Past Surgical History:  Procedure Laterality Date  . COLONOSCOPY  10/24/2013, 2002   hyperplastic polpys  . CORONARY ARTERY BYPASS GRAFT  09/1998  . ESOPHAGOGASTRODUODENOSCOPY (EGD) WITH PROPOFOL N/A 08/26/2016   Procedure: ESOPHAGOGASTRODUODENOSCOPY (EGD) WITH PROPOFOL;  Surgeon: Manya Silvas, MD;  Location[facility] ENDOSCOPY;  Service: Endoscopy;  Laterality: N/A;  . ESOPHAGOGASTRODUODENOSCOPY ENDOSCOPY  05/24/2011  . hemorroid surgery    . JOINT REPLACEMENT     bil.knees  . TOTAL HIP ARTHROPLASTY Left 03/2003    FAMILY HISTORY :   Family History  Problem Relation Age of Onset  . Heart disease Unknown   . Hypertension Unknown   . Anemia Unknown   . Colon cancer Unknown   . CVA Mother   . Dementia Mother   . CAD Father     SOCIAL HISTORY:   Social History  Substance Use Topics  . Smoking status: Never Smoker  . Smokeless tobacco: Never Used  . Alcohol use No    ALLERGIES:  is allergic to latex; morphine and related; adhesive [tape]; and other.  MEDICATIONS:  Current Outpatient Prescriptions  Medication Sig Dispense Refill  . aspirin EC 81 MG tablet Take 81 mg by mouth daily.    . blood glucose meter kit and supplies KIT Dispense based on patient and insurance preference. Use up to four times daily as directed. (FOR ICD-9 250.00, 250.01). 1 each 0  . Calcium Carbonate-Vitamin D (CALCIUM-VITAMIN D) 500-200 MG-UNIT tablet Take 1 tablet by mouth 2 (  two) times daily.     . ferrous sulfate 325 (65 FE) MG tablet Take 1 tablet by mouth daily.    . folic acid (FOLVITE) 122 MCG tablet Take 400 mcg by mouth daily.     Marland Kitchen gabapentin (NEURONTIN) 100 MG capsule Take 200 mg by mouth at bedtime.     . insulin detemir (LEVEMIR) 100 unit/ml SOLN Inject 0.2 mLs (20 Units total) into the skin at bedtime. 100 mL 2  . Insulin Lispro (HUMALOG KWIKPEN [place]) Inject into the skin 3 (three) times daily before meals.    Marland Kitchen  levothyroxine (SYNTHROID, LEVOTHROID) 50 MCG tablet TAKE 1 TABLET DAILY    . lisinopril (PRINIVIL,ZESTRIL) 40 MG tablet Take 10 mg by mouth daily.    . Melatonin 3 MG TABS Take 3 mg by mouth at bedtime.     . Milk Thistle 250 MG CAPS Take 3 capsules by mouth daily.    . Multiple Vitamin (MULTI-VITAMINS) TABS Take 1 tablet by mouth daily.     Marland Kitchen omeprazole (PRILOSEC) 20 MG capsule TAKE 1 CAPSULE DAILY    . potassium chloride (K-DUR) 10 MEQ tablet Take 1 tablet by mouth daily.    . potassium chloride SA (K-DUR,KLOR-CON) 20 MEQ tablet Take 1 tablet (20 mEq total) by mouth 2 (two) times daily. 14 tablet 0  . rifaximin (XIFAXAN) 550 MG TABS tablet Take 550 mg by mouth 2 (two) times daily.     . simvastatin (ZOCOR) 20 MG tablet TAKE 1 TABLET EVERY EVENING FOR CHOLESTEROL    . torsemide (DEMADEX) 20 MG tablet Take 1 tablet by mouth 2 (two) times daily.    . vitamin C (ASCORBIC ACID) 500 MG tablet Take 500 mg by mouth daily.     Marland Kitchen zinc gluconate 50 MG tablet Take 50 mg by mouth daily.     No current facility-administered medications for this visit.     PHYSICAL EXAMINATION:   There were no vitals taken for this visit.  There were no vitals filed for this visit.  GENERAL: Well-nourished well-developed; Alert, no distress and comfortable.  Accompanied by her husband.  EYES: no pallor or icterus OROPHARYNX: no thrush or ulceration; good dentition  NECK: supple, no masses felt LYMPH:  no palpable lymphadenopathy in axillary or inguinal regions. ? Right submandibular LN approximately 1 cm in size. LUNGS decreased breath sounds bilaterally at the bases; No wheeze or crackles HEART/CVS: regular rate & rhythm and no murmurs; No lower extremity edema ABDOMEN:abdomen soft, non-tender and normal bowel sounds; positive for splenomegaly.   Musculoskeletal:no cyanosis of digits and no clubbing  PSYCH: alert & oriented x 3 with fluent speech NEURO: no focal motor/sensory deficits SKIN:  no rashes or  significant lesions  LABORATORY DATA:  I have reviewed the data as listed    Component Value Date/Time   NA 136 01/16/2017 1445   NA 141 02/03/2014 0643   K 3.1 (L) 01/16/2017 1445   K 3.6 02/03/2014 0643   CL 97 (L) 01/16/2017 1445   CL 109 (H) 02/03/2014 0643   CO2 29 01/16/2017 1445   CO2 25 02/03/2014 0643   GLUCOSE 204 (H) 01/16/2017 1445   GLUCOSE 93 02/03/2014 0643   BUN 24 (H) 01/16/2017 1445   BUN 15 02/03/2014 0643   CREATININE 1.15 (H) 01/16/2017 1445   CREATININE 0.99 07/14/2014 1454   CALCIUM 9.0 01/16/2017 1445   CALCIUM 8.6 (L) 07/14/2014 1454   PROT 7.8 04/22/2016 1318   PROT 7.1 11/28/2012 1935  ALBUMIN 3.7 04/22/2016 1318   ALBUMIN 3.1 (L) 11/28/2012 1935   AST 45 (H) 04/22/2016 1318   AST 40 (H) 11/28/2012 1935   ALT 35 04/22/2016 1318   ALT 37 11/28/2012 1935   ALKPHOS 115 04/22/2016 1318   ALKPHOS 135 11/28/2012 1935   BILITOT 1.9 (H) 04/22/2016 1318   BILITOT 0.8 11/28/2012 1935   GFRNONAA 44 (L) 01/16/2017 1445   GFRNONAA 56 (L) 07/14/2014 1454   GFRAA 51 (L) 01/16/2017 1445   GFRAA >60 07/14/2014 1454    No results found for: SPEP, UPEP  Lab Results  Component Value Date   WBC 4.4 01/16/2017   NEUTROABS 3.3 01/16/2017   HGB 11.9 (L) 01/16/2017   HCT 34.2 (L) 01/16/2017   MCV 85.0 01/16/2017   PLT 71 (L) 01/16/2017      Chemistry      Component Value Date/Time   NA 136 01/16/2017 1445   NA 141 02/03/2014 0643   K 3.1 (L) 01/16/2017 1445   K 3.6 02/03/2014 0643   CL 97 (L) 01/16/2017 1445   CL 109 (H) 02/03/2014 0643   CO2 29 01/16/2017 1445   CO2 25 02/03/2014 0643   BUN 24 (H) 01/16/2017 1445   BUN 15 02/03/2014 0643   CREATININE 1.15 (H) 01/16/2017 1445   CREATININE 0.99 07/14/2014 1454      Component Value Date/Time   CALCIUM 9.0 01/16/2017 1445   CALCIUM 8.6 (L) 07/14/2014 1454   ALKPHOS 115 04/22/2016 1318   ALKPHOS 135 11/28/2012 1935   AST 45 (H) 04/22/2016 1318   AST 40 (H) 11/28/2012 1935   ALT 35 04/22/2016  1318   ALT 37 11/28/2012 1935   BILITOT 1.9 (H) 04/22/2016 1318   BILITOT 0.8 11/28/2012 1935       RADIOGRAPHIC STUDIES: I have personally reviewed the radiological images as listed and agreed with the findings in the report. No results found.   ASSESSMENT & PLAN:  No problem-specific Assessment & Plan notes found for this encounter.     Cammie Sickle, MD 01/23/2017 8:48 AM

## 2017-01-23 NOTE — Assessment & Plan Note (Deleted)
#   Anemia-  Likely iron deficiency of unclear etiology-possible chronic GI bleed secondary to cirrhosis. Most recent hemoglobin August 2018 with PCP 9-10. Iron studies not available. Patient currently on by mouth iron. Tolerating well; recheck labs in 2 months. If not improved and recommend IV iron.  # cough/SOB/ leg swelling- question CHF versus others. Patient awaiting 2-D echo on September 4 with Dr. Nehemiah Massed.   # Jan 2018- MGUS- M protein 0.6, Kappa/Lambda 2.32. Would repeat at next visit.  # right neck nodule- x1 month; if not better; would recommend CT scan.   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 Decreased but stable.   #  Cirrhosis/ nonalcoholic-  Discussed the risk of  Hepatocellular cancer in patient cirrhosis. Recent imaging negative for any lesions/AFP negative.   # Follow-up in 2 months/labs.   Addendum: Chest x-ray suggestive of fluid overload. Recommend increasing Lasix to 20 twice a day; also add potassium 20 milliequivalents x 1 week; and follow up with Dr.Kowalski's office.

## 2017-02-06 ENCOUNTER — Other Ambulatory Visit: Payer: Self-pay | Admitting: Internal Medicine

## 2017-02-06 DIAGNOSIS — Z1231 Encounter for screening mammogram for malignant neoplasm of breast: Secondary | ICD-10-CM

## 2017-03-01 ENCOUNTER — Ambulatory Visit
Admission: RE | Admit: 2017-03-01 | Discharge: 2017-03-01 | Disposition: A | Discharge status: Home / Self Care | Payer: Medicare Other | Source: Ambulatory Visit | Attending: Internal Medicine | Admitting: Internal Medicine

## 2017-03-01 DIAGNOSIS — Z1231 Encounter for screening mammogram for malignant neoplasm of breast: Secondary | ICD-10-CM | POA: Insufficient documentation

## 2017-06-05 ENCOUNTER — Other Ambulatory Visit: Payer: Self-pay | Admitting: Internal Medicine

## 2017-06-05 DIAGNOSIS — K7460 Unspecified cirrhosis of liver: Secondary | ICD-10-CM

## 2017-06-05 DIAGNOSIS — K7581 Nonalcoholic steatohepatitis (NASH): Principal | ICD-10-CM

## 2017-06-09 ENCOUNTER — Ambulatory Visit
Admission: RE | Admit: 2017-06-09 | Discharge: 2017-06-09 | Disposition: A | Discharge status: Home / Self Care | Payer: Medicare Other | Source: Ambulatory Visit | Attending: Internal Medicine | Admitting: Internal Medicine

## 2017-06-09 DIAGNOSIS — R161 Splenomegaly, not elsewhere classified: Secondary | ICD-10-CM | POA: Insufficient documentation

## 2017-06-09 DIAGNOSIS — K7581 Nonalcoholic steatohepatitis (NASH): Secondary | ICD-10-CM | POA: Insufficient documentation

## 2017-06-09 DIAGNOSIS — K7460 Unspecified cirrhosis of liver: Secondary | ICD-10-CM | POA: Diagnosis present

## 2018-02-02 ENCOUNTER — Encounter: Payer: Self-pay | Admitting: *Deleted

## 2018-02-05 ENCOUNTER — Encounter
Admission: RE | Disposition: A | Discharge status: Home / Self Care | Payer: Self-pay | Source: Ambulatory Visit | Attending: Unknown Physician Specialty

## 2018-02-05 ENCOUNTER — Ambulatory Visit
Admission: RE | Admit: 2018-02-05 | Discharge: 2018-02-05 | Disposition: A | Discharge status: Home / Self Care | Payer: Medicare Other | Source: Ambulatory Visit | Attending: Unknown Physician Specialty | Admitting: Unknown Physician Specialty

## 2018-02-05 ENCOUNTER — Other Ambulatory Visit: Payer: Self-pay

## 2018-02-05 ENCOUNTER — Ambulatory Visit: Payer: Medicare Other | Admitting: Anesthesiology

## 2018-02-05 DIAGNOSIS — K3189 Other diseases of stomach and duodenum: Secondary | ICD-10-CM | POA: Insufficient documentation

## 2018-02-05 DIAGNOSIS — I8500 Esophageal varices without bleeding: Secondary | ICD-10-CM | POA: Diagnosis not present

## 2018-02-05 DIAGNOSIS — I2510 Atherosclerotic heart disease of native coronary artery without angina pectoris: Secondary | ICD-10-CM | POA: Insufficient documentation

## 2018-02-05 DIAGNOSIS — E119 Type 2 diabetes mellitus without complications: Secondary | ICD-10-CM | POA: Insufficient documentation

## 2018-02-05 DIAGNOSIS — Z8673 Personal history of transient ischemic attack (TIA), and cerebral infarction without residual deficits: Secondary | ICD-10-CM | POA: Diagnosis not present

## 2018-02-05 DIAGNOSIS — I509 Heart failure, unspecified: Secondary | ICD-10-CM | POA: Diagnosis not present

## 2018-02-05 DIAGNOSIS — E785 Hyperlipidemia, unspecified: Secondary | ICD-10-CM | POA: Diagnosis not present

## 2018-02-05 DIAGNOSIS — K7460 Unspecified cirrhosis of liver: Secondary | ICD-10-CM | POA: Insufficient documentation

## 2018-02-05 DIAGNOSIS — I4891 Unspecified atrial fibrillation: Secondary | ICD-10-CM | POA: Insufficient documentation

## 2018-02-05 DIAGNOSIS — K219 Gastro-esophageal reflux disease without esophagitis: Secondary | ICD-10-CM | POA: Insufficient documentation

## 2018-02-05 DIAGNOSIS — I110 Hypertensive heart disease with heart failure: Secondary | ICD-10-CM | POA: Insufficient documentation

## 2018-02-05 DIAGNOSIS — J45909 Unspecified asthma, uncomplicated: Secondary | ICD-10-CM | POA: Diagnosis not present

## 2018-02-05 DIAGNOSIS — G4730 Sleep apnea, unspecified: Secondary | ICD-10-CM | POA: Insufficient documentation

## 2018-02-05 DIAGNOSIS — K766 Portal hypertension: Secondary | ICD-10-CM | POA: Insufficient documentation

## 2018-02-05 DIAGNOSIS — Z7982 Long term (current) use of aspirin: Secondary | ICD-10-CM | POA: Diagnosis not present

## 2018-02-05 DIAGNOSIS — Z794 Long term (current) use of insulin: Secondary | ICD-10-CM | POA: Diagnosis not present

## 2018-02-05 DIAGNOSIS — Z79899 Other long term (current) drug therapy: Secondary | ICD-10-CM | POA: Diagnosis not present

## 2018-02-05 DIAGNOSIS — Z951 Presence of aortocoronary bypass graft: Secondary | ICD-10-CM | POA: Insufficient documentation

## 2018-02-05 HISTORY — DX: Cerebral infarction, unspecified: I63.9

## 2018-02-05 HISTORY — DX: Other giant cell arteritis: M31.6

## 2018-02-05 HISTORY — DX: Unspecified osteoarthritis, unspecified site: M19.90

## 2018-02-05 HISTORY — DX: Dyspnea, unspecified: R06.00

## 2018-02-05 HISTORY — DX: Pain in unspecified hip: M25.559

## 2018-02-05 HISTORY — DX: Bradycardia, unspecified: R00.1

## 2018-02-05 HISTORY — PX: ESOPHAGOGASTRODUODENOSCOPY (EGD) WITH PROPOFOL: SHX5813

## 2018-02-05 HISTORY — DX: Esophageal varices without bleeding: I85.00

## 2018-02-05 LAB — CBC
HEMATOCRIT: 33.7 % — AB (ref 36.0–46.0)
Hemoglobin: 11.3 g/dL — ABNORMAL LOW (ref 12.0–15.0)
MCH: 29.8 pg (ref 26.0–34.0)
MCHC: 33.5 g/dL (ref 30.0–36.0)
MCV: 88.9 fL (ref 80.0–100.0)
Platelets: 60 10*3/uL — ABNORMAL LOW (ref 150–400)
RBC: 3.79 MIL/uL — ABNORMAL LOW (ref 3.87–5.11)
RDW: 16.1 % — AB (ref 11.5–15.5)
WBC: 3.9 10*3/uL — AB (ref 4.0–10.5)
nRBC: 0 % (ref 0.0–0.2)

## 2018-02-05 LAB — GLUCOSE, CAPILLARY: Glucose-Capillary: 92 mg/dL (ref 70–99)

## 2018-02-05 SURGERY — ESOPHAGOGASTRODUODENOSCOPY (EGD) WITH PROPOFOL
Anesthesia: General

## 2018-02-05 MED ORDER — SODIUM CHLORIDE 0.9 % IV SOLN
INTRAVENOUS | Status: DC
  Administered 2018-02-05: 08:00:00 via INTRAVENOUS

## 2018-02-05 MED ORDER — PROPOFOL 500 MG/50ML IV EMUL
INTRAVENOUS | Status: AC
  Filled 2018-02-05: qty 50

## 2018-02-05 MED ORDER — BUTAMBEN-TETRACAINE-BENZOCAINE 2-2-14 % EX AERO
INHALATION_SPRAY | CUTANEOUS | Status: DC | PRN
  Administered 2018-02-05: 2 via TOPICAL

## 2018-02-05 MED ORDER — SODIUM CHLORIDE 0.9 % IV SOLN: INTRAVENOUS | Status: DC

## 2018-02-05 MED ORDER — PIPERACILLIN-TAZOBACTAM 3.375 G IVPB
3.3750 g | Freq: Once | INTRAVENOUS | Status: AC
  Administered 2018-02-05: 3.375 g via INTRAVENOUS

## 2018-02-05 MED ORDER — PIPERACILLIN-TAZOBACTAM 3.375 G IVPB
INTRAVENOUS | Status: AC
Start: 2018-02-05 — End: 2018-02-05
  Administered 2018-02-05: 3.375 g via INTRAVENOUS
  Filled 2018-02-05: qty 50

## 2018-02-05 MED ORDER — PROPOFOL 500 MG/50ML IV EMUL
INTRAVENOUS | Status: DC | PRN
  Administered 2018-02-05: 100 ug/kg/min via INTRAVENOUS

## 2018-02-05 NOTE — Transfer of Care (Signed)
Immediate Anesthesia Transfer of Care Note  Patient: Theresa [name]  Procedure(s) Performed: ESOPHAGOGASTRODUODENOSCOPY (EGD) WITH PROPOFOL (N/A )  Patient Location: PACU  Anesthesia Type:General  Level of Consciousness: awake and sedated  Airway & Oxygen Therapy: Patient Spontanous Breathing and Patient connected to nasal cannula oxygen  Post-op Assessment: Report given to RN and Post -op Vital signs reviewed and stable  Post vital signs: Reviewed and stable  Last Vitals:  Vitals Value Taken Time  BP    Temp    Pulse    Resp    SpO2      Last Pain:  Vitals:   02/05/18 0740  TempSrc: Tympanic  PainSc: 0-No pain         Complications: No apparent anesthesia complications

## 2018-02-05 NOTE — Anesthesia Preprocedure Evaluation (Addendum)
Anesthesia Evaluation  Patient identified by MRN, date of birth, ID band Patient awake    Reviewed: Allergy & Precautions, NPO status , Patient's Chart, lab work & pertinent test results  History of Anesthesia Complications Negative for: history of anesthetic complications  Airway Mallampati: III       Dental   Pulmonary asthma , sleep apnea (not using CPAP) ,           Cardiovascular hypertension, Pt. on medications + CAD, + CABG, +CHF and + Orthopnea  + dysrhythmias Atrial Fibrillation (-) Valvular Problems/Murmurs     Neuro/Psych neg Seizures CVA (R leg weakness), Residual Symptoms    GI/Hepatic GERD  Medicated and Controlled,(+) Cirrhosis       ,   Endo/Other  diabetes, Type 2, Oral Hypoglycemic AgentsHypothyroidism   Renal/GU negative Renal ROS     Musculoskeletal   Abdominal   Peds  Hematology  (+) Blood dyscrasia (chronic throbocytopenia), anemia ,   Anesthesia Other Findings   Reproductive/Obstetrics                            Anesthesia Physical Anesthesia Plan  ASA: III  Anesthesia Plan: General   Post-op Pain Management:    Induction:   PONV Risk Score and Plan: 3 and Propofol infusion and TIVA  Airway Management Planned: Nasal Cannula  Additional Equipment:   Intra-op Plan:   Post-operative Plan:   Informed Consent: I have reviewed the patients History and Physical, chart, labs and discussed the procedure including the risks, benefits and alternatives for the proposed anesthesia with the patient or authorized representative who has indicated his/her understanding and acceptance.     Plan Discussed with:   Anesthesia Plan Comments:         Anesthesia Quick Evaluation

## 2018-02-05 NOTE — OR Nursing (Signed)
Dr. Vira Agar informed of Platelets results of 60, OK to proceed.

## 2018-02-05 NOTE — OR Nursing (Signed)
Lab here to draw blood for CBC.

## 2018-02-05 NOTE — Anesthesia Procedure Notes (Signed)
Performed by: Cook-Martin, Rickelle Sylvestre Pre-anesthesia Checklist: Patient identified, Emergency Drugs available, Suction available and Patient being monitored Patient Re-evaluated:Patient Re-evaluated prior to induction Oxygen Delivery Method: Nasal cannula Preoxygenation: Pre-oxygenation with 100% oxygen Induction Type: IV induction Airway Equipment and Method: Bite block Placement Confirmation: CO2 detector and positive ETCO2       

## 2018-02-05 NOTE — H&P (Signed)
Primary Care Physician:  Tracie Harrier, MD Primary Gastroenterologist:  Dr. Vira Agar  Pre-Procedure History & Physical: HPI:  Theresa [name] is a 77 y.o. female is here for an endoscopy.   Past Medical History:  Diagnosis Date  . Anemia   . Arthritis   . Asthma   . Bradycardia   . CAD (coronary artery disease)    BIL.CAROTID ARTERY STENOSIS  . CHF (congestive heart failure) ([place])   . Chronic low back pain   . Cirrhosis of liver not due to alcohol ([place])   . Colon polyps   . Diabetes mellitus without complication ([place])   . Dyspnea   . Esophageal varices without bleeding ([place])   . GERD (gastroesophageal reflux disease)   . Hip pain   . Hyperlipidemia   . Hypertension   . IDA (iron deficiency anemia)   . 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 Sleep apnea   . SOB (shortness of breath)   . Stroke ([place])   . Temporal arteritis ([place])   . Thrombocytopenia ([place])   . Thrombocytopenia ([place])     Past Surgical History:  Procedure Laterality Date  . COLONOSCOPY  10/24/2013, 2002   hyperplastic polpys  . CORONARY ANGIOPLASTY    . CORONARY ARTERY BYPASS GRAFT  09/1998  . ESOPHAGOGASTRODUODENOSCOPY (EGD) WITH PROPOFOL N/A 08/26/2016   Procedure: ESOPHAGOGASTRODUODENOSCOPY (EGD) WITH PROPOFOL;  Surgeon: Manya Silvas, MD;  Location: [facility] ENDOSCOPY;  Service: Endoscopy;  Laterality: N/A;  . ESOPHAGOGASTRODUODENOSCOPY ENDOSCOPY  05/24/2011  . hemorroid surgery    . JOINT REPLACEMENT  2003  2005   bil.knees    Prior to Admission medications   Medication Sig Start Date End Date Taking? Authorizing Provider  aspirin EC 81 MG tablet Take 81 mg by mouth daily.   Yes [provider]  Calcium Carbonate-Vitamin D (CALCIUM-VITAMIN D) 500-200 MG-UNIT tablet Take 1 tablet by mouth 2 (two) times daily.    Yes [provider]  ferrous sulfate 325 (65 FE) MG tablet Take 1 tablet by mouth daily.   Yes [provider]  folic acid (FOLVITE) 782  MCG tablet Take 400 mcg by mouth daily.    Yes [provider]  gabapentin (NEURONTIN) 100 MG capsule Take 200 mg by mouth at bedtime.  01/26/15  Yes [provider]  insulin detemir (LEVEMIR) 100 unit/ml SOLN Inject 0.2 mLs (20 Units total) into the skin at bedtime. Patient taking differently: Inject 10 Units into the skin at bedtime.  04/24/16  Yes Gladstone Lighter, MD  Insulin Lispro (HUMALOG KWIKPEN [place]) Inject into the skin 3 (three) times daily before meals.   Yes [provider]  levothyroxine (SYNTHROID, LEVOTHROID) 50 MCG tablet TAKE 1 TABLET DAILY 03/31/15  Yes [provider]  lisinopril (PRINIVIL,ZESTRIL) 10 MG tablet Take 10 mg by mouth daily.    Yes [provider]  meclizine (ANTIVERT) 12.5 MG tablet Take 12.5 mg by mouth 3 (three) times daily as needed for dizziness.   Yes [provider]  Melatonin 3 MG TABS Take 3 mg by mouth at bedtime.    Yes [provider]  Milk Thistle 250 MG CAPS Take 3 capsules by mouth daily.   Yes [provider]  Multiple Vitamin (MULTI-VITAMINS) TABS Take 1 tablet by mouth daily.    Yes [provider]  omeprazole (PRILOSEC) 20 MG capsule TAKE 1 CAPSULE DAILY 03/24/15  Yes [provider]  potassium chloride (K-DUR) 10 MEQ tablet Take 1 tablet by  mouth daily. 11/07/16  Yes [provider]  rifaximin (XIFAXAN) 550 MG TABS tablet Take 550 mg by mouth 2 (two) times daily.  01/20/15  Yes [provider]  simvastatin (ZOCOR) 20 MG tablet TAKE 1 TABLET EVERY EVENING FOR CHOLESTEROL 03/24/15  Yes [provider]  vitamin C (ASCORBIC ACID) 500 MG tablet Take 500 mg by mouth daily.    Yes [provider]  zinc gluconate 50 MG tablet Take 50 mg by mouth daily.   Yes [provider]  blood glucose meter kit and supplies KIT Dispense based on patient and insurance preference. Use up to four times daily as directed. (FOR ICD-9 250.00,  250.01). 04/24/16   Gladstone Lighter, MD  potassium chloride SA (K-DUR,KLOR-CON) 20 MEQ tablet Take 1 tablet (20 mEq total) by mouth 2 (two) times daily. 11/22/16   Cammie Sickle, MD  torsemide (DEMADEX) 20 MG tablet Take 1 tablet by mouth 2 (two) times daily. 11/22/16 11/22/17  [provider]    Allergies as of 01/02/2018 - Review Complete 10/13/2016  Allergen Reaction Noted  . Latex Other (See Comments) 03/31/2015  . Morphine and related Nausea And Vomiting 11/14/2014  . Adhesive [tape] Rash 11/14/2014  . Other Rash 03/31/2015    Family History  Problem Relation Age of Onset  . Heart disease Unknown   . Hypertension Unknown   . Anemia Unknown   . Colon cancer Unknown   . CVA Mother   . Dementia Mother   . CAD Father     Social History   Socioeconomic History  . Marital status: Married    Spouse name: Not on file  . Number of children: Not on file  . Years of education: Not on file  . Highest education level: Not on file  Occupational History  . Not on file  Social Needs  . Financial resource strain: Not on file  . Food insecurity:    Worry: Not on file    Inability: Not on file  . Transportation needs:    Medical: Not on file    Non-medical: Not on file  Tobacco Use  . Smoking status: Never Smoker  . Smokeless tobacco: Never Used  Substance and Sexual Activity  . Alcohol use: No    Alcohol/week: 0.0 standard drinks  . Drug use: No  . Sexual activity: Not on file  Lifestyle  . Physical activity:    Days per week: Not on file    Minutes per session: Not on file  . Stress: Not on file  Relationships  . Social connections:    Talks on phone: Not on file    Gets together: Not on file    Attends religious service: Not on file    Active member of club or organization: Not on file    Attends meetings of clubs or organizations: Not on file    Relationship status: Not on file  . Intimate partner violence:    Fear of current or ex partner: Not on  file    Emotionally abused: Not on file    Physically abused: Not on file    Forced sexual activity: Not on file  Other Topics Concern  . Not on file  Social History Narrative  . Not on file    Review of Systems: See HPI, otherwise negative ROS  Physical Exam: BP (!) 143/67   Pulse 71   Temp (!) 96.4 F (35.8 C) (Tympanic)   Resp 18   Ht 5' (1.524  m)   Wt 80.3 kg   SpO2 98%   BMI 34.57 kg/m  General:   Alert,  pleasant and cooperative in NAD Head:  Normocephalic and atraumatic. Neck:  Supple; no masses or thyromegaly. Lungs:  Clear throughout to auscultation.    Heart:  Regular rate and rhythm. Abdomen:  Soft, nontender and nondistended. Normal bowel sounds, without guarding, and without rebound.   Neurologic:  Alert and  oriented x4;  grossly normal neurologically.  Impression/Plan: NORI WINEGAR is here for an endoscopy to be performed for evaluation of esophageal varices.  Also repeat evaluation of portal hypertensive gastrophy.  Risks, benefits, limitations, and alternatives regarding  endoscopy have been reviewed with the patient.  Questions have been answered.  All parties agreeable.   Gaylyn Cheers, MD  02/05/2018, 9:14 AM

## 2018-02-05 NOTE — Op Note (Signed)
[facility] Gastroenterology Patient Name: Theresa [name] Procedure Date: 02/05/2018 9:14 AM MRN: [id number] Account #: [id number] Date of Birth: 17-Nov-1940 Admit Type: Outpatient Age: 77 Room: [facility] ENDO ROOM 1 Gender: Female Note Status: Finalized Procedure:            Upper GI endoscopy Indications:          Follow-up of portal hypertensive gastropathy, Follow-up                        of esophageal varices Providers:            Manya Silvas, MD Referring MD:         Tracie Harrier, MD (Referring MD) Medicines:            Propofol per Anesthesia Complications:        No immediate complications. Procedure:            Pre-Anesthesia Assessment:                       - After reviewing the risks and benefits, the patient                        was deemed in satisfactory condition to undergo the                        procedure.                       After obtaining informed consent, the endoscope was                        passed under direct vision. Throughout the procedure,                        the patient's blood pressure, pulse, and oxygen                        saturations were monitored continuously. The Endoscope                        was introduced through the mouth, and advanced to the                        duodenal bulb. The upper GI endoscopy was accomplished                        without difficulty. The patient tolerated the procedure                        well. Findings:      Grade 2-3 varices were found in the lower third of the esophagus. No       signs of recent bleeding.      Severe portal hypertensive gastropathy was found in the gastric body.      The examined duodenum was normal. Impression:           - Grade III esophageal varices.                       - Portal hypertensive gastropathy.                       -  Normal examined duodenum.                       - No specimens collected. Recommendation:       - The findings and  recommendations were discussed with                        the patient's family. Manya Silvas, MD 02/05/2018 9:33:09 AM This report has been signed electronically. Number of Addenda: 0 Note Initiated On: 02/05/2018 9:14 AM      [facility]

## 2018-02-05 NOTE — Care Plan (Signed)
Patient s/p endoscopy - MD into see patient  Monitor avss - Heart Rate Irregular at this time - Stat EKG ordered resp even and unlabored patient denies SOB/CP at this time

## 2018-02-05 NOTE — Anesthesia Postprocedure Evaluation (Signed)
Anesthesia Post Note  Patient: Theresa [name]  Procedure(s) Performed: ESOPHAGOGASTRODUODENOSCOPY (EGD) WITH PROPOFOL (N/A )  Patient location during evaluation: Endoscopy Anesthesia Type: General Level of consciousness: awake and alert Pain management: pain level controlled Vital Signs Assessment: post-procedure vital signs reviewed and stable Respiratory status: spontaneous breathing and respiratory function stable Cardiovascular status: stable Anesthetic complications: no     Last Vitals:  Vitals:   02/05/18 1000 02/05/18 1002  BP:  (!) 95/57  Pulse: (!) 49 (!) 46  Resp: (!) 25 (!) 24  Temp:    SpO2: 100% 98%    Last Pain:  Vitals:   02/05/18 0740  TempSrc: Tympanic  PainSc: 0-No pain                 , K

## 2018-02-05 NOTE — Anesthesia Post-op Follow-up Note (Signed)
Anesthesia QCDR form completed.        

## 2018-02-06 ENCOUNTER — Encounter: Payer: Self-pay | Admitting: Unknown Physician Specialty

## 2018-04-24 ENCOUNTER — Other Ambulatory Visit: Payer: Self-pay | Admitting: Internal Medicine

## 2018-04-24 DIAGNOSIS — Z1231 Encounter for screening mammogram for malignant neoplasm of breast: Secondary | ICD-10-CM

## 2018-06-12 IMAGING — CR DG CHEST 2V
2 series · 2 of 2 positions shown · non-contrast
Comparison: 04/22/2016 and prior radiographs

CLINICAL DATA: Shortness of breath and congestion for 1 week.

EXAM:
CHEST  2 VIEW

[chest pa]
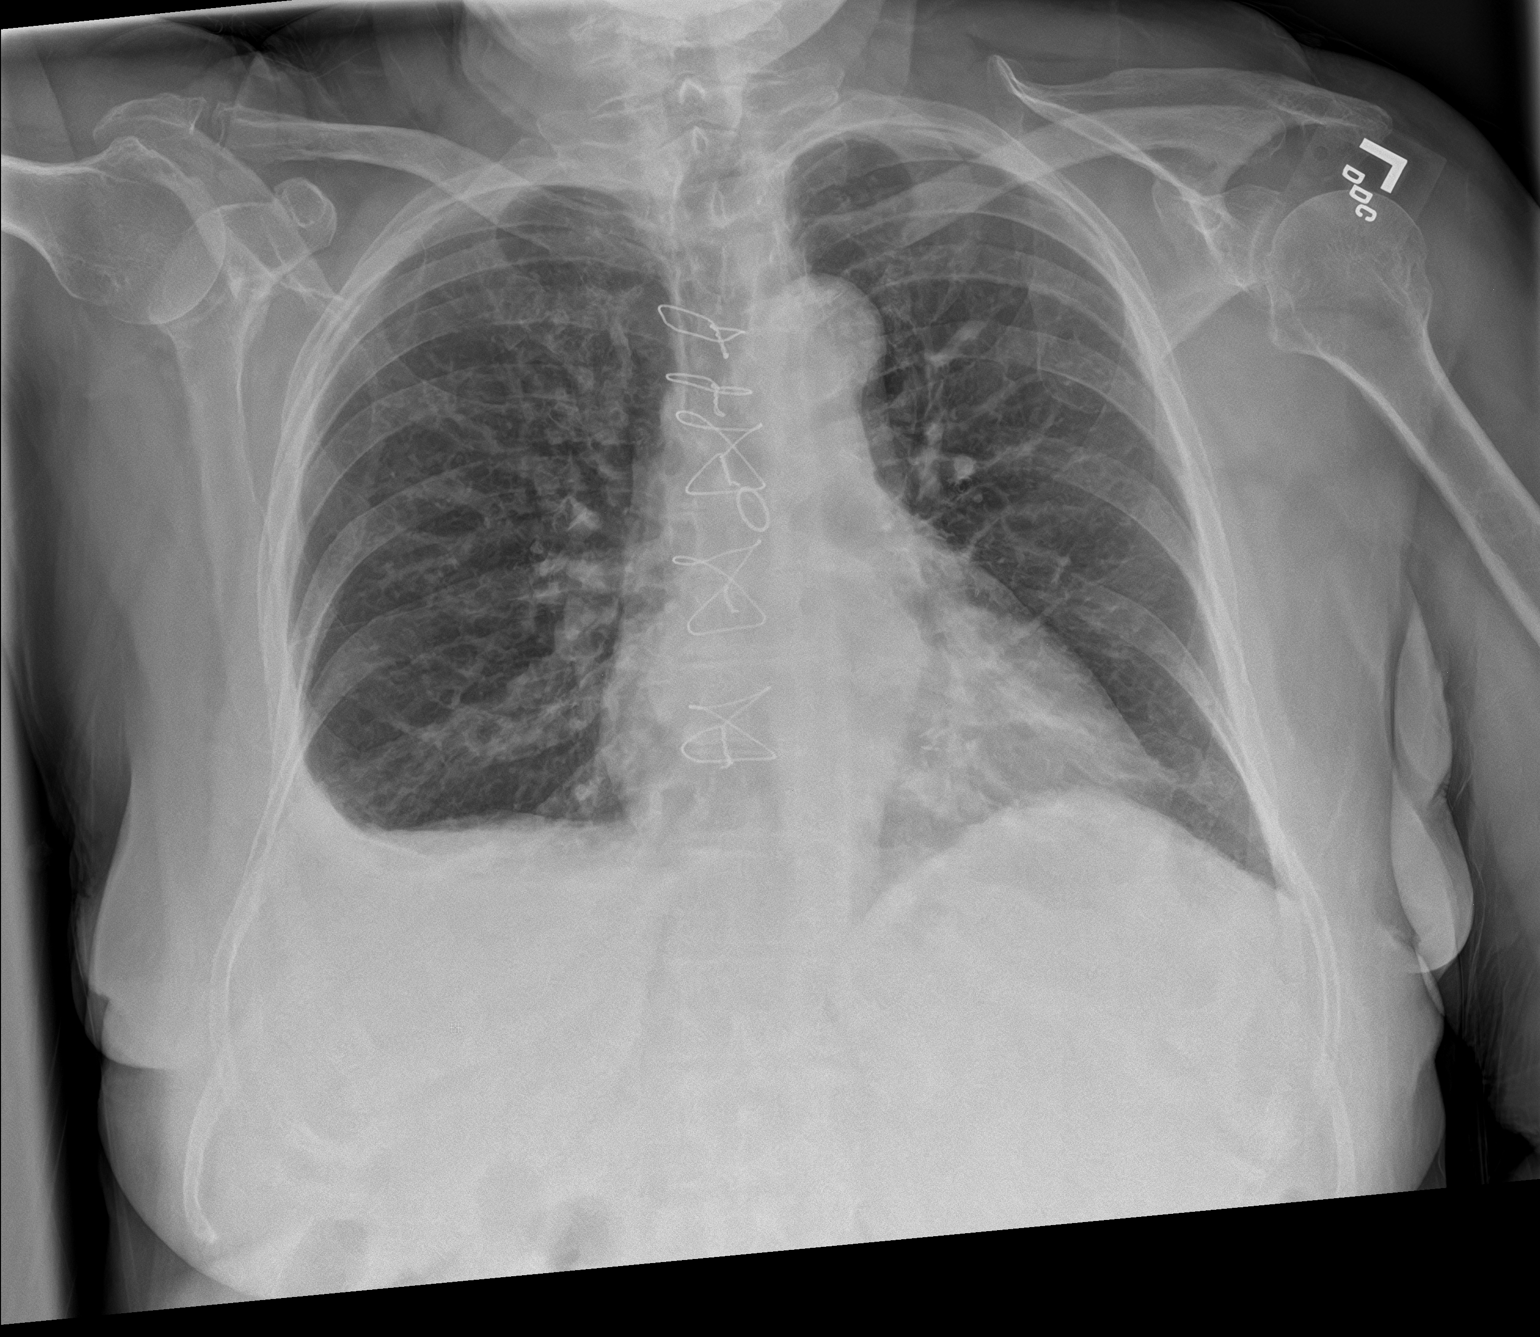

[chest lat]
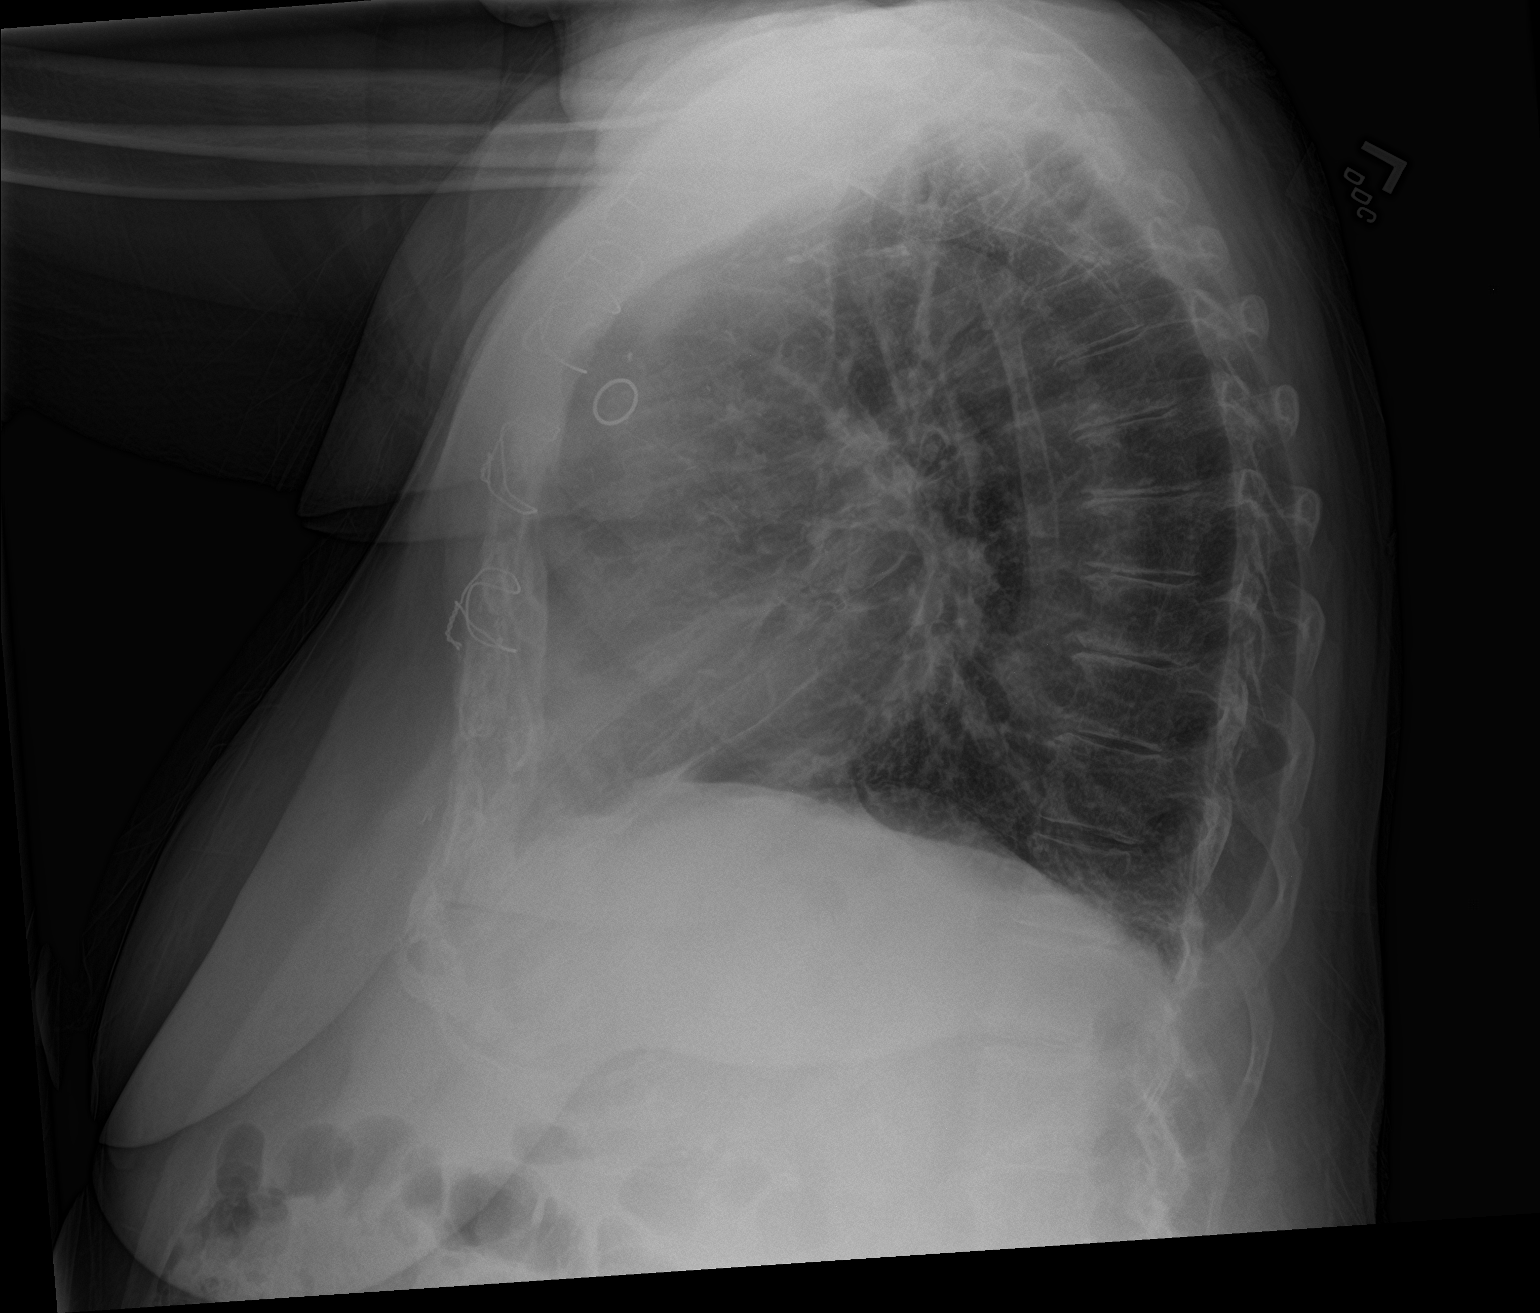

[2 of 2 positions shown; findings below may reference images not displayed]

FINDINGS: Cardiomegaly and CABG changes noted.

Pulmonary vascular congestion identified.

A small right pleural effusion is present.

No evidence of airspace disease or pneumothorax.

No acute bony abnormalities are identified.
IMPRESSION: 1. Pulmonary vascular congestion and small right pleural effusion
2. Cardiomegaly

## 2018-06-18 ENCOUNTER — Other Ambulatory Visit: Payer: Self-pay | Admitting: Nurse Practitioner

## 2018-06-18 DIAGNOSIS — K7460 Unspecified cirrhosis of liver: Secondary | ICD-10-CM

## 2018-08-27 ENCOUNTER — Ambulatory Visit: Payer: Medicare Other

## 2018-08-28 ENCOUNTER — Ambulatory Visit
Admission: RE | Admit: 2018-08-28 | Discharge: 2018-08-28 | Disposition: A | Discharge status: Home / Self Care | Payer: Medicare Other | Source: Ambulatory Visit | Attending: Nurse Practitioner | Admitting: Nurse Practitioner

## 2018-08-28 ENCOUNTER — Ambulatory Visit: Admission: RE | Admit: 2018-08-28 | Payer: Medicare Other | Source: Ambulatory Visit

## 2018-08-28 ENCOUNTER — Other Ambulatory Visit: Payer: Self-pay

## 2018-08-28 DIAGNOSIS — K7460 Unspecified cirrhosis of liver: Secondary | ICD-10-CM | POA: Diagnosis not present

## 2019-05-07 ENCOUNTER — Other Ambulatory Visit: Payer: Self-pay | Admitting: Internal Medicine

## 2019-05-07 DIAGNOSIS — Z1231 Encounter for screening mammogram for malignant neoplasm of breast: Secondary | ICD-10-CM

## 2019-06-10 DIAGNOSIS — I480 Paroxysmal atrial fibrillation: Secondary | ICD-10-CM | POA: Diagnosis present

## 2019-06-17 ENCOUNTER — Ambulatory Visit
Admission: RE | Admit: 2019-06-17 | Discharge: 2019-06-17 | Disposition: A | Discharge status: Home / Self Care | Payer: Medicare Other | Source: Ambulatory Visit | Attending: Internal Medicine | Admitting: Internal Medicine

## 2019-06-17 DIAGNOSIS — Z1231 Encounter for screening mammogram for malignant neoplasm of breast: Secondary | ICD-10-CM | POA: Diagnosis not present

## 2019-07-18 ENCOUNTER — Other Ambulatory Visit: Payer: Self-pay

## 2019-07-18 ENCOUNTER — Other Ambulatory Visit
Admission: RE | Admit: 2019-07-18 | Discharge: 2019-07-18 | Disposition: A | Discharge status: Home / Self Care | Payer: Medicare Other | Source: Ambulatory Visit | Attending: Internal Medicine | Admitting: Internal Medicine

## 2019-07-18 DIAGNOSIS — Z01812 Encounter for preprocedural laboratory examination: Secondary | ICD-10-CM | POA: Diagnosis present

## 2019-07-18 DIAGNOSIS — Z20822 Contact with and (suspected) exposure to covid-19: Secondary | ICD-10-CM | POA: Diagnosis not present

## 2019-07-18 LAB — SARS CORONAVIRUS 2 (TAT 6-24 HRS): SARS Coronavirus 2: NEGATIVE

## 2019-07-22 ENCOUNTER — Encounter
Admission: RE | Disposition: A | Discharge status: Home / Self Care | Payer: Self-pay | Source: Home / Self Care | Attending: Internal Medicine

## 2019-07-22 ENCOUNTER — Encounter: Payer: Self-pay | Admitting: Internal Medicine

## 2019-07-22 ENCOUNTER — Other Ambulatory Visit: Payer: Self-pay

## 2019-07-22 ENCOUNTER — Ambulatory Visit: Payer: Medicare Other | Admitting: Anesthesiology

## 2019-07-22 ENCOUNTER — Ambulatory Visit
Admission: RE | Admit: 2019-07-22 | Discharge: 2019-07-22 | Disposition: A | Discharge status: Home / Self Care | Payer: Medicare Other | Attending: Internal Medicine | Admitting: Internal Medicine

## 2019-07-22 DIAGNOSIS — I509 Heart failure, unspecified: Secondary | ICD-10-CM | POA: Diagnosis not present

## 2019-07-22 DIAGNOSIS — Z7982 Long term (current) use of aspirin: Secondary | ICD-10-CM | POA: Insufficient documentation

## 2019-07-22 DIAGNOSIS — E785 Hyperlipidemia, unspecified: Secondary | ICD-10-CM | POA: Insufficient documentation

## 2019-07-22 DIAGNOSIS — Z96653 Presence of artificial knee joint, bilateral: Secondary | ICD-10-CM | POA: Diagnosis not present

## 2019-07-22 DIAGNOSIS — K7460 Unspecified cirrhosis of liver: Secondary | ICD-10-CM | POA: Diagnosis not present

## 2019-07-22 DIAGNOSIS — D649 Anemia, unspecified: Secondary | ICD-10-CM | POA: Diagnosis not present

## 2019-07-22 DIAGNOSIS — Z79899 Other long term (current) drug therapy: Secondary | ICD-10-CM | POA: Insufficient documentation

## 2019-07-22 DIAGNOSIS — Z951 Presence of aortocoronary bypass graft: Secondary | ICD-10-CM | POA: Insufficient documentation

## 2019-07-22 DIAGNOSIS — I8510 Secondary esophageal varices without bleeding: Secondary | ICD-10-CM | POA: Diagnosis not present

## 2019-07-22 DIAGNOSIS — Z79890 Hormone replacement therapy (postmenopausal): Secondary | ICD-10-CM | POA: Diagnosis not present

## 2019-07-22 DIAGNOSIS — I2510 Atherosclerotic heart disease of native coronary artery without angina pectoris: Secondary | ICD-10-CM | POA: Diagnosis not present

## 2019-07-22 DIAGNOSIS — K766 Portal hypertension: Secondary | ICD-10-CM | POA: Insufficient documentation

## 2019-07-22 DIAGNOSIS — G4730 Sleep apnea, unspecified: Secondary | ICD-10-CM | POA: Diagnosis not present

## 2019-07-22 DIAGNOSIS — Z8673 Personal history of transient ischemic attack (TIA), and cerebral infarction without residual deficits: Secondary | ICD-10-CM | POA: Diagnosis not present

## 2019-07-22 DIAGNOSIS — I110 Hypertensive heart disease with heart failure: Secondary | ICD-10-CM | POA: Diagnosis not present

## 2019-07-22 DIAGNOSIS — K3189 Other diseases of stomach and duodenum: Secondary | ICD-10-CM | POA: Diagnosis not present

## 2019-07-22 DIAGNOSIS — K219 Gastro-esophageal reflux disease without esophagitis: Secondary | ICD-10-CM | POA: Insufficient documentation

## 2019-07-22 DIAGNOSIS — I6523 Occlusion and stenosis of bilateral carotid arteries: Secondary | ICD-10-CM | POA: Insufficient documentation

## 2019-07-22 DIAGNOSIS — E119 Type 2 diabetes mellitus without complications: Secondary | ICD-10-CM | POA: Diagnosis not present

## 2019-07-22 DIAGNOSIS — Z794 Long term (current) use of insulin: Secondary | ICD-10-CM | POA: Diagnosis not present

## 2019-07-22 HISTORY — PX: ESOPHAGOGASTRODUODENOSCOPY (EGD) WITH PROPOFOL: SHX5813

## 2019-07-22 LAB — GLUCOSE, CAPILLARY: Glucose-Capillary: 87 mg/dL (ref 70–99)

## 2019-07-22 SURGERY — ESOPHAGOGASTRODUODENOSCOPY (EGD) WITH PROPOFOL
Anesthesia: General

## 2019-07-22 MED ORDER — SODIUM CHLORIDE 0.9 % IV SOLN: INTRAVENOUS | Status: DC | PRN

## 2019-07-22 MED ORDER — PROPOFOL 500 MG/50ML IV EMUL
INTRAVENOUS | Status: DC | PRN
  Administered 2019-07-22: 100 ug/kg/min via INTRAVENOUS

## 2019-07-22 MED ORDER — PROPOFOL 10 MG/ML IV BOLUS
INTRAVENOUS | Status: DC | PRN
  Administered 2019-07-22 (×2): 50 mg via INTRAVENOUS

## 2019-07-22 MED ORDER — LIDOCAINE 2% (20 MG/ML) 5 ML SYRINGE
INTRAMUSCULAR | Status: DC | PRN
  Administered 2019-07-22: 25 mg via INTRAVENOUS

## 2019-07-22 NOTE — Op Note (Signed)
[facility] Gastroenterology Patient Name: Theresa [name] Procedure Date: 07/22/2019 9:37 AM MRN: [id number] Account #: [id number] Date of Birth: 06/10/1940 Admit Type: Outpatient Age: 79 Room: [facility] ENDO ROOM 3 Gender: Female Note Status: Finalized Procedure:             Upper GI endoscopy Indications:           For therapy of esophageal varices with bleeding Providers:             Benay Pike. Alice Reichert MD, MD Referring MD:          Tracie Harrier, MD (Referring MD) Medicines:             Propofol per Anesthesia Complications:         No immediate complications. Estimated blood loss: None. Procedure:             Pre-Anesthesia Assessment:                        - The risks and benefits of the procedure and the                         sedation options and risks were discussed with the                         patient. All questions were answered and informed                         consent was obtained.                        - Patient identification and proposed procedure were                         verified prior to the procedure by the nurse. The                         procedure was verified in the procedure room.                        - ASA Grade Assessment: III - A patient with severe                         systemic disease.                        - After reviewing the risks and benefits, the patient                         was deemed in satisfactory condition to undergo the                         procedure.                        After obtaining informed consent, the endoscope was                         passed under direct vision. Throughout the procedure,  the patient's blood pressure, pulse, and oxygen                         saturations were monitored continuously. The Endoscope                         was introduced through the mouth, and advanced to the                         third part of duodenum. The upper GI endoscopy was                      accomplished without difficulty. The patient tolerated                         the procedure well. Findings:      Grade II varices were found in the lower third of the esophagus. They       were 5 mm in largest diameter. Two bands were successfully placed with       complete eradication, resulting in deflation of varices. There was no       bleeding during and at the end of the procedure. Estimated blood loss:       none.      Moderate portal hypertensive gastropathy was found in the entire       examined stomach.      The examined duodenum was normal.      The exam was otherwise without abnormality. Impression:            - Grade II esophageal varices. Completely eradicated.                         Banded.                        - Portal hypertensive gastropathy.                        - Normal examined duodenum.                        - The examination was otherwise normal.                        - No specimens collected. Recommendation:        - Patient has a contact number available for                         emergencies. The signs and symptoms of potential                         delayed complications were discussed with the patient.                         Return to normal activities tomorrow. Written                         discharge instructions were provided to the patient.                        - Resume previous diet.                        -  Continue present medications.                        - Repeat upper endoscopy in 6 months for retreatment.                        - The findings and recommendations were discussed with                         the patient.                        - Return to physician assistant in 3 months.                        - The findings and recommendations were discussed with                         the patient. Procedure Code(s):     --- Professional ---                        [id number], Esophagogastroduodenoscopy, flexible,                          transoral; with band ligation of esophageal/gastric                         varices Diagnosis Code(s):     --- Professional ---                        I85.01, Esophageal varices with bleeding                        K31.89, Other diseases of stomach and duodenum                        K76.6, Portal hypertension CPT copyright 2019 American Medical Association. All rights reserved. The codes documented in this report are preliminary and upon coder review may  be revised to meet current compliance requirements. Efrain Sella MD, MD 07/22/2019 10:05:10 AM This report has been signed electronically. Number of Addenda: 0 Note Initiated On: 07/22/2019 9:37 AM Estimated Blood Loss:  Estimated blood loss: none.      [facility]

## 2019-07-22 NOTE — Anesthesia Preprocedure Evaluation (Addendum)
Anesthesia Evaluation  Patient identified by MRN, date of birth, ID band Patient awake    Reviewed: Allergy & Precautions, H&P , NPO status , Patient's Chart, lab work & pertinent test results  Airway Mallampati: II  TM Distance: >3 FB Neck ROM: full    Dental  (+) Teeth Intact, Caps   Pulmonary shortness of breath, asthma , sleep apnea (does not use CPAP) ,  Previously used home O2, says she was told she no longer needs it          Cardiovascular Exercise Tolerance: Poor hypertension, + CAD, + CABG and +CHF  + dysrhythmias Atrial Fibrillation  Rhythm:regular Rate:Normal     Neuro/Psych CVA negative psych ROS   GI/Hepatic GERD  ,(+) Cirrhosis   Esophageal Varices    ,   Endo/Other  diabetes  Renal/GU negative Renal ROS  negative genitourinary   Musculoskeletal   Abdominal   Peds  Hematology  (+) Blood dyscrasia, anemia , thrombocytopenia   Anesthesia Other Findings Past Medical History: No date: Anemia No date: Arthritis No date: Asthma No date: Bradycardia No date: CAD (coronary artery disease)     Comment:  BIL.CAROTID ARTERY STENOSIS No date: CHF (congestive heart failure) (HCC) No date: Chronic low back pain No date: Cirrhosis of liver not due to alcohol (HCC) No date: Colon polyps No date: Diabetes mellitus without complication (HCC) No date: Dyspnea No date: Esophageal varices without bleeding (HCC) No date: GERD (gastroesophageal reflux disease) No date: Hip pain No date: Hyperlipidemia No date: Hypertension No date: IDA (iron deficiency anemia) 10/2010: MGUS (monoclonal gammopathy of unknown significance) No date: Sleep apnea No date: SOB (shortness of breath) No date: Stroke [facility]) No date: Temporal arteritis ([place]) No date: Thrombocytopenia ([place]) No date: Thrombocytopenia ([place])  Past Surgical History: 10/24/2013, 2002: COLONOSCOPY     Comment:  hyperplastic polpys No date: CORONARY  ANGIOPLASTY 09/1998: CORONARY ARTERY BYPASS GRAFT 08/26/2016: ESOPHAGOGASTRODUODENOSCOPY (EGD) WITH PROPOFOL; N/A     Comment:  Procedure: ESOPHAGOGASTRODUODENOSCOPY (EGD) WITH               PROPOFOL;  Surgeon: Manya Silvas, MD;  Location:               [facility] ENDOSCOPY;  Service: Endoscopy;  Laterality: N/A; 02/05/2018: ESOPHAGOGASTRODUODENOSCOPY (EGD) WITH PROPOFOL; N/A     Comment:  Procedure: ESOPHAGOGASTRODUODENOSCOPY (EGD) WITH               PROPOFOL;  Surgeon: Manya Silvas, MD;  Location:               [facility] ENDOSCOPY;  Service: Endoscopy;  Laterality: N/A; 05/24/2011: ESOPHAGOGASTRODUODENOSCOPY ENDOSCOPY No date: hemorroid surgery 2003  2005: JOINT REPLACEMENT     Comment:  [email or web address]     Reproductive/Obstetrics negative OB ROS                            Anesthesia Physical Anesthesia Plan  ASA: III  Anesthesia Plan: General   Post-op Pain Management:    Induction:   PONV Risk Score and Plan: Propofol infusion and TIVA  Airway Management Planned: Natural Airway and Nasal Cannula  Additional Equipment:   Intra-op Plan:   Post-operative Plan:   Informed Consent: I have reviewed the patients History and Physical, chart, labs and discussed the procedure including the risks, benefits and alternatives for the proposed anesthesia with the patient or authorized representative who has indicated his/her understanding and acceptance.     Dental  Advisory Given  Plan Discussed with: Anesthesiologist  Anesthesia Plan Comments:        Anesthesia Quick Evaluation

## 2019-07-22 NOTE — Anesthesia Postprocedure Evaluation (Signed)
Anesthesia Post Note  Patient: Theresa [name]  Procedure(s) Performed: ESOPHAGOGASTRODUODENOSCOPY (EGD) WITH PROPOFOL (N/A )  Patient location during evaluation: PACU Anesthesia Type: General Level of consciousness: awake and alert Pain management: pain level controlled Vital Signs Assessment: post-procedure vital signs reviewed and stable Respiratory status: spontaneous breathing, nonlabored ventilation and respiratory function stable Cardiovascular status: blood pressure returned to baseline and stable Postop Assessment: no apparent nausea or vomiting Anesthetic complications: no     Last Vitals:  Vitals:   07/22/19 1008 07/22/19 1036  BP: (!) 145/76 (!) 154/76  Pulse: 80   Resp: 12   Temp: (!) 36.2 C   SpO2: 95%     Last Pain:  Vitals:   07/22/19 1036  TempSrc:   PainSc: 0-No pain                 Tera Mater

## 2019-07-22 NOTE — Interval H&P Note (Signed)
History and Physical Interval Note:  07/22/2019 9:16 AM  Theresa [name]  has presented today for surgery, with the diagnosis of Cirrhosis.  The various methods of treatment have been discussed with the patient and family. After consideration of risks, benefits and other options for treatment, the patient has consented to  Procedure(s): ESOPHAGOGASTRODUODENOSCOPY (EGD) WITH PROPOFOL (N/A) as a surgical intervention.  The patient's history has been reviewed, patient examined, no change in status, stable for surgery.  I have reviewed the patient's chart and labs.  Questions were answered to the patient's satisfaction.     [place], [place]

## 2019-07-22 NOTE — Transfer of Care (Signed)
Immediate Anesthesia Transfer of Care Note  Patient: EDMUND RICK  Procedure(s) Performed: ESOPHAGOGASTRODUODENOSCOPY (EGD) WITH PROPOFOL (N/A )  Patient Location: Endoscopy Unit  Anesthesia Type:General  Level of Consciousness: sedated  Airway & Oxygen Therapy: Patient Spontanous Breathing and Patient connected to nasal cannula oxygen  Post-op Assessment: Report given to RN and Post -op Vital signs reviewed and stable  Post vital signs: Reviewed  Last Vitals:  Vitals Value Taken Time  BP 145/76 07/22/19 1008  Temp 36.2 C 07/22/19 1008  Pulse 77 07/22/19 1008  Resp 24 07/22/19 1008  SpO2 98 % 07/22/19 1008  Vitals shown include unvalidated device data.  Last Pain:  Vitals:   07/22/19 1006  TempSrc: Temporal  PainSc:          Complications: No apparent anesthesia complications

## 2019-07-22 NOTE — H&P (Signed)
Outpatient short stay form Pre-procedure 07/22/2019 9:14 AM Olufemi Mofield K. Alice Reichert, M.D.  Primary Physician: Tracie Harrier, M.D.  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  History of present illness: 79 year old female with history of esophageal varices presents today for secondary surveillance of esophageal varices.  Patient was tried on nonselective beta-blocker last year but was unable to tolerate secondary to sinus bradycardia at a low dose.  She denies any hematemesis, melena, recent need for transfusion.  No dysphagia.  No weight loss.   No current facility-administered medications for this encounter.  Medications Prior to Admission  Medication Sig Dispense Refill Last Dose  . aspirin EC 81 MG tablet Take 81 mg by mouth daily.   07/21/2019 at [place]  . Calcium Carbonate-Vitamin D (CALCIUM-VITAMIN D) 500-200 MG-UNIT tablet Take 1 tablet by mouth 2 (two) times daily.    07/21/2019 at Unknown time  . levothyroxine (SYNTHROID, LEVOTHROID) 50 MCG tablet TAKE 1 TABLET DAILY   07/22/2019 at 0430  . lisinopril (PRINIVIL,ZESTRIL) 10 MG tablet Take 10 mg by mouth daily.    07/22/2019 at 0430  . Multiple Vitamin (MULTI-VITAMINS) TABS Take 1 tablet by mouth daily.    07/21/2019 at Unknown time  . potassium chloride (K-DUR) 10 MEQ tablet Take 1 tablet by mouth daily.   07/21/2019 at Unknown time  . zinc gluconate 50 MG tablet Take 50 mg by mouth daily.   07/21/2019 at Unknown time  . blood glucose meter kit and supplies KIT Dispense based on patient and insurance preference. Use up to four times daily as directed. (FOR ICD-9 250.00, 250.01). 1 each 0   . ferrous sulfate 325 (65 FE) MG tablet Take 1 tablet by mouth daily.     . folic acid (FOLVITE) 401 MCG tablet Take 400 mcg by mouth daily.      Marland Kitchen gabapentin (NEURONTIN) 100 MG capsule Take 200 mg by mouth at bedtime.      . insulin detemir (LEVEMIR) 100 unit/ml SOLN Inject 0.2  mLs (20 Units total) into the skin at bedtime. (Patient taking differently: Inject 10 Units into the skin at bedtime. ) 100 mL 2   . Insulin Lispro (HUMALOG KWIKPEN [place]) Inject into the skin 3 (three) times daily before meals.     . meclizine (ANTIVERT) 12.5 MG tablet Take 12.5 mg by mouth 3 (three) times daily as needed for dizziness.     . Melatonin 3 MG TABS Take 3 mg by mouth at bedtime.      . Milk Thistle 250 MG CAPS Take 3 capsules by mouth daily.     Marland Kitchen omeprazole (PRILOSEC) 20 MG capsule TAKE 1 CAPSULE DAILY     . potassium chloride SA (K-DUR,KLOR-CON) 20 MEQ tablet Take 1 tablet (20 mEq total) by mouth 2 (two) times daily. 14 tablet 0   . rifaximin (XIFAXAN) 550 MG TABS tablet Take 550 mg by mouth 2 (two) times daily.      . simvastatin (ZOCOR) 20 MG tablet TAKE 1 TABLET EVERY EVENING FOR CHOLESTEROL     . torsemide (DEMADEX) 20 MG tablet Take 1 tablet by mouth 2 (two) times daily.     . vitamin C (ASCORBIC ACID) 500 MG tablet Take 500 mg by mouth daily.         Allergies  Allergen Reactions  . Morphine And Related Nausea And Vomiting    Other reaction(s): Nausea And Vomiting  . Adhesive [Tape] Rash  . Other Rash  NEOPRENE, TAPE, BAND-AID TOUGH STRIPS     Past Medical History:  Diagnosis Date  . Anemia   . Arthritis   . Asthma   . Bradycardia   . CAD (coronary artery disease)    BIL.CAROTID ARTERY STENOSIS  . CHF (congestive heart failure) ([place])   . Chronic low back pain   . Cirrhosis of liver not due to alcohol ([place])   . Colon polyps   . Diabetes mellitus without complication ([place])   . Dyspnea   . Esophageal varices without bleeding ([place])   . GERD (gastroesophageal reflux disease)   . Hip pain   . Hyperlipidemia   . Hypertension   . IDA (iron deficiency anemia)   . 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 Sleep apnea   . SOB (shortness of breath)   . Stroke ([place])   . Temporal arteritis ([place])   . Thrombocytopenia ([place])   .  Thrombocytopenia ([place])     Review of systems:  Otherwise negative.    Physical Exam  Gen: Alert, oriented. Appears stated age.  HEENT: [place]/AT. PERRLA. Lungs: CTA, no wheezes. CV: RR nl S1, S2. Abd: soft, benign, no masses. BS+ Ext: No edema. Pulses 2+    Planned procedures: Proceed with EGD with possible variceal banding. The patient understands the nature of the planned procedure, indications, risks, alternatives and potential complications including but not limited to bleeding, infection, perforation, damage to internal organs and possible oversedation/side effects from anesthesia. The patient agrees and gives consent to proceed.  Please refer to procedure notes for findings, recommendations and patient disposition/instructions.     Ellen Mayol K. Alice Reichert, M.D. Gastroenterology 07/22/2019  9:14 AM

## 2019-07-22 NOTE — Interval H&P Note (Signed)
History and Physical Interval Note:  07/22/2019 9:16 AM  Theresa [name]  has presented today for surgery, with the diagnosis of Cirrhosis.  The various methods of treatment have been discussed with the patient and family. After consideration of risks, benefits and other options for treatment, the patient has consented to  Procedure(s): ESOPHAGOGASTRODUODENOSCOPY (EGD) WITH PROPOFOL (N/A) as a surgical intervention.  The patient's history has been reviewed, patient examined, no change in status, stable for surgery.  I have reviewed the patient's chart and labs.  Questions were answered to the patient's satisfaction.     [place], [place]

## 2019-07-23 ENCOUNTER — Encounter: Payer: Self-pay | Admitting: *Deleted

## 2019-07-31 ENCOUNTER — Other Ambulatory Visit: Payer: Self-pay

## 2019-07-31 ENCOUNTER — Inpatient Hospital Stay
Admission: EM | Admit: 2019-07-31 | Discharge: 2019-08-02 | DRG: 378 | Disposition: A | Discharge status: Home / Self Care | Payer: Medicare Other | Attending: Internal Medicine | Admitting: Internal Medicine

## 2019-07-31 ENCOUNTER — Emergency Department: Payer: Medicare Other

## 2019-07-31 ENCOUNTER — Encounter: Payer: Self-pay | Admitting: Emergency Medicine

## 2019-07-31 DIAGNOSIS — J690 Pneumonitis due to inhalation of food and vomit: Secondary | ICD-10-CM

## 2019-07-31 DIAGNOSIS — J45909 Unspecified asthma, uncomplicated: Secondary | ICD-10-CM | POA: Diagnosis present

## 2019-07-31 DIAGNOSIS — Z8673 Personal history of transient ischemic attack (TIA), and cerebral infarction without residual deficits: Secondary | ICD-10-CM

## 2019-07-31 DIAGNOSIS — G8929 Other chronic pain: Secondary | ICD-10-CM | POA: Diagnosis present

## 2019-07-31 DIAGNOSIS — K219 Gastro-esophageal reflux disease without esophagitis: Secondary | ICD-10-CM | POA: Diagnosis present

## 2019-07-31 DIAGNOSIS — K7460 Unspecified cirrhosis of liver: Secondary | ICD-10-CM | POA: Diagnosis not present

## 2019-07-31 DIAGNOSIS — K921 Melena: Secondary | ICD-10-CM | POA: Diagnosis not present

## 2019-07-31 DIAGNOSIS — M1990 Unspecified osteoarthritis, unspecified site: Secondary | ICD-10-CM | POA: Diagnosis present

## 2019-07-31 DIAGNOSIS — Z9861 Coronary angioplasty status: Secondary | ICD-10-CM

## 2019-07-31 DIAGNOSIS — Z20822 Contact with and (suspected) exposure to covid-19: Secondary | ICD-10-CM | POA: Diagnosis present

## 2019-07-31 DIAGNOSIS — Z794 Long term (current) use of insulin: Secondary | ICD-10-CM

## 2019-07-31 DIAGNOSIS — Z79899 Other long term (current) drug therapy: Secondary | ICD-10-CM

## 2019-07-31 DIAGNOSIS — N390 Urinary tract infection, site not specified: Secondary | ICD-10-CM | POA: Diagnosis not present

## 2019-07-31 DIAGNOSIS — E119 Type 2 diabetes mellitus without complications: Secondary | ICD-10-CM | POA: Diagnosis present

## 2019-07-31 DIAGNOSIS — I959 Hypotension, unspecified: Secondary | ICD-10-CM | POA: Diagnosis present

## 2019-07-31 DIAGNOSIS — Z7982 Long term (current) use of aspirin: Secondary | ICD-10-CM | POA: Diagnosis not present

## 2019-07-31 DIAGNOSIS — I2510 Atherosclerotic heart disease of native coronary artery without angina pectoris: Secondary | ICD-10-CM | POA: Diagnosis present

## 2019-07-31 DIAGNOSIS — B9620 Unspecified Escherichia coli [E. coli] as the cause of diseases classified elsewhere: Secondary | ICD-10-CM | POA: Diagnosis present

## 2019-07-31 DIAGNOSIS — K7581 Nonalcoholic steatohepatitis (NASH): Secondary | ICD-10-CM | POA: Diagnosis present

## 2019-07-31 DIAGNOSIS — E785 Hyperlipidemia, unspecified: Secondary | ICD-10-CM | POA: Diagnosis present

## 2019-07-31 DIAGNOSIS — K922 Gastrointestinal hemorrhage, unspecified: Secondary | ICD-10-CM

## 2019-07-31 DIAGNOSIS — M545 Low back pain: Secondary | ICD-10-CM | POA: Diagnosis present

## 2019-07-31 DIAGNOSIS — Z8249 Family history of ischemic heart disease and other diseases of the circulatory system: Secondary | ICD-10-CM

## 2019-07-31 DIAGNOSIS — I110 Hypertensive heart disease with heart failure: Secondary | ICD-10-CM | POA: Diagnosis present

## 2019-07-31 DIAGNOSIS — R55 Syncope and collapse: Secondary | ICD-10-CM | POA: Diagnosis not present

## 2019-07-31 DIAGNOSIS — G4730 Sleep apnea, unspecified: Secondary | ICD-10-CM | POA: Diagnosis present

## 2019-07-31 DIAGNOSIS — Z823 Family history of stroke: Secondary | ICD-10-CM

## 2019-07-31 DIAGNOSIS — Z91048 Other nonmedicinal substance allergy status: Secondary | ICD-10-CM

## 2019-07-31 DIAGNOSIS — Z951 Presence of aortocoronary bypass graft: Secondary | ICD-10-CM

## 2019-07-31 DIAGNOSIS — Z1612 Extended spectrum beta lactamase (ESBL) resistance: Secondary | ICD-10-CM | POA: Diagnosis present

## 2019-07-31 DIAGNOSIS — E039 Hypothyroidism, unspecified: Secondary | ICD-10-CM | POA: Diagnosis present

## 2019-07-31 DIAGNOSIS — D62 Acute posthemorrhagic anemia: Secondary | ICD-10-CM | POA: Diagnosis not present

## 2019-07-31 DIAGNOSIS — E869 Volume depletion, unspecified: Secondary | ICD-10-CM | POA: Diagnosis present

## 2019-07-31 DIAGNOSIS — E876 Hypokalemia: Secondary | ICD-10-CM | POA: Diagnosis present

## 2019-07-31 DIAGNOSIS — I509 Heart failure, unspecified: Secondary | ICD-10-CM | POA: Diagnosis present

## 2019-07-31 DIAGNOSIS — Z79890 Hormone replacement therapy (postmenopausal): Secondary | ICD-10-CM

## 2019-07-31 DIAGNOSIS — Z885 Allergy status to narcotic agent status: Secondary | ICD-10-CM

## 2019-07-31 LAB — URINALYSIS, COMPLETE (UACMP) WITH MICROSCOPIC
Bilirubin Urine: NEGATIVE
Glucose, UA: NEGATIVE mg/dL
Hgb urine dipstick: NEGATIVE
Ketones, ur: NEGATIVE mg/dL
Leukocytes,Ua: NEGATIVE
Nitrite: NEGATIVE
Protein, ur: NEGATIVE mg/dL
Specific Gravity, Urine: 1.006 (ref 1.005–1.030)
pH: 7 (ref 5.0–8.0)

## 2019-07-31 LAB — HEPATIC FUNCTION PANEL
ALT: 50 U/L — ABNORMAL HIGH (ref 0–44)
AST: 60 U/L — ABNORMAL HIGH (ref 15–41)
Albumin: 3 g/dL — ABNORMAL LOW (ref 3.5–5.0)
Alkaline Phosphatase: 93 U/L (ref 38–126)
Bilirubin, Direct: 0.6 mg/dL — ABNORMAL HIGH (ref 0.0–0.2)
Indirect Bilirubin: 1.9 mg/dL — ABNORMAL HIGH (ref 0.3–0.9)
Total Bilirubin: 2.5 mg/dL — ABNORMAL HIGH (ref 0.3–1.2)
Total Protein: 6.7 g/dL (ref 6.5–8.1)

## 2019-07-31 LAB — BASIC METABOLIC PANEL
Anion gap: 5 (ref 5–15)
BUN: 19 mg/dL (ref 8–23)
CO2: 29 mmol/L (ref 22–32)
Calcium: 8.4 mg/dL — ABNORMAL LOW (ref 8.9–10.3)
Chloride: 103 mmol/L (ref 98–111)
Creatinine, Ser: 0.96 mg/dL (ref 0.44–1.00)
GFR calc Af Amer: >60 mL/min (ref >60)
GFR calc non Af Amer: 55 mL/min — ABNORMAL LOW (ref >60)
Glucose, Bld: 120 mg/dL — ABNORMAL HIGH (ref 70–99)
Potassium: 3.4 mmol/L — ABNORMAL LOW (ref 3.5–5.1)
Sodium: 137 mmol/L (ref 135–145)

## 2019-07-31 LAB — CBC
HCT: 30.6 % — ABNORMAL LOW (ref 36.0–46.0)
Hemoglobin: 10.4 g/dL — ABNORMAL LOW (ref 12.0–15.0)
MCH: 31 pg (ref 26.0–34.0)
MCHC: 34 g/dL (ref 30.0–36.0)
MCV: 91.1 fL (ref 80.0–100.0)
Platelets: 57 10*3/uL — ABNORMAL LOW (ref 150–400)
RBC: 3.36 MIL/uL — ABNORMAL LOW (ref 3.87–5.11)
RDW: 16.9 % — ABNORMAL HIGH (ref 11.5–15.5)
WBC: 4.2 10*3/uL (ref 4.0–10.5)
nRBC: 0 % (ref 0.0–0.2)

## 2019-07-31 LAB — TYPE AND SCREEN
ABO/RH(D): A POS
Antibody Screen: NEGATIVE

## 2019-07-31 LAB — AMMONIA: Ammonia: 20 umol/L (ref 9–35)

## 2019-07-31 LAB — RESPIRATORY PANEL BY RT PCR (FLU A&B, COVID)
Influenza A by PCR: NEGATIVE
Influenza B by PCR: NEGATIVE
SARS Coronavirus 2 by RT PCR: NEGATIVE

## 2019-07-31 LAB — GLUCOSE, CAPILLARY: Glucose-Capillary: 83 mg/dL (ref 70–99)

## 2019-07-31 LAB — PROCALCITONIN: Procalcitonin: <0.1 ng/mL

## 2019-07-31 LAB — PROTIME-INR
INR: 1.1 (ref 0.8–1.2)
Prothrombin Time: 13.7 seconds (ref 11.4–15.2)

## 2019-07-31 LAB — LACTIC ACID, PLASMA: Lactic Acid, Venous: 1.8 mmol/L (ref 0.5–1.9)

## 2019-07-31 LAB — TROPONIN I (HIGH SENSITIVITY)
Troponin I (High Sensitivity): 21 ng/L — ABNORMAL HIGH (ref <18)
Troponin I (High Sensitivity): 16 ng/L (ref <18)

## 2019-07-31 MED ORDER — MECLIZINE HCL 12.5 MG PO TABS
12.5000 mg | ORAL_TABLET | Freq: Three times a day (TID) | ORAL | Status: DC | PRN
  Filled 2019-07-31: qty 1

## 2019-07-31 MED ORDER — ASCORBIC ACID 500 MG PO TABS
500.0000 mg | ORAL_TABLET | Freq: Every day | ORAL | Status: DC
  Administered 2019-07-31 – 2019-08-02 (×3): 500 mg via ORAL
  Filled 2019-07-31 (×3): qty 1

## 2019-07-31 MED ORDER — SODIUM CHLORIDE 0.9 % IV SOLN
80.0000 mg | Freq: Once | INTRAVENOUS | Status: AC
  Administered 2019-07-31: 80 mg via INTRAVENOUS
  Filled 2019-07-31: qty 80

## 2019-07-31 MED ORDER — SODIUM CHLORIDE 0.9 % IV SOLN
2.0000 g | INTRAVENOUS | Status: DC
  Administered 2019-08-01 – 2019-08-02 (×2): 2 g via INTRAVENOUS
  Filled 2019-07-31: qty 2
  Filled 2019-07-31: qty 20

## 2019-07-31 MED ORDER — GABAPENTIN 100 MG PO CAPS
200.0000 mg | ORAL_CAPSULE | Freq: Every day | ORAL | Status: DC
  Administered 2019-08-01 (×2): 200 mg via ORAL
  Filled 2019-07-31 (×3): qty 2

## 2019-07-31 MED ORDER — POTASSIUM CHLORIDE CRYS ER 20 MEQ PO TBCR
10.0000 meq | EXTENDED_RELEASE_TABLET | Freq: Once | ORAL | Status: AC
  Administered 2019-07-31: 10 meq via ORAL
  Filled 2019-07-31: qty 1

## 2019-07-31 MED ORDER — VANCOMYCIN HCL IN DEXTROSE 1-5 GM/200ML-% IV SOLN
1000.0000 mg | Freq: Once | INTRAVENOUS | Status: AC
  Administered 2019-07-31: 1000 mg via INTRAVENOUS
  Filled 2019-07-31: qty 200

## 2019-07-31 MED ORDER — ONDANSETRON HCL 4 MG/2ML IJ SOLN: 4.0000 mg | Freq: Four times a day (QID) | INTRAMUSCULAR | Status: DC | PRN

## 2019-07-31 MED ORDER — IPRATROPIUM-ALBUTEROL 0.5-2.5 (3) MG/3ML IN SOLN: 3.0000 mL | RESPIRATORY_TRACT | Status: DC | PRN

## 2019-07-31 MED ORDER — ACETAMINOPHEN 325 MG PO TABS
650.0000 mg | ORAL_TABLET | Freq: Four times a day (QID) | ORAL | Status: DC | PRN
  Administered 2019-08-01 – 2019-08-02 (×2): 650 mg via ORAL
  Filled 2019-07-31 (×2): qty 2

## 2019-07-31 MED ORDER — LEVOTHYROXINE SODIUM 50 MCG PO TABS
50.0000 ug | ORAL_TABLET | Freq: Every day | ORAL | Status: DC
  Administered 2019-08-01 – 2019-08-02 (×2): 50 ug via ORAL
  Filled 2019-07-31 (×3): qty 1

## 2019-07-31 MED ORDER — SODIUM CHLORIDE 0.9 % IV SOLN
500.0000 mg | Freq: Once | INTRAVENOUS | Status: AC
  Administered 2019-07-31: 500 mg via INTRAVENOUS
  Filled 2019-07-31: qty 500

## 2019-07-31 MED ORDER — POTASSIUM CHLORIDE CRYS ER 20 MEQ PO TBCR
20.0000 meq | EXTENDED_RELEASE_TABLET | Freq: Two times a day (BID) | ORAL | Status: DC
  Administered 2019-07-31 – 2019-08-02 (×4): 20 meq via ORAL
  Filled 2019-07-31 (×4): qty 1

## 2019-07-31 MED ORDER — TRAZODONE HCL 50 MG PO TABS: 25.0000 mg | ORAL_TABLET | Freq: Every evening | ORAL | Status: DC | PRN

## 2019-07-31 MED ORDER — SIMVASTATIN 20 MG PO TABS
20.0000 mg | ORAL_TABLET | Freq: Every day | ORAL | Status: DC
  Administered 2019-08-01: 20 mg via ORAL
  Filled 2019-07-31: qty 1

## 2019-07-31 MED ORDER — SODIUM CHLORIDE 0.9 % IV SOLN
2.0000 g | Freq: Once | INTRAVENOUS | Status: AC
  Administered 2019-07-31: 2 g via INTRAVENOUS
  Filled 2019-07-31: qty 2

## 2019-07-31 MED ORDER — TORSEMIDE 20 MG PO TABS
20.0000 mg | ORAL_TABLET | Freq: Two times a day (BID) | ORAL | Status: DC
  Filled 2019-07-31: qty 1

## 2019-07-31 MED ORDER — SODIUM CHLORIDE 0.9 % IV BOLUS
500.0000 mL | Freq: Once | INTRAVENOUS | Status: AC
  Administered 2019-07-31: 500 mL via INTRAVENOUS

## 2019-07-31 MED ORDER — POTASSIUM CHLORIDE CRYS ER 20 MEQ PO TBCR: 10.0000 meq | EXTENDED_RELEASE_TABLET | Freq: Every day | ORAL | Status: DC

## 2019-07-31 MED ORDER — ZINC SULFATE 220 (50 ZN) MG PO CAPS
220.0000 mg | ORAL_CAPSULE | Freq: Every day | ORAL | Status: DC
  Administered 2019-07-31 – 2019-08-02 (×3): 220 mg via ORAL
  Filled 2019-07-31 (×3): qty 1

## 2019-07-31 MED ORDER — SODIUM CHLORIDE 0.9 % IV SOLN
8.0000 mg/h | INTRAVENOUS | Status: DC
  Administered 2019-07-31 – 2019-08-01 (×2): 8 mg/h via INTRAVENOUS
  Filled 2019-07-31 (×2): qty 80

## 2019-07-31 MED ORDER — PANTOPRAZOLE SODIUM 40 MG IV SOLR: 40.0000 mg | Freq: Two times a day (BID) | INTRAVENOUS | Status: DC

## 2019-07-31 MED ORDER — ONDANSETRON HCL 4 MG PO TABS: 4.0000 mg | ORAL_TABLET | Freq: Four times a day (QID) | ORAL | Status: DC | PRN

## 2019-07-31 MED ORDER — LISINOPRIL 10 MG PO TABS
10.0000 mg | ORAL_TABLET | Freq: Every day | ORAL | Status: DC
  Administered 2019-08-01 – 2019-08-02 (×2): 10 mg via ORAL
  Filled 2019-07-31 (×3): qty 1

## 2019-07-31 MED ORDER — SODIUM CHLORIDE 0.9 % IV SOLN
500.0000 mg | INTRAVENOUS | Status: DC
  Administered 2019-08-01: 500 mg via INTRAVENOUS
  Filled 2019-07-31 (×2): qty 500

## 2019-07-31 MED ORDER — INSULIN DETEMIR 100 UNIT/ML ~~LOC~~ SOLN
10.0000 [IU] | Freq: Every day | SUBCUTANEOUS | Status: DC
  Filled 2019-07-31 (×3): qty 0.1

## 2019-07-31 MED ORDER — MELATONIN 5 MG PO TABS
5.0000 mg | ORAL_TABLET | Freq: Every day | ORAL | Status: DC
  Administered 2019-07-31 – 2019-08-01 (×2): 5 mg via ORAL
  Filled 2019-07-31 (×3): qty 1

## 2019-07-31 MED ORDER — CALCIUM CARBONATE-VITAMIN D 500-200 MG-UNIT PO TABS
1.0000 | ORAL_TABLET | Freq: Two times a day (BID) | ORAL | Status: DC
  Administered 2019-07-31 – 2019-08-02 (×4): 1 via ORAL
  Filled 2019-07-31 (×6): qty 1

## 2019-07-31 MED ORDER — ADULT MULTIVITAMIN W/MINERALS CH
1.0000 | ORAL_TABLET | Freq: Every day | ORAL | Status: DC
  Administered 2019-07-31 – 2019-08-02 (×3): 1 via ORAL
  Filled 2019-07-31 (×3): qty 1

## 2019-07-31 MED ORDER — RIFAXIMIN 550 MG PO TABS
550.0000 mg | ORAL_TABLET | Freq: Two times a day (BID) | ORAL | Status: DC
  Administered 2019-07-31 – 2019-08-02 (×4): 550 mg via ORAL
  Filled 2019-07-31 (×7): qty 1

## 2019-07-31 MED ORDER — SODIUM CHLORIDE 0.9 % IV SOLN
50.0000 ug/h | INTRAVENOUS | Status: DC
  Administered 2019-07-31: 50 ug/h via INTRAVENOUS
  Filled 2019-07-31 (×4): qty 1

## 2019-07-31 MED ORDER — INSULIN ASPART 100 UNIT/ML ~~LOC~~ SOLN
0.0000 [IU] | SUBCUTANEOUS | Status: DC
  Administered 2019-08-01: 1 [IU] via SUBCUTANEOUS
  Filled 2019-07-31: qty 1

## 2019-07-31 MED ORDER — POTASSIUM CHLORIDE IN NACL 20-0.9 MEQ/L-% IV SOLN
INTRAVENOUS | Status: DC
  Filled 2019-07-31 (×2): qty 1000

## 2019-07-31 MED ORDER — FOLIC ACID 1 MG PO TABS
500.0000 ug | ORAL_TABLET | Freq: Every day | ORAL | Status: DC
  Administered 2019-07-31 – 2019-08-02 (×3): 0.5 mg via ORAL
  Filled 2019-07-31 (×3): qty 1

## 2019-07-31 MED ORDER — FERROUS SULFATE 325 (65 FE) MG PO TABS
325.0000 mg | ORAL_TABLET | Freq: Every day | ORAL | Status: DC
  Administered 2019-08-01 – 2019-08-02 (×2): 325 mg via ORAL
  Filled 2019-07-31 (×2): qty 1

## 2019-07-31 MED ORDER — ACETAMINOPHEN 650 MG RE SUPP: 650.0000 mg | Freq: Four times a day (QID) | RECTAL | Status: DC | PRN

## 2019-07-31 NOTE — ED Notes (Signed)
Pt states that she's not in any pain but feels weak. Pt denies hitting head, no nvd, no shob/cp. Pt states she feels weak, slurred speech noted.

## 2019-07-31 NOTE — Consult Note (Signed)
PHARMACY -  BRIEF ANTIBIOTIC NOTE   Pharmacy has received consult(s) for Vancomycin/Cefepime from an ED provider.  The patient's profile has been reviewed for ht/wt/allergies/indication/available labs.    One time order(s) placed for Vancomycin 1 g x1 dose and Cefepime 2g x1 dose ordered. Will order MRSA PCR.   Further antibiotics/pharmacy consults should be ordered by admitting physician if indicated.                       Thank you, Rowland Lathe 07/31/2019  6:36 PM

## 2019-07-31 NOTE — ED Provider Notes (Signed)
[facility] Emergency Department Provider Note    First MD Initiated Contact with Patient 07/31/19 1737     (approximate)  I have reviewed the triage vital signs and the nursing notes.   HISTORY  Chief Complaint Loss of Consciousness    HPI Theresa [name] is a 79 y.o. female extensive past medical history as listed below presents to the ER for evaluation of syncopal episode.  Reportedly occurred today around 3:00.  Was found unresponsive on Bactrim for after she was using toilet.  No report of melena or hematochezia.  Uncertain as to where she hit her head.  Reportedly has had some slurred speech which is new.  Did undergo recent esophageal variceal banding.  She is not complaining of any pain or nausea.  EMS found the patient her blood pressure was low in the 70s it did respond to IV fluids.    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   Sleep apnea    SOB (shortness of breath)    Stroke (HCC)    Temporal arteritis (HCC)    Thrombocytopenia (HCC)    Thrombocytopenia (HCC)    Family History  Problem Relation Age of Onset   Heart disease Other    Hypertension Other    Anemia Other    Colon cancer Other    CVA Mother    Dementia Mother    CAD Father    Breast cancer Neg Hx    Past Surgical History:  Procedure Laterality Date   COLONOSCOPY  10/24/2013, 2002   hyperplastic polpys    CORONARY ANGIOPLASTY     CORONARY ARTERY BYPASS GRAFT  09/1998   ESOPHAGOGASTRODUODENOSCOPY (EGD) WITH PROPOFOL N/A 08/26/2016   Procedure: ESOPHAGOGASTRODUODENOSCOPY (EGD) WITH PROPOFOL;  Surgeon: Manya Silvas, MD;  Location: [facility] ENDOSCOPY;  Service: Endoscopy;  Laterality: N/A;   ESOPHAGOGASTRODUODENOSCOPY (EGD) WITH PROPOFOL N/A 02/05/2018   Procedure: ESOPHAGOGASTRODUODENOSCOPY (EGD) WITH PROPOFOL;  Surgeon: Manya Silvas, MD;  Location: [facility] ENDOSCOPY;  Service: Endoscopy;  Laterality: N/A;   ESOPHAGOGASTRODUODENOSCOPY (EGD) WITH PROPOFOL N/A 07/22/2019   Procedure: ESOPHAGOGASTRODUODENOSCOPY (EGD) WITH PROPOFOL;  Surgeon: Tol[place], Benay Pike, MD;  Location: [facility] ENDOSCOPY;  Service: Gastroenterology;  Laterality: N/A;   ESOPHAGOGASTRODUODENOSCOPY ENDOSCOPY  05/24/2011   hemorroid surgery     JOINT REPLACEMENT  2003  2005   bil.knees   Patient Active Problem List   Diagnosis Date Noted   GI bleeding 07/31/2019   Orthopnea 11/21/2016   CVA (cerebral vascular accident) ([place]) 04/22/2016   MGUS (monoclonal gammopathy of unknown significance) 10/14/2015   Cirrhosis ([place]) 10/14/2015   Cirrhosis, 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 Authorizing Provider  aspirin EC 81 MG tablet Take 81 mg by mouth daily.    [provider]  blood glucose meter kit and supplies KIT Dispense based on patient and insurance preference. Use up to four times daily as directed. (FOR ICD-9 250.00, 250.01). 04/24/16   Gladstone Lighter, MD  Calcium Carbonate-Vitamin D (CALCIUM-VITAMIN D) 500-200 MG-UNIT tablet Take 1 tablet by mouth 2 (two) times daily.     [provider]  ferrous sulfate 325 (65 FE) MG tablet Take 1 tablet by mouth daily.    [provider]  folic acid (FOLVITE) 818 MCG tablet  Take 400 mcg by mouth daily.     [provider]  gabapentin (NEURONTIN) 100 MG capsule Take 200 mg by mouth at bedtime.  01/26/15   [provider]  insulin detemir (LEVEMIR) 100 unit/ml SOLN Inject 0.2 mLs (20 Units total) into the skin at bedtime. Patient taking differently: Inject 10 Units into the skin at bedtime.  04/24/16   Gladstone Lighter, MD  Insulin Lispro (HUMALOG KWIKPEN ) Inject into the skin 3 (three) times daily before meals.    [provider]  levothyroxine (SYNTHROID, LEVOTHROID) 50 MCG tablet TAKE 1 TABLET DAILY 03/31/15   [provider]  lisinopril (PRINIVIL,ZESTRIL) 10 MG tablet Take 10 mg by mouth daily.     [provider]  meclizine (ANTIVERT) 12.5 MG tablet Take 12.5 mg by mouth 3 (three) times daily as needed for dizziness.    [provider]  Melatonin 3 MG TABS Take 3 mg by mouth at bedtime.     [provider]  Milk Thistle 250 MG CAPS Take 3 capsules by mouth daily.    [provider]  Multiple Vitamin (MULTI-VITAMINS) TABS Take 1 tablet by mouth daily.     [provider]  omeprazole (PRILOSEC) 20 MG capsule TAKE 1 CAPSULE DAILY 03/24/15   [provider]  potassium chloride (K-DUR) 10 MEQ tablet Take 1 tablet by mouth daily. 11/07/16   [provider]  potassium chloride SA (K-DUR,KLOR-CON) 20 MEQ tablet Take 1 tablet (20 mEq total) by mouth 2 (two) times daily. 11/22/16   Cammie Sickle, MD  rifaximin (XIFAXAN) 550 MG TABS tablet Take 550 mg by mouth 2 (two) times daily.  01/20/15   [provider]  simvastatin (ZOCOR) 20 MG tablet TAKE 1 TABLET EVERY EVENING FOR CHOLESTEROL 03/24/15   [provider]  torsemide (DEMADEX) 20 MG tablet Take 1 tablet by mouth 2 (two) times daily. 11/22/16 11/22/17  [provider]  vitamin C (ASCORBIC ACID) 500 MG tablet Take 500 mg by mouth daily.     [provider]  zinc gluconate 50 MG tablet  Take 50 mg by mouth daily.    [provider]    Allergies Morphine and related, Adhesive [tape], and Other    Social History Social History   Tobacco Use   Smoking status: Never Smoker   Smokeless tobacco: Never Used  Substance Use Topics   Alcohol use: No    Alcohol/week: 0.0 standard drinks   Drug use: No    Review of Systems Patient denies headaches, rhinorrhea, blurry vision, numbness, shortness of breath, chest pain, edema, cough, abdominal pain, nausea, vomiting, diarrhea, dysuria, fevers, rashes or hallucinations unless otherwise stated above in HPI. ____________________________________________   PHYSICAL EXAM:  VITAL SIGNS: Vitals:   07/31/19 1900 07/31/19 2000  BP: (!) 144/70 (!) 141/66  Pulse: 61 63  Resp: (!) 29 (!) 24  Temp:  SpO2: 98% 96%    Constitutional: Alert but ill appearing.  Eyes: Conjunctivae are normal.  Head: Atraumatic. Nose: No congestion/rhinnorhea. Mouth/Throat: Mucous membranes are moist.   Neck: No stridor. Painless ROM.  Cardiovascular: Normal rate, regular rhythm. Grossly normal heart sounds.  Good peripheral circulation. Respiratory: Normal respiratory effort.  No retractions. Lungs CTAB. Gastrointestinal: Soft and nontender. No distention. No abdominal bruits. No CVA tenderness. Genitourinary: deferred Musculoskeletal: No lower extremity tenderness nor edema.  No joint effusions. Neurologic:  + asterixis, MAE spontaneous, No other gross focal neurologic deficits are appreciated. No facial droop Skin:  Skin is warm, dry and intact. No rash noted. Psychiatric: Mood and affect are normal. Speech and behavior are normal.  ____________________________________________   LABS (all labs ordered are listed, but only abnormal results are displayed)  Results for orders placed or performed during the hospital encounter of 07/31/19 (from the past 24 hour(s))  Basic metabolic panel     Status: Abnormal   Collection Time:  07/31/19  5:47 PM  Result Value Ref Range   Sodium 137 135 - 145 mmol/L   Potassium 3.4 (L) 3.5 - 5.1 mmol/L   Chloride 103 98 - 111 mmol/L   CO2 29 22 - 32 mmol/L   Glucose, Bld 120 (H) 70 - 99 mg/dL   BUN 19 8 - 23 mg/dL   Creatinine, Ser 0.96 0.44 - 1.00 mg/dL   Calcium 8.4 (L) 8.9 - 10.3 mg/dL   GFR calc non Af Amer 55 (L) >60 mL/min   GFR calc Af Amer >60 >60 mL/min   Anion gap 5 5 - 15  CBC     Status: Abnormal   Collection Time: 07/31/19  5:47 PM  Result Value Ref Range   WBC 4.2 4.0 - 10.5 K/uL   RBC 3.36 (L) 3.87 - 5.11 MIL/uL   Hemoglobin 10.4 (L) 12.0 - 15.0 g/dL   HCT 30.6 (L) 36.0 - 46.0 %   MCV 91.1 80.0 - 100.0 fL   MCH 31.0 26.0 - 34.0 pg   MCHC 34.0 30.0 - 36.0 g/dL   RDW 16.9 (H) 11.5 - 15.5 %   Platelets 57 (L) 150 - 400 K/uL   nRBC 0.0 0.0 - 0.2 %  Urinalysis, Complete w Microscopic     Status: Abnormal   Collection Time: 07/31/19  6:14 PM  Result Value Ref Range   Color, Urine YELLOW (A) YELLOW   APPearance CLEAR (A) CLEAR   Specific Gravity, Urine 1.006 1.005 - 1.030   pH 7.0 5.0 - 8.0   Glucose, UA NEGATIVE NEGATIVE mg/dL   Hgb urine dipstick NEGATIVE NEGATIVE   Bilirubin Urine NEGATIVE NEGATIVE   Ketones, ur NEGATIVE NEGATIVE mg/dL   Protein, ur NEGATIVE NEGATIVE mg/dL   Nitrite NEGATIVE NEGATIVE   Leukocytes,Ua NEGATIVE NEGATIVE   WBC, UA 6-10 0 - 5 WBC/hpf   Bacteria, UA MANY (A) NONE SEEN   Squamous Epithelial / LPF 0-5 0 - 5   Amorphous Crystal PRESENT   Protime-INR     Status: None   Collection Time: 07/31/19  6:14 PM  Result Value Ref Range   Prothrombin Time 13.7 11.4 - 15.2 seconds   INR 1.1 0.8 - 1.2  Ammonia     Status: None   Collection Time: 07/31/19  6:14 PM  Result Value Ref Range   Ammonia 20 9 - 35 umol/L  Type and screen [place]     Status: None   Collection Time: 07/31/19  6:14 PM  Result Value Ref Range   ABO/RH(D) A POS    Antibody Screen NEG    Sample Expiration       08/03/2019,2359 Performed at [facility], [place], [place] [address]   Lactic acid, plasma     Status: None   Collection Time: 07/31/19  6:14 PM  Result Value Ref Range   Lactic Acid, Venous 1.8 0.5 - 1.9 mmol/L  Troponin I (High Sensitivity)     Status: Abnormal   Collection Time: 07/31/19  6:14 PM  Result Value Ref Range   Troponin I (High Sensitivity) 21 (H) <18 ng/L  Hepatic function panel     Status: Abnormal   Collection Time: 07/31/19  6:14 PM  Result Value Ref Range   Total Protein 6.7 6.5 - 8.1 g/dL   Albumin 3.0 (L) 3.5 - 5.0 g/dL   AST 60 (H) 15 - 41 U/L   ALT 50 (H) 0 - 44 U/L   Alkaline Phosphatase 93 38 - 126 U/L   Total Bilirubin 2.5 (H) 0.3 - 1.2 mg/dL   Bilirubin, Direct 0.6 (H) 0.0 - 0.2 mg/dL   Indirect Bilirubin 1.9 (H) 0.3 - 0.9 mg/dL  Procalcitonin     Status: None   Collection Time: 07/31/19  6:38 PM  Result Value Ref Range   Procalcitonin <0.10 ng/mL  Troponin I (High Sensitivity)     Status: None   Collection Time: 07/31/19  7:59 PM  Result Value Ref Range   Troponin I (High Sensitivity) 16 <18 ng/L   ____________________________________________  EKG My review and personal interpretation at Time: 17:44   Indication: syncope  Rate: 70  Rhythm: sinus dysrhythmia vers pacs Axis: normal Other: nonspecific st abn ____________________________________________  RADIOLOGY  I personally reviewed all radiographic images ordered to evaluate for the above acute complaints and reviewed radiology reports and findings.  These findings were personally discussed with the patient.  Please see medical record for radiology report.  ____________________________________________   PROCEDURES  Procedure(s) performed:  .Critical Care Performed by: Merlyn Lot, MD Authorized by: Merlyn Lot, MD   Critical care provider statement:    Critical care time (minutes):  10   Critical care time was exclusive of:  Separately billable  procedures and treating other patients   Critical care was necessary to treat or prevent imminent or life-threatening deterioration of the following conditions:  Circulatory failure   Critical care was time spent personally by me on the following activities:  Development of treatment plan with patient or surrogate, discussions with consultants, evaluation of patient's response to treatment, examination of patient, obtaining history from patient or surrogate, ordering and performing treatments and interventions, ordering and review of laboratory studies, ordering and review of radiographic studies, pulse oximetry, re-evaluation of patient's condition and review of old charts      Critical Care performed: no ____________________________________________   INITIAL IMPRESSION / [place] / ED COURSE  Pertinent labs & imaging results that were available during my care of the patient were reviewed by me and considered in my medical decision making (see chart for details).   DDX: Dysrhythmia, vasovagal, orthostasis, anemia, GI bleed, sepsis, variceal bleed, SDH, hepatic encephalopathy  Theresa [name] is a 79 y.o. who presents to the ED with symptoms as described above.  Patient is ill-appearing with extensive and complex past medical history.  [place] blood pressure is improving with IV fluid bolus.  She is protecting her airway.  Blood work as well as imaging will  be sent for the by differential.  The patient will be placed on continuous pulse oximetry and telemetry for monitoring.  Laboratory evaluation will be sent to evaluate for the above complaints.  Her EKG shows what appears to be a sinus dysrhythmia.  No sign of high-grade block.  Nonspecific changes.  Lower suspicion for ACS but will continue to monitor.   Clinical Course as of Jul 30 2041  Wed Jul 31, 2019  1938 Patient is guaiac positive stool is not melanotic.  Given her recent banding I am going treat with IV Protonix and  octreotide.  She denies chest pain or pressure.  Color does seem to be improving with gentle IV hydration.  Will consult GI as well as discussed with hospitalist for admission.   [PR]    Clinical Course User Index [PR] Merlyn Lot, MD    The patient was evaluated in Emergency Department today for the symptoms described in the history of present illness. He/she was evaluated in the context of the global COVID-19 pandemic, which necessitated consideration that the patient might be at risk for infection with the SARS-CoV-2 virus that causes COVID-19. Institutional protocols and algorithms that pertain to the evaluation of patients at risk for COVID-19 are in a state of rapid change based on information released by regulatory bodies including the CDC and federal and state organizations. These policies and algorithms were followed during the patient's care in the ED.  As part of my medical decision making, I reviewed the following data within the [place] notes reviewed and incorporated, Labs reviewed, notes from prior ED visits and [place] Controlled Substance Database   ____________________________________________   FINAL CLINICAL IMPRESSION(S) / ED DIAGNOSES  Final diagnoses:  Syncope and collapse  Gastrointestinal hemorrhage, unspecified gastrointestinal hemorrhage type      NEW MEDICATIONS STARTED DURING THIS VISIT:  New Prescriptions   No medications on file     Note:  This document was prepared using Dragon voice recognition software and may include unintentional dictation errors.    Merlyn Lot, MD 07/31/19 2043

## 2019-07-31 NOTE — ED Notes (Signed)
Report received from [facility]. Patient care assumed. Patient/RN introduction complete. Will continue to monitor. Pt resting comfortably with family at beside. Pt awaiting bed assignment. No co pain at this time, Octreotide and protonix infusing well.

## 2019-07-31 NOTE — ED Triage Notes (Signed)
Pt arrival via [facility] from home due to syncopal episode. Pt was found on the toilet at home by EMS not responding. EMS got initial BP of 76/42. Pt was last seen nomral at home between 3-3:30 but now reportedly has slurred speech. Pt neg stroke screen with EMS.   Pt had 2 esophageal varices banded last Monday. Pt has hx of non-alcoholic cirrhosis.   EMS started 500 bag of fluids, patient's BP came up to 108/78. BS 177

## 2019-07-31 NOTE — H&P (Signed)
[facility] [place] NAME: Theresa [name]    MR#:  [id number]  DATE OF BIRTH:  1940-11-15  DATE OF ADMISSION:  07/31/2019  PRIMARY CARE PHYSICIAN: Tracie Harrier, MD   REQUESTING/REFERRING PHYSICIAN: Merlyn Lot, MD CHIEF COMPLAINT:   Chief Complaint  Patient presents with  . Loss of Consciousness    HISTORY OF PRESENT ILLNESS:  Theresa [name]  is a 79 y.o. female with a known history of type 2 diabetes mellitus, asthma, coronary artery disease, CHF, liver cirrhosis, esophageal varices status post banding on 07/22/2019 by Dr. Alice Reichert, hypertension, dyslipidemia, thrombocytopenia and CVA, who presented to the emergency room with acute onset of syncope while on her commode while having a bowel movement.  The patient was hypotensive by EMS with blood pressure of 76/42 that responded to a bolus of IV fluids and brought her systolic to 449.  She was still having altered mental status with incoherence per her son who is an EMT.  According to him her bowel movement was normal.  She states that she takes iron pills and her stools have been dark but denied any bright red bleeding per rectum or any change in her bowel movements color.  She has been having mild cough productive of clear sputum and admitted to choking sometimes on water and stated that it occasionally goes through my windpipe.  She denied any heartburn or nausea or vomiting or abdominal pain.  No chest pain or palpitations.  No paresthesias or focal muscle weakness.  She denies any significant dysuria, oliguria or hematuria or flank pain.  Upon presentation to the emergency room, heart rate was 59 and respiratory to 21 with otherwise normal vital signs.  Labs were remarkable for mild hypokalemia 3.4, AST of 16 ALT of 50 and indirect bilirubin of 1.9 with total bilirubin of 2.5 and albumin of 3 with total protein of 6.7.  High-sensitivity troponin I was 21.  Lactic acid is 1.8.  Hemoglobin hematocrit 10.1 and 30.6  compared to 11.3 and 33.7 with platelets of a 57 compared to 60 on 02/05/2018. Urinalysis showed 6-10 WBCs with many bacteria.  Portable chest x-ray showed her midline sternotomy moderate sized right-sided pleural effusion with adjacent airspace disease favored to represent atelectasis without infiltrate not entirely excluded and mild vascular congestion with possible early interstitial edema.  The patient was given empiric IV antibiotic therapy with cefepime, Zithromax and vancomycin.  She had positive stool Hemoccult.  She was started on IV Protonix bolus and drip and given history of esophageal varices IV octreotide bolus and drip.  She was also given 500 mL of a normal saline bolus.  She will be admitted to a progressive unit bed for further evaluation and management. 3 showed   Past Medical History:  Diagnosis Date  . Anemia   . Arthritis   . Asthma   . Bradycardia   . CAD (coronary artery disease)    BIL.CAROTID ARTERY STENOSIS  . CHF (congestive heart failure) ([place])   . Chronic low back pain   . Cirrhosis of liver not due to alcohol ([place])   . Colon polyps   . Diabetes mellitus without complication ([place])   . Dyspnea   . Esophageal varices without bleeding ([place])   . GERD (gastroesophageal reflux disease)   . Hip pain   . Hyperlipidemia   . Hypertension   . IDA (iron deficiency anemia)   . 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 Sleep apnea   .  SOB (shortness of breath)   . Stroke ([place])   . Temporal arteritis ([place])   . Thrombocytopenia ([place])   . Thrombocytopenia ([place])     PAST SURGICAL HISTORY:   Past Surgical History:  Procedure Laterality Date  . COLONOSCOPY  10/24/2013, 2002   hyperplastic polpys  . CORONARY ANGIOPLASTY    . CORONARY ARTERY BYPASS GRAFT  09/1998  . ESOPHAGOGASTRODUODENOSCOPY (EGD) WITH PROPOFOL N/A 08/26/2016   Procedure: ESOPHAGOGASTRODUODENOSCOPY (EGD) WITH PROPOFOL;  Surgeon: Manya Silvas, MD;  Location: [facility] ENDOSCOPY;   Service: Endoscopy;  Laterality: N/A;  . ESOPHAGOGASTRODUODENOSCOPY (EGD) WITH PROPOFOL N/A 02/05/2018   Procedure: ESOPHAGOGASTRODUODENOSCOPY (EGD) WITH PROPOFOL;  Surgeon: Manya Silvas, MD;  Location: [facility] ENDOSCOPY;  Service: Endoscopy;  Laterality: N/A;  . ESOPHAGOGASTRODUODENOSCOPY (EGD) WITH PROPOFOL N/A 07/22/2019   Procedure: ESOPHAGOGASTRODUODENOSCOPY (EGD) WITH PROPOFOL;  Surgeon: Tol[place], Benay Pike, MD;  Location: [facility] ENDOSCOPY;  Service: Gastroenterology;  Laterality: N/A;  . ESOPHAGOGASTRODUODENOSCOPY ENDOSCOPY  05/24/2011  . hemorroid surgery    . JOINT REPLACEMENT  2003  2005   bil.knees    SOCIAL HISTORY:   Social History   Tobacco Use  . Smoking status: Never Smoker  . Smokeless tobacco: Never Used  Substance Use Topics  . Alcohol use: No    Alcohol/week: 0.0 standard drinks    FAMILY HISTORY:   Family History  Problem Relation Age of Onset  . Heart disease Other   . Hypertension Other   . Anemia Other   . Colon cancer Other   . CVA Mother   . Dementia Mother   . CAD Father   . Breast cancer Neg Hx     DRUG ALLERGIES:   Allergies  Allergen Reactions  . Morphine And Related Nausea And Vomiting    Other reaction(s): Nausea And Vomiting  . Adhesive [Tape] Rash  . Other Rash    NEOPRENE, TAPE, BAND-AID TOUGH STRIPS    REVIEW OF SYSTEMS:   ROS As per history of present illness. All pertinent systems were reviewed above. Constitutional,  HEENT, cardiovascular, respiratory, GI, GU, musculoskeletal, neuro, psychiatric, endocrine,  integumentary and hematologic systems were reviewed and are otherwise  negative/unremarkable except for positive findings mentioned above in the HPI.   MEDICATIONS AT HOME:   Prior to Admission medications   Medication Sig Start Date End Date Taking? Authorizing Provider  aspirin EC 81 MG tablet Take 81 mg by mouth daily.    [provider]  blood glucose meter kit and supplies KIT Dispense based on patient  and insurance preference. Use up to four times daily as directed. (FOR ICD-9 250.00, 250.01). 04/24/16   Gladstone Lighter, MD  Calcium Carbonate-Vitamin D (CALCIUM-VITAMIN D) 500-200 MG-UNIT tablet Take 1 tablet by mouth 2 (two) times daily.     [provider]  ferrous sulfate 325 (65 FE) MG tablet Take 1 tablet by mouth daily.    [provider]  folic acid (FOLVITE) 540 MCG tablet Take 400 mcg by mouth daily.     [provider]  gabapentin (NEURONTIN) 100 MG capsule Take 200 mg by mouth at bedtime.  01/26/15   [provider]  insulin detemir (LEVEMIR) 100 unit/ml SOLN Inject 0.2 mLs (20 Units total) into the skin at bedtime. Patient taking differently: Inject 10 Units into the skin at bedtime.  04/24/16   Gladstone Lighter, MD  Insulin Lispro (HUMALOG KWIKPEN [place]) Inject into the skin 3 (three) times daily before meals.    [provider]  levothyroxine (SYNTHROID, [place])  50 MCG tablet TAKE 1 TABLET DAILY 03/31/15   [provider]  lisinopril (PRINIVIL,ZESTRIL) 10 MG tablet Take 10 mg by mouth daily.     [provider]  meclizine (ANTIVERT) 12.5 MG tablet Take 12.5 mg by mouth 3 (three) times daily as needed for dizziness.    [provider]  Melatonin 3 MG TABS Take 3 mg by mouth at bedtime.     [provider]  Milk Thistle 250 MG CAPS Take 3 capsules by mouth daily.    [provider]  Multiple Vitamin (MULTI-VITAMINS) TABS Take 1 tablet by mouth daily.     [provider]  omeprazole (PRILOSEC) 20 MG capsule TAKE 1 CAPSULE DAILY 03/24/15   [provider]  potassium chloride (K-DUR) 10 MEQ tablet Take 1 tablet by mouth daily. 11/07/16   [provider]  potassium chloride SA (K-DUR,KLOR-CON) 20 MEQ tablet Take 1 tablet (20 mEq total) by mouth 2 (two) times daily. 11/22/16   Cammie Sickle, MD  rifaximin (XIFAXAN) 550 MG TABS tablet Take 550 mg by mouth 2 (two) times  daily.  01/20/15   [provider]  simvastatin (ZOCOR) 20 MG tablet TAKE 1 TABLET EVERY EVENING FOR CHOLESTEROL 03/24/15   [provider]  torsemide (DEMADEX) 20 MG tablet Take 1 tablet by mouth 2 (two) times daily. 11/22/16 11/22/17  [provider]  vitamin C (ASCORBIC ACID) 500 MG tablet Take 500 mg by mouth daily.     [provider]  zinc gluconate 50 MG tablet Take 50 mg by mouth daily.    [provider]      VITAL SIGNS:  Blood pressure (!) 144/70, pulse 61, temperature (!) 96.7 F (35.9 C), temperature source Rectal, resp. rate (!) 29, height 5' (1.524 m), weight 73 kg, SpO2 98 %.  PHYSICAL EXAMINATION:  Physical Exam  GENERAL:  79 y.o.-year-old patient lying in the bed with no acute distress.  EYES: Pupils equal, round, reactive to light and accommodation. No scleral icterus. Extraocular muscles intact.  HEENT: Head atraumatic, normocephalic. Oropharynx and nasopharynx clear.  NECK:  Supple, no jugular venous distention. No thyroid enlargement, no tenderness.  LUNGS: Normal breath sounds bilaterally, no wheezing, rales,rhonchi or crepitation. No use of accessory muscles of respiration.  CARDIOVASCULAR: Regular rate and rhythm, S1, S2 normal. No murmurs, rubs, or gallops.  ABDOMEN: Soft, nondistended, nontender. Bowel sounds present. No organomegaly or mass.  EXTREMITIES: No pedal edema, cyanosis, or clubbing.  NEUROLOGIC: Cranial nerves II through XII are intact. Muscle strength 5/5 in all extremities. Sensation intact. Gait not checked.  PSYCHIATRIC: The patient is alert and oriented x 3.  Normal affect and good eye contact. SKIN: No obvious rash, lesion, or ulcer.   LABORATORY PANEL:   CBC Recent Labs  Lab 07/31/19 1747  WBC 4.2  HGB 10.4*  HCT 30.6*  PLT 57*   ------------------------------------------------------------------------------------------------------------------  Chemistries  Recent Labs  Lab 07/31/19 1747  07/31/19 1814  NA 137  --   K 3.4*  --   CL 103  --   CO2 29  --   GLUCOSE 120*  --   BUN 19  --   CREATININE 0.96  --   CALCIUM 8.4*  --   AST  --  60*  ALT  --  50*  ALKPHOS  --  93  BILITOT  --  2.5*   ------------------------------------------------------------------------------------------------------------------  Cardiac Enzymes No results for input(s): TROPONINI in the last 168 hours. ------------------------------------------------------------------------------------------------------------------  RADIOLOGY:  CT Head Wo Contrast  Result Date: 07/31/2019 CLINICAL DATA:  Neuro deficit, syncope EXAM: CT HEAD WITHOUT CONTRAST TECHNIQUE: Contiguous axial images were obtained from the base of the skull through the vertex without intravenous contrast. COMPARISON:  04/22/2016 FINDINGS: Brain: No evidence of acute infarction, hemorrhage, hydrocephalus, extra-axial collection or mass lesion/mass effect. Prominence of the lateral ventricles, unchanged since prior examination likely due to global volume loss. Periventricular white matter hypodensity. Vascular: No hyperdense vessel or unexpected calcification. Skull: Normal. Negative for fracture or focal lesion. Sinuses/Orbits: No acute finding. Other: None. IMPRESSION: No acute intracranial pathology. Small-vessel white matter disease and global volume loss. Electronically Signed   By: Eddie Candle M.D.   On: 07/31/2019 18:40   DG Chest Portable 1 View  Result Date: 07/31/2019 CLINICAL DATA:  Altered mental status. Weakness. EXAM: PORTABLE CHEST 1 VIEW COMPARISON:  November 21, 2016 FINDINGS: There is a moderate-sized right-sided pleural effusion with adjacent airspace disease favored to represent atelectasis with an infiltrate not entirely excluded. The heart size is stable. Aortic calcifications are noted. The patient is status post prior median sternotomy. There is no pneumothorax. There is some vascular congestion with possible early  interstitial edema. IMPRESSION: 1. Moderate-sized right-sided pleural effusion with adjacent airspace disease favored to represent atelectasis with an infiltrate not entirely excluded. 2. Mild vascular congestion with possible early interstitial edema. Electronically Signed   By: Constance Holster M.D.   On: 07/31/2019 18:14      IMPRESSION AND PLAN:   1.  GI bleeding with mild acute blood loss anemia on chronic anemia.  The patient has a history of esophageal varices status post banding on 4/26  -The patient will be admitted to a progressive unit bed. -We will follow serial hemoglobin and hematocrits. -We will continue her on IV Protonix drip as well as octreotide drip. -We will obtain a GI consultation.  I notified Dr. Mardi Mainland about the patient. -She will be gently hydrated with IV normal saline especially given her initial hypotension. -She will be kept n.p.o. -Aspirin will be held off.  2.  Syncope. -This could be neurally mediated as it happened during a bowel movement.  It could be partly related to GI bleeding and volume depletion especially in the setting of hypotension. -She will be gently hydrated with IV normal saline and will check her orthostatics. -Should be monitored for arrhythmias.  3.  Right-sided basal pneumonia with pleural effusion, likely parapneumonic. -This could be aspiration pneumonia especially given recent choking on fluids. -The patient will be placed on IV Rocephin and Zithromax as well as duo nebs and mucolytics.  4.  Hypokalemia. -Potassium will be replaced and magnesium level will be checked. -Speech therapy consult to be obtained.  5.  Possible UTI. -Urine culture will be obtained. -This should be covered with IV Rocephin.  6.  Liver cirrhosis. -The patient is ammonia levels normal at 20. -We will continue rifaximin.  7.  Type II diabetes mellitus. -The patient will be placed on supplement coverage with NovoLog. -We will continue basal coverage  will be cut down to half when the patient is n.p.o. -We will hold Metformin.  8.  Hypothyroidism. -We will check TSH and continue Synthroid.  9.  DVT prophylaxis. -SCDs. -Medical prophylaxis contraindicated due to GI bleeding.  10.  GI prophylaxis. -This was addressed above.  All the records are reviewed and case discussed with ED provider. The plan of care was discussed in details with the patient (and family). I answered all questions. The  patient agreed to proceed with the above mentioned plan. Further management will depend upon hospital course.   CODE STATUS: This was discussed with the patient and she wants to be full code  Status is: Inpatient  Remains inpatient appropriate because:Altered mental status, Ongoing diagnostic testing needed not appropriate for outpatient work up, Unsafe d/c plan, IV treatments appropriate due to intensity of illness or inability to take PO and Inpatient level of care appropriate due to severity of illness   Dispo: The patient is from: Home              Anticipated d/c is to: Home              Anticipated d/c date is: 2 days              Patient currently is not medically stable to d/c.    TOTAL TIME TAKING CARE OF THIS PATIENT: 55 minutes.    Christel Mormon M.D on 07/31/2019 at 8:08 PM  [facility]   From 7 PM-7 AM, contact night-coverage [email or web address]  CC: Primary care physician; Tracie Harrier, MD   Note: This dictation was prepared with Dragon dictation along with smaller phrase technology. Any transcriptional errors that result from this process are unintentional.

## 2019-08-01 ENCOUNTER — Inpatient Hospital Stay: Payer: Medicare Other

## 2019-08-01 DIAGNOSIS — K922 Gastrointestinal hemorrhage, unspecified: Secondary | ICD-10-CM

## 2019-08-01 DIAGNOSIS — K7460 Unspecified cirrhosis of liver: Secondary | ICD-10-CM

## 2019-08-01 DIAGNOSIS — K7581 Nonalcoholic steatohepatitis (NASH): Secondary | ICD-10-CM

## 2019-08-01 DIAGNOSIS — R55 Syncope and collapse: Secondary | ICD-10-CM

## 2019-08-01 LAB — HIV ANTIBODY (ROUTINE TESTING W REFLEX): HIV Screen 4th Generation wRfx: NONREACTIVE

## 2019-08-01 LAB — COMPREHENSIVE METABOLIC PANEL
ALT: 51 U/L — ABNORMAL HIGH (ref 0–44)
AST: 58 U/L — ABNORMAL HIGH (ref 15–41)
Albumin: 2.8 g/dL — ABNORMAL LOW (ref 3.5–5.0)
Alkaline Phosphatase: 80 U/L (ref 38–126)
Anion gap: 5 (ref 5–15)
BUN: 17 mg/dL (ref 8–23)
CO2: 25 mmol/L (ref 22–32)
Calcium: 7.7 mg/dL — ABNORMAL LOW (ref 8.9–10.3)
Chloride: 109 mmol/L (ref 98–111)
Creatinine, Ser: 0.84 mg/dL (ref 0.44–1.00)
GFR calc Af Amer: >60 mL/min (ref >60)
GFR calc non Af Amer: >60 mL/min (ref >60)
Glucose, Bld: 104 mg/dL — ABNORMAL HIGH (ref 70–99)
Potassium: 4 mmol/L (ref 3.5–5.1)
Sodium: 139 mmol/L (ref 135–145)
Total Bilirubin: 3 mg/dL — ABNORMAL HIGH (ref 0.3–1.2)
Total Protein: 6.1 g/dL — ABNORMAL LOW (ref 6.5–8.1)

## 2019-08-01 LAB — GLUCOSE, CAPILLARY
Glucose-Capillary: 101 mg/dL — ABNORMAL HIGH (ref 70–99)
Glucose-Capillary: 99 mg/dL (ref 70–99)
Glucose-Capillary: 101 mg/dL — ABNORMAL HIGH (ref 70–99)
Glucose-Capillary: 98 mg/dL (ref 70–99)
Glucose-Capillary: 138 mg/dL — ABNORMAL HIGH (ref 70–99)
Glucose-Capillary: 109 mg/dL — ABNORMAL HIGH (ref 70–99)
Glucose-Capillary: 175 mg/dL — ABNORMAL HIGH (ref 70–99)

## 2019-08-01 LAB — CBC
HCT: 29.7 % — ABNORMAL LOW (ref 36.0–46.0)
Hemoglobin: 10 g/dL — ABNORMAL LOW (ref 12.0–15.0)
MCH: 30.6 pg (ref 26.0–34.0)
MCHC: 33.7 g/dL (ref 30.0–36.0)
MCV: 90.8 fL (ref 80.0–100.0)
Platelets: 48 10*3/uL — ABNORMAL LOW (ref 150–400)
RBC: 3.27 MIL/uL — ABNORMAL LOW (ref 3.87–5.11)
RDW: 17 % — ABNORMAL HIGH (ref 11.5–15.5)
WBC: 3.5 10*3/uL — ABNORMAL LOW (ref 4.0–10.5)
nRBC: 0 % (ref 0.0–0.2)

## 2019-08-01 LAB — MAGNESIUM: Magnesium: 2.4 mg/dL (ref 1.7–2.4)

## 2019-08-01 LAB — HEMOGLOBIN A1C
Hgb A1c MFr Bld: 5.4 % (ref 4.8–5.6)
Mean Plasma Glucose: 108.28 mg/dL

## 2019-08-01 MED ORDER — TORSEMIDE 20 MG PO TABS
20.0000 mg | ORAL_TABLET | Freq: Two times a day (BID) | ORAL | Status: DC
  Administered 2019-08-01 – 2019-08-02 (×3): 20 mg via ORAL
  Filled 2019-08-01 (×4): qty 1

## 2019-08-01 MED ORDER — PANTOPRAZOLE SODIUM 40 MG IV SOLR
40.0000 mg | Freq: Two times a day (BID) | INTRAVENOUS | Status: DC
  Administered 2019-08-01 – 2019-08-02 (×3): 40 mg via INTRAVENOUS
  Filled 2019-08-01 (×3): qty 40

## 2019-08-01 MED ORDER — IOHEXOL 300 MG/ML  SOLN
75.0000 mL | Freq: Once | INTRAMUSCULAR | Status: AC | PRN
  Administered 2019-08-01: 75 mL via INTRAVENOUS

## 2019-08-01 NOTE — ED Notes (Signed)
Pt requesting for lights dimmed and to rest.

## 2019-08-01 NOTE — Consult Note (Signed)
Theresa Antigua, MD [address], [place], [place], [place], [address] [address], [place], [place], [place], [address] Phone: [phone]  Fax: [phone]  Consultation  Referring Provider:     Dr. Sidney Ace Primary Care Physician:  Tracie Harrier, MD Reason for Consultation:    GI bleeding  Date of Admission:  07/31/2019 Date of Consultation:  08/01/2019         HPI:   Theresa [name] is a 79 y.o. female admitted with syncope while having a bowel movement at home, and GI was consulted due to Hemoccult positive stool.  Patient and husband deny any episodes of bleeding or any sources of bleeding.  No epistaxis, blood in urine, blood in stool, melena, hematemesis, vomiting.  Patient sees Dr. Alice Reichert and has history of [name] cirrhosis with esophageal banding on July 22, 2019 for grade 2 esophageal varices.   Past Medical History:  Diagnosis Date  . Anemia   . Arthritis   . Asthma   . Bradycardia   . CAD (coronary artery disease)    BIL.CAROTID ARTERY STENOSIS  . CHF (congestive heart failure) ([place])   . Chronic low back pain   . Cirrhosis of liver not due to alcohol ([place])   . Colon polyps   . Diabetes mellitus without complication ([place])   . Dyspnea   . Esophageal varices without bleeding ([place])   . GERD (gastroesophageal reflux disease)   . Hip pain   . Hyperlipidemia   . Hypertension   . IDA (iron deficiency anemia)   . 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 Sleep apnea   . SOB (shortness of breath)   . Stroke ([place])   . Temporal arteritis ([place])   . Thrombocytopenia ([place])   . Thrombocytopenia ([place])     Past Surgical History:  Procedure Laterality Date  . COLONOSCOPY  10/24/2013, 2002   hyperplastic polpys  . CORONARY ANGIOPLASTY    . CORONARY ARTERY BYPASS GRAFT  09/1998  . ESOPHAGOGASTRODUODENOSCOPY (EGD) WITH PROPOFOL N/A 08/26/2016   Procedure: ESOPHAGOGASTRODUODENOSCOPY (EGD) WITH PROPOFOL;  Surgeon: Manya Silvas, MD;  Location:  [facility] ENDOSCOPY;  Service: Endoscopy;  Laterality: N/A;  . ESOPHAGOGASTRODUODENOSCOPY (EGD) WITH PROPOFOL N/A 02/05/2018   Procedure: ESOPHAGOGASTRODUODENOSCOPY (EGD) WITH PROPOFOL;  Surgeon: Manya Silvas, MD;  Location: [facility] ENDOSCOPY;  Service: Endoscopy;  Laterality: N/A;  . ESOPHAGOGASTRODUODENOSCOPY (EGD) WITH PROPOFOL N/A 07/22/2019   Procedure: ESOPHAGOGASTRODUODENOSCOPY (EGD) WITH PROPOFOL;  Surgeon: Toledo, Benay Pike, MD;  Location: [facility] ENDOSCOPY;  Service: Gastroenterology;  Laterality: N/A;  . ESOPHAGOGASTRODUODENOSCOPY ENDOSCOPY  05/24/2011  . hemorroid surgery    . JOINT REPLACEMENT  2003  2005   [email or web address]    Prior to Admission medications   Medication Sig Start Date End Date Taking? Authorizing Provider  alendronate (FOSAMAX) 70 MG tablet Take 70 mg by mouth every Monday. Take with a full glass of water on an empty stomach.   Yes [provider]  aspirin EC 81 MG tablet Take 81 mg by mouth daily.   Yes [provider]  Calcium Carbonate-Vitamin D (CALCIUM-VITAMIN D) 500-200 MG-UNIT tablet Take 1 tablet by mouth 2 (two) times daily.    Yes [provider]  Cranberry-Milk Thistle 250-75 MG CAPS Take 1 capsule by mouth daily.   Yes [provider]  ferrous sulfate 325 (65 FE) MG tablet Take 325 mg by mouth daily.    Yes [provider]  folic acid (FOLVITE) 882 MCG tablet Take 400 mcg by mouth  daily.    Yes [provider]  gabapentin (NEURONTIN) 100 MG capsule Take 200 mg by mouth at bedtime.  01/26/15  Yes [provider]  levothyroxine (SYNTHROID, LEVOTHROID) 50 MCG tablet Take 50 mcg by mouth daily before breakfast.  03/31/15  Yes [provider]  lisinopril (PRINIVIL,ZESTRIL) 10 MG tablet Take 10 mg by mouth daily.    Yes [provider]  meclizine (ANTIVERT) 12.5 MG tablet Take 12.5 mg by mouth 3 (three) times daily as needed for dizziness.   Yes [provider]  Melatonin 10 MG CAPS  Take 10 mg by mouth at bedtime.    Yes [provider]  metFORMIN (GLUCOPHAGE-XR) 500 MG 24 hr tablet Take 1,000 mg by mouth daily with supper.   Yes [provider]  Multiple Vitamin (MULTI-VITAMINS) TABS Take 1 tablet by mouth daily.    Yes [provider]  omeprazole (PRILOSEC) 20 MG capsule Take 20 mg by mouth daily.  03/24/15  Yes [provider]  potassium chloride SA (K-DUR,KLOR-CON) 20 MEQ tablet Take 1 tablet (20 mEq total) by mouth 2 (two) times daily. 11/22/16  Yes Cammie Sickle, MD  rifaximin (XIFAXAN) 550 MG TABS tablet Take 550 mg by mouth 2 (two) times daily.  01/20/15  Yes [provider]  simvastatin (ZOCOR) 20 MG tablet Take 20 mg by mouth at bedtime.  03/24/15  Yes [provider]  torsemide (DEMADEX) 20 MG tablet Take 20 mg by mouth 2 (two) times daily.   Yes [provider]  vitamin C (ASCORBIC ACID) 500 MG tablet Take 500 mg by mouth daily.    Yes [provider]  zinc gluconate 50 MG tablet Take 50 mg by mouth daily.   Yes [provider]  blood glucose meter kit and supplies KIT Dispense based on patient and insurance preference. Use up to four times daily as directed. (FOR ICD-9 250.00, 250.01). 04/24/16   Gladstone Lighter, MD    Family History  Problem Relation Age of Onset  . Heart disease Other   . Hypertension Other   . Anemia Other   . Colon cancer Other   . CVA Mother   . Dementia Mother   . CAD Father   . Breast cancer Neg Hx      Social History   Tobacco Use  . Smoking status: Never Smoker  . Smokeless tobacco: Never Used  Substance Use Topics  . Alcohol use: No    Alcohol/week: 0.0 standard drinks  . Drug use: No    Allergies as of 07/31/2019 - Review Complete 07/31/2019  Allergen Reaction Noted  . Morphine and related Nausea And Vomiting 11/14/2014  . Adhesive [tape] Rash 11/14/2014  . Other Rash 03/31/2015    Review of Systems:    All systems  reviewed and negative except where noted in HPI.   Physical Exam:  Vital signs in last 24 hours: Vitals:   08/01/19 1421 08/01/19 1452 08/01/19 1459 08/01/19 1502  BP: (!) 141/77 (!) 142/94 (!) 151/83 (!) 163/76  Pulse: 79 64 64 66  Resp: _0 Temp: 97.7 F (36.5 C) 98.4 F (36.9 C)    TempSrc: Oral Oral    SpO2: 96% 97% 98% 97%  Weight:      Height:         General:   Pleasant, cooperative in NAD Head:  Normocephalic and atraumatic. Eyes:   No icterus.   Conjunctiva pink. PERRLA. Ears:  Normal auditory acuity.  Neck:  Supple; no masses or thyroidomegaly Lungs: Respirations even and unlabored. Lungs clear to auscultation bilaterally.   No wheezes, crackles, or rhonchi.  Abdomen:  Soft, nondistended, nontender. Normal bowel sounds. No appreciable masses or hepatomegaly.  No rebound or guarding.  Neurologic:  Alert and oriented x3;  grossly normal neurologically. Skin:  Intact without significant lesions or rashes. Cervical Nodes:  No significant cervical adenopathy. Psych:  Alert and cooperative. Normal affect.  LAB RESULTS: Recent Labs    07/31/19 1747 08/01/19 0433  WBC 4.2 3.5*  HGB 10.4* 10.0*  HCT 30.6* 29.7*  PLT 57* 48*   BMET Recent Labs    07/31/19 1747 08/01/19 0433  NA 137 139  K 3.4* 4.0  CL 103 109  CO2 29 25  GLUCOSE 120* 104*  BUN 19 17  CREATININE 0.96 0.84  CALCIUM 8.4* 7.7*   LFT Recent Labs    07/31/19 1814 07/31/19 1814 08/01/19 0433  PROT 6.7   < > 6.1*  ALBUMIN 3.0*   < > 2.8*  AST 60*   < > 58*  ALT 50*   < > 51*  ALKPHOS 93   < > 80  BILITOT 2.5*   < > 3.0*  BILIDIR 0.6*  --   --   IBILI 1.9*  --   --    < > = values in this interval not displayed.   PT/INR Recent Labs    07/31/19 1814  LABPROT 13.7  INR 1.1    STUDIES: CT Head Wo Contrast  Result Date: 07/31/2019 CLINICAL DATA:  Neuro deficit, syncope EXAM: CT HEAD WITHOUT CONTRAST TECHNIQUE: Contiguous axial images were obtained from the base of the skull  through the vertex without intravenous contrast. COMPARISON:  04/22/2016 FINDINGS: Brain: No evidence of acute infarction, hemorrhage, hydrocephalus, extra-axial collection or mass lesion/mass effect. Prominence of the lateral ventricles, unchanged since prior examination likely due to global volume loss. Periventricular white matter hypodensity. Vascular: No hyperdense vessel or unexpected calcification. Skull: Normal. Negative for fracture or focal lesion. Sinuses/Orbits: No acute finding. Other: None. IMPRESSION: No acute intracranial pathology. Small-vessel white matter disease and global volume loss. Electronically Signed   By: Eddie Candle M.D.   On: 07/31/2019 18:40   CT CHEST W CONTRAST  Result Date: 08/01/2019 CLINICAL DATA:  Cough and chest pain EXAM: CT CHEST WITH CONTRAST TECHNIQUE: Multidetector CT imaging of the chest was performed during intravenous contrast administration. CONTRAST:  15m OMNIPAQUE IOHEXOL 300 MG/ML  SOLN COMPARISON:  Chest radiograph Jul 31, 2019 and CT chest March 17, 2008 FINDINGS: Cardiovascular: No pulmonary embolus evident. No thoracic aortic aneurysm or dissection. There are scattered foci of calcification in visualized great vessels. There are foci of aortic atherosclerosis. There is calcification in the aortic valve. There are scattered foci of native coronary artery calcification. There is no pericardial effusion or pericardial thickening. Heart is mildly enlarged. 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 Mediastinum/Nodes: Visualized thyroid appears normal. There is no appreciable adenopathy by size criteria. There are scattered subcentimeter mediastinal lymph nodes. There are no appreciable esophageal lesions. Lungs/Pleura: There is a moderate free-flowing pleural effusion on the right. There is consolidation in the right lower lobe with air bronchograms, likely due to a combination of atelectasis and potential superimposed pneumonia. There is  scarring in the left upper lobe medially and anteriorly. There is atelectatic change in the left base. Upper Abdomen: The liver has a nodular contour consistent with cirrhosis. Spleen is incompletely visualized but is  noted to be enlarged. Spleen measures 17.0 cm from anterior to posterior dimension. There is upper abdominal aortic atherosclerosis. There is ascites in the upper right abdomen. Musculoskeletal: Status post median sternotomy. There is degenerative change in the thoracic spine. No blastic or lytic bone lesions. No evident chest wall lesions. IMPRESSION: 1. Moderate right pleural effusion with consolidation and compressive atelectasis in the right lower lobe. Atelectasis noted in the left base. Scarring in the left upper lobe anteriorly and medially, stable. 2.  No adenopathy evident. 3. Aortic atherosclerosis. Status post coronary artery bypass grafting. Foci of great vessel and native coronary artery calcification noted. Mild calcification in the aortic valve also noted. 4. Evidence of hepatic cirrhosis. Splenomegaly noted with spleen incompletely visualized. 5.  There is ascites adjacent to the liver edge. Aortic Atherosclerosis (ICD10-I70.0). Electronically Signed   By: Lowella Grip III M.D.   On: 08/01/2019 09:35   DG Chest Portable 1 View  Result Date: 07/31/2019 CLINICAL DATA:  Altered mental status. Weakness. EXAM: PORTABLE CHEST 1 VIEW COMPARISON:  November 21, 2016 FINDINGS: There is a moderate-sized right-sided pleural effusion with adjacent airspace disease favored to represent atelectasis with an infiltrate not entirely excluded. The heart size is stable. Aortic calcifications are noted. The patient is status post prior median sternotomy. There is no pneumothorax. There is some vascular congestion with possible early interstitial edema. IMPRESSION: 1. Moderate-sized right-sided pleural effusion with adjacent airspace disease favored to represent atelectasis with an infiltrate not  entirely excluded. 2. Mild vascular congestion with possible early interstitial edema. Electronically Signed   By: Constance Holster M.D.   On: 07/31/2019 18:14      Impression / Plan:   ECE CUMBERLAND is a 79 y.o. y/o female with history of [name] cirrhosis followed by Dr. Alice Reichert, with recent outpatient upper endoscopy for variceal banding with 2 bands placed and no subsequent GI bleeding since then with GI being consulted for Hemoccult positive stool on this admission  Hemoccult positive stool by itself does not signify hemodynamically significant GI bleed  Patient does not have any signs of active GI bleeding, and her hemoglobin is at baseline  Variceal bleeding is expected to cause hematemesis, melena, or other signs of large-volume blood loss, which are not present at this time  Patient syncope occurred during a bowel movement and may have been a vasovagal response.  With no signs of active GI bleeding at home or in the hospital, endoscopic procedures at this time would not change management and would be higher risks than benefits.  She already had banding About 10 days ago electively, and does not need repeat banding at this time  If any evidence of active GI bleeding occurs, please page GI for evaluation again at that time  Continue serial CBCs and transfuse as needed Maintain 2 large-bore IV lines  Patient is already on PPI twice daily Primary team has discontinue octreotide drip since variceal bleeding is unlikely given her clinical symptoms  Patient should follow-up with Dr. Alice Reichert closely, within 1 to 2 weeks of discharge  Thank you for involving me in the care of this patient.      LOS: 1 day   Virgel Manifold, MD  08/01/2019, 3:13 PM

## 2019-08-01 NOTE — Plan of Care (Signed)
  Problem: Activity: Goal: Risk for activity intolerance will decrease Outcome: Progressing   Problem: Elimination: Goal: Will not experience complications related to bowel motility Outcome: Progressing Goal: Will not experience complications related to urinary retention Outcome: Progressing   Problem: Elimination: Goal: Will not experience complications related to urinary retention Outcome: Progressing   Problem: Education: Goal: Knowledge of General Education information will improve Description: Including pain rating scale, medication(s)/side effects and non-pharmacologic comfort measures Outcome: Progressing

## 2019-08-01 NOTE — ED Notes (Signed)
Pt resting with out distress noted, pending hospital bed availability.

## 2019-08-01 NOTE — Progress Notes (Addendum)
PROGRESS NOTE    Theresa [name]  HOZ:[id number] DOB: 14-Jun-1940 DOA: 07/31/2019 PCP: Tracie Harrier, MD   Brief Narrative:  Theresa [name]  is a 79 y.o. female with a known history of type 2 diabetes mellitus, asthma, coronary artery disease, CHF, liver cirrhosis, esophageal varices status post banding on 07/22/2019 by Dr. Alice Reichert, hypertension, dyslipidemia, thrombocytopenia and CVA, who presented to the emergency room with acute onset of syncope while on her commode while having a bowel movement.  The patient was hypotensive by EMS with blood pressure of 76/42 that responded to a bolus of IV fluids and brought her systolic to 704.  She was still having altered mental status with incoherence per her son who is an EMT.  According to him her bowel movement was normal.  She states that she takes iron pills and her stools have been dark but denied any bright red bleeding per rectum or any change in her bowel movements color.  She was admitted for possible GI bleed with positive stool Hemoccult.  Initially started on IV Protonix and octreotide infusions. Discontinued next morning as there was no active bleeding and hemoglobin stable.  GI was consulted from ED.  Subjective: Patient was feeling better when seen today.  She denies any obvious bleeding.  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  Denies any prior dizziness, lightheadedness or palpitation before having a syncopal episode.  She denies any shortness of breath.  She was complaining of right-sided chest pain which feels more like pressure and discomfort for the past 6 to 8 weeks, gradually worsening.  She was also concern about coughing with swallowing of thin liquids.  Assessment & Plan:   Active Problems:   GI bleeding  Syncopal episode.  No obvious etiology, appears vasovagal/neurally mediated as she was having a bowel movement as a  trigger. EKG without any acute change and troponin remains negative.  No obvious arrhythmia.  Patient appears volume down and blood pressure improved with IV fluid. -Check orthostatic vitals.  GI bleeding.  Patient has a recent banding of esophageal varices done on 07/22/2019.  No obvious bleeding.  Hemoglobin on presentation was 10.4, it was 10 this morning with overall decreased in all cell counts most likely dilutional as she received IV fluid.  Hemoglobin was 11 on 04/22/2019 as documented. Hemoccult positive which is chronic. GI was consulted-will appreciate their recommendations. -Discontinue octreotide and Protonix infusions. -Start her on Protonix IV twice daily.  Concern for right-sided basal pneumonia with pleural effusion/right-sided chest discomfort.  CT chest was obtained this morning due to her complaint of worsening right-sided discomfort.  It shows right pleural effusion with some consolidation and compressive atelectasis in the right lower lobe.  Scarring of the left upper lobe.  Procalcitonin was negative and patient remained afebrile with no leukocytosis.  Pleural effusion can be secondary to right-sided abdominal ascites with liver cirrhosis which was also noted on CT chest.  She was given cefepime, vancomycin and azithromycin in the ED and ceftriaxone and azithromycin was continued on admission. -Patient will get benefit with thoracentesis which can be arranged as an outpatient once platelet improves. -Continue Rocephin and azithromycin for now.  UTI ??  Urine with many bacteria.  Urine cultures are pending. No urinary symptoms.  Patient is already on Rocephin. -Follow-up urine cultures.  Dysphagia/concern for aspiration.  Swallow evaluation was obtained which shows low risk for  aspiration.  Patient has GERD. -Continue Protonix.  Liver cirrhosis. -The patient is ammonia levels normal at 20. -We will continue rifaximin.  Type II diabetes mellitus.  -Continue with basal  and SSI.  Hypertension.  Pressure elevated now. -Continue home antihypertensives.  Hypokalemia.  Resolved with replacement.  Magnesium within normal limit.  Hypothyroidism.  TSH done in January 21 was within normal limit. -Continue home dose of Synthroid.  Objective: Vitals:   08/01/19 0857 08/01/19 0900 08/01/19 1000 08/01/19 1100  BP:  127/70 (!) 146/88 (!) 149/71  Pulse:  71 62 61  Resp:  (!) 21 14 17   Temp: 97.8 F (36.6 C)     TempSrc: Oral     SpO2:  94% 96% 95%  Weight:      Height:        Intake/Output Summary (Last 24 hours) at 08/01/2019 1245 Last data filed at 08/01/2019 0723 Gross per 24 hour  Intake --  Output 500 ml  Net -500 ml   Filed Weights   07/31/19 1744  Weight: 73 kg    Examination:  General exam: Appears calm and comfortable  Respiratory system: Clear to auscultation.  Decreased breath sounds at right base ,respiratory effort normal. Cardiovascular system: S1 & S2 heard, RRR. No JVD, murmurs, rubs, gallops or clicks. Gastrointestinal system: Soft, nontender, mildly distended, bowel sounds positive. Central nervous system: Alert and oriented. No focal neurological deficits.Symmetric 5 x 5 power. Extremities: No edema, no cyanosis, pulses intact and symmetrical. Psychiatry: Judgement and insight appear normal. Mood & affect appropriate.    DVT prophylaxis: SCDs Code Status: Full Family Communication: Discussed with patient. Disposition Plan:  Status is: Inpatient  Remains inpatient appropriate because:Inpatient level of care appropriate due to severity of illness   Dispo: The patient is from: Home              Anticipated d/c is to: Home              Anticipated d/c date is: 1 day              Patient currently is not medically stable to d/c.  Consultants:   GI  Procedures:  Antimicrobials:  Rocephin Azithromycin  Data Reviewed: I have personally reviewed following labs and imaging studies  CBC: Recent Labs  Lab 07/31/19 1747  08/01/19 0433  WBC 4.2 3.5*  HGB 10.4* 10.0*  HCT 30.6* 29.7*  MCV 91.1 90.8  PLT 57* 48*   Basic Metabolic Panel: Recent Labs  Lab 07/31/19 1747 08/01/19 0433  NA 137 139  K 3.4* 4.0  CL 103 109  CO2 29 25  GLUCOSE 120* 104*  BUN 19 17  CREATININE 0.96 0.84  CALCIUM 8.4* 7.7*  MG  --  2.4   GFR: Estimated Creatinine Clearance: 46.8 mL/min (by C-G formula based on SCr of 0.84 mg/dL). Liver Function Tests: Recent Labs  Lab 07/31/19 1814 08/01/19 0433  AST 60* 58*  ALT 50* 51*  ALKPHOS 93 80  BILITOT 2.5* 3.0*  PROT 6.7 6.1*  ALBUMIN 3.0* 2.8*   No results for input(s): LIPASE, AMYLASE in the last 168 hours. Recent Labs  Lab 07/31/19 1814  AMMONIA 20   Coagulation Profile: Recent Labs  Lab 07/31/19 1814  INR 1.1   Cardiac Enzymes: No results for input(s): CKTOTAL, CKMB, CKMBINDEX, TROPONINI in the last 168 hours. BNP (last 3 results) No results for input(s): PROBNP in the last 8760 hours. HbA1C: Recent Labs    07/31/19 1747  HGBA1C 5.4  CBG: Recent Labs  Lab 08/01/19 0253 08/01/19 0427 08/01/19 0630 08/01/19 0845 08/01/19 1200  GLUCAP 101* 99 101* 98 138*   Lipid Profile: No results for input(s): CHOL, HDL, LDLCALC, TRIG, CHOLHDL, LDLDIRECT in the last 72 hours. Thyroid Function Tests: No results for input(s): TSH, T4TOTAL, FREET4, T3FREE, THYROIDAB in the last 72 hours. Anemia Panel: No results for input(s): VITAMINB12, FOLATE, FERRITIN, TIBC, IRON, RETICCTPCT in the last 72 hours. Sepsis Labs: Recent Labs  Lab 07/31/19 1814 07/31/19 1838  PROCALCITON  --  <0.10  LATICACIDVEN 1.8  --     Recent Results (from the past 240 hour(s))  Blood Culture (routine x 2)     Status: None (Preliminary result)   Collection Time: 07/31/19  6:54 PM   Specimen: BLOOD  Result Value Ref Range Status   Specimen Description BLOOD BLOOD RIGHT HAND  Final   Special Requests   Final    BOTTLES DRAWN AEROBIC AND ANAEROBIC Blood Culture results may not  be optimal due to an inadequate volume of blood received in culture bottles   Culture   Final    NO GROWTH < 24 HOURS Performed at [facility], [address], [place], [place] [address]    Report Status PENDING  Incomplete  Blood Culture (routine x 2)     Status: None (Preliminary result)   Collection Time: 07/31/19  6:54 PM   Specimen: BLOOD  Result Value Ref Range Status   Specimen Description BLOOD BLOOD RIGHT HAND  Final   Special Requests   Final    BOTTLES DRAWN AEROBIC AND ANAEROBIC Blood Culture results may not be optimal due to an inadequate volume of blood received in culture bottles   Culture   Final    NO GROWTH < 24 HOURS Performed at [facility], [address] [place], [place] [address]    Report Status PENDING  Incomplete  Respiratory Panel by RT PCR (Flu A&B, Covid) - Nasopharyngeal Swab     Status: None   Collection Time: 07/31/19  9:42 PM   Specimen: Nasopharyngeal Swab  Result Value Ref Range Status   SARS Coronavirus 2 by RT PCR NEGATIVE NEGATIVE Final    Comment: (NOTE) SARS-CoV-2 target nucleic acids are NOT DETECTED. The SARS-CoV-2 RNA is generally detectable in upper respiratoy specimens during the acute phase of infection. The lowest concentration of SARS-CoV-2 viral copies this assay can detect is 131 copies/mL. A negative result does not preclude SARS-Cov-2 infection and should not be used as the sole basis for treatment or other patient management decisions. A negative result may occur with  improper specimen collection/handling, submission of specimen other than nasopharyngeal swab, presence of viral mutation(s) within the areas targeted by this assay, and inadequate number of viral copies (<131 copies/mL). A negative result must be combined with clinical observations, patient history, and epidemiological information. The expected result is Negative. Fact Sheet for Patients:  PinkCheek.be Fact  Sheet for Healthcare Providers:  GravelBags.it This test is not yet ap proved or cleared by the [place] FDA and  has been authorized for detection and/or diagnosis of SARS-CoV-2 by FDA under an Emergency Use Authorization (EUA). This EUA will remain  in effect (meaning this test can be used) for the duration of the COVID-19 declaration under Section 564(b)(1) of the Act, 21 U.S.C. section 360bbb-3(b)(1), unless the authorization is terminated or revoked sooner.    Influenza A by PCR NEGATIVE NEGATIVE Final   Influenza B by PCR NEGATIVE NEGATIVE Final  Comment: (NOTE) The Xpert Xpress SARS-CoV-2/FLU/RSV assay is intended as an aid in  the diagnosis of influenza from Nasopharyngeal swab specimens and  should not be used as a sole basis for treatment. Nasal washings and  aspirates are unacceptable for Xpert Xpress SARS-CoV-2/FLU/RSV  testing. Fact Sheet for Patients: PinkCheek.be Fact Sheet for Healthcare Providers: GravelBags.it This test is not yet approved or cleared by the [place] FDA and  has been authorized for detection and/or diagnosis of SARS-CoV-2 by  FDA under an Emergency Use Authorization (EUA). This EUA will remain  in effect (meaning this test can be used) for the duration of the  Covid-19 declaration under Section 564(b)(1) of the Act, 21  U.S.C. section 360bbb-3(b)(1), unless the authorization is  terminated or revoked. Performed at [facility], [address] [place], [place] [address]      Radiology Studies: CT Head Wo Contrast  Result Date: 07/31/2019 CLINICAL DATA:  Neuro deficit, syncope EXAM: CT HEAD WITHOUT CONTRAST TECHNIQUE: Contiguous axial images were obtained from the base of the skull through the vertex without intravenous contrast. COMPARISON:  04/22/2016 FINDINGS: Brain: No evidence of acute infarction, hemorrhage, hydrocephalus, extra-axial  collection or mass lesion/mass effect. Prominence of the lateral ventricles, unchanged since prior examination likely due to global volume loss. Periventricular white matter hypodensity. Vascular: No hyperdense vessel or unexpected calcification. Skull: Normal. Negative for fracture or focal lesion. Sinuses/Orbits: No acute finding. Other: None. IMPRESSION: No acute intracranial pathology. Small-vessel white matter disease and global volume loss. Electronically Signed   By: Eddie Candle M.D.   On: 07/31/2019 18:40   CT CHEST W CONTRAST  Result Date: 08/01/2019 CLINICAL DATA:  Cough and chest pain EXAM: CT CHEST WITH CONTRAST TECHNIQUE: Multidetector CT imaging of the chest was performed during intravenous contrast administration. CONTRAST:  81m OMNIPAQUE IOHEXOL 300 MG/ML  SOLN COMPARISON:  Chest radiograph Jul 31, 2019 and CT chest March 17, 2008 FINDINGS: Cardiovascular: No pulmonary embolus evident. No thoracic aortic aneurysm or dissection. There are scattered foci of calcification in visualized great vessels. There are foci of aortic atherosclerosis. There is calcification in the aortic valve. There are scattered foci of native coronary artery calcification. There is no pericardial effusion or pericardial thickening. Heart is mildly enlarged. 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 Mediastinum/Nodes: Visualized thyroid appears normal. There is no appreciable adenopathy by size criteria. There are scattered subcentimeter mediastinal lymph nodes. There are no appreciable esophageal lesions. Lungs/Pleura: There is a moderate free-flowing pleural effusion on the right. There is consolidation in the right lower lobe with air bronchograms, likely due to a combination of atelectasis and potential superimposed pneumonia. There is scarring in the left upper lobe medially and anteriorly. There is atelectatic change in the left base. Upper Abdomen: The liver has a nodular contour consistent with  cirrhosis. Spleen is incompletely visualized but is noted to be enlarged. Spleen measures 17.0 cm from anterior to posterior dimension. There is upper abdominal aortic atherosclerosis. There is ascites in the upper right abdomen. Musculoskeletal: Status post median sternotomy. There is degenerative change in the thoracic spine. No blastic or lytic bone lesions. No evident chest wall lesions. IMPRESSION: 1. Moderate right pleural effusion with consolidation and compressive atelectasis in the right lower lobe. Atelectasis noted in the left base. Scarring in the left upper lobe anteriorly and medially, stable. 2.  No adenopathy evident. 3. Aortic atherosclerosis. Status post coronary artery bypass grafting. Foci of great vessel and native coronary artery calcification noted. Mild calcification  in the aortic valve also noted. 4. Evidence of hepatic cirrhosis. Splenomegaly noted with spleen incompletely visualized. 5.  There is ascites adjacent to the liver edge. Aortic Atherosclerosis (ICD10-I70.0). Electronically Signed   By: Lowella Grip III M.D.   On: 08/01/2019 09:35   DG Chest Portable 1 View  Result Date: 07/31/2019 CLINICAL DATA:  Altered mental status. Weakness. EXAM: PORTABLE CHEST 1 VIEW COMPARISON:  November 21, 2016 FINDINGS: There is a moderate-sized right-sided pleural effusion with adjacent airspace disease favored to represent atelectasis with an infiltrate not entirely excluded. The heart size is stable. Aortic calcifications are noted. The patient is status post prior median sternotomy. There is no pneumothorax. There is some vascular congestion with possible early interstitial edema. IMPRESSION: 1. Moderate-sized right-sided pleural effusion with adjacent airspace disease favored to represent atelectasis with an infiltrate not entirely excluded. 2. Mild vascular congestion with possible early interstitial edema. Electronically Signed   By: Constance Holster M.D.   On: 07/31/2019 18:14     Scheduled Meds: . vitamin C  500 mg Oral Daily  . calcium-vitamin D  1 tablet Oral BID  . ferrous sulfate  325 mg Oral Daily  . folic acid  160 mcg Oral Daily  . gabapentin  200 mg Oral QHS  . insulin aspart  0-9 Units Subcutaneous Q4H  . insulin detemir  10 Units Subcutaneous QHS  . levothyroxine  50 mcg Oral Daily  . lisinopril  10 mg Oral Daily  . melatonin  5 mg Oral QHS  . multivitamin with minerals  1 tablet Oral Daily  . pantoprazole (PROTONIX) IV  40 mg Intravenous Q12H  . potassium chloride SA  20 mEq Oral BID  . rifaximin  550 mg Oral BID  . simvastatin  20 mg Oral q1800  . torsemide  20 mg Oral BID  . zinc sulfate  220 mg Oral Daily   Continuous Infusions: . 0.9 % NaCl with KCl 20 mEq / L 100 mL/hr at 08/01/19 0716  . azithromycin    . cefTRIAXone (ROCEPHIN)  IV Stopped (08/01/19 0834)     LOS: 1 day   Time spent: 45 minutes.  Lorella Nimrod, MD [facility]  If 7PM-7AM, please contact night-coverage [email or web address]  08/01/2019, 12:45 PM   This record has been created using Systems analyst. Errors have been sought and corrected,but may not always be located. Such creation errors do not reflect on the standard of care.

## 2019-08-01 NOTE — Care Management Important Message (Signed)
Important Message  Patient Details  Name: Theresa [name] MRN: [id number] Date of Birth: 1940-08-09   Medicare Important Message Given:  Yes  Initial Medicare IM given by Patient Access Associate on 08/01/2019 at 12:10am.     Dannette Barbara 08/01/2019, 3:03 PM

## 2019-08-01 NOTE — ED Notes (Signed)
Pt given drink and ice as requested. Husband at bedside. Awaiting lunch tray.

## 2019-08-01 NOTE — ED Notes (Signed)
Pt resting comfotably with eyes closed, no distress noted.

## 2019-08-01 NOTE — Evaluation (Signed)
Clinical/Bedside Swallow Evaluation Patient Details  Name: Theresa [name] MRN: [id number] Date of Birth: [birth year]  Today's Date: 08/01/2019 Time: SLP Start Time (ACUTE ONLY): 1000 SLP Stop Time (ACUTE ONLY): 1100 SLP Time Calculation (min) (ACUTE ONLY): 60 min  Past Medical History:  Past Medical History:  Diagnosis Date  . Anemia   . Arthritis   . Asthma   . Bradycardia   . CAD (coronary artery disease)    BIL.CAROTID ARTERY STENOSIS  . CHF (congestive heart failure) ([place])   . Chronic low back pain   . 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 Colon polyps   . Diabetes mellitus without complication ([place])   . Dyspnea   . Esophageal varices without bleeding ([place])   . GERD (gastroesophageal reflux disease)   . Hip pain   . Hyperlipidemia   . Hypertension   . IDA (iron deficiency anemia)   . 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 Sleep apnea   . SOB (shortness of breath)   . Stroke ([place])   . Temporal arteritis ([place])   . Thrombocytopenia ([place])   . Thrombocytopenia ([place])    Past Surgical History:  Past Surgical History:  Procedure Laterality Date  . COLONOSCOPY  10/24/2013, 2002   hyperplastic polpys  . CORONARY ANGIOPLASTY    . CORONARY ARTERY BYPASS GRAFT  09/1998  . ESOPHAGOGASTRODUODENOSCOPY (EGD) WITH PROPOFOL N/A 08/26/2016   Procedure: ESOPHAGOGASTRODUODENOSCOPY (EGD) WITH PROPOFOL;  Surgeon: Manya Silvas, MD;  Location: [facility] ENDOSCOPY;  Service: Endoscopy;  Laterality: N/A;  . ESOPHAGOGASTRODUODENOSCOPY (EGD) WITH PROPOFOL N/A 02/05/2018   Procedure: ESOPHAGOGASTRODUODENOSCOPY (EGD) WITH PROPOFOL;  Surgeon: Manya Silvas, MD;  Location: [facility] ENDOSCOPY;  Service: Endoscopy;  Laterality: N/A;  . ESOPHAGOGASTRODUODENOSCOPY (EGD) WITH PROPOFOL N/A 07/22/2019   Procedure: ESOPHAGOGASTRODUODENOSCOPY (EGD) WITH PROPOFOL;  Surgeon: [place], Benay Pike, MD;  Location: [facility] ENDOSCOPY;  Service: Gastroenterology;  Laterality: N/A;  .  ESOPHAGOGASTRODUODENOSCOPY ENDOSCOPY  05/24/2011  . hemorroid surgery    . JOINT REPLACEMENT  2003  2005   bil.knees   HPI:  Pt is a 79 y.o. female with a known history of GERD on PPI, type 2 diabetes mellitus, asthma, coronary artery disease, CHF, liver cirrhosis, esophageal varices status post banding on 07/22/2019 by Dr. Alice Reichert, hypertension, dyslipidemia, thrombocytopenia and CVA, who presented to the emergency room with acute onset of syncope while on her commode while having a bowel movement.  The patient was hypotensive by EMS with blood pressure of 76/42 that responded to a bolus of IV fluids and brought her systolic to 903.  She was still having altered mental status with incoherence per her son who is an EMT.  Chest x-ray showed her midline sternotomy, moderate sized right-sided pleural effusion with adjacent airspace disease favored to represent Atelectasis without infiltrate not entirely excluded mild vascular congestion with possible early interstitial edema.    Assessment / Plan / Recommendation Clinical Impression  Pt appears to present w/ adequate oropharyngeal phase swallow function w/ No oropharyngeal phase dysphagia noted, No Neuromuscular deficits impacting swallowing.Pt consumed po trials w/ No overt, clinical s/s of aspiration during po trials. She appears at reduced risk for aspiration following general aspiration precautions. However, pt does have GERD and any Esophageal dysmotility can impact the pharyngeal phase of swallowing. She is on a PPI daily per her report. During po trials, pt consumed all consistencies w/ no overt coughing, decline in vocal quality, or change in respiratory presentation during/post trials. Oral phase appeared  [facility] w/ timely bolus management, mastication, and control of bolus propulsion for A-P transfer for swallowing. Oral clearing achieved w/ all trial consistencies. OM Exam appeared grossly [facility] w/ no unilateral weakness noted. Speech Clear and  Intelligible. Pt fed self w/ setup support. Recommend a Regular consistency diet w/ Cut meats, moistened foods; Thin liquids VIA CUP - pt does Not use Straws at home baseline either. Recommend general aspiration precautions, Pills WHOLE in Puree for safer, easier swallowing as needed and to reduce risk for aspiration. Education given on Pills in Puree; food consistencies and easy to eat options; general aspiration precautions to include NOT TALKING when eating/drinking(meals) unless taking a rest break, and giving Focus to Small, Single sips when drinking liquids. NSG to reconsult if any new needs arise. NSG agreed. Pt/husband agreed. SLP Visit Diagnosis: Dysphagia, pharyngoesophageal phase (R13.14)    Aspiration Risk  Mild but Reduced aspiration risk(reduced following precautions)    Diet Recommendation  Regular diet w/ cut/moistened foods for ease and for Esophageal clearing; Thin liquids VIA CUP ONLY. Aspiration and Reflux precautions. REDUCE distractions and talking during meals, drinking of liquids.   Medication Administration: Whole meds with puree(for safer swallowing)    Other  Recommendations Recommended Consults: Consider GI evaluation(ongoing management of GERD, PPI; Dietician) Oral Care Recommendations: Oral care BID;Oral care before and after PO;Patient independent with oral care Other Recommendations: (n/a)   Follow up Recommendations None      Frequency and Duration (n/a)  (n/a)       Prognosis Prognosis for Safe Diet Advancement: Good Barriers to Reach Goals: Behavior;Time post onset(eating/drinking behaviors; GERD)      Swallow Study   General Date of Onset: 07/31/19 HPI: Pt is a 79 y.o. female with a known history of GERD on PPI, type 2 diabetes mellitus, asthma, coronary artery disease, CHF, liver cirrhosis, esophageal varices status post banding on 07/22/2019 by Dr. Alice Reichert, hypertension, dyslipidemia, thrombocytopenia and CVA, who presented to the emergency room with  acute onset of syncope while on her commode while having a bowel movement.  The patient was hypotensive by EMS with blood pressure of 76/42 that responded to a bolus of IV fluids and brought her systolic to 810.  She was still having altered mental status with incoherence per her son who is an EMT.  Chest x-ray showed her midline sternotomy, moderate sized right-sided pleural effusion with adjacent airspace disease favored to represent Atelectasis without infiltrate not entirely excluded mild vascular congestion with possible early interstitial edema.  Type of Study: Bedside Swallow Evaluation Previous Swallow Assessment: none Diet Prior to this Study: NPO(regular diet at home) Temperature Spikes Noted: No(wbc 3.5) Respiratory Status: Room air History of Recent Intubation: No Behavior/Cognition: Alert;Cooperative;Pleasant mood Oral Cavity Assessment: Dry(sticky) Oral Care Completed by SLP: Yes Oral Cavity - Dentition: Adequate natural dentition Vision: Functional for self-feeding Self-Feeding Abilities: Able to feed self;Needs set up Patient Positioning: Upright in bed(needed support and positioning) Baseline Vocal Quality: Normal Volitional Cough: Strong Volitional Swallow: Able to elicit    Oral/Motor/Sensory Function Overall Oral Motor/Sensory Function: Within functional limits   Ice Chips Ice chips: Within functional limits Presentation: Spoon(fed; 2 trials)   Thin Liquid Thin Liquid: Within functional limits Presentation: Cup;Self Fed(~8 ozs+) Other Comments: pt took her Time    Nectar Thick Nectar Thick Liquid: Not tested   Honey Thick Honey Thick Liquid: Not tested   Puree Puree: Within functional limits Presentation: Spoon;Self Fed(8 trials)   Solid     Solid: Within functional limits Presentation: Self  Fed(4 crackers)       Orinda Kenner, MS, CCC-SLP Adlene Adduci 08/01/2019,11:32 AM

## 2019-08-02 DIAGNOSIS — N390 Urinary tract infection, site not specified: Secondary | ICD-10-CM

## 2019-08-02 LAB — CBC
HCT: 31.2 % — ABNORMAL LOW (ref 36.0–46.0)
Hemoglobin: 10.9 g/dL — ABNORMAL LOW (ref 12.0–15.0)
MCH: 31.5 pg (ref 26.0–34.0)
MCHC: 34.9 g/dL (ref 30.0–36.0)
MCV: 90.2 fL (ref 80.0–100.0)
Platelets: 57 10*3/uL — ABNORMAL LOW (ref 150–400)
RBC: 3.46 MIL/uL — ABNORMAL LOW (ref 3.87–5.11)
RDW: 16.8 % — ABNORMAL HIGH (ref 11.5–15.5)
WBC: 3.6 10*3/uL — ABNORMAL LOW (ref 4.0–10.5)
nRBC: 0 % (ref 0.0–0.2)

## 2019-08-02 LAB — BASIC METABOLIC PANEL
Anion gap: 7 (ref 5–15)
BUN: 17 mg/dL (ref 8–23)
CO2: 27 mmol/L (ref 22–32)
Calcium: 8 mg/dL — ABNORMAL LOW (ref 8.9–10.3)
Chloride: 106 mmol/L (ref 98–111)
Creatinine, Ser: 1.02 mg/dL — ABNORMAL HIGH (ref 0.44–1.00)
GFR calc Af Amer: 60 mL/min — ABNORMAL LOW (ref >60)
GFR calc non Af Amer: 52 mL/min — ABNORMAL LOW (ref >60)
Glucose, Bld: 101 mg/dL — ABNORMAL HIGH (ref 70–99)
Potassium: 3.6 mmol/L (ref 3.5–5.1)
Sodium: 140 mmol/L (ref 135–145)

## 2019-08-02 LAB — GLUCOSE, CAPILLARY
Glucose-Capillary: 98 mg/dL (ref 70–99)
Glucose-Capillary: 105 mg/dL — ABNORMAL HIGH (ref 70–99)

## 2019-08-02 LAB — URINE CULTURE: Culture: >=100000 — AB

## 2019-08-02 LAB — TSH: TSH: 1.133 u[IU]/mL (ref 0.350–4.500)

## 2019-08-02 MED ORDER — AMOXICILLIN-POT CLAVULANATE 500-125 MG PO TABS: 1.0000 | ORAL_TABLET | Freq: Two times a day (BID) | ORAL | 0 refills | Status: AC

## 2019-08-02 MED ORDER — INSULIN DETEMIR 100 UNIT/ML FLEXPEN: 20.0000 [IU] | Freq: Every day | SUBCUTANEOUS | 2 refills | Status: DC

## 2019-08-02 MED ORDER — AMOXICILLIN-POT CLAVULANATE 875-125 MG PO TABS: 1.0000 | ORAL_TABLET | Freq: Two times a day (BID) | ORAL | Status: DC

## 2019-08-02 MED ORDER — AMOXICILLIN-POT CLAVULANATE 500-125 MG PO TABS
1.0000 | ORAL_TABLET | Freq: Two times a day (BID) | ORAL | Status: DC
  Administered 2019-08-02: 500 mg via ORAL
  Filled 2019-08-02 (×2): qty 1

## 2019-08-02 MED ORDER — SULFAMETHOXAZOLE-TRIMETHOPRIM 800-160 MG PO TABS
1.0000 | ORAL_TABLET | Freq: Two times a day (BID) | ORAL | Status: DC
  Filled 2019-08-02: qty 1

## 2019-08-02 NOTE — Progress Notes (Signed)
Theresa [name] to be D/C'd Home per MD order.  Discussed prescriptions and follow up appointments with the patient. Prescriptions were e-prescribed, medication list explained in detail. Pt verbalized understanding.  Allergies as of 08/02/2019      Reactions   Morphine And Related Nausea And Vomiting   Other reaction(s): Nausea And Vomiting   Adhesive [tape] Rash   Other Rash   NEOPRENE, TAPE, BAND-AID TOUGH STRIPS      Medication List    TAKE these medications   alendronate 70 MG tablet Commonly known as: FOSAMAX Take 70 mg by mouth every Monday. Take with a full glass of water on an empty stomach.   amoxicillin-clavulanate 500-125 MG tablet Commonly known as: AUGMENTIN Take 1 tablet (500 mg total) by mouth 2 (two) times daily for 7 days.   aspirin EC 81 MG tablet Take 81 mg by mouth daily.   blood glucose meter kit and supplies Kit Dispense based on patient and insurance preference. Use up to four times daily as directed. (FOR ICD-9 250.00, 250.01).   calcium-vitamin D 500-200 MG-UNIT tablet Take 1 tablet by mouth 2 (two) times daily.   Cranberry-Milk Thistle 250-75 MG Caps Take 1 capsule by mouth daily.   ferrous sulfate 325 (65 FE) MG tablet Take 325 mg by mouth daily.   folic acid 741 MCG tablet Commonly known as: FOLVITE Take 400 mcg by mouth daily.   gabapentin 100 MG capsule Commonly known as: NEURONTIN Take 200 mg by mouth at bedtime.   insulin detemir 100 unit/ml Soln Commonly known as: LEVEMIR Inject 0.2 mLs (20 Units total) into the skin at bedtime.   levothyroxine 50 MCG tablet Commonly known as: SYNTHROID Take 50 mcg by mouth daily before breakfast.   lisinopril 10 MG tablet Commonly known as: ZESTRIL Take 10 mg by mouth daily.   meclizine 12.5 MG tablet Commonly known as: ANTIVERT Take 12.5 mg by mouth 3 (three) times daily as needed for dizziness.   Melatonin 10 MG Caps Take 10 mg by mouth at bedtime.   metFORMIN 500 MG 24 hr  tablet Commonly known as: GLUCOPHAGE-XR Take 1,000 mg by mouth daily with supper.   Multi-Vitamins Tabs Take 1 tablet by mouth daily.   omeprazole 20 MG capsule Commonly known as: PRILOSEC Take 20 mg by mouth daily.   potassium chloride SA 20 MEQ tablet Commonly known as: KLOR-CON Take 1 tablet (20 mEq total) by mouth 2 (two) times daily.   simvastatin 20 MG tablet Commonly known as: ZOCOR Take 20 mg by mouth at bedtime.   torsemide 20 MG tablet Commonly known as: DEMADEX Take 20 mg by mouth 2 (two) times daily.   vitamin C 500 MG tablet Commonly known as: ASCORBIC ACID Take 500 mg by mouth daily.   Xifaxan 550 MG Tabs tablet Generic drug: rifaximin Take 550 mg by mouth 2 (two) times daily.   zinc gluconate 50 MG tablet Take 50 mg by mouth daily.       Vitals:   08/02/19 0406 08/02/19 0745  BP: 111/62 122/65  Pulse: 63 60  Resp: 18 16  Temp: 98.6 F (37 C) 98.3 F (36.8 C)  SpO2: 94% 95%    Tele box removed and returned.Skin clean, dry and intact without evidence of skin break down, no evidence of skin tears noted. IV catheter discontinued intact. Site without signs and symptoms of complications. Dressing and pressure applied. Pt denies pain at this time. No complaints noted.  An After Visit Summary was  printed and given to the patient. Patient escorted via [place], and D/C home via private auto.  Rolley Sims

## 2019-08-02 NOTE — Discharge Summary (Signed)
Physician Discharge Summary  Theresa [name] DQQ:[id number] DOB: 01-Dec-1940 DOA: 07/31/2019  PCP: Tracie Harrier, MD  Admit date: 07/31/2019 Discharge date: 08/02/2019  Admitted From: Home Disposition:  Home  Recommendations for Outpatient Follow-up:  1. Follow up with PCP in 1-2 weeks 2. Follow-up with gastroenterology 3. Please obtain BMP/CBC in one week 4. Please follow up on the following pending results:None  Home Health:No Equipment/Devices: None Discharge Condition:  CODE STATUS:  Diet recommendation: Heart Healthy/carb modified  Brief/Interim Summary: JudyHolmesis a79 y.o.femalewith a known history oftype 2 diabetes mellitus, asthma, coronary artery disease, CHF, liver cirrhosis, esophageal varices status post banding on 07/22/2019 by Dr. Alice Reichert, hypertension, dyslipidemia, thrombocytopenia and CVA, who presented to the emergency room with acute onset of syncope while on her commodewhile having a bowel movement. The patient was hypotensive by EMS withblood pressure of 76/42that responded to a bolus of IV fluidsand brought her systolic to 194. She was still having altered mental status with incoherence per her son who is an EMT.According to him her bowel movement was normal. She states that she takes iron pills and her stools have been dark but denied any bright red bleeding per rectum or any change in her bowel movements color.  She was admitted for possible GI bleed with positive stool Hemoccult.  Which seems chronic.  Hemoglobin on discharge day was 10.9.   Initially started on IV Protonix and octreotide infusions. Discontinued next morning as there was no active bleeding and hemoglobin stable.  GI saw her and there was no need for any acute intervention at this time.   Syncopal episode was most likely is a vasovagal as she was having a bowel movement.   Her urine culture grew ESBL E. coli with sensitivity to Augmentin and Bactrim.  She was given a 7-day course of  Augmentin and will follow up with her primary care physician for further management.  Patient was complaining of right-sided chest pressure going on for few weeks now.  CT chest was obtained which shows right pleural effusion with some consolidation and compressive atelectasis in the right lower lobe along with scarring of the left upper lobe.  Procalcitonin was negative and patient remained afebrile with no leukocytosis. Pleural effusion can be secondary to right-sided abdominal ascites with liver cirrhosis which was also noted on CT chest.  She was given cefepime, vancomycin and azithromycin in the ED and ceftriaxone and azithromycin was continued on admission.Patient will get benefit with thoracentesis which can be arranged as an outpatient once platelet improves.  Swallow evaluation was obtained with her concern of dysphagia/aspiration and studies shows low risk for aspiration.  Patient had GERD and will continue with PPI.  Patient's ammonia level was normal at 20 on admission.  No acute concerns and she will continue home dose of rifaximin.  We will continue rest of her home meds.  Discharge Diagnoses:  Active Problems:   GI bleeding   Acute lower UTI  Discharge Instructions  Discharge Instructions    Diet - low sodium heart healthy   Complete by: As directed    Discharge instructions   Complete by: As directed    It was pleasure taking care of you. I am giving you antibiotics Augmentin for your urinary tract infection to be used for 7 days. Please follow-up with your primary care physician and gastroenterologist.   Increase activity slowly   Complete by: As directed      Allergies as of 08/02/2019      Reactions   Morphine  And Related Nausea And Vomiting   Other reaction(s): Nausea And Vomiting   Adhesive [tape] Rash   Other Rash   NEOPRENE, TAPE, BAND-AID TOUGH STRIPS      Medication List    TAKE these medications   alendronate 70 MG tablet Commonly known as:  FOSAMAX Take 70 mg by mouth every Monday. Take with a full glass of water on an empty stomach.   amoxicillin-clavulanate 500-125 MG tablet Commonly known as: AUGMENTIN Take 1 tablet (500 mg total) by mouth 2 (two) times daily for 7 days.   aspirin EC 81 MG tablet Take 81 mg by mouth daily.   blood glucose meter kit and supplies Kit Dispense based on patient and insurance preference. Use up to four times daily as directed. (FOR ICD-9 250.00, 250.01).   calcium-vitamin D 500-200 MG-UNIT tablet Take 1 tablet by mouth 2 (two) times daily.   Cranberry-Milk Thistle 250-75 MG Caps Take 1 capsule by mouth daily.   ferrous sulfate 325 (65 FE) MG tablet Take 325 mg by mouth daily.   folic acid 202 MCG tablet Commonly known as: FOLVITE Take 400 mcg by mouth daily.   gabapentin 100 MG capsule Commonly known as: NEURONTIN Take 200 mg by mouth at bedtime.   insulin detemir 100 unit/ml Soln Commonly known as: LEVEMIR Inject 0.2 mLs (20 Units total) into the skin at bedtime.   levothyroxine 50 MCG tablet Commonly known as: SYNTHROID Take 50 mcg by mouth daily before breakfast.   lisinopril 10 MG tablet Commonly known as: ZESTRIL Take 10 mg by mouth daily.   meclizine 12.5 MG tablet Commonly known as: ANTIVERT Take 12.5 mg by mouth 3 (three) times daily as needed for dizziness.   Melatonin 10 MG Caps Take 10 mg by mouth at bedtime.   metFORMIN 500 MG 24 hr tablet Commonly known as: GLUCOPHAGE-XR Take 1,000 mg by mouth daily with supper.   Multi-Vitamins Tabs Take 1 tablet by mouth daily.   omeprazole 20 MG capsule Commonly known as: PRILOSEC Take 20 mg by mouth daily.   potassium chloride SA 20 MEQ tablet Commonly known as: KLOR-CON Take 1 tablet (20 mEq total) by mouth 2 (two) times daily.   simvastatin 20 MG tablet Commonly known as: ZOCOR Take 20 mg by mouth at bedtime.   torsemide 20 MG tablet Commonly known as: DEMADEX Take 20 mg by mouth 2 (two) times  daily.   vitamin C 500 MG tablet Commonly known as: ASCORBIC ACID Take 500 mg by mouth daily.   Xifaxan 550 MG Tabs tablet Generic drug: rifaximin Take 550 mg by mouth 2 (two) times daily.   zinc gluconate 50 MG tablet Take 50 mg by mouth daily.      Follow-up Information    Tracie Harrier, MD. Schedule an appointment as soon as possible for a visit.   Specialty: Internal Medicine Contact information: [address] [place] [address] [phone]          Allergies  Allergen Reactions  . Morphine And Related Nausea And Vomiting    Other reaction(s): Nausea And Vomiting  . Adhesive [Tape] Rash  . Other Rash    NEOPRENE, TAPE, BAND-AID TOUGH STRIPS    Consultations:  GI  Procedures/Studies: CT Head Wo Contrast  Result Date: 07/31/2019 CLINICAL DATA:  Neuro deficit, syncope EXAM: CT HEAD WITHOUT CONTRAST TECHNIQUE: Contiguous axial images were obtained from the base of the skull through the vertex without intravenous contrast. COMPARISON:  04/22/2016 FINDINGS: Brain: No evidence of  acute infarction, hemorrhage, hydrocephalus, extra-axial collection or mass lesion/mass effect. Prominence of the lateral ventricles, unchanged since prior examination likely due to global volume loss. Periventricular white matter hypodensity. Vascular: No hyperdense vessel or unexpected calcification. Skull: Normal. Negative for fracture or focal lesion. Sinuses/Orbits: No acute finding. Other: None. IMPRESSION: No acute intracranial pathology. Small-vessel white matter disease and global volume loss. Electronically Signed   By: Eddie Candle M.D.   On: 07/31/2019 18:40   CT CHEST W CONTRAST  Result Date: 08/01/2019 CLINICAL DATA:  Cough and chest pain EXAM: CT CHEST WITH CONTRAST TECHNIQUE: Multidetector CT imaging of the chest was performed during intravenous contrast administration. CONTRAST:  77m OMNIPAQUE IOHEXOL 300 MG/ML  SOLN COMPARISON:  Chest radiograph Jul 31, 2019 and CT  chest March 17, 2008 FINDINGS: Cardiovascular: No pulmonary embolus evident. No thoracic aortic aneurysm or dissection. There are scattered foci of calcification in visualized great vessels. There are foci of aortic atherosclerosis. There is calcification in the aortic valve. There are scattered foci of native coronary artery calcification. There is no pericardial effusion or pericardial thickening. Heart is mildly enlarged. 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 Mediastinum/Nodes: Visualized thyroid appears normal. There is no appreciable adenopathy by size criteria. There are scattered subcentimeter mediastinal lymph nodes. There are no appreciable esophageal lesions. Lungs/Pleura: There is a moderate free-flowing pleural effusion on the right. There is consolidation in the right lower lobe with air bronchograms, likely due to a combination of atelectasis and potential superimposed pneumonia. There is scarring in the left upper lobe medially and anteriorly. There is atelectatic change in the left base. Upper Abdomen: The liver has a nodular contour consistent with cirrhosis. Spleen is incompletely visualized but is noted to be enlarged. Spleen measures 17.0 cm from anterior to posterior dimension. There is upper abdominal aortic atherosclerosis. There is ascites in the upper right abdomen. Musculoskeletal: Status post median sternotomy. There is degenerative change in the thoracic spine. No blastic or lytic bone lesions. No evident chest wall lesions. IMPRESSION: 1. Moderate right pleural effusion with consolidation and compressive atelectasis in the right lower lobe. Atelectasis noted in the left base. Scarring in the left upper lobe anteriorly and medially, stable. 2.  No adenopathy evident. 3. Aortic atherosclerosis. Status post coronary artery bypass grafting. Foci of great vessel and native coronary artery calcification noted. Mild calcification in the aortic valve also noted. 4.  Evidence of hepatic cirrhosis. Splenomegaly noted with spleen incompletely visualized. 5.  There is ascites adjacent to the liver edge. Aortic Atherosclerosis (ICD10-I70.0). Electronically Signed   By: WLowella GripIII M.D.   On: 08/01/2019 09:35   DG Chest Portable 1 View  Result Date: 07/31/2019 CLINICAL DATA:  Altered mental status. Weakness. EXAM: PORTABLE CHEST 1 VIEW COMPARISON:  November 21, 2016 FINDINGS: There is a moderate-sized right-sided pleural effusion with adjacent airspace disease favored to represent atelectasis with an infiltrate not entirely excluded. The heart size is stable. Aortic calcifications are noted. The patient is status post prior median sternotomy. There is no pneumothorax. There is some vascular congestion with possible early interstitial edema. IMPRESSION: 1. Moderate-sized right-sided pleural effusion with adjacent airspace disease favored to represent atelectasis with an infiltrate not entirely excluded. 2. Mild vascular congestion with possible early interstitial edema. Electronically Signed   By: CConstance HolsterM.D.   On: 07/31/2019 18:14     Subjective: Patient has no new complaints today.  Denies any bleeding.  She was accompanied by her husband in the room and  would like to go back home.  Discharge Exam: Vitals:   08/02/19 0406 08/02/19 0745  BP: 111/62 122/65  Pulse: 63 60  Resp: 18 16  Temp: 98.6 F (37 C) 98.3 F (36.8 C)  SpO2: 94% 95%   Vitals:   08/02/19 0000 08/02/19 0400 08/02/19 0406 08/02/19 0745  BP:   111/62 122/65  Pulse:   63 60  Resp:   18 16  Temp: 97.6 F (36.4 C) 97.7 F (36.5 C) 98.6 F (37 C) 98.3 F (36.8 C)  TempSrc: Oral Oral Oral Oral  SpO2:   94% 95%  Weight:      Height:        General: Pt is alert, awake, not in acute distress Cardiovascular: RRR, S1/S2 +, no rubs, no gallops Respiratory: CTA bilaterally, no wheezing, no rhonchi Abdominal: Soft, NT, ND, bowel sounds + Extremities: no edema, no  cyanosis   The results of significant diagnostics from this hospitalization (including imaging, microbiology, ancillary and laboratory) are listed below for reference.    Microbiology: Recent Results (from the past 240 hour(s))  Urine Culture     Status: Abnormal   Collection Time: 07/31/19  6:14 PM   Specimen: Urine, Random  Result Value Ref Range Status   Specimen Description   Final    URINE, RANDOM Performed at [facility], [address], [place], [place] [address]    Special Requests   Final    NONE Performed at [facility], [address] [place], [place] [address]    Culture (A)  Final    >=100,000 COLONIES/mL ESCHERICHIA COLI Confirmed Extended Spectrum Beta-Lactamase Producer (ESBL).  In bloodstream infections from ESBL organisms, carbapenems are preferred over piperacillin/tazobactam. They are shown to have a lower risk of mortality.    Report Status 08/02/2019 FINAL  Final   Organism ID, Bacteria ESCHERICHIA COLI (A)  Final      Susceptibility   Escherichia coli - MIC*    AMPICILLIN >=32 RESISTANT Resistant     CEFAZOLIN >=64 RESISTANT Resistant     CEFTRIAXONE RESISTANT Resistant     CIPROFLOXACIN >=4 RESISTANT Resistant     GENTAMICIN <=1 SENSITIVE Sensitive     IMIPENEM 0.5 SENSITIVE Sensitive     NITROFURANTOIN <=16 SENSITIVE Sensitive     TRIMETH/SULFA <=20 SENSITIVE Sensitive     AMPICILLIN/SULBACTAM 8 SENSITIVE Sensitive     PIP/TAZO <=4 SENSITIVE Sensitive     * >=100,000 COLONIES/mL ESCHERICHIA COLI  Blood Culture (routine x 2)     Status: None (Preliminary result)   Collection Time: 07/31/19  6:54 PM   Specimen: BLOOD  Result Value Ref Range Status   Specimen Description BLOOD BLOOD RIGHT HAND  Final   Special Requests   Final    BOTTLES DRAWN AEROBIC AND ANAEROBIC Blood Culture results may not be optimal due to an inadequate volume of blood received in culture bottles   Culture   Final    NO GROWTH 2 DAYS Performed at  [facility], [address] [place], [place] [address]    Report Status PENDING  Incomplete  Blood Culture (routine x 2)     Status: None (Preliminary result)   Collection Time: 07/31/19  6:54 PM   Specimen: BLOOD  Result Value Ref Range Status   Specimen Description BLOOD BLOOD RIGHT HAND  Final   Special Requests   Final    BOTTLES DRAWN AEROBIC AND ANAEROBIC Blood Culture results may not be optimal due to an inadequate  volume of blood received in culture bottles   Culture   Final    NO GROWTH 2 DAYS Performed at [facility], [address] [place], [place] [address]    Report Status PENDING  Incomplete  Respiratory Panel by RT PCR (Flu A&B, Covid) - Nasopharyngeal Swab     Status: None   Collection Time: 07/31/19  9:42 PM   Specimen: Nasopharyngeal Swab  Result Value Ref Range Status   SARS Coronavirus 2 by RT PCR NEGATIVE NEGATIVE Final    Comment: (NOTE) SARS-CoV-2 target nucleic acids are NOT DETECTED. The SARS-CoV-2 RNA is generally detectable in upper respiratoy specimens during the acute phase of infection. The lowest concentration of SARS-CoV-2 viral copies this assay can detect is 131 copies/mL. A negative result does not preclude SARS-Cov-2 infection and should not be used as the sole basis for treatment or other patient management decisions. A negative result may occur with  improper specimen collection/handling, submission of specimen other than nasopharyngeal swab, presence of viral mutation(s) within the areas targeted by this assay, and inadequate number of viral copies (<131 copies/mL). A negative result must be combined with clinical observations, patient history, and epidemiological information. The expected result is Negative. Fact Sheet for Patients:  PinkCheek.be Fact Sheet for Healthcare Providers:  GravelBags.it This test is not yet ap proved or cleared by the [place]  FDA and  has been authorized for detection and/or diagnosis of SARS-CoV-2 by FDA under an Emergency Use Authorization (EUA). This EUA will remain  in effect (meaning this test can be used) for the duration of the COVID-19 declaration under Section 564(b)(1) of the Act, 21 U.S.C. section 360bbb-3(b)(1), unless the authorization is terminated or revoked sooner.    Influenza A by PCR NEGATIVE NEGATIVE Final   Influenza B by PCR NEGATIVE NEGATIVE Final    Comment: (NOTE) The Xpert Xpress SARS-CoV-2/FLU/RSV assay is intended as an aid in  the diagnosis of influenza from Nasopharyngeal swab specimens and  should not be used as a sole basis for treatment. Nasal washings and  aspirates are unacceptable for Xpert Xpress SARS-CoV-2/FLU/RSV  testing. Fact Sheet for Patients: PinkCheek.be Fact Sheet for Healthcare Providers: GravelBags.it This test is not yet approved or cleared by the [place] FDA and  has been authorized for detection and/or diagnosis of SARS-CoV-2 by  FDA under an Emergency Use Authorization (EUA). This EUA will remain  in effect (meaning this test can be used) for the duration of the  Covid-19 declaration under Section 564(b)(1) of the Act, 21  U.S.C. section 360bbb-3(b)(1), unless the authorization is  terminated or revoked. Performed at [facility], [address] [place], [place] [address]      Labs: BNP (last 3 results) No results for input(s): BNP in the last 8760 hours. Basic Metabolic Panel: Recent Labs  Lab 07/31/19 1747 08/01/19 0433 08/02/19 0415  NA 137 139 140  K 3.4* 4.0 3.6  CL 103 109 106  CO2 29 25 27   GLUCOSE 120* 104* 101*  BUN 19 17 17   CREATININE 0.96 0.84 1.02*  CALCIUM 8.4* 7.7* 8.0*  MG  --  2.4  --    Liver Function Tests: Recent Labs  Lab 07/31/19 1814 08/01/19 0433  AST 60* 58*  ALT 50* 51*  ALKPHOS 93 80  BILITOT 2.5* 3.0*  PROT 6.7 6.1*  ALBUMIN  3.0* 2.8*   No results for input(s): LIPASE, AMYLASE in the last 168 hours. Recent Labs  Lab 07/31/19 1814  AMMONIA  20   CBC: Recent Labs  Lab 07/31/19 1747 08/01/19 0433 08/02/19 0415  WBC 4.2 3.5* 3.6*  HGB 10.4* 10.0* 10.9*  HCT 30.6* 29.7* 31.2*  MCV 91.1 90.8 90.2  PLT 57* 48* 57*   Cardiac Enzymes: No results for input(s): CKTOTAL, CKMB, CKMBINDEX, TROPONINI in the last 168 hours. BNP: Invalid input(s): POCBNP CBG: Recent Labs  Lab 08/01/19 1200 08/01/19 1700 08/01/19 2137 08/02/19 0407 08/02/19 0744  GLUCAP 138* 109* 175* 98 105*   D-Dimer No results for input(s): DDIMER in the last 72 hours. Hgb A1c Recent Labs    07/31/19 1747  HGBA1C 5.4   Lipid Profile No results for input(s): CHOL, HDL, LDLCALC, TRIG, CHOLHDL, LDLDIRECT in the last 72 hours. Thyroid function studies Recent Labs    08/02/19 0415  TSH 1.133   Anemia work up No results for input(s): VITAMINB12, FOLATE, FERRITIN, TIBC, IRON, RETICCTPCT in the last 72 hours. Urinalysis    Component Value Date/Time   COLORURINE YELLOW (A) 07/31/2019 1814   APPEARANCEUR CLEAR (A) 07/31/2019 1814   APPEARANCEUR Hazy 01/29/2014 2131   LABSPEC 1.006 07/31/2019 1814   LABSPEC 1.005 01/29/2014 2131   PHURINE 7.0 07/31/2019 1814   GLUCOSEU NEGATIVE 07/31/2019 1814   GLUCOSEU Negative 01/29/2014 2131   HGBUR NEGATIVE 07/31/2019 1814   BILIRUBINUR NEGATIVE 07/31/2019 1814   BILIRUBINUR Negative 01/29/2014 2131   KETONESUR NEGATIVE 07/31/2019 1814   PROTEINUR NEGATIVE 07/31/2019 1814   NITRITE NEGATIVE 07/31/2019 1814   LEUKOCYTESUR NEGATIVE 07/31/2019 1814   LEUKOCYTESUR 3+ 01/29/2014 2131   Sepsis Labs Invalid input(s): PROCALCITONIN,  WBC,  LACTICIDVEN Microbiology Recent Results (from the past 240 hour(s))  Urine Culture     Status: Abnormal   Collection Time: 07/31/19  6:14 PM   Specimen: Urine, Random  Result Value Ref Range Status   Specimen Description   Final    URINE,  RANDOM Performed at [facility], [address], [place], [place] [address]    Special Requests   Final    NONE Performed at [facility], [address] [place], [place] [address]    Culture (A)  Final    >=100,000 COLONIES/mL ESCHERICHIA COLI Confirmed Extended Spectrum Beta-Lactamase Producer (ESBL).  In bloodstream infections from ESBL organisms, carbapenems are preferred over piperacillin/tazobactam. They are shown to have a lower risk of mortality.    Report Status 08/02/2019 FINAL  Final   Organism ID, Bacteria ESCHERICHIA COLI (A)  Final      Susceptibility   Escherichia coli - MIC*    AMPICILLIN >=32 RESISTANT Resistant     CEFAZOLIN >=64 RESISTANT Resistant     CEFTRIAXONE RESISTANT Resistant     CIPROFLOXACIN >=4 RESISTANT Resistant     GENTAMICIN <=1 SENSITIVE Sensitive     IMIPENEM 0.5 SENSITIVE Sensitive     NITROFURANTOIN <=16 SENSITIVE Sensitive     TRIMETH/SULFA <=20 SENSITIVE Sensitive     AMPICILLIN/SULBACTAM 8 SENSITIVE Sensitive     PIP/TAZO <=4 SENSITIVE Sensitive     * >=100,000 COLONIES/mL ESCHERICHIA COLI  Blood Culture (routine x 2)     Status: None (Preliminary result)   Collection Time: 07/31/19  6:54 PM   Specimen: BLOOD  Result Value Ref Range Status   Specimen Description BLOOD BLOOD RIGHT HAND  Final   Special Requests   Final    BOTTLES DRAWN AEROBIC AND ANAEROBIC Blood Culture results may not be optimal due to an inadequate volume of blood received in culture bottles   Culture  Final    NO GROWTH 2 DAYS Performed at [facility], [address] [place], [place] [address]    Report Status PENDING  Incomplete  Blood Culture (routine x 2)     Status: None (Preliminary result)   Collection Time: 07/31/19  6:54 PM   Specimen: BLOOD  Result Value Ref Range Status   Specimen Description BLOOD BLOOD RIGHT HAND  Final   Special Requests   Final    BOTTLES DRAWN AEROBIC AND ANAEROBIC Blood Culture results may not be  optimal due to an inadequate volume of blood received in culture bottles   Culture   Final    NO GROWTH 2 DAYS Performed at [facility], [address] [place], [place] [address]    Report Status PENDING  Incomplete  Respiratory Panel by RT PCR (Flu A&B, Covid) - Nasopharyngeal Swab     Status: None   Collection Time: 07/31/19  9:42 PM   Specimen: Nasopharyngeal Swab  Result Value Ref Range Status   SARS Coronavirus 2 by RT PCR NEGATIVE NEGATIVE Final    Comment: (NOTE) SARS-CoV-2 target nucleic acids are NOT DETECTED. The SARS-CoV-2 RNA is generally detectable in upper respiratoy specimens during the acute phase of infection. The lowest concentration of SARS-CoV-2 viral copies this assay can detect is 131 copies/mL. A negative result does not preclude SARS-Cov-2 infection and should not be used as the sole basis for treatment or other patient management decisions. A negative result may occur with  improper specimen collection/handling, submission of specimen other than nasopharyngeal swab, presence of viral mutation(s) within the areas targeted by this assay, and inadequate number of viral copies (<131 copies/mL). A negative result must be combined with clinical observations, patient history, and epidemiological information. The expected result is Negative. Fact Sheet for Patients:  PinkCheek.be Fact Sheet for Healthcare Providers:  GravelBags.it This test is not yet ap proved or cleared by the [place] FDA and  has been authorized for detection and/or diagnosis of SARS-CoV-2 by FDA under an Emergency Use Authorization (EUA). This EUA will remain  in effect (meaning this test can be used) for the duration of the COVID-19 declaration under Section 564(b)(1) of the Act, 21 U.S.C. section 360bbb-3(b)(1), unless the authorization is terminated or revoked sooner.    Influenza A by PCR NEGATIVE NEGATIVE Final    Influenza B by PCR NEGATIVE NEGATIVE Final    Comment: (NOTE) The Xpert Xpress SARS-CoV-2/FLU/RSV assay is intended as an aid in  the diagnosis of influenza from Nasopharyngeal swab specimens and  should not be used as a sole basis for treatment. Nasal washings and  aspirates are unacceptable for Xpert Xpress SARS-CoV-2/FLU/RSV  testing. Fact Sheet for Patients: PinkCheek.be Fact Sheet for Healthcare Providers: GravelBags.it This test is not yet approved or cleared by the [place] FDA and  has been authorized for detection and/or diagnosis of SARS-CoV-2 by  FDA under an Emergency Use Authorization (EUA). This EUA will remain  in effect (meaning this test can be used) for the duration of the  Covid-19 declaration under Section 564(b)(1) of the Act, 21  U.S.C. section 360bbb-3(b)(1), unless the authorization is  terminated or revoked. Performed at [facility], [address] [place], [place] [address]     Time coordinating discharge: Over 30 minutes  SIGNED:  Lorella Nimrod, MD  [facility] 08/02/2019, 10:18 AM  If 7PM-7AM, please contact night-coverage [email or web address]  This record has been created using Systems analyst. Errors have been sought  and corrected,but may not always be located. Such creation errors do not reflect on the standard of care.  

## 2019-08-05 LAB — CULTURE, BLOOD (ROUTINE X 2)
Culture: NO GROWTH
Culture: NO GROWTH

## 2019-09-25 ENCOUNTER — Emergency Department: Payer: Medicare Other

## 2019-09-25 ENCOUNTER — Inpatient Hospital Stay
Admission: EM | Admit: 2019-09-25 | Discharge: 2019-09-27 | DRG: 292 | Disposition: A | Discharge status: Home / Self Care | Payer: Medicare Other | Attending: Internal Medicine | Admitting: Internal Medicine

## 2019-09-25 ENCOUNTER — Other Ambulatory Visit: Payer: Self-pay

## 2019-09-25 DIAGNOSIS — Z8673 Personal history of transient ischemic attack (TIA), and cerebral infarction without residual deficits: Secondary | ICD-10-CM | POA: Diagnosis not present

## 2019-09-25 DIAGNOSIS — I5033 Acute on chronic diastolic (congestive) heart failure: Secondary | ICD-10-CM

## 2019-09-25 DIAGNOSIS — I480 Paroxysmal atrial fibrillation: Secondary | ICD-10-CM | POA: Diagnosis present

## 2019-09-25 DIAGNOSIS — E785 Hyperlipidemia, unspecified: Secondary | ICD-10-CM | POA: Diagnosis present

## 2019-09-25 DIAGNOSIS — E1142 Type 2 diabetes mellitus with diabetic polyneuropathy: Secondary | ICD-10-CM | POA: Diagnosis present

## 2019-09-25 DIAGNOSIS — Z9861 Coronary angioplasty status: Secondary | ICD-10-CM | POA: Diagnosis not present

## 2019-09-25 DIAGNOSIS — Z794 Long term (current) use of insulin: Secondary | ICD-10-CM

## 2019-09-25 DIAGNOSIS — I2510 Atherosclerotic heart disease of native coronary artery without angina pectoris: Secondary | ICD-10-CM | POA: Diagnosis present

## 2019-09-25 DIAGNOSIS — Z7983 Long term (current) use of bisphosphonates: Secondary | ICD-10-CM

## 2019-09-25 DIAGNOSIS — Z8249 Family history of ischemic heart disease and other diseases of the circulatory system: Secondary | ICD-10-CM | POA: Diagnosis not present

## 2019-09-25 DIAGNOSIS — E039 Hypothyroidism, unspecified: Secondary | ICD-10-CM | POA: Diagnosis present

## 2019-09-25 DIAGNOSIS — G4733 Obstructive sleep apnea (adult) (pediatric): Secondary | ICD-10-CM | POA: Diagnosis present

## 2019-09-25 DIAGNOSIS — I081 Rheumatic disorders of both mitral and tricuspid valves: Secondary | ICD-10-CM | POA: Diagnosis present

## 2019-09-25 DIAGNOSIS — I5031 Acute diastolic (congestive) heart failure: Secondary | ICD-10-CM | POA: Diagnosis not present

## 2019-09-25 DIAGNOSIS — Z20822 Contact with and (suspected) exposure to covid-19: Secondary | ICD-10-CM | POA: Diagnosis present

## 2019-09-25 DIAGNOSIS — E11649 Type 2 diabetes mellitus with hypoglycemia without coma: Secondary | ICD-10-CM | POA: Diagnosis not present

## 2019-09-25 DIAGNOSIS — I509 Heart failure, unspecified: Secondary | ICD-10-CM

## 2019-09-25 DIAGNOSIS — Z951 Presence of aortocoronary bypass graft: Secondary | ICD-10-CM

## 2019-09-25 DIAGNOSIS — I10 Essential (primary) hypertension: Secondary | ICD-10-CM

## 2019-09-25 DIAGNOSIS — Z823 Family history of stroke: Secondary | ICD-10-CM | POA: Diagnosis not present

## 2019-09-25 DIAGNOSIS — N179 Acute kidney failure, unspecified: Secondary | ICD-10-CM | POA: Diagnosis not present

## 2019-09-25 DIAGNOSIS — Z79890 Hormone replacement therapy (postmenopausal): Secondary | ICD-10-CM

## 2019-09-25 DIAGNOSIS — D696 Thrombocytopenia, unspecified: Secondary | ICD-10-CM | POA: Diagnosis present

## 2019-09-25 DIAGNOSIS — K219 Gastro-esophageal reflux disease without esophagitis: Secondary | ICD-10-CM | POA: Diagnosis present

## 2019-09-25 DIAGNOSIS — I5023 Acute on chronic systolic (congestive) heart failure: Secondary | ICD-10-CM

## 2019-09-25 DIAGNOSIS — Z96653 Presence of artificial knee joint, bilateral: Secondary | ICD-10-CM | POA: Diagnosis present

## 2019-09-25 DIAGNOSIS — Z7982 Long term (current) use of aspirin: Secondary | ICD-10-CM | POA: Diagnosis not present

## 2019-09-25 DIAGNOSIS — E876 Hypokalemia: Secondary | ICD-10-CM | POA: Diagnosis not present

## 2019-09-25 DIAGNOSIS — Z9119 Patient's noncompliance with other medical treatment and regimen: Secondary | ICD-10-CM

## 2019-09-25 DIAGNOSIS — I110 Hypertensive heart disease with heart failure: Secondary | ICD-10-CM | POA: Diagnosis not present

## 2019-09-25 DIAGNOSIS — E1151 Type 2 diabetes mellitus with diabetic peripheral angiopathy without gangrene: Secondary | ICD-10-CM | POA: Diagnosis present

## 2019-09-25 LAB — CBC
HCT: 30.8 % — ABNORMAL LOW (ref 36.0–46.0)
Hemoglobin: 10.8 g/dL — ABNORMAL LOW (ref 12.0–15.0)
MCH: 31.7 pg (ref 26.0–34.0)
MCHC: 35.1 g/dL (ref 30.0–36.0)
MCV: 90.3 fL (ref 80.0–100.0)
Platelets: 60 10*3/uL — ABNORMAL LOW (ref 150–400)
RBC: 3.41 MIL/uL — ABNORMAL LOW (ref 3.87–5.11)
RDW: 17.4 % — ABNORMAL HIGH (ref 11.5–15.5)
WBC: 4.1 10*3/uL (ref 4.0–10.5)
nRBC: 0 % (ref 0.0–0.2)

## 2019-09-25 LAB — BASIC METABOLIC PANEL
Anion gap: 10 (ref 5–15)
BUN: 19 mg/dL (ref 8–23)
CO2: 31 mmol/L (ref 22–32)
Calcium: 8.8 mg/dL — ABNORMAL LOW (ref 8.9–10.3)
Chloride: 96 mmol/L — ABNORMAL LOW (ref 98–111)
Creatinine, Ser: 1.19 mg/dL — ABNORMAL HIGH (ref 0.44–1.00)
GFR calc Af Amer: 50 mL/min — ABNORMAL LOW (ref >60)
GFR calc non Af Amer: 43 mL/min — ABNORMAL LOW (ref >60)
Glucose, Bld: 128 mg/dL — ABNORMAL HIGH (ref 70–99)
Potassium: 3.5 mmol/L (ref 3.5–5.1)
Sodium: 137 mmol/L (ref 135–145)

## 2019-09-25 LAB — BRAIN NATRIURETIC PEPTIDE: B Natriuretic Peptide: 221.8 pg/mL — ABNORMAL HIGH (ref 0.0–100.0)

## 2019-09-25 LAB — SARS CORONAVIRUS 2 BY RT PCR (HOSPITAL ORDER, PERFORMED IN ~~LOC~~ HOSPITAL LAB): SARS Coronavirus 2: NEGATIVE

## 2019-09-25 LAB — GLUCOSE, CAPILLARY: Glucose-Capillary: 153 mg/dL — ABNORMAL HIGH (ref 70–99)

## 2019-09-25 MED ORDER — SODIUM CHLORIDE 0.9% FLUSH: 3.0000 mL | INTRAVENOUS | Status: DC | PRN

## 2019-09-25 MED ORDER — FERROUS SULFATE 325 (65 FE) MG PO TABS
325.0000 mg | ORAL_TABLET | Freq: Every day | ORAL | Status: DC
  Administered 2019-09-26 – 2019-09-27 (×3): 325 mg via ORAL
  Filled 2019-09-25 (×5): qty 1

## 2019-09-25 MED ORDER — SODIUM CHLORIDE 0.9 % IV SOLN: 250.0000 mL | INTRAVENOUS | Status: DC | PRN

## 2019-09-25 MED ORDER — ZOLPIDEM TARTRATE 5 MG PO TABS: 5.0000 mg | ORAL_TABLET | Freq: Every evening | ORAL | Status: DC | PRN

## 2019-09-25 MED ORDER — BENZONATATE 100 MG PO CAPS
100.0000 mg | ORAL_CAPSULE | Freq: Three times a day (TID) | ORAL | Status: DC | PRN
  Administered 2019-09-25 – 2019-09-26 (×2): 100 mg via ORAL
  Filled 2019-09-25 (×2): qty 1

## 2019-09-25 MED ORDER — CALCIUM CARBONATE-VITAMIN D 500-200 MG-UNIT PO TABS
1.0000 | ORAL_TABLET | Freq: Two times a day (BID) | ORAL | Status: DC
  Administered 2019-09-25 – 2019-09-27 (×4): 1 via ORAL
  Filled 2019-09-25 (×6): qty 1

## 2019-09-25 MED ORDER — ASCORBIC ACID 500 MG PO TABS
500.0000 mg | ORAL_TABLET | Freq: Every day | ORAL | Status: DC
  Administered 2019-09-26 – 2019-09-27 (×2): 500 mg via ORAL
  Filled 2019-09-25 (×2): qty 1

## 2019-09-25 MED ORDER — FUROSEMIDE 10 MG/ML IJ SOLN
40.0000 mg | Freq: Once | INTRAMUSCULAR | Status: AC
  Administered 2019-09-25: 40 mg via INTRAVENOUS
  Filled 2019-09-25: qty 4

## 2019-09-25 MED ORDER — ASPIRIN EC 81 MG PO TBEC
81.0000 mg | DELAYED_RELEASE_TABLET | Freq: Every day | ORAL | Status: DC
  Administered 2019-09-26 – 2019-09-27 (×2): 81 mg via ORAL
  Filled 2019-09-25 (×2): qty 1

## 2019-09-25 MED ORDER — FOLIC ACID 1 MG PO TABS
500.0000 ug | ORAL_TABLET | Freq: Every day | ORAL | Status: DC
  Administered 2019-09-26 – 2019-09-27 (×3): 0.5 mg via ORAL
  Filled 2019-09-25 (×3): qty 1

## 2019-09-25 MED ORDER — SIMVASTATIN 20 MG PO TABS
20.0000 mg | ORAL_TABLET | Freq: Every day | ORAL | Status: DC
  Administered 2019-09-25 – 2019-09-26 (×2): 20 mg via ORAL
  Filled 2019-09-25 (×2): qty 1

## 2019-09-25 MED ORDER — SODIUM CHLORIDE 0.9% FLUSH: 3.0000 mL | Freq: Once | INTRAVENOUS | Status: DC

## 2019-09-25 MED ORDER — LEVOTHYROXINE SODIUM 50 MCG PO TABS
50.0000 ug | ORAL_TABLET | Freq: Every day | ORAL | Status: DC
  Administered 2019-09-26 – 2019-09-27 (×2): 50 ug via ORAL
  Filled 2019-09-25 (×2): qty 1

## 2019-09-25 MED ORDER — ENOXAPARIN SODIUM 40 MG/0.4ML ~~LOC~~ SOLN
40.0000 mg | SUBCUTANEOUS | Status: DC
  Administered 2019-09-26: 40 mg via SUBCUTANEOUS
  Filled 2019-09-25 (×2): qty 0.4

## 2019-09-25 MED ORDER — ALENDRONATE SODIUM 70 MG PO TABS: 70.0000 mg | ORAL_TABLET | ORAL | Status: DC

## 2019-09-25 MED ORDER — PANTOPRAZOLE SODIUM 40 MG PO TBEC
40.0000 mg | DELAYED_RELEASE_TABLET | Freq: Every day | ORAL | Status: DC
  Administered 2019-09-26 – 2019-09-27 (×2): 40 mg via ORAL
  Filled 2019-09-25 (×2): qty 1

## 2019-09-25 MED ORDER — INSULIN ASPART 100 UNIT/ML ~~LOC~~ SOLN
0.0000 [IU] | Freq: Three times a day (TID) | SUBCUTANEOUS | Status: DC
  Administered 2019-09-26 (×2): 2 [IU] via SUBCUTANEOUS
  Filled 2019-09-25 (×2): qty 1

## 2019-09-25 MED ORDER — ALPRAZOLAM 0.25 MG PO TABS: 0.2500 mg | ORAL_TABLET | Freq: Two times a day (BID) | ORAL | Status: DC | PRN

## 2019-09-25 MED ORDER — ZINC SULFATE 220 (50 ZN) MG PO CAPS
220.0000 mg | ORAL_CAPSULE | Freq: Every day | ORAL | Status: DC
  Administered 2019-09-26 – 2019-09-27 (×3): 220 mg via ORAL
  Filled 2019-09-25 (×3): qty 1

## 2019-09-25 MED ORDER — FUROSEMIDE 10 MG/ML IJ SOLN
40.0000 mg | Freq: Two times a day (BID) | INTRAMUSCULAR | Status: DC
  Administered 2019-09-26 (×2): 40 mg via INTRAVENOUS
  Filled 2019-09-25 (×2): qty 4

## 2019-09-25 MED ORDER — POTASSIUM CHLORIDE CRYS ER 20 MEQ PO TBCR
20.0000 meq | EXTENDED_RELEASE_TABLET | Freq: Two times a day (BID) | ORAL | Status: DC
  Administered 2019-09-25 – 2019-09-26 (×2): 20 meq via ORAL
  Filled 2019-09-25 (×2): qty 1

## 2019-09-25 MED ORDER — ADULT MULTIVITAMIN W/MINERALS CH
1.0000 | ORAL_TABLET | Freq: Every day | ORAL | Status: DC
  Administered 2019-09-26 – 2019-09-27 (×3): 1 via ORAL
  Filled 2019-09-25 (×3): qty 1

## 2019-09-25 MED ORDER — GABAPENTIN 100 MG PO CAPS
200.0000 mg | ORAL_CAPSULE | Freq: Every day | ORAL | Status: DC
  Administered 2019-09-25 – 2019-09-26 (×2): 200 mg via ORAL
  Filled 2019-09-25 (×3): qty 2

## 2019-09-25 MED ORDER — INSULIN DETEMIR 100 UNIT/ML ~~LOC~~ SOLN
20.0000 [IU] | Freq: Every day | SUBCUTANEOUS | Status: DC
  Administered 2019-09-25 – 2019-09-26 (×2): 20 [IU] via SUBCUTANEOUS
  Filled 2019-09-25 (×4): qty 0.2

## 2019-09-25 MED ORDER — POTASSIUM CHLORIDE 20 MEQ PO PACK: 40.0000 meq | PACK | Freq: Once | ORAL | Status: DC

## 2019-09-25 MED ORDER — ONDANSETRON HCL 4 MG/2ML IJ SOLN: 4.0000 mg | Freq: Four times a day (QID) | INTRAMUSCULAR | Status: DC | PRN

## 2019-09-25 MED ORDER — ACETAMINOPHEN 325 MG PO TABS: 650.0000 mg | ORAL_TABLET | ORAL | Status: DC | PRN

## 2019-09-25 MED ORDER — RIFAXIMIN 550 MG PO TABS
550.0000 mg | ORAL_TABLET | Freq: Two times a day (BID) | ORAL | Status: DC
  Administered 2019-09-26 – 2019-09-27 (×3): 550 mg via ORAL
  Filled 2019-09-25 (×5): qty 1

## 2019-09-25 MED ORDER — LISINOPRIL 10 MG PO TABS
10.0000 mg | ORAL_TABLET | Freq: Every day | ORAL | Status: DC
  Administered 2019-09-26: 10 mg via ORAL
  Filled 2019-09-25: qty 1

## 2019-09-25 MED ORDER — SODIUM CHLORIDE 0.9% FLUSH
3.0000 mL | Freq: Two times a day (BID) | INTRAVENOUS | Status: DC
  Administered 2019-09-26 – 2019-09-27 (×4): 3 mL via INTRAVENOUS

## 2019-09-25 MED ORDER — MELATONIN 5 MG PO TABS
10.0000 mg | ORAL_TABLET | Freq: Every day | ORAL | Status: DC
  Administered 2019-09-25 – 2019-09-26 (×2): 10 mg via ORAL
  Filled 2019-09-25 (×4): qty 2

## 2019-09-25 MED ORDER — MECLIZINE HCL 12.5 MG PO TABS
12.5000 mg | ORAL_TABLET | Freq: Three times a day (TID) | ORAL | Status: DC | PRN
  Filled 2019-09-25: qty 1

## 2019-09-25 NOTE — ED Notes (Signed)
Patient denies pain and is resting comfortably.  

## 2019-09-25 NOTE — H&P (Signed)
[facility] [place] NAME: Theresa [name]    MR#:  [id number]  DATE OF BIRTH:  [birth year]  DATE OF ADMISSION:  09/25/2019  PRIMARY CARE PHYSICIAN: Tracie Harrier, MD   REQUESTING/REFERRING PHYSICIAN: Duffy Bruce, MD CHIEF COMPLAINT:  Shortness of breath  HISTORY OF PRESENT ILLNESS:  Theresa [name]  is a 79 y.o. Caucasian female with a known history of coronary artery disease, asthma, hypertension, dyslipidemia, obstructive sleep apnea and peripheral vascular disease as well as CHF, who presented to the emergency room with acute onset of worsening dyspnea and orthopnea likely and weight gain of 5 pounds over the last week.  She has been having worsening  dyspnea on exertion, dry cough and occasional wheezing.  She has been having occasional chest tightness when she sleeps flat.  No fever or chills.  No nausea or vomiting or abdominal pain.  The patient has been taking extra doses of her home torsemide without significant improvement.  Today she was unable to walk across the room without dyspnea.  She has occasional dizziness when she gets up too fast or changes direction.  She denied any worsening lower extremity edema.  Upon presentation to the emergency room, vital signs were within normal.  Labs revealed borderline potassium at 3.5 and a creatinine of 1.19 compared to 1.02 on 08/02/2019.  BNP was 221.8.  Labs revealed anemia at baseline.  COVID-19 PCR is currently pending.  Two-view chest x-ray showed pulmonary vascular congestion with right basal atelectasis/infiltrate and small right pleural effusion.  The patient was given 40 mg of IV Lasix.  She will be admitted to a progressive unit bed for further evaluation and management.  PAST MEDICAL HISTORY:   Past Medical History:  Diagnosis Date  . Anemia   . Arthritis   . Asthma   . Bradycardia   . CAD (coronary artery disease)    BIL.CAROTID ARTERY STENOSIS  . CHF (congestive heart failure) ([place])   .  Chronic low back pain   . 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 Colon polyps   . Diabetes mellitus without complication ([place])   . Dyspnea   . Esophageal varices without bleeding ([place])   . GERD (gastroesophageal reflux disease)   . Hip pain   . Hyperlipidemia   . Hypertension   . IDA (iron deficiency anemia)   . 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 Sleep apnea   . SOB (shortness of breath)   . Stroke ([place])   . Temporal arteritis ([place])   . Thrombocytopenia ([place])   . Thrombocytopenia ([place])     PAST SURGICAL HISTORY:   Past Surgical History:  Procedure Laterality Date  . COLONOSCOPY  10/24/2013, 2002   hyperplastic polpys  . CORONARY ANGIOPLASTY    . CORONARY ARTERY BYPASS GRAFT  09/1998  . ESOPHAGOGASTRODUODENOSCOPY (EGD) WITH PROPOFOL N/A 08/26/2016   Procedure: ESOPHAGOGASTRODUODENOSCOPY (EGD) WITH PROPOFOL;  Surgeon: Manya Silvas, MD;  Location: [facility] ENDOSCOPY;  Service: Endoscopy;  Laterality: N/A;  . ESOPHAGOGASTRODUODENOSCOPY (EGD) WITH PROPOFOL N/A 02/05/2018   Procedure: ESOPHAGOGASTRODUODENOSCOPY (EGD) WITH PROPOFOL;  Surgeon: Manya Silvas, MD;  Location: [facility] ENDOSCOPY;  Service: Endoscopy;  Laterality: N/A;  . ESOPHAGOGASTRODUODENOSCOPY (EGD) WITH PROPOFOL N/A 07/22/2019   Procedure: ESOPHAGOGASTRODUODENOSCOPY (EGD) WITH PROPOFOL;  Surgeon: Toledo, Benay Pike, MD;  Location: [facility] ENDOSCOPY;  Service: Gastroenterology;  Laterality: N/A;  . ESOPHAGOGASTRODUODENOSCOPY ENDOSCOPY  05/24/2011  . hemorroid surgery    . JOINT REPLACEMENT  2003  2005   bil.knees    SOCIAL HISTORY:   Social History   Tobacco Use  . Smoking status: Never Smoker  . Smokeless tobacco: Never Used  Substance Use Topics  . Alcohol use: No    Alcohol/week: 0.0 standard drinks    FAMILY HISTORY:   Family History  Problem Relation Age of Onset  . Heart disease Other   . Hypertension Other   . Anemia Other   . Colon cancer Other   . CVA Mother    . Dementia Mother   . CAD Father   . Breast cancer Neg Hx     DRUG ALLERGIES:   Allergies  Allergen Reactions  . Morphine And Related Nausea And Vomiting    Other reaction(s): Nausea And Vomiting  . Adhesive [Tape] Rash  . Other Rash    NEOPRENE, TAPE, BAND-AID TOUGH STRIPS    REVIEW OF SYSTEMS:   ROS As per history of present illness. All pertinent systems were reviewed above. Constitutional,  HEENT, cardiovascular, respiratory, GI, GU, musculoskeletal, neuro, psychiatric, endocrine,  integumentary and hematologic systems were reviewed and are otherwise  negative/unremarkable except for positive findings mentioned above in the HPI.   MEDICATIONS AT HOME:   Prior to Admission medications   Medication Sig Start Date End Date Taking? Authorizing Provider  alendronate (FOSAMAX) 70 MG tablet Take 70 mg by mouth every Monday. Take with a full glass of water on an empty stomach.    [provider]  aspirin EC 81 MG tablet Take 81 mg by mouth daily.    [provider]  blood glucose meter kit and supplies KIT Dispense based on patient and insurance preference. Use up to four times daily as directed. (FOR ICD-9 250.00, 250.01). 04/24/16   Gladstone Lighter, MD  Calcium Carbonate-Vitamin D (CALCIUM-VITAMIN D) 500-200 MG-UNIT tablet Take 1 tablet by mouth 2 (two) times daily.     [provider]  Cranberry-Milk Thistle 250-75 MG CAPS Take 1 capsule by mouth daily.    [provider]  ferrous sulfate 325 (65 FE) MG tablet Take 325 mg by mouth daily.     [provider]  folic acid (FOLVITE) 831 MCG tablet Take 400 mcg by mouth daily.     [provider]  gabapentin (NEURONTIN) 100 MG capsule Take 200 mg by mouth at bedtime.  01/26/15   [provider]  insulin detemir (LEVEMIR) 100 unit/ml SOLN Inject 0.2 mLs (20 Units total) into the skin at bedtime. 08/02/19   Lorella Nimrod, MD  levothyroxine (SYNTHROID, LEVOTHROID) 50 MCG  tablet Take 50 mcg by mouth daily before breakfast.  03/31/15   [provider]  lisinopril (PRINIVIL,ZESTRIL) 10 MG tablet Take 10 mg by mouth daily.     [provider]  meclizine (ANTIVERT) 12.5 MG tablet Take 12.5 mg by mouth 3 (three) times daily as needed for dizziness.    [provider]  Melatonin 10 MG CAPS Take 10 mg by mouth at bedtime.     [provider]  metFORMIN (GLUCOPHAGE-XR) 500 MG 24 hr tablet Take 1,000 mg by mouth daily with supper.    [provider]  Multiple Vitamin (MULTI-VITAMINS) TABS Take 1 tablet by mouth daily.     [provider]  omeprazole (PRILOSEC) 20 MG capsule Take 20 mg by mouth daily.  03/24/15   [provider]  potassium chloride SA (K-DUR,KLOR-CON) 20 MEQ tablet Take 1 tablet (20 mEq total) by mouth 2 (two) times daily.  11/22/16   Cammie Sickle, MD  rifaximin (XIFAXAN) 550 MG TABS tablet Take 550 mg by mouth 2 (two) times daily.  01/20/15   [provider]  simvastatin (ZOCOR) 20 MG tablet Take 20 mg by mouth at bedtime.  03/24/15   [provider]  torsemide (DEMADEX) 20 MG tablet Take 20 mg by mouth 2 (two) times daily.    [provider]  vitamin C (ASCORBIC ACID) 500 MG tablet Take 500 mg by mouth daily.     [provider]  zinc gluconate 50 MG tablet Take 50 mg by mouth daily.    [provider]      VITAL SIGNS:  Blood pressure 110/76, pulse 68, temperature 98.4 F (36.9 C), resp. rate 18, height _0  (1.499 m), weight 72.1 kg, SpO2 95 %.  PHYSICAL EXAMINATION:  Physical Exam  GENERAL:  79 y.o.-year-old Caucasian female patient lying in the bed with no acute distress.  EYES: Pupils equal, round, reactive to light and accommodation. No scleral icterus. Extraocular muscles intact.  HEENT: Head atraumatic, normocephalic. Oropharynx and nasopharynx clear.  NECK:  Supple, no jugular venous distention. No thyroid enlargement, no  tenderness.  LUNGS: Diminished bibasal breath sounds with bibasal rales. CARDIOVASCULAR: Regular rate and rhythm, S1, S2 normal. No murmurs, rubs, or gallops.  ABDOMEN: Soft, nondistended, nontender. Bowel sounds present. No organomegaly or mass.  EXTREMITIES: 1+ bilateral lower extremity pitting edema with no cyanosis, or clubbing.  NEUROLOGIC: Cranial nerves II through XII are intact. Muscle strength 5/5 in all extremities. Sensation intact. Gait not checked.  PSYCHIATRIC: The patient is alert and oriented x 3.  Normal affect and good eye contact. SKIN: No obvious rash, lesion, or ulcer.   LABORATORY PANEL:   CBC Recent Labs  Lab 09/25/19 1608  WBC 4.1  HGB 10.8*  HCT 30.8*  PLT 60*   ------------------------------------------------------------------------------------------------------------------  Chemistries  Recent Labs  Lab 09/25/19 1608  NA 137  K 3.5  CL 96*  CO2 31  GLUCOSE 128*  BUN 19  CREATININE 1.19*  CALCIUM 8.8*   ------------------------------------------------------------------------------------------------------------------  Cardiac Enzymes No results for input(s): TROPONINI in the last 168 hours. ------------------------------------------------------------------------------------------------------------------  RADIOLOGY:  DG Chest 2 View  Result Date: 09/25/2019 CLINICAL DATA:  Shortness of breath. EXAM: CHEST - 2 VIEW COMPARISON:  07/31/2019 FINDINGS: Cardiopericardial silhouette is at upper limits of normal for size. There is pulmonary vascular congestion without overt pulmonary edema. Right base atelectasis/infiltrate noted with small right pleural effusion. The visualized bony structures of the thorax are intact. IMPRESSION: Pulmonary vascular congestion with right base atelectasis/infiltrate and small right pleural effusion, similar to prior. Electronically Signed   By: Misty Stanley M.D.   On: 09/25/2019 16:41      IMPRESSION AND PLAN:   1.   Acute on chronic diastolic CHF. -The patient's last 2D echo here revealed an EF of 60 to 65% in 2018 with mild mitral valve regurgitation and mild left atrial dilation. -The patient will be admitted to progressive care unit bed. -She will be diuresed with IV Lasix. -We will follow serial troponin I's. -We will monitor I's and O's and have daily weights. -Obtain a 2D echo. -We will obtain a cardiology consultation. -I notified Dr. Saralyn Pilar.  2.  Type 2 diabetes mellitus with peripheral neuropathy. -We will place her on supplemental coverage with NovoLog. -We will continue basal coverage. -Metformin will be held off. -We will continue Neurontin.  3.  Essential hypertension. -We will continue Lopressor and lisinopril.  4.  Hypothyroidism. -We will check TSH and continue Synthroid.  5.  DVT prophylaxis. -Subcutaneous Lovenox.   All the records are reviewed and case discussed with ED provider. The plan of care was discussed in details with the patient (and family). I answered all questions. The patient agreed to proceed with the above mentioned plan. Further management will depend upon hospital course.   CODE STATUS: Full code  Status is: Inpatient  Remains inpatient appropriate because:Ongoing diagnostic testing needed not appropriate for outpatient work up, Unsafe d/c plan, IV treatments appropriate due to intensity of illness or inability to take PO and Inpatient level of care appropriate due to severity of illness   Dispo: The patient is from: Home              Anticipated d/c is to: Home              Anticipated d/c date is: 2 days              Patient currently is not medically stable to d/c.   TOTAL TIME TAKING CARE OF THIS PATIENT: 50 minutes.    Christel Mormon M.D on 09/25/2019 at 8:26 PM  [facility]   From 7 PM-7 AM, contact night-coverage [email or web address]  CC: Primary care physician; Tracie Harrier, MD   Note: This dictation was prepared with Dragon  dictation along with smaller phrase technology. Any transcriptional typo errors that result from this process are unintentional.

## 2019-09-25 NOTE — Progress Notes (Signed)
Heart monitor in march. Afib history. Heart rate brady then tachy when wearing heart monitor. History of bleeding hemorrhoids and sx to fix this 3-4 years ago. MD recommends blood thinner to prevent stroke related to increased risk of stroke from Afib.

## 2019-09-25 NOTE — ED Provider Notes (Signed)
[facility] Emergency Department Provider Note  ____________________________________________   First MD Initiated Contact with Patient 09/25/19 1837     (approximate)  I have reviewed the triage vital signs and the nursing notes.   HISTORY  Chief Complaint No chief complaint on file.    HPI Theresa [name] is a 79 y.o. female  Here with SOB, leg swelling. Pt reports approx 1 week of progressively worsening SOB, leg swelling, and orthopnea. She has gained approx 4-5 lb despite increasing her torsemide (took 4x her usual dose yesterday). Reports she has had worsening leg edema and orthopnea, and is now sleeping essentially upright. She has felt SOB with even minimal exertion. She denies any CP, though she did have some mild R sided chest pressure lying flat. No fever, chills. No significant recent med changes or dietary changes. She tries to eat a low-sodium diet.         Past Medical History:  Diagnosis Date  . Anemia   . Arthritis   . Asthma   . Bradycardia   . CAD (coronary artery disease)    BIL.CAROTID ARTERY STENOSIS  . CHF (congestive heart failure) ([place])   . Chronic low back pain   . 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 Colon polyps   . Diabetes mellitus without complication ([place])   . Dyspnea   . Esophageal varices without bleeding ([place])   . GERD (gastroesophageal reflux disease)   . Hip pain   . Hyperlipidemia   . Hypertension   . IDA (iron deficiency anemia)   . 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 Sleep apnea   . SOB (shortness of breath)   . Stroke ([place])   . Temporal arteritis ([place])   . Thrombocytopenia ([place])   . Thrombocytopenia [facility])     Patient Active Problem List   Diagnosis Date Noted  . Acute CHF ([place]) 09/25/2019  . Acute lower UTI   . Syncope and collapse   . GI bleeding 07/31/2019  . Orthopnea 11/21/2016  . CVA (cerebral vascular accident) ([place]) 04/22/2016  . MGUS (monoclonal  gammopathy of unknown significance) 10/14/2015  . Cirrhosis ([place]) 10/14/2015  . 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 Thrombocytopenia ([place]) 10/03/2014  . Iron deficiency anemia due to chronic blood loss 10/03/2014    Past Surgical History:  Procedure Laterality Date  . COLONOSCOPY  10/24/2013, 2002   hyperplastic polpys  . CORONARY ANGIOPLASTY    . CORONARY ARTERY BYPASS GRAFT  09/1998  . ESOPHAGOGASTRODUODENOSCOPY (EGD) WITH PROPOFOL N/A 08/26/2016   Procedure: ESOPHAGOGASTRODUODENOSCOPY (EGD) WITH PROPOFOL;  Surgeon: Manya Silvas, MD;  Location: [facility] ENDOSCOPY;  Service: Endoscopy;  Laterality: N/A;  . ESOPHAGOGASTRODUODENOSCOPY (EGD) WITH PROPOFOL N/A 02/05/2018   Procedure: ESOPHAGOGASTRODUODENOSCOPY (EGD) WITH PROPOFOL;  Surgeon: Manya Silvas, MD;  Location: [facility] ENDOSCOPY;  Service: Endoscopy;  Laterality: N/A;  . ESOPHAGOGASTRODUODENOSCOPY (EGD) WITH PROPOFOL N/A 07/22/2019   Procedure: ESOPHAGOGASTRODUODENOSCOPY (EGD) WITH PROPOFOL;  Surgeon: Tol[place], Benay Pike, MD;  Location: [facility] ENDOSCOPY;  Service: Gastroenterology;  Laterality: N/A;  . ESOPHAGOGASTRODUODENOSCOPY ENDOSCOPY  05/24/2011  . hemorroid surgery    . JOINT REPLACEMENT  2003  2005   [email or web address]    Prior to Admission medications   Medication Sig Start Date End Date Taking? Authorizing Provider  alendronate (FOSAMAX) 70 MG tablet Take 70 mg by mouth every Monday. Take with a full glass of water on an empty stomach.   Yes [provider]  aspirin EC  81 MG tablet Take 81 mg by mouth daily.   Yes [provider]  blood glucose meter kit and supplies KIT Dispense based on patient and insurance preference. Use up to four times daily as directed. (FOR ICD-9 250.00, 250.01). 04/24/16  Yes Gladstone Lighter, MD  Calcium Carbonate-Vitamin D (CALCIUM-VITAMIN D) 500-200 MG-UNIT tablet Take 1 tablet by mouth 2 (two) times daily.    Yes [provider]  Cranberry-Milk Thistle 250-75 MG CAPS  Take 1 capsule by mouth 3 (three) times daily.    Yes [provider]  ferrous sulfate 325 (65 FE) MG tablet Take 325 mg by mouth daily.    Yes [provider]  folic acid (FOLVITE) 865 MCG tablet Take 400 mcg by mouth daily.    Yes [provider]  gabapentin (NEURONTIN) 100 MG capsule Take 200 mg by mouth at bedtime.  01/26/15  Yes [provider]  levothyroxine (SYNTHROID, LEVOTHROID) 50 MCG tablet Take 50 mcg by mouth daily before breakfast.  03/31/15  Yes [provider]  meclizine (ANTIVERT) 12.5 MG tablet Take 12.5 mg by mouth 3 (three) times daily as needed for dizziness.   Yes [provider]  Melatonin 10 MG CAPS Take 10 mg by mouth at bedtime.    Yes [provider]  metFORMIN (GLUCOPHAGE-XR) 500 MG 24 hr tablet Take 1,000 mg by mouth daily with supper.   Yes [provider]  metoprolol tartrate (LOPRESSOR) 25 MG tablet Take 25 mg by mouth in the morning and at bedtime. 07/22/19  Yes [provider]  Multiple Vitamin (MULTI-VITAMINS) TABS Take 1 tablet by mouth daily.    Yes [provider]  omeprazole (PRILOSEC) 20 MG capsule Take 20 mg by mouth daily.  03/24/15  Yes [provider]  potassium chloride SA (K-DUR,KLOR-CON) 20 MEQ tablet Take 1 tablet (20 mEq total) by mouth 2 (two) times daily. 11/22/16  Yes Cammie Sickle, MD  rifaximin (XIFAXAN) 550 MG TABS tablet Take 550 mg by mouth 2 (two) times daily.  01/20/15  Yes [provider]  simvastatin (ZOCOR) 20 MG tablet Take 20 mg by mouth at bedtime.  03/24/15  Yes [provider]  torsemide (DEMADEX) 20 MG tablet Take 20 mg by mouth 2 (two) times daily.   Yes [provider]  vitamin C (ASCORBIC ACID) 500 MG tablet Take 500 mg by mouth daily.    Yes [provider]  zinc gluconate 50 MG tablet Take 50 mg by mouth daily.   Yes [provider]    Allergies Morphine and related, Adhesive  [tape], and Other  Family History  Problem Relation Age of Onset  . Heart disease Other   . Hypertension Other   . Anemia Other   . Colon cancer Other   . CVA Mother   . Dementia Mother   . CAD Father   . Breast cancer Neg Hx     Social History Social History   Tobacco Use  . Smoking status: Never Smoker  . Smokeless tobacco: Never Used  Vaping Use  . Vaping Use: Never used  Substance Use Topics  . Alcohol use: No    Alcohol/week: 0.0 standard drinks  . Drug use: No    Review of Systems  Review of Systems  Constitutional: Positive for fatigue. Negative for fever.  HENT: Negative for congestion and sore throat.   Eyes: Negative for visual disturbance.  Respiratory: Positive for cough, chest tightness and shortness of breath.  Cardiovascular: Positive for leg swelling. Negative for chest pain.  Gastrointestinal: Negative for abdominal pain, diarrhea, nausea and vomiting.  Genitourinary: Negative for flank pain.  Musculoskeletal: Negative for back pain and neck pain.  Skin: Negative for rash and wound.  Neurological: Negative for weakness.  All other systems reviewed and are negative.    ____________________________________________  PHYSICAL EXAM:      VITAL SIGNS: ED Triage Vitals  Enc Vitals Group     BP 09/25/19 1604 123/71     Pulse Rate 09/25/19 1604 61     Resp 09/25/19 1604 20     Temp 09/25/19 1604 98.4 F (36.9 C)     Temp src --      SpO2 09/25/19 1604 96 %     Weight 09/25/19 1605 159 lb (72.1 kg)     Height 09/25/19 1605 4' 11"  (1.499 m)     Head Circumference --      Peak Flow --      Pain Score 09/25/19 1605 0     Pain Loc --      Pain Edu? --      Excl. in [place]? --      Physical Exam Vitals and nursing note reviewed.  Constitutional:      General: She is not in acute distress.    Appearance: She is well-developed.  HENT:     Head: Normocephalic and atraumatic.  Eyes:     Conjunctiva/sclera: Conjunctivae normal.  Cardiovascular:      Rate and Rhythm: Regular rhythm. Bradycardia present.     Heart sounds: Normal heart sounds. No murmur heard.  No friction rub.  Pulmonary:     Effort: Pulmonary effort is normal. No respiratory distress.     Breath sounds: Rales (bibasilar) present. No wheezing.  Abdominal:     General: There is no distension.     Palpations: Abdomen is soft.     Tenderness: There is no abdominal tenderness.  Musculoskeletal:     Cervical back: Neck supple.     Right lower leg: Edema present.     Left lower leg: Edema present.  Skin:    General: Skin is warm.     Capillary Refill: Capillary refill takes less than 2 seconds.  Neurological:     Mental Status: She is alert and oriented to person, place, and time.     Motor: No abnormal muscle tone.       ____________________________________________   LABS (all labs ordered are listed, but only abnormal results are displayed)  Labs Reviewed  BASIC METABOLIC PANEL - Abnormal; Notable for the following components:      Result Value   Chloride 96 (*)    Glucose, Bld 128 (*)    Creatinine, Ser 1.19 (*)    Calcium 8.8 (*)    GFR calc non Af Amer 43 (*)    GFR calc Af Amer 50 (*)    All other components within normal limits  CBC - Abnormal; Notable for the following components:   RBC 3.41 (*)    Hemoglobin 10.8 (*)    HCT 30.8 (*)    RDW 17.4 (*)    Platelets 60 (*)    All other components within normal limits  BRAIN NATRIURETIC PEPTIDE - Abnormal; Notable for the following components:   B Natriuretic Peptide 221.8 (*)    All other components within normal limits  SARS CORONAVIRUS 2 BY RT PCR (HOSPITAL ORDER, [place] [facility])  BASIC METABOLIC PANEL  ____________________________________________  EKG: Sinus bradycardia with sinus arrhythmia, VR 58. PR 192, QRS 90, QTc 459. No acute ST elevations or depressions. No ischemia. ________________________________________  RADIOLOGY All imaging, including plain  films, CT scans, and ultrasounds, independently reviewed by me, and interpretations confirmed via formal radiology reads.  ED MD interpretation:   CXR: Pulm vascular congestion, small R pleural effusion with infiltrate vs atelectasis  Official radiology report(s): DG Chest 2 View  Result Date: 09/25/2019 CLINICAL DATA:  Shortness of breath. EXAM: CHEST - 2 VIEW COMPARISON:  07/31/2019 FINDINGS: Cardiopericardial silhouette is at upper limits of normal for size. There is pulmonary vascular congestion without overt pulmonary edema. Right base atelectasis/infiltrate noted with small right pleural effusion. The visualized bony structures of the thorax are intact. IMPRESSION: Pulmonary vascular congestion with right base atelectasis/infiltrate and small right pleural effusion, similar to prior. Electronically Signed   By: Misty Stanley M.D.   On: 09/25/2019 16:41    ____________________________________________  PROCEDURES   Procedure(s) performed (including Critical Care):  .1-3 Lead EKG Interpretation Performed by: Duffy Bruce, MD Authorized by: Duffy Bruce, MD     Interpretation: non-specific     ECG rate:  45-65   ECG rate assessment: bradycardic     Rhythm: sinus bradycardia     Ectopy: PVCs     Conduction: normal   Comments:     Indication: CHF    ____________________________________________  INITIAL IMPRESSION / MDM / ASSESSMENT AND PLAN / ED COURSE  As part of my medical decision making, I reviewed the following data within the [facility] notes reviewed and incorporated, Old chart reviewed, Notes from prior ED visits, and  Controlled Substance Database       *Theresa [name] was evaluated in Emergency Department on 09/25/2019 for the symptoms described in the history of present illness. She was evaluated in the context of the global COVID-19 pandemic, which necessitated consideration that the patient might be at risk for infection with the  SARS-CoV-2 virus that causes COVID-19. Institutional protocols and algorithms that pertain to the evaluation of patients at risk for COVID-19 are in a state of rapid change based on information released by regulatory bodies including the CDC and federal and state organizations. These policies and algorithms were followed during the patient's care in the ED.  Some ED evaluations and interventions may be delayed as a result of limited staffing during the pandemic.*     Medical Decision Making:  79 yo F here with acute on chronic CHF. She has pitting edema on exam, rales, and tachypnea with orthopnea. Has failed increasing her torsemide as outpt. EKG nonischemic, no signs of ACS or ischemia. BNP elevated. Cr near baseline. Will admit for IV diuresis.  ____________________________________________  FINAL CLINICAL IMPRESSION(S) / ED DIAGNOSES  Final diagnoses:  Acute on chronic systolic congestive heart failure (HCC)     MEDICATIONS GIVEN DURING THIS VISIT:  Medications  sodium chloride flush (NS) 0.9 % injection 3 mL (3 mLs Intravenous Not Given 09/25/19 1620)  aspirin EC tablet 81 mg (has no administration in time range)  rifaximin (XIFAXAN) tablet 550 mg (has no administration in time range)  lisinopril (ZESTRIL) tablet 10 mg (has no administration in time range)  simvastatin (ZOCOR) tablet 20 mg (has no administration in time range)  insulin detemir (LEVEMIR) injection 20 Units (has no administration in time range)  levothyroxine (SYNTHROID) tablet 50 mcg (has no administration in time range)  meclizine (ANTIVERT) tablet 12.5 mg (has no administration in time  range)  pantoprazole (PROTONIX) EC tablet 40 mg (has no administration in time range)  ferrous sulfate tablet 325 mg (has no administration in time range)  folic acid (FOLVITE) tablet 0.5 mg (has no administration in time range)  melatonin tablet 10 mg (has no administration in time range)  gabapentin (NEURONTIN) capsule 200 mg (has  no administration in time range)  calcium-vitamin D (OSCAL WITH D) 500-200 MG-UNIT per tablet 1 tablet (has no administration in time range)  multivitamin with minerals tablet 1 tablet (has no administration in time range)  potassium chloride SA (KLOR-CON) CR tablet 20 mEq (has no administration in time range)  ascorbic acid (VITAMIN C) tablet 500 mg (has no administration in time range)  zinc sulfate capsule 220 mg (has no administration in time range)  sodium chloride flush (NS) 0.9 % injection 3 mL (has no administration in time range)  sodium chloride flush (NS) 0.9 % injection 3 mL (has no administration in time range)  0.9 %  sodium chloride infusion (has no administration in time range)  acetaminophen (TYLENOL) tablet 650 mg (has no administration in time range)  ondansetron (ZOFRAN) injection 4 mg (has no administration in time range)  enoxaparin (LOVENOX) injection 40 mg (has no administration in time range)  furosemide (LASIX) injection 40 mg (has no administration in time range)  potassium chloride (KLOR-CON) packet 40 mEq (40 mEq Oral Not Given 09/25/19 2217)  zolpidem (AMBIEN) tablet 5 mg (has no administration in time range)  ALPRAZolam (XANAX) tablet 0.25 mg (has no administration in time range)  insulin aspart (novoLOG) injection 0-9 Units (has no administration in time range)  furosemide (LASIX) injection 40 mg (40 mg Intravenous Given 09/25/19 1936)     ED Discharge Orders    None       Note:  This document was prepared using Dragon voice recognition software and may include unintentional dictation errors.   Duffy Bruce, MD 09/25/19 2232

## 2019-09-25 NOTE — ED Notes (Signed)
Pt ambulated to bathroom indecently. NAd noted.

## 2019-09-25 NOTE — ED Triage Notes (Signed)
PT to ED c/o bilateral edema, SOB, and orthopnea x1 week. PT has CHF and takes torsemide. PT took extra yesterday with no relief.

## 2019-09-26 ENCOUNTER — Inpatient Hospital Stay
Admit: 2019-09-26 | Discharge: 2019-09-26 | Disposition: A | Discharge status: Home / Self Care | Payer: Medicare Other | Attending: Family Medicine | Admitting: Family Medicine

## 2019-09-26 ENCOUNTER — Inpatient Hospital Stay: Admit: 2019-09-26 | Payer: Medicare Other

## 2019-09-26 DIAGNOSIS — N179 Acute kidney failure, unspecified: Secondary | ICD-10-CM

## 2019-09-26 DIAGNOSIS — E1142 Type 2 diabetes mellitus with diabetic polyneuropathy: Secondary | ICD-10-CM | POA: Diagnosis present

## 2019-09-26 DIAGNOSIS — I5031 Acute diastolic (congestive) heart failure: Secondary | ICD-10-CM

## 2019-09-26 LAB — ECHOCARDIOGRAM COMPLETE
Height: 59 in
Weight: 2606.72 oz

## 2019-09-26 LAB — BASIC METABOLIC PANEL
Anion gap: 11 (ref 5–15)
BUN: 21 mg/dL (ref 8–23)
CO2: 31 mmol/L (ref 22–32)
Calcium: 8.5 mg/dL — ABNORMAL LOW (ref 8.9–10.3)
Chloride: 99 mmol/L (ref 98–111)
Creatinine, Ser: 1.02 mg/dL — ABNORMAL HIGH (ref 0.44–1.00)
GFR calc Af Amer: 60 mL/min — ABNORMAL LOW (ref >60)
GFR calc non Af Amer: 52 mL/min — ABNORMAL LOW (ref >60)
Glucose, Bld: 73 mg/dL (ref 70–99)
Potassium: 3.3 mmol/L — ABNORMAL LOW (ref 3.5–5.1)
Sodium: 141 mmol/L (ref 135–145)

## 2019-09-26 LAB — GLUCOSE, CAPILLARY
Glucose-Capillary: 85 mg/dL (ref 70–99)
Glucose-Capillary: 183 mg/dL — ABNORMAL HIGH (ref 70–99)
Glucose-Capillary: 117 mg/dL — ABNORMAL HIGH (ref 70–99)
Glucose-Capillary: 189 mg/dL — ABNORMAL HIGH (ref 70–99)

## 2019-09-26 MED ORDER — SACUBITRIL-VALSARTAN 24-26 MG PO TABS
0.5000 | ORAL_TABLET | Freq: Two times a day (BID) | ORAL | Status: DC
  Administered 2019-09-26 – 2019-09-27 (×3): 0.5 via ORAL
  Filled 2019-09-26 (×3): qty 1

## 2019-09-26 MED ORDER — METOPROLOL TARTRATE 25 MG PO TABS
25.0000 mg | ORAL_TABLET | Freq: Two times a day (BID) | ORAL | Status: DC
  Administered 2019-09-26 – 2019-09-27 (×2): 25 mg via ORAL
  Filled 2019-09-26 (×2): qty 1

## 2019-09-26 MED ORDER — FUROSEMIDE 10 MG/ML IJ SOLN
40.0000 mg | Freq: Two times a day (BID) | INTRAMUSCULAR | Status: DC
  Administered 2019-09-26: 40 mg via INTRAVENOUS
  Filled 2019-09-26 (×2): qty 4

## 2019-09-26 MED ORDER — FUROSEMIDE 40 MG PO TABS: 40.0000 mg | ORAL_TABLET | Freq: Two times a day (BID) | ORAL | Status: DC

## 2019-09-26 MED ORDER — METOPROLOL TARTRATE 25 MG PO TABS: 12.5000 mg | ORAL_TABLET | Freq: Two times a day (BID) | ORAL | Status: DC

## 2019-09-26 MED ORDER — POTASSIUM CHLORIDE 20 MEQ PO PACK
40.0000 meq | PACK | Freq: Two times a day (BID) | ORAL | Status: AC
  Administered 2019-09-26 (×2): 40 meq via ORAL
  Filled 2019-09-26 (×2): qty 2

## 2019-09-26 NOTE — Progress Notes (Signed)
PROGRESS NOTE    Theresa [name]  JSE:[id number] DOB: 28-Aug-1940 DOA: 09/25/2019 PCP: Tracie Harrier, MD   Chief complaint.  Shortness of breath.   Brief Narrative:  Theresa [name]  is a 79 y.o. Caucasian female with a known history of coronary artery disease, asthma, hypertension, dyslipidemia, obstructive sleep apnea and peripheral vascular disease as well as CHF, who presented to the emergency room with acute onset of worsening dyspnea and orthopnea likely and weight gain of 5 pounds over the last week. Mild elevation in BNP, chest x-ray showed pulmonary vascular congestion.  She was giving IV Lasix.  Echocardiogram is ordered,    Assessment & Plan:   Active Problems:   Acute CHF ([place])  #1.  Acute on chronic diastolic congestive heart failure. Repeat echocardiogram was performed, pending results. He received IV Lasix.  Volume status much better today.  Will change Lasix to oral.  2.  Hypokalemia. Supplement orally.  3.  Essential hypertension. Continue home medicines.  4.  Type 2 diabetes with peripheral neuropathy. Glucose stable.  5.  Acute kidney injury. Renal function improved after IV Lasix.  Recheck a BMP tomorrow.     DVT prophylaxis: Lovenox Code Status: Full Family Communication: None in room Disposition Plan:  . Patient came from: Home            . Anticipated d/c place: Home . Barriers to d/c OR conditions which need to be met to effect a safe d/c: We will discharge home tomorrow.  Consultants:   None  Procedures: Echo Antimicrobials: None  Subjective: Patient feels much better today.  Slept well last night, short of breath much improved.  Dry cough, nonproductive. No nausea vomiting abdominal pain. No fever chills.  Objective: Vitals:   09/25/19 2224 09/26/19 0500 09/26/19 0553 09/26/19 0839  BP:   (!) 101/40 (!) 119/58  Pulse:   66 (!) 57  Resp:    16  Temp:   97.7 F (36.5 C) 97.7 F (36.5 C)  TempSrc:   Oral Oral  SpO2:   95% 97%   Weight: 73.9 kg 73.9 kg    Height: _0  (1.499 m)      No intake or output data in the 24 hours ending 09/26/19 1039 Filed Weights   09/25/19 1605 09/25/19 2224 09/26/19 0500  Weight: 72.1 kg 73.9 kg 73.9 kg    Examination:  General exam: Appears calm and comfortable  Respiratory system: A few crackles to auscultation. Respiratory effort normal. Cardiovascular system: S1 & S2 heard, RRR. No JVD, murmurs, rubs, gallops or clicks. 1+ pedal edema. Gastrointestinal system: Abdomen is nondistended, soft and nontender. No organomegaly or masses felt. Normal bowel sounds heard. Central nervous system: Alert and oriented. No focal neurological deficits. Extremities: Symmetric  Skin: No rashes, lesions or ulcers Psychiatry: Judgement and insight appear normal. Mood & affect appropriate.     Data Reviewed: I have personally reviewed following labs and imaging studies  CBC: Recent Labs  Lab 09/25/19 1608  WBC 4.1  HGB 10.8*  HCT 30.8*  MCV 90.3  PLT 60*   Basic Metabolic Panel: Recent Labs  Lab 09/25/19 1608 09/26/19 0559  NA 137 141  K 3.5 3.3*  CL 96* 99  CO2 31 31  GLUCOSE 128* 73  BUN 19 21  CREATININE 1.19* 1.02*  CALCIUM 8.8* 8.5*   GFR: Estimated Creatinine Clearance: 37.9 mL/min (A) (by C-G formula based on SCr of 1.02 mg/dL (H)). Liver Function Tests: No results for input(s): AST, ALT, ALKPHOS,  BILITOT, PROT, ALBUMIN in the last 168 hours. No results for input(s): LIPASE, AMYLASE in the last 168 hours. No results for input(s): AMMONIA in the last 168 hours. Coagulation Profile: No results for input(s): INR, PROTIME in the last 168 hours. Cardiac Enzymes: No results for input(s): CKTOTAL, CKMB, CKMBINDEX, TROPONINI in the last 168 hours. BNP (last 3 results) No results for input(s): PROBNP in the last 8760 hours. HbA1C: No results for input(s): HGBA1C in the last 72 hours. CBG: Recent Labs  Lab 09/25/19 2235 09/26/19 0840  GLUCAP 153* 85   Lipid  Profile: No results for input(s): CHOL, HDL, LDLCALC, TRIG, CHOLHDL, LDLDIRECT in the last 72 hours. Thyroid Function Tests: No results for input(s): TSH, T4TOTAL, FREET4, T3FREE, THYROIDAB in the last 72 hours. Anemia Panel: No results for input(s): VITAMINB12, FOLATE, FERRITIN, TIBC, IRON, RETICCTPCT in the last 72 hours. Sepsis Labs: No results for input(s): PROCALCITON, LATICACIDVEN in the last 168 hours.  Recent Results (from the past 240 hour(s))  SARS Coronavirus 2 by RT PCR (hospital order, performed in [facility] hospital lab) Nasopharyngeal Nasopharyngeal Swab     Status: None   Collection Time: 09/25/19  7:38 PM   Specimen: Nasopharyngeal Swab  Result Value Ref Range Status   SARS Coronavirus 2 NEGATIVE NEGATIVE Final    Comment: (NOTE) SARS-CoV-2 target nucleic acids are NOT DETECTED.  The SARS-CoV-2 RNA is generally detectable in upper and lower respiratory specimens during the acute phase of infection. The lowest concentration of SARS-CoV-2 viral copies this assay can detect is 250 copies / mL. A negative result does not preclude SARS-CoV-2 infection and should not be used as the sole basis for treatment or other patient management decisions.  A negative result may occur with improper specimen collection / handling, submission of specimen other than nasopharyngeal swab, presence of viral mutation(s) within the areas targeted by this assay, and inadequate number of viral copies (<250 copies / mL). A negative result must be combined with clinical observations, patient history, and epidemiological information.  Fact Sheet for Patients:   StrictlyIdeas.no  Fact Sheet for Healthcare Providers: BankingDealers.co.za  This test is not yet approved or  cleared by the [place] FDA and has been authorized for detection and/or diagnosis of SARS-CoV-2 by FDA under an Emergency Use Authorization (EUA).  This EUA will remain in  effect (meaning this test can be used) for the duration of the COVID-19 declaration under Section 564(b)(1) of the Act, 21 U.S.C. section 360bbb-3(b)(1), unless the authorization is terminated or revoked sooner.  Performed at [facility], [address], [place], [place] [address]          Radiology Studies: DG Chest 2 View  Result Date: 09/25/2019 CLINICAL DATA:  Shortness of breath. EXAM: CHEST - 2 VIEW COMPARISON:  07/31/2019 FINDINGS: Cardiopericardial silhouette is at upper limits of normal for size. There is pulmonary vascular congestion without overt pulmonary edema. Right base atelectasis/infiltrate noted with small right pleural effusion. The visualized bony structures of the thorax are intact. IMPRESSION: Pulmonary vascular congestion with right base atelectasis/infiltrate and small right pleural effusion, similar to prior. Electronically Signed   By: Misty Stanley M.D.   On: 09/25/2019 16:41        Scheduled Meds: . vitamin C  500 mg Oral Daily  . aspirin EC  81 mg Oral Daily  . calcium-vitamin D  1 tablet Oral BID  . enoxaparin (LOVENOX) injection  40 mg Subcutaneous Q24H  . ferrous sulfate  325 mg Oral  Daily  . folic acid  619 mcg Oral Daily  . furosemide  40 mg Oral BID  . gabapentin  200 mg Oral QHS  . insulin aspart  0-9 Units Subcutaneous TID PC & HS  . insulin detemir  20 Units Subcutaneous QHS  . levothyroxine  50 mcg Oral QAC breakfast  . lisinopril  10 mg Oral Daily  . melatonin  10 mg Oral QHS  . multivitamin with minerals  1 tablet Oral Daily  . pantoprazole  40 mg Oral Daily  . potassium chloride  40 mEq Oral BID  . rifaximin  550 mg Oral BID  . simvastatin  20 mg Oral QHS  . sodium chloride flush  3 mL Intravenous Once  . sodium chloride flush  3 mL Intravenous Q12H  . zinc sulfate  220 mg Oral Daily   Continuous Infusions: . sodium chloride       LOS: 1 day    Time spent: 28 minutes    Sharen Hones, MD [facility]   To contact the attending provider between 7A-7P or the covering provider during after hours 7P-7A, please log into the web site [email or web address] and access using universal [place] password for that web site. If you do not have the password, please call the hospital operator.  09/26/2019, 10:39 AM

## 2019-09-26 NOTE — Consult Note (Addendum)
CARDIOLOGY CONSULT NOTE               Patient ID: Theresa [name] MRN: [id number] DOB/AGE: [birth year] 79 y.o.  Admit date: 09/25/2019 Referring Physician Georgana Curio hospitalist Primary Physician Dr Patsy Lager primary Primary Cardiologist Dr Nehemiah Massed Reason for Consultation shortness of breath congestive heart failure  HPI: Patient is a 79 year old female history of diastolic congestive heart failure multivessel coronary disease including coronary bypass surgery asthma hypertension dyslipidemia obstructive sleep apnea peripheral vascular disease.  Patient had worsening dyspnea orthopnea with a 5 pound weight gain patient been having dry cough wheezing with worsening symptoms patient been taking extra dose of torsemide with no significant improvement with any ambulation she has had significant dyspnea even on short distances as well as worsening edema she has had no discrete anginal symptoms on exertion but she is had some chest pain at rest when she lays down she denies any blackout spells or syncope she has a known history of peripheral vascular disease as well as Theresa [name] diabetes appears to be reasonably well controlled now presents with worsening dyspnea and shortness of breath and evidence of diastolic heart failure  Review of systems complete and found to be negative unless listed above     Past Medical History:  Diagnosis Date  . Anemia   . Arthritis   . Asthma   . Bradycardia   . CAD (coronary artery disease)    BIL.CAROTID ARTERY STENOSIS  . CHF (congestive heart failure) ([place])   . Chronic low back pain   . 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 Colon polyps   . Diabetes mellitus without complication ([place])   . Dyspnea   . Esophageal varices without bleeding ([place])   . GERD (gastroesophageal reflux disease)   . Hip pain   . Hyperlipidemia   . Hypertension   . IDA (iron deficiency anemia)   . 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 Sleep apnea    . SOB (shortness of breath)   . Stroke ([place])   . Temporal arteritis ([place])   . Thrombocytopenia ([place])   . Thrombocytopenia ([place])     Past Surgical History:  Procedure Laterality Date  . COLONOSCOPY  10/24/2013, 2002   hyperplastic polpys  . CORONARY ANGIOPLASTY    . CORONARY ARTERY BYPASS GRAFT  09/1998  . ESOPHAGOGASTRODUODENOSCOPY (EGD) WITH PROPOFOL N/A 08/26/2016   Procedure: ESOPHAGOGASTRODUODENOSCOPY (EGD) WITH PROPOFOL;  Surgeon: Manya Silvas, MD;  Location: [facility] ENDOSCOPY;  Service: Endoscopy;  Laterality: N/A;  . ESOPHAGOGASTRODUODENOSCOPY (EGD) WITH PROPOFOL N/A 02/05/2018   Procedure: ESOPHAGOGASTRODUODENOSCOPY (EGD) WITH PROPOFOL;  Surgeon: Manya Silvas, MD;  Location: [facility] ENDOSCOPY;  Service: Endoscopy;  Laterality: N/A;  . ESOPHAGOGASTRODUODENOSCOPY (EGD) WITH PROPOFOL N/A 07/22/2019   Procedure: ESOPHAGOGASTRODUODENOSCOPY (EGD) WITH PROPOFOL;  Surgeon: [place], Benay Pike, MD;  Location: [facility] ENDOSCOPY;  Service: Gastroenterology;  Laterality: N/A;  . ESOPHAGOGASTRODUODENOSCOPY ENDOSCOPY  05/24/2011  . hemorroid surgery    . JOINT REPLACEMENT  2003  2005   [email or web address]    Medications Prior to Admission  Medication Sig Dispense Refill Last Dose  . alendronate (FOSAMAX) 70 MG tablet Take 70 mg by mouth every Monday. Take with a full glass of water on an empty stomach.     Marland Kitchen aspirin EC 81 MG tablet Take 81 mg by mouth daily.   09/25/2019 at 0730  . blood glucose meter kit and supplies KIT Dispense based on patient and insurance preference. Use up to four times  daily as directed. (FOR ICD-9 250.00, 250.01). 1 each 0   . Calcium Carbonate-Vitamin D (CALCIUM-VITAMIN D) 500-200 MG-UNIT tablet Take 1 tablet by mouth 2 (two) times daily.      . Cranberry-Milk Thistle 250-75 MG CAPS Take 1 capsule by mouth 3 (three) times daily.      . ferrous sulfate 325 (65 FE) MG tablet Take 325 mg by mouth daily.    09/25/2019 at 0730  . folic acid (FOLVITE) 681 MCG tablet Take 400 mcg by  mouth daily.      Marland Kitchen gabapentin (NEURONTIN) 100 MG capsule Take 200 mg by mouth at bedtime.    09/24/2019 at 2100  . levothyroxine (SYNTHROID, LEVOTHROID) 50 MCG tablet Take 50 mcg by mouth daily before breakfast.    09/25/2019 at 0700  . meclizine (ANTIVERT) 12.5 MG tablet Take 12.5 mg by mouth 3 (three) times daily as needed for dizziness.   Unknown at PRN  . Melatonin 10 MG CAPS Take 10 mg by mouth at bedtime.      . metFORMIN (GLUCOPHAGE-XR) 500 MG 24 hr tablet Take 1,000 mg by mouth daily with supper.   09/24/2019 at 1800  . metoprolol tartrate (LOPRESSOR) 25 MG tablet Take 25 mg by mouth in the morning and at bedtime.   09/25/2019 at 0730  . Multiple Vitamin (MULTI-VITAMINS) TABS Take 1 tablet by mouth daily.      Marland Kitchen omeprazole (PRILOSEC) 20 MG capsule Take 20 mg by mouth daily.    09/25/2019 at 0730  . potassium chloride SA (K-DUR,KLOR-CON) 20 MEQ tablet Take 1 tablet (20 mEq total) by mouth 2 (two) times daily. 14 tablet 0 09/25/2019 at 0730  . rifaximin (XIFAXAN) 550 MG TABS tablet Take 550 mg by mouth 2 (two) times daily.    09/25/2019 at 0730  . simvastatin (ZOCOR) 20 MG tablet Take 20 mg by mouth at bedtime.    09/24/2019 at 2100  . torsemide (DEMADEX) 20 MG tablet Take 20 mg by mouth 2 (two) times daily.   09/25/2019 at 0730  . vitamin C (ASCORBIC ACID) 500 MG tablet Take 500 mg by mouth daily.      Marland Kitchen zinc gluconate 50 MG tablet Take 50 mg by mouth daily.      Social History   Socioeconomic History  . Marital status: Married    Spouse name: Not on file  . Number of children: Not on file  . Years of education: Not on file  . Highest education level: Not on file  Occupational History  . Not on file  Tobacco Use  . Smoking status: Never Smoker  . Smokeless tobacco: Never Used  Vaping Use  . Vaping Use: Never used  Substance and Sexual Activity  . Alcohol use: No    Alcohol/week: 0.0 standard drinks  . Drug use: No  . Sexual activity: Not on file  Other Topics Concern  . Not on  file  Social History Narrative  . Not on file   Social Determinants of Health   Financial Resource Strain:   . Difficulty of Paying Living Expenses:   Food Insecurity:   . Worried About Charity fundraiser in the Last Year:   . Arboriculturist in the Last Year:   Transportation Needs:   . Film/video editor (Medical):   Marland Kitchen Lack of Transportation (Non-Medical):   Physical Activity:   . Days of Exercise per Week:   . Minutes of Exercise per Session:   Stress:   .  Feeling of Stress :   Social Connections:   . Frequency of Communication with Friends and Family:   . Frequency of Social Gatherings with Friends and Family:   . Attends Religious Services:   . Active Member of Clubs or Organizations:   . Attends Archivist Meetings:   Marland Kitchen Marital Status:   Intimate Partner Violence:   . Fear of Current or Ex-Partner:   . Emotionally Abused:   Marland Kitchen Physically Abused:   . Sexually Abused:     Family History  Problem Relation Age of Onset  . Heart disease Other   . Hypertension Other   . Anemia Other   . Colon cancer Other   . CVA Mother   . Dementia Mother   . CAD Father   . Breast cancer Neg Hx       Review of systems complete and found to be negative unless listed above      PHYSICAL EXAM  General: Well developed, well nourished, in no acute distress HEENT:  Normocephalic and atramatic Neck:  No JVD.  Lungs: Clear bilaterally to auscultation and percussion. Heart: HRRR . Normal S1 and S2 with S4 gallops or murmurs.  Abdomen: Bowel sounds are positive, abdomen soft and non-tender  Msk:  Back normal, normal gait. Normal strength and tone for age. Extremities: No clubbing, cyanosis or 3+edema.   Neuro: Alert and oriented X 3. Psych:  Good affect, responds appropriately  Labs:   Lab Results  Component Value Date   WBC 4.1 09/25/2019   HGB 10.8 (L) 09/25/2019   HCT 30.8 (L) 09/25/2019   MCV 90.3 09/25/2019   PLT 60 (L) 09/25/2019    Recent Labs  Lab  09/26/19 0559  NA 141  K 3.3*  CL 99  CO2 31  BUN 21  CREATININE 1.02*  CALCIUM 8.5*  GLUCOSE 73   Lab Results  Component Value Date   CKTOTAL 51 04/22/2016   CKMB 1.1 05/25/2011   TROPONINI <0.03 04/22/2016    Lab Results  Component Value Date   CHOL 195 04/23/2016   CHOL 184 11/29/2012   Lab Results  Component Value Date   HDL 43 04/23/2016   HDL 52 11/29/2012   Lab Results  Component Value Date   LDLCALC 138 (H) 04/23/2016   LDLCALC 121 (H) 11/29/2012   Lab Results  Component Value Date   TRIG 70 04/23/2016   TRIG 57 11/29/2012   Lab Results  Component Value Date   CHOLHDL 4.5 04/23/2016   No results found for: LDLDIRECT    Radiology: DG Chest 2 View  Result Date: 09/25/2019 CLINICAL DATA:  Shortness of breath. EXAM: CHEST - 2 VIEW COMPARISON:  07/31/2019 FINDINGS: Cardiopericardial silhouette is at upper limits of normal for size. There is pulmonary vascular congestion without overt pulmonary edema. Right base atelectasis/infiltrate noted with small right pleural effusion. The visualized bony structures of the thorax are intact. IMPRESSION: Pulmonary vascular congestion with right base atelectasis/infiltrate and small right pleural effusion, similar to prior. Electronically Signed   By: Misty Stanley M.D.   On: 09/25/2019 16:41   ECHOCARDIOGRAM COMPLETE  Result Date: 09/26/2019    ECHOCARDIOGRAM REPORT   Patient Name:   Theresa [name] Date of Exam: 09/26/2019 Medical Rec #:  [id number]     Height:       59.0 in Accession #:    [id number]    Weight:       162.9 lb Date of Birth:  May 27, 1940  BSA:          1.690 m Patient Age:    65 years      BP:           101/40 mmHg Patient Gender: F             HR:           66 bpm. Exam Location:  [facility] Procedure: 2D Echo, Cardiac Doppler and Color Doppler Indications:     CHF adute diastolic 361.44  History:         Patient has prior history of Echocardiogram examinations, most                  recent 04/23/2016. CHF, Stroke,  Signs/Symptoms:Shortness of                  Breath; Risk Factors:Diabetes.  Sonographer:     Sherrie Sport RDCS (AE) Referring Phys:  [id number] [place] Diagnosing Phys: Neoma Laming MD IMPRESSIONS  1. Left ventricular ejection fraction, by estimation, is >75%. The left ventricle has hyperdynamic function. The left ventricle has no regional wall motion abnormalities. Left ventricular diastolic parameters are consistent with Grade III diastolic dysfunction (restrictive).  2. Right ventricular systolic function is normal. The right ventricular size is mildly enlarged. There is moderately elevated pulmonary artery systolic pressure.  3. Left atrial size was mildly dilated.  4. Right atrial size was mildly dilated.  5. The mitral valve is normal in structure. Mild to moderate mitral valve regurgitation. No evidence of mitral stenosis.  6. Tricuspid valve regurgitation is mild to moderate.  7. The aortic valve is normal in structure. Aortic valve regurgitation is not visualized. Mild aortic valve sclerosis is present, with no evidence of aortic valve stenosis.  8. The inferior vena cava is normal in size with greater than 50% respiratory variability, suggesting right atrial pressure of 3 mmHg. FINDINGS  Left Ventricle: Left ventricular ejection fraction, by estimation, is >75%. The left ventricle has hyperdynamic function. The left ventricle has no regional wall motion abnormalities. The left ventricular internal cavity size was normal in size. There is no left ventricular hypertrophy. Left ventricular diastolic parameters are consistent with Grade III diastolic dysfunction (restrictive). Right Ventricle: The right ventricular size is mildly enlarged. No increase in right ventricular wall thickness. Right ventricular systolic function is normal. There is moderately elevated pulmonary artery systolic pressure. The tricuspid regurgitant velocity is 3.47 m/s, and with an assumed right atrial pressure of 10 mmHg, the  estimated right ventricular systolic pressure is 67.6 mmHg. Left Atrium: Left atrial size was mildly dilated. Right Atrium: Right atrial size was mildly dilated. Pericardium: There is no evidence of pericardial effusion. Mitral Valve: The mitral valve is normal in structure. Normal mobility of the mitral valve leaflets. Mild to moderate mitral valve regurgitation. No evidence of mitral valve stenosis. Tricuspid Valve: The tricuspid valve is normal in structure. Tricuspid valve regurgitation is mild to moderate. No evidence of tricuspid stenosis. Aortic Valve: The aortic valve is normal in structure. Aortic valve regurgitation is not visualized. Mild aortic valve sclerosis is present, with no evidence of aortic valve stenosis. Aortic valve mean gradient measures 6.0 mmHg. Aortic valve peak gradient measures 10.0 mmHg. Aortic valve area, by VTI measures 2.28 cm. Pulmonic Valve: The pulmonic valve was normal in structure. Pulmonic valve regurgitation is mild. No evidence of pulmonic stenosis. Aorta: The aortic root is normal in size and structure. Venous: The inferior vena cava is normal in size  with greater than 50% respiratory variability, suggesting right atrial pressure of 3 mmHg. IAS/Shunts: No atrial level shunt detected by color flow Doppler.  LEFT VENTRICLE PLAX 2D LVIDd:         3.85 cm  Diastology LVIDs:         2.04 cm  LV e' lateral:   5.98 cm/s LV PW:         1.13 cm  LV E/e' lateral: 20.1 LV IVS:        1.01 cm  LV e' medial:    4.79 cm/s LVOT diam:     2.00 cm  LV E/e' medial:  25.1 LV SV:         85 LV SV Index:   50 LVOT Area:     3.14 cm  RIGHT VENTRICLE RV Basal diam:  3.19 cm RV S prime:     10.30 cm/s TAPSE (M-mode): 2.6 cm LEFT ATRIUM             Index       RIGHT ATRIUM           Index LA diam:        4.50 cm 2.66 cm/m  RA Area:     16.50 cm LA Vol (A2C):   68.8 ml 40.70 ml/m RA Volume:   41.10 ml  24.31 ml/m LA Vol (A4C):   65.3 ml 38.63 ml/m LA Biplane Vol: 67.1 ml 39.69 ml/m  AORTIC  VALVE                    PULMONIC VALVE AV Area (Vmax):    1.79 cm     PV Vmax:        0.68 m/s AV Area (Vmean):   1.73 cm     PV Peak grad:   1.8 mmHg AV Area (VTI):     2.28 cm     RVOT Peak grad: 3 mmHg AV Vmax:           158.00 cm/s AV Vmean:          110.000 cm/s AV VTI:            0.370 m AV Peak Grad:      10.0 mmHg AV Mean Grad:      6.0 mmHg LVOT Vmax:         90.20 cm/s LVOT Vmean:        60.400 cm/s LVOT VTI:          0.269 m LVOT/AV VTI ratio: 0.73  AORTA Ao Root diam: 2.70 cm MITRAL VALVE                TRICUSPID VALVE MV Area (PHT): 3.08 cm     TR Peak grad:   48.2 mmHg MV Decel Time: 246 msec     TR Vmax:        347.00 cm/s MV E velocity: 120.00 cm/s MV A velocity: 70.80 cm/s   SHUNTS MV E/A ratio:  1.69         Systemic VTI:  0.27 m                             Systemic Diam: 2.00 cm Neoma Laming MD Electronically signed by Neoma Laming MD Signature Date/Time: 09/26/2019/11:24:36 AM    Final     EKG: Normal sinus rhythm right bundle branch block incomplete nonspecific ST-T wave changes  ASSESSMENT AND PLAN:  Shortness of  breath Congestive heart failure diastolic dysfunction Leg edema Hypertension 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 Plan Agree with admit to telemetry follow-up EKGs and troponins Rule out for myocardial infarction Recommend diuresis for heart failure Obstructive sleep apnea recommend sleep study CPAP if indicated and weight loss Would recommend long-term anticoagulation for A. fib because of significant [place] score recommend Eliquis 5 mg twice a day which can be started on discharge or in the office on follow-up Continue diabetes management and control Do not recommend invasive strategy at this point but will consider functional study Consider  adding a beta-blocker for diastolic heart dysfunction consider Entresto Consider inhalers potentially pulmonary input for possible lung disease Consider continue omeprazole therapy for reflux symptoms Continue diuretic therapy intravenously Recommend aggressive medical therapy for heart failure I am concerned that this represents probable poor compliance with obstructive sleep apnea chronotropic reducing agents beta-blockers or calcium blockers  Signed: Yolonda Kida MD 09/26/2019, 12:41 PM

## 2019-09-26 NOTE — Progress Notes (Signed)
*  PRELIMINARY RESULTS* Echocardiogram 2D Echocardiogram has been performed.  Theresa [name] 09/26/2019, 7:58 AM

## 2019-09-26 NOTE — TOC Progression Note (Signed)
Transition of Care [facility]) - Progression Note    Patient Details  Name: Theresa [name] MRN: [id number] Date of Birth: Oct 07, 1940  [facility] CM/SW [place], RN Phone Number: 09/26/2019, 1:56 PM  Clinical Narrative:     Went to talk to patient about Heart Failure Screen, she reports having a blood pressure and scale that she checks everyday.  I instructed her to notify her doctor of weight gain of 2 lbs or more.No further TOC needs at this time, please re-consult for new needs.        Expected Discharge Plan and Services                                                 Social Determinants of Health (SDOH) Interventions    Readmission Risk Interventions No flowsheet data found.

## 2019-09-27 LAB — CBC
HCT: 30.9 % — ABNORMAL LOW (ref 36.0–46.0)
Hemoglobin: 10.7 g/dL — ABNORMAL LOW (ref 12.0–15.0)
MCH: 30.9 pg (ref 26.0–34.0)
MCHC: 34.6 g/dL (ref 30.0–36.0)
MCV: 89.3 fL (ref 80.0–100.0)
Platelets: 48 10*3/uL — ABNORMAL LOW (ref 150–400)
RBC: 3.46 MIL/uL — ABNORMAL LOW (ref 3.87–5.11)
RDW: 17 % — ABNORMAL HIGH (ref 11.5–15.5)
WBC: 4.1 10*3/uL (ref 4.0–10.5)
nRBC: 0 % (ref 0.0–0.2)

## 2019-09-27 LAB — BASIC METABOLIC PANEL
Anion gap: 10 (ref 5–15)
BUN: 21 mg/dL (ref 8–23)
CO2: 29 mmol/L (ref 22–32)
Calcium: 9.1 mg/dL (ref 8.9–10.3)
Chloride: 103 mmol/L (ref 98–111)
Creatinine, Ser: 0.83 mg/dL (ref 0.44–1.00)
GFR calc Af Amer: >60 mL/min (ref >60)
GFR calc non Af Amer: >60 mL/min (ref >60)
Glucose, Bld: 64 mg/dL — ABNORMAL LOW (ref 70–99)
Potassium: 3.6 mmol/L (ref 3.5–5.1)
Sodium: 142 mmol/L (ref 135–145)

## 2019-09-27 LAB — GLUCOSE, CAPILLARY
Glucose-Capillary: 72 mg/dL (ref 70–99)
Glucose-Capillary: 147 mg/dL — ABNORMAL HIGH (ref 70–99)

## 2019-09-27 LAB — MAGNESIUM: Magnesium: 2.6 mg/dL — ABNORMAL HIGH (ref 1.7–2.4)

## 2019-09-27 MED ORDER — BENZONATATE 100 MG PO CAPS
100.0000 mg | ORAL_CAPSULE | Freq: Four times a day (QID) | ORAL | 0 refills | Status: DC | PRN
Start: 2019-09-27 — End: 2019-09-27

## 2019-09-27 MED ORDER — SACUBITRIL-VALSARTAN 24-26 MG PO TABS: 0.5000 | ORAL_TABLET | Freq: Two times a day (BID) | ORAL | 0 refills | Status: DC

## 2019-09-27 MED ORDER — INSULIN DETEMIR 100 UNIT/ML FLEXPEN: 20.0000 [IU] | Freq: Every day | SUBCUTANEOUS | 0 refills | Status: DC

## 2019-09-27 MED ORDER — APIXABAN 2.5 MG PO TABS: 2.5000 mg | ORAL_TABLET | Freq: Two times a day (BID) | ORAL | 0 refills | Status: DC

## 2019-09-27 MED ORDER — APIXABAN 2.5 MG PO TABS
2.5000 mg | ORAL_TABLET | Freq: Two times a day (BID) | ORAL | Status: DC
  Filled 2019-09-27: qty 1

## 2019-09-27 MED ORDER — BENZONATATE 100 MG PO CAPS
100.0000 mg | ORAL_CAPSULE | Freq: Four times a day (QID) | ORAL | 0 refills | Status: DC | PRN
Start: 2019-09-27 — End: 2020-03-25

## 2019-09-27 MED ORDER — INSULIN ASPART 100 UNIT/ML ~~LOC~~ SOLN
0.0000 [IU] | Freq: Three times a day (TID) | SUBCUTANEOUS | Status: DC
  Administered 2019-09-27: 1 [IU] via SUBCUTANEOUS
  Filled 2019-09-27: qty 1

## 2019-09-27 MED ORDER — INSULIN DETEMIR 100 UNIT/ML FLEXPEN: 10.0000 [IU] | Freq: Every day | SUBCUTANEOUS | 0 refills | Status: DC

## 2019-09-27 MED ORDER — TORSEMIDE 20 MG PO TABS
20.0000 mg | ORAL_TABLET | Freq: Two times a day (BID) | ORAL | Status: DC
  Administered 2019-09-27: 20 mg via ORAL
  Filled 2019-09-27: qty 1

## 2019-09-27 NOTE — Progress Notes (Signed)
Inpatient Diabetes Program Recommendations  AACE/ADA: New Consensus Statement on Inpatient Glycemic Control (2015)  Target Ranges:  Prepandial:   less than 140 mg/dL      Peak postprandial:   less than 180 mg/dL (1-2 hours)      Critically ill patients:  140 - 180 mg/dL   Lab Results  Component Value Date   GLUCAP 72 09/27/2019   HGBA1C 5.4 07/31/2019    Review of Glycemic Control Results for Theresa [name], Theresa [name] (MRN [id number]) as of 09/27/2019 10:01  Ref. Range 09/26/2019 08:40 09/26/2019 12:43 09/26/2019 16:34 09/26/2019 20:48 09/27/2019 07:59  Glucose-Capillary Latest Ref Range: 70 - 99 mg/dL 85 183 (H) 117 (H) 189 (H) 72   Diabetes history: DM2 Outpatient Diabetes medications: Levemir 20 units qd + Metformin 1 gm q supper Current orders for Inpatient glycemic control: Levemir 20 units + Novolog sensitive correction qid  Inpatient Diabetes Program Recommendations:   -Decrease Levemir to 15 units daily -Discontinue hs Novolog correction Last A1c was 5.4 so may need decrease in insulin on discharge.  Thank you, Zannie Locastro. Egan Sahlin, RN, MSN, CDE  Diabetes Coordinator Inpatient Glycemic Control Team Team Pager [phone] (8am-5pm) 09/27/2019 10:02 AM

## 2019-09-27 NOTE — Discharge Summary (Addendum)
Physician Discharge Summary  Patient ID: Theresa [name] MRN: [id number] DOB/AGE: [birth year] 79 y.o.  Admit date: 09/25/2019 Discharge date: 09/27/2019  Admission Diagnoses:  Discharge Diagnoses:  Active Problems:   Acute CHF ([place])   Type 2 diabetes mellitus with peripheral neuropathy [facility])   Discharged Condition: good  Hospital Course:  JudyHolmesis a79 y.o.Caucasian femalewith a known history of coronary artery disease, asthma, hypertension, dyslipidemia, obstructive sleep apnea and peripheral vascular disease as well as CHF, who presented to the emergency room with acute onset of worseningdyspnea andorthopnea likely and weight gain of 5 pounds over the last week. Mild elevation in BNP, chest x-ray showed pulmonary vascular congestion.  She was giving IV Lasix.  Echocardiogram showed ejection fraction more than 64%, diastolic dysfunction.  Mild to moderate mitral valve and tricuspid valve regurgitation. Patient was treated with diuretics, she has been seen by cardiology.  Her condition has improved.  She appeared to have significant sleep apnea, discussed with cardiology, she will be followed in the cardiology office, they will order sleep study for her.  #1.  Acute on chronic diastolic congestive heart failure. Condition improved, resume home dose torsemide.  Also start Entresto per cardiology recommendation.  2.  Hypokalemia. Potassium normalized.  3.  Essential hypertension. Continue home medicines.  4.  Type 2 diabetes with peripheral neuropathy. Patient has some early morning hypoglycemia.  A separate prescription was sent out to reduce Levemir to 10 units, change to daily instead of at nighttime.  5.  Acute kidney injury. Renal function normalized.  6.  Paroxysmal atrial fibrillation. Not be able to give aniticoagulation due to chronic thrombocytopenia  7.  Thrombocytopenia. Follow-up with PCP as outpatient.  No active bleeding. This is a chronic condition.    8.  Possible OSA.  Sleep study as outpatient.   Eliquis prescribed. I called Theresa [name] to cancel.   Consults: cardiology  Significant Diagnostic Studies:  CHEST - 2 VIEW  COMPARISON:  07/31/2019  FINDINGS: Cardiopericardial silhouette is at upper limits of normal for size. There is pulmonary vascular congestion without overt pulmonary edema. Right base atelectasis/infiltrate noted with small right pleural effusion. The visualized bony structures of the thorax are intact.  IMPRESSION: Pulmonary vascular congestion with right base atelectasis/infiltrate and small right pleural effusion, similar to prior.   Electronically Signed   By: Misty Stanley M.D.   On: 09/25/2019 16:41  Echo:  1. Left ventricular ejection fraction, by estimation, is >75%. The left ventricle has hyperdynamic function. The left ventricle has no regional wall motion abnormalities. Left ventricular diastolic parameters are consistent with Grade III diastolic dysfunction (restrictive). 2. Right ventricular systolic function is normal. The right ventricular size is mildly enlarged. There is moderately elevated pulmonary artery systolic pressure. 3. Left atrial size was mildly dilated. 4. Right atrial size was mildly dilated. 5. The mitral valve is normal in structure. Mild to moderate mitral valve regurgitation. No evidence of mitral stenosis. 6. Tricuspid valve regurgitation is mild to moderate. 7. The aortic valve is normal in structure. Aortic valve regurgitation is not visualized. Mild aortic valve sclerosis is present, with no evidence of aortic valve stenosis. 8. The inferior vena cava is normal in size with greater  Treatments: IV lasix  Discharge Exam: Blood pressure 124/75, pulse 60, temperature 97.7 F (36.5 C), temperature source Oral, resp. rate 17, height 4' 11" (1.499 m), weight 72.8 kg, SpO2 98 %. General appearance: alert and cooperative Resp: clear to auscultation  bilaterally Cardio: Irregular, no murmurs GI: soft, non-tender; bowel  sounds normal; no masses,  no organomegaly Extremities: extremities normal, atraumatic, no cyanosis or edema  Disposition: Discharge disposition: 01-Home or Self Care       Discharge Instructions    Diet - low sodium heart healthy   Complete by: As directed    Increase activity slowly   Complete by: As directed      Allergies as of 09/27/2019      Reactions   Morphine And Related Nausea And Vomiting   Other reaction(s): Nausea And Vomiting   Adhesive [tape] Rash   Other Rash   NEOPRENE, TAPE, BAND-AID TOUGH STRIPS      Medication List    STOP taking these medications   folic acid 732 MCG tablet Commonly known as: FOLVITE   metFORMIN 500 MG 24 hr tablet Commonly known as: GLUCOPHAGE-XR   Multi-Vitamins Tabs   vitamin C 500 MG tablet Commonly known as: ASCORBIC ACID   zinc gluconate 50 MG tablet     TAKE these medications   alendronate 70 MG tablet Commonly known as: FOSAMAX Take 70 mg by mouth every Monday. Take with a full glass of water on an empty stomach.   apixaban 2.5 MG Tabs tablet Commonly known as: ELIQUIS Take 1 tablet (2.5 mg total) by mouth 2 (two) times daily.   aspirin EC 81 MG tablet Take 81 mg by mouth daily.   blood glucose meter kit and supplies Kit Dispense based on patient and insurance preference. Use up to four times daily as directed. (FOR ICD-9 250.00, 250.01).   calcium-vitamin D 500-200 MG-UNIT tablet Take 1 tablet by mouth 2 (two) times daily.   Cranberry-Milk Thistle 250-75 MG Caps Take 1 capsule by mouth 3 (three) times daily.   ferrous sulfate 325 (65 FE) MG tablet Take 325 mg by mouth daily.   gabapentin 100 MG capsule Commonly known as: NEURONTIN Take 200 mg by mouth at bedtime.   insulin detemir 100 unit/ml Soln Commonly known as: LEVEMIR Inject 0.2 mLs (20 Units total) into the skin at bedtime.   levothyroxine 50 MCG tablet Commonly known  as: SYNTHROID Take 50 mcg by mouth daily before breakfast.   meclizine 12.5 MG tablet Commonly known as: ANTIVERT Take 12.5 mg by mouth 3 (three) times daily as needed for dizziness.   Melatonin 10 MG Caps Take 10 mg by mouth at bedtime.   metoprolol tartrate 25 MG tablet Commonly known as: LOPRESSOR Take 25 mg by mouth in the morning and at bedtime.   omeprazole 20 MG capsule Commonly known as: PRILOSEC Take 20 mg by mouth daily.   potassium chloride SA 20 MEQ tablet Commonly known as: KLOR-CON Take 1 tablet (20 mEq total) by mouth 2 (two) times daily.   sacubitril-valsartan 24-26 MG Commonly known as: ENTRESTO Take 0.5 tablets by mouth 2 (two) times daily.   simvastatin 20 MG tablet Commonly known as: ZOCOR Take 20 mg by mouth at bedtime.   torsemide 20 MG tablet Commonly known as: DEMADEX Take 20 mg by mouth 2 (two) times daily.   Xifaxan 550 MG Tabs tablet Generic drug: rifaximin Take 550 mg by mouth 2 (two) times daily.       Follow-up Information    [facility] Follow up on 10/04/2019.   Specialty: Cardiology Why: at 9:30am. Enter through the [place] entrance Contact information: [place] [address] [phone]       Yolonda Kida, MD Follow up in  1 week(s).   Specialties: Cardiology, Internal Medicine Contact information: [place] [address] [phone]        Tracie Harrier, MD Follow up in 1 week(s).   Specialty: Internal Medicine Contact information: [place] [address] [phone]             Please note: Levemir dose is changed to 10 units daily.   More than 35 minutes  Signed: Sharen Hones 09/27/2019, 9:57 AM

## 2019-09-27 NOTE — Discharge Instructions (Signed)
1.  Follow-up with PCP in 1 week. 2.  Follow-up was cardiology in 1 to 2 weeks. 3.  Do not drink too much water, prefer 1500 mL/day.  Allow increased water intake for hot weather. 4.  Schedule outpatient sleep study for possible obstructive sleep apnea. 5.  Measure body weight daily, call PCP or cardiology if weight gain more than 2 pounds.

## 2019-09-27 NOTE — Care Management Important Message (Signed)
Important Message  Patient Details  Name: Theresa [name] MRN: [id number] Date of Birth: [birth year]   Medicare Important Message Given:  Yes  Initial Medicare IM given by Patient Access Associate on 09/26/2019 at 10:00am.     Dannette Barbara 09/27/2019, 8:38 AM

## 2019-09-27 NOTE — Progress Notes (Signed)
Went to give new eliquis.  Pill fell on floor.   When I went to retrieve a new pill- pharmacist stopped me.  Reported Platelets did fall below 60 (below recommended levels per Cardiology).  Will hold eliquis and educate patient

## 2019-09-27 NOTE — Progress Notes (Signed)
[facility] Cardiology    SUBJECTIVE: Patient feels much better reduced shortness of breath reduce leg edema no chest pain no palpitations or tachycardia patient feels well enough to probably go home will make changes in medications to help with symptom management control.  Patient states that she has been noncompliant with her CPAP for obstructive sleep apnea.  Her last sleep study was around 15 years ago   Vitals:   09/26/19 1538 09/26/19 1936 09/27/19 0446 09/27/19 0757  BP: (!) 125/58 128/69 (!) 118/54 124/75  Pulse: 65 68 (!) 53 60  Resp: 17 20 20 17   Temp: 98 F (36.7 C) 98.1 F (36.7 C) 97.9 F (36.6 C) 97.7 F (36.5 C)  TempSrc: Oral Oral Oral Oral  SpO2: 98% 97% 96% 98%  Weight:   72.8 kg   Height:        No intake or output data in the 24 hours ending 09/27/19 0933    PHYSICAL EXAM  General: Well developed, well nourished, in no acute distress HEENT:  Normocephalic and atramatic Neck:  No JVD.  Lungs: Clear bilaterally to auscultation and percussion. Heart: HRRR . Normal S1 and S2 without gallops or murmurs.  Abdomen: Bowel sounds are positive, abdomen soft and non-tender  Msk:  Back normal, normal gait. Normal strength and tone for age. Extremities: No clubbing, cyanosis or edema.   Neuro: Alert and oriented X 3. Psych:  Good affect, responds appropriately   LABS: Basic Metabolic Panel: Recent Labs    09/26/19 0559 09/27/19 0428  NA 141 142  K 3.3* 3.6  CL 99 103  CO2 31 29  GLUCOSE 73 64*  BUN 21 21  CREATININE 1.02* 0.83  CALCIUM 8.5* 9.1  MG  --  2.6*   Liver Function Tests: No results for input(s): AST, ALT, ALKPHOS, BILITOT, PROT, ALBUMIN in the last 72 hours. No results for input(s): LIPASE, AMYLASE in the last 72 hours. CBC: Recent Labs    09/25/19 1608  WBC 4.1  HGB 10.8*  HCT 30.8*  MCV 90.3  PLT 60*   Cardiac Enzymes: No results for input(s): CKTOTAL, CKMB, CKMBINDEX, TROPONINI in the last 72 hours. BNP: Invalid input(s):  POCBNP D-Dimer: No results for input(s): DDIMER in the last 72 hours. Hemoglobin A1C: No results for input(s): HGBA1C in the last 72 hours. Fasting Lipid Panel: No results for input(s): CHOL, HDL, LDLCALC, TRIG, CHOLHDL, LDLDIRECT in the last 72 hours. Thyroid Function Tests: No results for input(s): TSH, T4TOTAL, T3FREE, THYROIDAB in the last 72 hours.  Invalid input(s): FREET3 Anemia Panel: No results for input(s): VITAMINB12, FOLATE, FERRITIN, TIBC, IRON, RETICCTPCT in the last 72 hours.  DG Chest 2 View  Result Date: 09/25/2019 CLINICAL DATA:  Shortness of breath. EXAM: CHEST - 2 VIEW COMPARISON:  07/31/2019 FINDINGS: Cardiopericardial silhouette is at upper limits of normal for size. There is pulmonary vascular congestion without overt pulmonary edema. Right base atelectasis/infiltrate noted with small right pleural effusion. The visualized bony structures of the thorax are intact. IMPRESSION: Pulmonary vascular congestion with right base atelectasis/infiltrate and small right pleural effusion, similar to prior. Electronically Signed   By: Misty Stanley M.D.   On: 09/25/2019 16:41   ECHOCARDIOGRAM COMPLETE  Result Date: 09/26/2019    ECHOCARDIOGRAM REPORT   Patient Name:   ALEXEI EY Date of Exam: 09/26/2019 Medical Rec #:  [id number]     Height:       59.0 in Accession #:    [id number]    Weight:  162.9 lb Date of Birth:  10/25/1940      BSA:          1.690 m Patient Age:    79 years      BP:           101/40 mmHg Patient Gender: F             HR:           66 bpm. Exam Location:  [facility] Procedure: 2D Echo, Cardiac Doppler and Color Doppler Indications:     CHF adute diastolic 728.20  History:         Patient has prior history of Echocardiogram examinations, most                  recent 04/23/2016. CHF, Stroke, Signs/Symptoms:Shortness of                  Breath; Risk Factors:Diabetes.  Sonographer:     Sherrie Sport RDCS (AE) Referring Phys:  [id number] [place] Diagnosing Phys: Neoma Laming  MD IMPRESSIONS  1. Left ventricular ejection fraction, by estimation, is >75%. The left ventricle has hyperdynamic function. The left ventricle has no regional wall motion abnormalities. Left ventricular diastolic parameters are consistent with Grade III diastolic dysfunction (restrictive).  2. Right ventricular systolic function is normal. The right ventricular size is mildly enlarged. There is moderately elevated pulmonary artery systolic pressure.  3. Left atrial size was mildly dilated.  4. Right atrial size was mildly dilated.  5. The mitral valve is normal in structure. Mild to moderate mitral valve regurgitation. No evidence of mitral stenosis.  6. Tricuspid valve regurgitation is mild to moderate.  7. The aortic valve is normal in structure. Aortic valve regurgitation is not visualized. Mild aortic valve sclerosis is present, with no evidence of aortic valve stenosis.  8. The inferior vena cava is normal in size with greater than 50% respiratory variability, suggesting right atrial pressure of 3 mmHg. FINDINGS  Left Ventricle: Left ventricular ejection fraction, by estimation, is >75%. The left ventricle has hyperdynamic function. The left ventricle has no regional wall motion abnormalities. The left ventricular internal cavity size was normal in size. There is no left ventricular hypertrophy. Left ventricular diastolic parameters are consistent with Grade III diastolic dysfunction (restrictive). Right Ventricle: The right ventricular size is mildly enlarged. No increase in right ventricular wall thickness. Right ventricular systolic function is normal. There is moderately elevated pulmonary artery systolic pressure. The tricuspid regurgitant velocity is 3.47 m/s, and with an assumed right atrial pressure of 10 mmHg, the estimated right ventricular systolic pressure is 37.9 mmHg. Left Atrium: Left atrial size was mildly dilated. Right Atrium: Right atrial size was mildly dilated. Pericardium: There is no  evidence of pericardial effusion. Mitral Valve: The mitral valve is normal in structure. Normal mobility of the mitral valve leaflets. Mild to moderate mitral valve regurgitation. No evidence of mitral valve stenosis. Tricuspid Valve: The tricuspid valve is normal in structure. Tricuspid valve regurgitation is mild to moderate. No evidence of tricuspid stenosis. Aortic Valve: The aortic valve is normal in structure. Aortic valve regurgitation is not visualized. Mild aortic valve sclerosis is present, with no evidence of aortic valve stenosis. Aortic valve mean gradient measures 6.0 mmHg. Aortic valve peak gradient measures 10.0 mmHg. Aortic valve area, by VTI measures 2.28 cm. Pulmonic Valve: The pulmonic valve was normal in structure. Pulmonic valve regurgitation is mild. No evidence of pulmonic stenosis. Aorta: The aortic root is normal in  size and structure. Venous: The inferior vena cava is normal in size with greater than 50% respiratory variability, suggesting right atrial pressure of 3 mmHg. IAS/Shunts: No atrial level shunt detected by color flow Doppler.  LEFT VENTRICLE PLAX 2D LVIDd:         3.85 cm  Diastology LVIDs:         2.04 cm  LV e' lateral:   5.98 cm/s LV PW:         1.13 cm  LV E/e' lateral: 20.1 LV IVS:        1.01 cm  LV e' medial:    4.79 cm/s LVOT diam:     2.00 cm  LV E/e' medial:  25.1 LV SV:         85 LV SV Index:   50 LVOT Area:     3.14 cm  RIGHT VENTRICLE RV Basal diam:  3.19 cm RV S prime:     10.30 cm/s TAPSE (M-mode): 2.6 cm LEFT ATRIUM             Index       RIGHT ATRIUM           Index LA diam:        4.50 cm 2.66 cm/m  RA Area:     16.50 cm LA Vol (A2C):   68.8 ml 40.70 ml/m RA Volume:   41.10 ml  24.31 ml/m LA Vol (A4C):   65.3 ml 38.63 ml/m LA Biplane Vol: 67.1 ml 39.69 ml/m  AORTIC VALVE                    PULMONIC VALVE AV Area (Vmax):    1.79 cm     PV Vmax:        0.68 m/s AV Area (Vmean):   1.73 cm     PV Peak grad:   1.8 mmHg AV Area (VTI):     2.28 cm      RVOT Peak grad: 3 mmHg AV Vmax:           158.00 cm/s AV Vmean:          110.000 cm/s AV VTI:            0.370 m AV Peak Grad:      10.0 mmHg AV Mean Grad:      6.0 mmHg LVOT Vmax:         90.20 cm/s LVOT Vmean:        60.400 cm/s LVOT VTI:          0.269 m LVOT/AV VTI ratio: 0.73  AORTA Ao Root diam: 2.70 cm MITRAL VALVE                TRICUSPID VALVE MV Area (PHT): 3.08 cm     TR Peak grad:   48.2 mmHg MV Decel Time: 246 msec     TR Vmax:        347.00 cm/s MV E velocity: 120.00 cm/s MV A velocity: 70.80 cm/s   SHUNTS MV E/A ratio:  1.69         Systemic VTI:  0.27 m                             Systemic Diam: 2.00 cm Neoma Laming MD Electronically signed by Neoma Laming MD Signature Date/Time: 09/26/2019/11:24:36 AM    Final      Echo preserved left ventricular function hyperdynamic with  diastolic dysfunction EF greater than 70%  TELEMETRY: Normal sinus rhythm nonspecific sttw changes:  ASSESSMENT AND PLAN:  Active Problems:   Acute CHF ([facility])   Type 2 diabetes mellitus with peripheral neuropathy ([facility]) Shortness of breath Leg edema Hypertension Coronary artery disease History of coronary bypass surgery GERD Obstructive sleep apnea Thrombocytopenia . Plan Patient has significant improvement in leg edema no shortness of breath Recommend advance dose of beta-blockers to help with diastolic dysfunction Continue diuretics Demadex 20 mg twice a day in place of Lasix Discontinue ACE inhibitor both consider switching to Entresto 24/26 twice a day Recommend sleep study CPAP if indicated encourage compliance Recommend long-term anticoagulation with Eliquis for paroxysmal atrial fibrillation because of severity CHADS2 score would recommend 2.5 twice a day the evidence of thrombocytopenia is concerning well platelet count is over 60 if she drops significantly more than would consider discontinuing anticoagulation Continue omeprazole therapy for reflux type symptoms Recommend support stockings  elevation for peripheral edema Continue diabetes medication and control Patient should be stable for discharge follow-up with cardiology Dr. Nehemiah Massed 1 to 2 weeks     Yolonda Kida, MD 09/27/2019 9:33 AM

## 2019-10-03 NOTE — Progress Notes (Signed)
Patient ID: Theresa [name], female    DOB: May 14, 1940, 79 y.o.   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   Echo report from 09/26/19 reviewed and showed an EF of >75% along with moderately elevated PA pressure, mild LAE and mild/moderate MR/TR.   Admitted 09/25/19 due to acute on chronic HF. Cardiology consult obtained. Initially given IV lasix with transition to oral diuretics. Unable to anticoagulate (for AF) due to chronic thrombocytopenia. Discharged after 2 days.   She presents today for her initial visit with a chief complaint of moderate shortness of breath upon minimal exertion. She describes this as chronic in nature having been present for several years. She has associated fatigue, chest tightness, cough, light-headedness, pedal edema and difficulty sleeping along with this.   Past Medical History:  Diagnosis Date  . Anemia   . Arthritis   . Asthma   . Bradycardia   . CAD (coronary artery disease)    BIL.CAROTID ARTERY STENOSIS  . CHF (congestive heart failure) ([place])   . Chronic low back pain   . Cirrhosis of liver not due to alcohol ([place])   . Colon polyps   . Diabetes mellitus without complication ([place])   . Dyspnea   . Esophageal varices without bleeding ([place])   . GERD (gastroesophageal reflux disease)   . Hip pain   . Hyperlipidemia   . Hypertension   . IDA (iron deficiency anemia)   . 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 Sleep apnea   . SOB (shortness of breath)   . Stroke ([place])   . Temporal arteritis ([place])   . Thrombocytopenia ([place])   . Thrombocytopenia ([place])    Past Surgical History:  Procedure Laterality Date  . COLONOSCOPY  10/24/2013, 2002   hyperplastic polpys  . CORONARY ANGIOPLASTY    . CORONARY ARTERY BYPASS GRAFT  09/1998  .  ESOPHAGOGASTRODUODENOSCOPY (EGD) WITH PROPOFOL N/A 08/26/2016   Procedure: ESOPHAGOGASTRODUODENOSCOPY (EGD) WITH PROPOFOL;  Surgeon: Manya Silvas, MD;  Location: [facility] ENDOSCOPY;  Service: Endoscopy;  Laterality: N/A;  . ESOPHAGOGASTRODUODENOSCOPY (EGD) WITH PROPOFOL N/A 02/05/2018   Procedure: ESOPHAGOGASTRODUODENOSCOPY (EGD) WITH PROPOFOL;  Surgeon: Manya Silvas, MD;  Location: [facility] ENDOSCOPY;  Service: Endoscopy;  Laterality: N/A;  . ESOPHAGOGASTRODUODENOSCOPY (EGD) WITH PROPOFOL N/A 07/22/2019   Procedure: ESOPHAGOGASTRODUODENOSCOPY (EGD) WITH PROPOFOL;  Surgeon: Toledo, Benay Pike, MD;  Location: [facility] ENDOSCOPY;  Service: Gastroenterology;  Laterality: N/A;  . ESOPHAGOGASTRODUODENOSCOPY ENDOSCOPY  05/24/2011  . hemorroid surgery    . JOINT REPLACEMENT  2003  2005   [email or web address]   Family History  Problem Relation Age of Onset  . Heart disease Other   . Hypertension Other   . Anemia Other   . Colon cancer Other   . CVA Mother   . Dementia Mother   . CAD Father   . Breast cancer Neg Hx    Social History   Tobacco Use  . Smoking status: Never Smoker  . Smokeless tobacco: Never Used  Substance Use Topics  . Alcohol use: No    Alcohol/week: 0.0 standard drinks   Allergies  Allergen Reactions  . Morphine And Related Nausea And Vomiting    Other reaction(s): Nausea And Vomiting  . Adhesive [Tape] Rash  . Other Rash    NEOPRENE, TAPE, BAND-AID TOUGH STRIPS  Prior to Admission medications   Medication Sig Start Date End Date Taking? Authorizing Provider  alendronate (FOSAMAX) 70 MG tablet Take 70 mg by mouth every Monday. Take with a full glass of water on an empty stomach.   Yes [provider]  aspirin EC 81 MG tablet Take 81 mg by mouth daily.   Yes [provider]  benzonatate (TESSALON PERLES) 100 MG capsule Take 1 capsule (100 mg total) by mouth every 6 (six) hours as needed for cough. 09/27/19 09/26/20 Yes Sharen Hones, MD  blood glucose meter kit and  supplies KIT Dispense based on patient and insurance preference. Use up to four times daily as directed. (FOR ICD-9 250.00, 250.01). 04/24/16  Yes Gladstone Lighter, MD  Calcium Carbonate-Vitamin D (CALCIUM-VITAMIN D) 500-200 MG-UNIT tablet Take 1 tablet by mouth 2 (two) times daily.    Yes [provider]  Cranberry-Milk Thistle 250-75 MG CAPS Take 1 capsule by mouth 3 (three) times daily.    Yes [provider]  ferrous sulfate 325 (65 FE) MG tablet Take 325 mg by mouth daily.    Yes [provider]  gabapentin (NEURONTIN) 100 MG capsule Take 200 mg by mouth at bedtime.  01/26/15  Yes [provider]  levothyroxine (SYNTHROID, LEVOTHROID) 50 MCG tablet Take 50 mcg by mouth daily before breakfast.  03/31/15  Yes [provider]  meclizine (ANTIVERT) 12.5 MG tablet Take 12.5 mg by mouth 3 (three) times daily as needed for dizziness.   Yes [provider]  Melatonin 10 MG CAPS Take 10 mg by mouth at bedtime.    Yes [provider]  metFORMIN (GLUCOPHAGE) 500 MG tablet Take 1,000 mg by mouth daily. In evening   Yes [provider]  metoprolol tartrate (LOPRESSOR) 25 MG tablet Take 25 mg by mouth in the morning and at bedtime. 07/22/19  Yes [provider]  omeprazole (PRILOSEC) 20 MG capsule Take 20 mg by mouth daily.  03/24/15  Yes [provider]  potassium chloride SA (K-DUR,KLOR-CON) 20 MEQ tablet Take 1 tablet (20 mEq total) by mouth 2 (two) times daily. 11/22/16  Yes Cammie Sickle, MD  rifaximin (XIFAXAN) 550 MG TABS tablet Take 550 mg by mouth 2 (two) times daily.  01/20/15  Yes [provider]  sacubitril-valsartan (ENTRESTO) 24-26 MG Take 0.5 tablets by mouth 2 (two) times daily. 09/27/19  Yes Sharen Hones, MD  simvastatin (ZOCOR) 20 MG tablet Take 20 mg by mouth at bedtime.  03/24/15  Yes [provider]  torsemide (DEMADEX) 20 MG tablet Take 20 mg by mouth 2 (two) times daily.   Yes  [provider]    Review of Systems  Constitutional: Positive for fatigue. Negative for appetite change.  HENT: Positive for congestion and rhinorrhea. Negative for sore throat.   Eyes: Negative.   Respiratory: Positive for cough (dry; worse at night), chest tightness (heaviness on right upper chest at night) and shortness of breath (easily).   Cardiovascular: Positive for leg swelling. Negative for chest pain and palpitations.  Gastrointestinal: Negative for abdominal distention and abdominal pain.  Endocrine: Negative.   Genitourinary: Negative.   Musculoskeletal: Positive for arthralgias (shoulder pain). Negative for neck pain.  Skin: Negative.   Allergic/Immunologic: Negative.   Neurological: Positive for light-headedness. Negative for dizziness.  Hematological: Negative for adenopathy. Bruises/bleeds easily.  Psychiatric/Behavioral: Positive for sleep disturbance (sleeping on wedge and pillows). Negative for dysphoric mood. The patient is not nervous/anxious.    Vitals:   10/04/19  0937  BP: (!) 108/52  Pulse: 61  Resp: 18  SpO2: 96%  Weight: 162 lb 6 oz (73.7 kg)  Height: 5' (1.524 m)   Wt Readings from Last 3 Encounters:  10/04/19 162 lb 6 oz (73.7 kg)  09/27/19 160 lb 6.4 oz (72.8 kg)  08/01/19 165 lb 14.4 oz (75.3 kg)   Lab Results  Component Value Date   CREATININE 0.83 09/27/2019   CREATININE 1.02 (H) 09/26/2019   CREATININE 1.19 (H) 09/25/2019   Physical Exam Vitals and nursing note reviewed.  Constitutional:      Appearance: Normal appearance.  HENT:     Head: Normocephalic and atraumatic.  Cardiovascular:     Rate and Rhythm: Normal rate and regular rhythm.  Pulmonary:     Effort: Pulmonary effort is normal.     Breath sounds: Normal breath sounds. No wheezing or rales.  Abdominal:     Palpations: Abdomen is soft.     Tenderness: There is no abdominal tenderness.  Musculoskeletal:        General: No tenderness.     Cervical back: Normal  range of motion and neck supple.     Right lower leg: Edema (1+ pitting) present.     Left lower leg: Edema (1+ pitting) present.  Skin:    General: Skin is warm and dry.  Neurological:     General: No focal deficit present.     Mental Status: She is alert and oriented to person, place, and time.  Psychiatric:        Mood and Affect: Mood normal.        Behavior: Behavior normal.        Thought Content: Thought content normal.     Assessment & Plan:  1: Chronic heart failure with preserved ejection fraction along with structural changes (LAE)- - NYHA class III - euvolemic today - weighing daily and she says that her weight has been stable; reminded to call for an overnight weight gain of >2 pounds or a weekly weight gain of >5 pounds - not adding salt and trying to follow a low sodium diet; written dietary information was given to patient - saw cardiology Nehemiah Massed) 06/10/19 - BP will not tolerate titration of entresto - BNP 09/25/19 was 221.8 - has received both COVID vaccines  2: HTN- - BP looks good although on the low side - saw PCP (Hande) 09/25/19 - BMP 09/27/19 reviewed and showed sodium 142, potassium 3.6, creatinine 0.83 and GFR >60  3: DM- - A1c 07/31/19 was  5.4% - glucose at home today was 108  4: Lymphedema- - stage 2 - unable to exercise due to symptoms - does elevate her legs but doesn't think her edema improves with that - has tried compression socks in the past but admits that she wasn't consistent with wearing them due to difficulty in getting them on; encouraged her and her husband to try and get them on daily if possible - consider lymphapress compression boots if edema persists   Medication list was reviewed.   Return in 2 months or sooner for any questions/problems before then.

## 2019-10-04 ENCOUNTER — Encounter: Payer: Self-pay | Admitting: Family

## 2019-10-04 ENCOUNTER — Ambulatory Visit: Payer: Medicare Other | Attending: Family | Admitting: Family

## 2019-10-04 ENCOUNTER — Other Ambulatory Visit: Payer: Self-pay

## 2019-10-04 VITALS — BP 108/52 | HR 61 | Resp 18 | Ht 60.0 in | Wt 162.4 lb

## 2019-10-04 DIAGNOSIS — Z888 Allergy status to other drugs, medicaments and biological substances status: Secondary | ICD-10-CM | POA: Insufficient documentation

## 2019-10-04 DIAGNOSIS — I5032 Chronic diastolic (congestive) heart failure: Secondary | ICD-10-CM | POA: Diagnosis present

## 2019-10-04 DIAGNOSIS — K219 Gastro-esophageal reflux disease without esophagitis: Secondary | ICD-10-CM | POA: Diagnosis not present

## 2019-10-04 DIAGNOSIS — K7460 Unspecified cirrhosis of liver: Secondary | ICD-10-CM | POA: Diagnosis not present

## 2019-10-04 DIAGNOSIS — I110 Hypertensive heart disease with heart failure: Secondary | ICD-10-CM | POA: Diagnosis not present

## 2019-10-04 DIAGNOSIS — E119 Type 2 diabetes mellitus without complications: Secondary | ICD-10-CM | POA: Insufficient documentation

## 2019-10-04 DIAGNOSIS — E1142 Type 2 diabetes mellitus with diabetic polyneuropathy: Secondary | ICD-10-CM

## 2019-10-04 DIAGNOSIS — I2510 Atherosclerotic heart disease of native coronary artery without angina pectoris: Secondary | ICD-10-CM | POA: Diagnosis not present

## 2019-10-04 DIAGNOSIS — Z885 Allergy status to narcotic agent status: Secondary | ICD-10-CM | POA: Insufficient documentation

## 2019-10-04 DIAGNOSIS — Z79899 Other long term (current) drug therapy: Secondary | ICD-10-CM | POA: Insufficient documentation

## 2019-10-04 DIAGNOSIS — G4730 Sleep apnea, unspecified: Secondary | ICD-10-CM | POA: Diagnosis not present

## 2019-10-04 DIAGNOSIS — Z7984 Long term (current) use of oral hypoglycemic drugs: Secondary | ICD-10-CM | POA: Diagnosis not present

## 2019-10-04 DIAGNOSIS — J45909 Unspecified asthma, uncomplicated: Secondary | ICD-10-CM | POA: Diagnosis not present

## 2019-10-04 DIAGNOSIS — Z8249 Family history of ischemic heart disease and other diseases of the circulatory system: Secondary | ICD-10-CM | POA: Diagnosis not present

## 2019-10-04 DIAGNOSIS — E785 Hyperlipidemia, unspecified: Secondary | ICD-10-CM | POA: Insufficient documentation

## 2019-10-04 DIAGNOSIS — Z8673 Personal history of transient ischemic attack (TIA), and cerebral infarction without residual deficits: Secondary | ICD-10-CM | POA: Insufficient documentation

## 2019-10-04 DIAGNOSIS — Z7982 Long term (current) use of aspirin: Secondary | ICD-10-CM | POA: Insufficient documentation

## 2019-10-04 DIAGNOSIS — M1990 Unspecified osteoarthritis, unspecified site: Secondary | ICD-10-CM | POA: Insufficient documentation

## 2019-10-04 DIAGNOSIS — Z951 Presence of aortocoronary bypass graft: Secondary | ICD-10-CM | POA: Diagnosis not present

## 2019-10-04 DIAGNOSIS — I890 Lymphedema, not elsewhere classified: Secondary | ICD-10-CM | POA: Diagnosis not present

## 2019-10-04 DIAGNOSIS — Z86010 Personal history of colonic polyps: Secondary | ICD-10-CM | POA: Insufficient documentation

## 2019-10-04 DIAGNOSIS — I10 Essential (primary) hypertension: Secondary | ICD-10-CM

## 2019-10-04 NOTE — Patient Instructions (Signed)
Continue weighing daily and call for an overnight weight gain of > 2 pounds or a weekly weight gain of >5 pounds. 

## 2019-10-31 ENCOUNTER — Ambulatory Visit: Payer: Medicare Other | Admitting: Family

## 2019-10-31 ENCOUNTER — Ambulatory Visit
Admission: RE | Admit: 2019-10-31 | Discharge: 2019-10-31 | Disposition: A | Discharge status: Home / Self Care | Payer: Medicare Other | Source: Ambulatory Visit | Attending: Family | Admitting: Family

## 2019-10-31 ENCOUNTER — Encounter: Payer: Self-pay | Admitting: Family

## 2019-10-31 ENCOUNTER — Other Ambulatory Visit: Payer: Self-pay

## 2019-10-31 ENCOUNTER — Other Ambulatory Visit: Payer: Self-pay | Admitting: Family

## 2019-10-31 VITALS — BP 136/68 | HR 65 | Resp 18 | Ht 59.0 in | Wt 160.0 lb

## 2019-10-31 DIAGNOSIS — I5023 Acute on chronic systolic (congestive) heart failure: Secondary | ICD-10-CM

## 2019-10-31 DIAGNOSIS — I2510 Atherosclerotic heart disease of native coronary artery without angina pectoris: Secondary | ICD-10-CM | POA: Insufficient documentation

## 2019-10-31 DIAGNOSIS — Z951 Presence of aortocoronary bypass graft: Secondary | ICD-10-CM | POA: Insufficient documentation

## 2019-10-31 DIAGNOSIS — I110 Hypertensive heart disease with heart failure: Secondary | ICD-10-CM | POA: Insufficient documentation

## 2019-10-31 DIAGNOSIS — G4730 Sleep apnea, unspecified: Secondary | ICD-10-CM | POA: Insufficient documentation

## 2019-10-31 DIAGNOSIS — Z7982 Long term (current) use of aspirin: Secondary | ICD-10-CM | POA: Insufficient documentation

## 2019-10-31 DIAGNOSIS — Z8249 Family history of ischemic heart disease and other diseases of the circulatory system: Secondary | ICD-10-CM | POA: Insufficient documentation

## 2019-10-31 DIAGNOSIS — I890 Lymphedema, not elsewhere classified: Secondary | ICD-10-CM | POA: Insufficient documentation

## 2019-10-31 DIAGNOSIS — E119 Type 2 diabetes mellitus without complications: Secondary | ICD-10-CM | POA: Insufficient documentation

## 2019-10-31 DIAGNOSIS — Z8673 Personal history of transient ischemic attack (TIA), and cerebral infarction without residual deficits: Secondary | ICD-10-CM | POA: Insufficient documentation

## 2019-10-31 DIAGNOSIS — Z885 Allergy status to narcotic agent status: Secondary | ICD-10-CM | POA: Insufficient documentation

## 2019-10-31 DIAGNOSIS — Z79899 Other long term (current) drug therapy: Secondary | ICD-10-CM | POA: Insufficient documentation

## 2019-10-31 DIAGNOSIS — K7469 Other cirrhosis of liver: Secondary | ICD-10-CM

## 2019-10-31 DIAGNOSIS — K7460 Unspecified cirrhosis of liver: Secondary | ICD-10-CM | POA: Insufficient documentation

## 2019-10-31 DIAGNOSIS — E1142 Type 2 diabetes mellitus with diabetic polyneuropathy: Secondary | ICD-10-CM

## 2019-10-31 DIAGNOSIS — I10 Essential (primary) hypertension: Secondary | ICD-10-CM

## 2019-10-31 DIAGNOSIS — E785 Hyperlipidemia, unspecified: Secondary | ICD-10-CM | POA: Insufficient documentation

## 2019-10-31 DIAGNOSIS — Z7984 Long term (current) use of oral hypoglycemic drugs: Secondary | ICD-10-CM | POA: Insufficient documentation

## 2019-10-31 DIAGNOSIS — K219 Gastro-esophageal reflux disease without esophagitis: Secondary | ICD-10-CM | POA: Insufficient documentation

## 2019-10-31 DIAGNOSIS — I5033 Acute on chronic diastolic (congestive) heart failure: Secondary | ICD-10-CM

## 2019-10-31 MED ORDER — POTASSIUM CHLORIDE CRYS ER 20 MEQ PO TBCR
EXTENDED_RELEASE_TABLET | ORAL | Status: AC
  Administered 2019-10-31: 40 meq via ORAL
  Filled 2019-10-31: qty 2

## 2019-10-31 MED ORDER — POTASSIUM CHLORIDE CRYS ER 20 MEQ PO TBCR: 40.0000 meq | EXTENDED_RELEASE_TABLET | Freq: Once | ORAL | Status: AC

## 2019-10-31 MED ORDER — FUROSEMIDE 10 MG/ML IJ SOLN
INTRAMUSCULAR | Status: AC
  Administered 2019-10-31: 80 mg via INTRAVENOUS
  Filled 2019-10-31: qty 8

## 2019-10-31 MED ORDER — FUROSEMIDE 10 MG/ML IJ SOLN: 80.0000 mg | Freq: Once | INTRAMUSCULAR | Status: AC

## 2019-10-31 NOTE — Progress Notes (Signed)
Patient ID: Theresa [name], female    DOB: Mar 10, 1941, 79 y.o.   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   Echo report from 09/26/19 reviewed and showed an EF of >75% along with moderately elevated PA pressure, mild LAE and mild/moderate MR/TR.   Admitted 09/25/19 due to acute on chronic HF. Cardiology consult obtained. Initially given IV lasix with transition to oral diuretics. Unable to anticoagulate (for AF) due to chronic thrombocytopenia. Discharged after 2 days.   She presents today for an acute visit with a chief complaint of shortness of breath, cough and swelling. Patient, husband and son say that she's been coughing for at least a week and that now her rib cage hurts from coughing so much. She has associated fatigue, chest tightness, light-headedness, difficulty sleeping (+orthopnea) and slight weight gain along with this.   Recently started an antibiotic but says that she hasn't noticed any difference since she started taking it. Saw the liver provider at [facility] yesterday and had lab work done.   Past Medical History:  Diagnosis Date  . Anemia   . Arthritis   . Asthma   . Bradycardia   . CAD (coronary artery disease)    BIL.CAROTID ARTERY STENOSIS  . CHF (congestive heart failure) ([place])   . Chronic low back pain   . Cirrhosis of liver not due to alcohol ([place])   . Colon polyps   . Diabetes mellitus without complication ([place])   . Dyspnea   . Esophageal varices without bleeding ([place])   . GERD (gastroesophageal reflux disease)   . Hip pain   . Hyperlipidemia   . Hypertension   . IDA (iron deficiency anemia)   . 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 Sleep apnea   . SOB (shortness of breath)   . Stroke ([place])   . Temporal arteritis ([place])   . Thrombocytopenia  ([place])   . Thrombocytopenia ([place])    Past Surgical History:  Procedure Laterality Date  . COLONOSCOPY  10/24/2013, 2002   hyperplastic polpys  . CORONARY ANGIOPLASTY    . CORONARY ARTERY BYPASS GRAFT  09/1998  . ESOPHAGOGASTRODUODENOSCOPY (EGD) WITH PROPOFOL N/A 08/26/2016   Procedure: ESOPHAGOGASTRODUODENOSCOPY (EGD) WITH PROPOFOL;  Surgeon: Manya Silvas, MD;  Location: [facility] ENDOSCOPY;  Service: Endoscopy;  Laterality: N/A;  . ESOPHAGOGASTRODUODENOSCOPY (EGD) WITH PROPOFOL N/A 02/05/2018   Procedure: ESOPHAGOGASTRODUODENOSCOPY (EGD) WITH PROPOFOL;  Surgeon: Manya Silvas, MD;  Location: [facility] ENDOSCOPY;  Service: Endoscopy;  Laterality: N/A;  . ESOPHAGOGASTRODUODENOSCOPY (EGD) WITH PROPOFOL N/A 07/22/2019   Procedure: ESOPHAGOGASTRODUODENOSCOPY (EGD) WITH PROPOFOL;  Surgeon: Toledo, Benay Pike, MD;  Location: [facility] ENDOSCOPY;  Service: Gastroenterology;  Laterality: N/A;  . ESOPHAGOGASTRODUODENOSCOPY ENDOSCOPY  05/24/2011  . hemorroid surgery    . JOINT REPLACEMENT  2003  2005   [email or web address]   Family History  Problem Relation Age of Onset  . Heart disease Other   . Hypertension Other   . Anemia Other   . Colon cancer Other   . CVA Mother   . Dementia Mother   . CAD Father   . Breast cancer Neg Hx    Social History   Tobacco Use  . Smoking status: Never Smoker  . Smokeless tobacco: Never Used  Substance Use Topics  . Alcohol use: No    Alcohol/week: 0.0  standard drinks   Allergies  Allergen Reactions  . Morphine And Related Nausea And Vomiting    Other reaction(s): Nausea And Vomiting  . Adhesive [Tape] Rash  . Other Rash    NEOPRENE, TAPE, BAND-AID TOUGH STRIPS    Prior to Admission medications   Medication Sig Start Date End Date Taking? Authorizing Provider  alendronate (FOSAMAX) 70 MG tablet Take 70 mg by mouth every Monday. Take with a full glass of water on an empty stomach.   Yes [provider]  aspirin EC 81 MG tablet Take 81 mg by mouth daily.    Yes [provider]  benzonatate (TESSALON PERLES) 100 MG capsule Take 1 capsule (100 mg total) by mouth every 6 (six) hours as needed for cough. 09/27/19 09/26/20 Yes Sharen Hones, MD  blood glucose meter kit and supplies KIT Dispense based on patient and insurance preference. Use up to four times daily as directed. (FOR ICD-9 250.00, 250.01). 04/24/16  Yes Gladstone Lighter, MD  Calcium Carbonate-Vitamin D (CALCIUM-VITAMIN D) 500-200 MG-UNIT tablet Take 1 tablet by mouth 2 (two) times daily.    Yes [provider]  Cranberry-Milk Thistle 250-75 MG CAPS Take 1 capsule by mouth 3 (three) times daily.    Yes [provider]  ferrous sulfate 325 (65 FE) MG tablet Take 325 mg by mouth daily.    Yes [provider]  gabapentin (NEURONTIN) 100 MG capsule Take 200 mg by mouth at bedtime.  01/26/15  Yes [provider]  levothyroxine (SYNTHROID, LEVOTHROID) 50 MCG tablet Take 50 mcg by mouth daily before breakfast.  03/31/15  Yes [provider]  meclizine (ANTIVERT) 12.5 MG tablet Take 12.5 mg by mouth 3 (three) times daily as needed for dizziness.   Yes [provider]  Melatonin 10 MG CAPS Take 10 mg by mouth at bedtime.    Yes [provider]  metFORMIN (GLUCOPHAGE) 500 MG tablet Take 1,000 mg by mouth daily. In evening   Yes [provider]  metoprolol tartrate (LOPRESSOR) 25 MG tablet Take 25 mg by mouth in the morning and at bedtime. 07/22/19  Yes [provider]  omeprazole (PRILOSEC) 20 MG capsule Take 20 mg by mouth daily.  03/24/15  Yes [provider]  potassium chloride SA (K-DUR,KLOR-CON) 20 MEQ tablet Take 1 tablet (20 mEq total) by mouth 2 (two) times daily. 11/22/16  Yes Cammie Sickle, MD  rifaximin (XIFAXAN) 550 MG TABS tablet Take 550 mg by mouth 2 (two) times daily.  01/20/15  Yes [provider]  sacubitril-valsartan (ENTRESTO) 24-26 MG Take 0.5 tablets by mouth 2 (two) times  daily. 09/27/19  Yes Sharen Hones, MD  simvastatin (ZOCOR) 20 MG tablet Take 20 mg by mouth at bedtime.  03/24/15  Yes [provider]  torsemide (DEMADEX) 20 MG tablet Take 20 mg by mouth 2 (two) times daily.   Yes [provider]    Review of Systems  Constitutional: Positive for fatigue. Negative for appetite change.  HENT: Positive for congestion and rhinorrhea. Negative for sore throat.   Eyes: Negative.   Respiratory: Positive for cough (productive cough), chest tightness (heaviness on right upper chest at night) and shortness of breath (easily).   Cardiovascular: Positive for leg swelling. Negative for chest pain and palpitations.  Gastrointestinal: Negative for abdominal distention and abdominal pain.  Endocrine: Negative.   Genitourinary: Negative.   Musculoskeletal: Positive for arthralgias (shoulder pain). Negative for neck pain.  Skin: Negative.   Allergic/Immunologic: Negative.  Neurological: Positive for light-headedness. Negative for dizziness.  Hematological: Negative for adenopathy. Bruises/bleeds easily.  Psychiatric/Behavioral: Positive for sleep disturbance (sleeping on wedge and pillows). Negative for dysphoric mood. The patient is not nervous/anxious.    Vitals:   10/31/19 1101  BP: 136/68  Pulse: 65  Resp: 18  SpO2: 100%  Weight: 160 lb (72.6 kg)  Height: 4' 11"  (1.499 m)   Wt Readings from Last 3 Encounters:  10/31/19 160 lb (72.6 kg)  10/04/19 162 lb 6 oz (73.7 kg)  09/27/19 160 lb 6.4 oz (72.8 kg)   Lab Results  Component Value Date   CREATININE 0.83 09/27/2019   CREATININE 1.02 (H) 09/26/2019   CREATININE 1.19 (H) 09/25/2019    Physical Exam Vitals and nursing note reviewed.  Constitutional:      Appearance: Normal appearance.  HENT:     Head: Normocephalic and atraumatic.  Cardiovascular:     Rate and Rhythm: Normal rate and regular rhythm.  Pulmonary:     Effort: Pulmonary effort is normal.     Breath sounds:  Examination of the right-upper field reveals rales. Examination of the left-upper field reveals rhonchi and rales. Rhonchi and rales present. No wheezing.  Abdominal:     General: There is distension.     Tenderness: There is no abdominal tenderness.  Musculoskeletal:        General: No tenderness.     Cervical back: Normal range of motion and neck supple.     Right lower leg: Edema (1+ pitting) present.     Left lower leg: Edema (1+ pitting) present.  Skin:    General: Skin is warm and dry.  Neurological:     General: No focal deficit present.     Mental Status: She is alert and oriented to person, place, and time.  Psychiatric:        Mood and Affect: Mood normal.        Behavior: Behavior normal.        Thought Content: Thought content normal.     Assessment & Plan:  1: Acute on Chronic heart failure with preserved ejection fraction along with structural changes (LAE)- - NYHA class III - moderately fluid overloaeded today with edema, slight weight gain and worsening shortness of breath with rales present - will send for 60m IV lasix/ 418m PO potassium today; labs just done yesterday so will not repeat them today - weighing daily and she says that her weight has gone up a couple of pounds; reminded to call for an overnight weight gain of >2 pounds or a weekly weight gain of >5 pounds - weight down 2 pounds from last visit here 1 month ago - not adding salt and trying to follow a low sodium diet - saw cardiology (KNehemiah Massed7/28/21 - BNP 09/25/19 was 221.8 - has received both COVID vaccines  2: HTN- - BP looks good today - saw PCP (Hande) 10/04/19 - BMP 10/30/19 reviewed and showed sodium 136, potassium 4.0, creatinine 1.1 and GFR 47  3: DM- - A1c 07/31/19 was  5.4% - glucose at home today was 119  4: Lymphedema- - stage 2 - unable to exercise due to symptoms - does elevate her legs but doesn't think her edema improves with that - still hasn't been wearing compression socks  and she was encouraged to put them on daily with removal at bedtime - consider lymphapress compression boots if edema persists  5: Cirrhosis- - [facility]   Medication list was  reviewed.   Return tomorrow for a recheck of symptoms for possible additional IV lasix. If improvement in symptoms, will check BMP tomorrow

## 2019-10-31 NOTE — Patient Instructions (Signed)
Continue weighing daily and call for an overnight weight gain of > 2 pounds or a weekly weight gain of >5 pounds. 

## 2019-11-01 ENCOUNTER — Other Ambulatory Visit: Payer: Self-pay

## 2019-11-01 ENCOUNTER — Emergency Department: Payer: Medicare Other

## 2019-11-01 ENCOUNTER — Encounter: Payer: Self-pay | Admitting: Emergency Medicine

## 2019-11-01 ENCOUNTER — Encounter: Payer: Self-pay | Admitting: Family

## 2019-11-01 ENCOUNTER — Observation Stay
Admission: EM | Admit: 2019-11-01 | Discharge: 2019-11-04 | Disposition: A | Discharge status: Home / Self Care | Payer: Medicare Other | Attending: Internal Medicine | Admitting: Internal Medicine

## 2019-11-01 ENCOUNTER — Ambulatory Visit: Payer: Medicare Other | Admitting: Family

## 2019-11-01 VITALS — BP 128/67 | HR 68 | Resp 16 | Ht 60.0 in | Wt 159.2 lb

## 2019-11-01 DIAGNOSIS — Z7982 Long term (current) use of aspirin: Secondary | ICD-10-CM | POA: Insufficient documentation

## 2019-11-01 DIAGNOSIS — J45909 Unspecified asthma, uncomplicated: Secondary | ICD-10-CM | POA: Insufficient documentation

## 2019-11-01 DIAGNOSIS — Z888 Allergy status to other drugs, medicaments and biological substances status: Secondary | ICD-10-CM | POA: Insufficient documentation

## 2019-11-01 DIAGNOSIS — M1990 Unspecified osteoarthritis, unspecified site: Secondary | ICD-10-CM | POA: Insufficient documentation

## 2019-11-01 DIAGNOSIS — R05 Cough: Secondary | ICD-10-CM | POA: Insufficient documentation

## 2019-11-01 DIAGNOSIS — I5033 Acute on chronic diastolic (congestive) heart failure: Secondary | ICD-10-CM

## 2019-11-01 DIAGNOSIS — Z20822 Contact with and (suspected) exposure to covid-19: Secondary | ICD-10-CM | POA: Insufficient documentation

## 2019-11-01 DIAGNOSIS — I480 Paroxysmal atrial fibrillation: Secondary | ICD-10-CM | POA: Diagnosis present

## 2019-11-01 DIAGNOSIS — E1140 Type 2 diabetes mellitus with diabetic neuropathy, unspecified: Secondary | ICD-10-CM | POA: Diagnosis not present

## 2019-11-01 DIAGNOSIS — E119 Type 2 diabetes mellitus without complications: Secondary | ICD-10-CM | POA: Insufficient documentation

## 2019-11-01 DIAGNOSIS — R0602 Shortness of breath: Secondary | ICD-10-CM | POA: Diagnosis not present

## 2019-11-01 DIAGNOSIS — Z79899 Other long term (current) drug therapy: Secondary | ICD-10-CM | POA: Insufficient documentation

## 2019-11-01 DIAGNOSIS — M316 Other giant cell arteritis: Secondary | ICD-10-CM | POA: Insufficient documentation

## 2019-11-01 DIAGNOSIS — I110 Hypertensive heart disease with heart failure: Secondary | ICD-10-CM | POA: Insufficient documentation

## 2019-11-01 DIAGNOSIS — Z955 Presence of coronary angioplasty implant and graft: Secondary | ICD-10-CM | POA: Insufficient documentation

## 2019-11-01 DIAGNOSIS — R0603 Acute respiratory distress: Secondary | ICD-10-CM

## 2019-11-01 DIAGNOSIS — I509 Heart failure, unspecified: Secondary | ICD-10-CM | POA: Diagnosis not present

## 2019-11-01 DIAGNOSIS — Z79890 Hormone replacement therapy (postmenopausal): Secondary | ICD-10-CM | POA: Insufficient documentation

## 2019-11-01 DIAGNOSIS — Z96653 Presence of artificial knee joint, bilateral: Secondary | ICD-10-CM | POA: Diagnosis not present

## 2019-11-01 DIAGNOSIS — I2510 Atherosclerotic heart disease of native coronary artery without angina pectoris: Secondary | ICD-10-CM | POA: Insufficient documentation

## 2019-11-01 DIAGNOSIS — I890 Lymphedema, not elsewhere classified: Secondary | ICD-10-CM

## 2019-11-01 DIAGNOSIS — Z8719 Personal history of other diseases of the digestive system: Secondary | ICD-10-CM | POA: Insufficient documentation

## 2019-11-01 DIAGNOSIS — K219 Gastro-esophageal reflux disease without esophagitis: Secondary | ICD-10-CM | POA: Insufficient documentation

## 2019-11-01 DIAGNOSIS — I10 Essential (primary) hypertension: Secondary | ICD-10-CM | POA: Diagnosis present

## 2019-11-01 DIAGNOSIS — D696 Thrombocytopenia, unspecified: Secondary | ICD-10-CM | POA: Insufficient documentation

## 2019-11-01 DIAGNOSIS — J209 Acute bronchitis, unspecified: Secondary | ICD-10-CM

## 2019-11-01 DIAGNOSIS — G4730 Sleep apnea, unspecified: Secondary | ICD-10-CM | POA: Insufficient documentation

## 2019-11-01 DIAGNOSIS — E1142 Type 2 diabetes mellitus with diabetic polyneuropathy: Secondary | ICD-10-CM | POA: Diagnosis present

## 2019-11-01 DIAGNOSIS — Z8673 Personal history of transient ischemic attack (TIA), and cerebral infarction without residual deficits: Secondary | ICD-10-CM | POA: Insufficient documentation

## 2019-11-01 DIAGNOSIS — Z8249 Family history of ischemic heart disease and other diseases of the circulatory system: Secondary | ICD-10-CM | POA: Insufficient documentation

## 2019-11-01 DIAGNOSIS — Z951 Presence of aortocoronary bypass graft: Secondary | ICD-10-CM | POA: Insufficient documentation

## 2019-11-01 DIAGNOSIS — Z7984 Long term (current) use of oral hypoglycemic drugs: Secondary | ICD-10-CM | POA: Insufficient documentation

## 2019-11-01 DIAGNOSIS — R0789 Other chest pain: Secondary | ICD-10-CM | POA: Insufficient documentation

## 2019-11-01 DIAGNOSIS — K7469 Other cirrhosis of liver: Secondary | ICD-10-CM

## 2019-11-01 DIAGNOSIS — Z885 Allergy status to narcotic agent status: Secondary | ICD-10-CM | POA: Insufficient documentation

## 2019-11-01 DIAGNOSIS — K7460 Unspecified cirrhosis of liver: Secondary | ICD-10-CM | POA: Insufficient documentation

## 2019-11-01 DIAGNOSIS — E785 Hyperlipidemia, unspecified: Secondary | ICD-10-CM | POA: Insufficient documentation

## 2019-11-01 DIAGNOSIS — J90 Pleural effusion, not elsewhere classified: Secondary | ICD-10-CM

## 2019-11-01 LAB — BASIC METABOLIC PANEL
Anion gap: 10 (ref 5–15)
BUN: 24 mg/dL — ABNORMAL HIGH (ref 8–23)
CO2: 25 mmol/L (ref 22–32)
Calcium: 8.7 mg/dL — ABNORMAL LOW (ref 8.9–10.3)
Chloride: 101 mmol/L (ref 98–111)
Creatinine, Ser: 1.04 mg/dL — ABNORMAL HIGH (ref 0.44–1.00)
GFR calc Af Amer: 58 mL/min — ABNORMAL LOW (ref >60)
GFR calc non Af Amer: 50 mL/min — ABNORMAL LOW (ref >60)
Glucose, Bld: 156 mg/dL — ABNORMAL HIGH (ref 70–99)
Potassium: 4.4 mmol/L (ref 3.5–5.1)
Sodium: 136 mmol/L (ref 135–145)

## 2019-11-01 LAB — CBC
HCT: 32.9 % — ABNORMAL LOW (ref 36.0–46.0)
Hemoglobin: 11.5 g/dL — ABNORMAL LOW (ref 12.0–15.0)
MCH: 31 pg (ref 26.0–34.0)
MCHC: 35 g/dL (ref 30.0–36.0)
MCV: 88.7 fL (ref 80.0–100.0)
Platelets: 59 10*3/uL — ABNORMAL LOW (ref 150–400)
RBC: 3.71 MIL/uL — ABNORMAL LOW (ref 3.87–5.11)
RDW: 17 % — ABNORMAL HIGH (ref 11.5–15.5)
WBC: 4.2 10*3/uL (ref 4.0–10.5)
nRBC: 0 % (ref 0.0–0.2)

## 2019-11-01 LAB — BRAIN NATRIURETIC PEPTIDE: B Natriuretic Peptide: 127.5 pg/mL — ABNORMAL HIGH (ref 0.0–100.0)

## 2019-11-01 MED ORDER — IPRATROPIUM-ALBUTEROL 0.5-2.5 (3) MG/3ML IN SOLN
3.0000 mL | Freq: Once | RESPIRATORY_TRACT | Status: AC
  Administered 2019-11-01: 3 mL via RESPIRATORY_TRACT
  Filled 2019-11-01: qty 3

## 2019-11-01 MED ORDER — ACETYLCYSTEINE 20 % IN SOLN
4.0000 mL | Freq: Once | RESPIRATORY_TRACT | Status: AC
  Administered 2019-11-02: 4 mL via RESPIRATORY_TRACT
  Filled 2019-11-01: qty 4

## 2019-11-01 NOTE — ED Provider Notes (Signed)
[facility] Emergency Department Provider Note    ____________________________________________   I have reviewed the triage vital signs and the nursing notes.   HISTORY  Chief Complaint Shortness of breath  History limited by: Not Limited   HPI Theresa [name] is a 79 y.o. female who presents to the emergency department today because of concerns for shortness of breath.  Patient states that she has been getting more short of breath over the past few days.  She does have a history of heart failure and went to heart failure clinic yesterday.  She was given IV Lasix.  Today she has not had any improvement.  When she went back to her care clinic they are concerned about continued shortness of breath and had her come to the emergency department.  Patient denies any chest pain with this.  She has had a cough.  Denies any fevers.  Has received her Covid vaccines and has not been around anybody sick that she is aware of.  The patient does have lower extremity edema.   Records reviewed. Per medical record review patient has a history of CHF, CAD, HLD, HTN.  Past Medical History:  Diagnosis Date  . Anemia   . Arthritis   . Asthma   . Bradycardia   . CAD (coronary artery disease)    BIL.CAROTID ARTERY STENOSIS  . CHF (congestive heart failure) ([place])   . Chronic low back pain   . 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 Colon polyps   . Diabetes mellitus without complication ([place])   . Dyspnea   . Esophageal varices without bleeding ([place])   . GERD (gastroesophageal reflux disease)   . Hip pain   . Hyperlipidemia   . Hypertension   . IDA (iron deficiency anemia)   . 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 Sleep apnea   . SOB (shortness of breath)   . Stroke ([place])   . Temporal arteritis ([place])   . Thrombocytopenia ([place])   . Thrombocytopenia [facility])     Patient Active Problem List   Diagnosis Date Noted  . Type 2 diabetes mellitus  with peripheral neuropathy ([place]) 09/26/2019  . Acute CHF ([place]) 09/25/2019  . Acute lower UTI   . Syncope and collapse   . GI bleeding 07/31/2019  . Orthopnea 11/21/2016  . CVA (cerebral vascular accident) ([place]) 04/22/2016  . MGUS (monoclonal gammopathy of unknown significance) 10/14/2015  . Cirrhosis ([place]) 10/14/2015  . 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 Thrombocytopenia ([place]) 10/03/2014  . Iron deficiency anemia due to chronic blood loss 10/03/2014    Past Surgical History:  Procedure Laterality Date  . COLONOSCOPY  10/24/2013, 2002   hyperplastic polpys  . CORONARY ANGIOPLASTY    . CORONARY ARTERY BYPASS GRAFT  09/1998  . ESOPHAGOGASTRODUODENOSCOPY (EGD) WITH PROPOFOL N/A 08/26/2016   Procedure: ESOPHAGOGASTRODUODENOSCOPY (EGD) WITH PROPOFOL;  Surgeon: Manya Silvas, MD;  Location: [facility] ENDOSCOPY;  Service: Endoscopy;  Laterality: N/A;  . ESOPHAGOGASTRODUODENOSCOPY (EGD) WITH PROPOFOL N/A 02/05/2018   Procedure: ESOPHAGOGASTRODUODENOSCOPY (EGD) WITH PROPOFOL;  Surgeon: Manya Silvas, MD;  Location[facility] ENDOSCOPY;  Service: Endoscopy;  Laterality: N/A;  . ESOPHAGOGASTRODUODENOSCOPY (EGD) WITH PROPOFOL N/A 07/22/2019   Procedure: ESOPHAGOGASTRODUODENOSCOPY (EGD) WITH PROPOFOL;  Surgeon: Toledo, Benay Pike, MD;  Location: [facility] ENDOSCOPY;  Service: Gastroenterology;  Laterality: N/A;  . ESOPHAGOGASTRODUODENOSCOPY ENDOSCOPY  05/24/2011  . hemorroid surgery    . JOINT REPLACEMENT  2003  2005   bil.knees  Prior to Admission medications   Medication Sig Start Date End Date Taking? Authorizing Provider  alendronate (FOSAMAX) 70 MG tablet Take 70 mg by mouth every Monday. Take with a full glass of water on an empty stomach.    [provider]  aspirin EC 81 MG tablet Take 81 mg by mouth daily.    [provider]  benzonatate (TESSALON PERLES) 100 MG capsule Take 1 capsule (100 mg total) by mouth every 6 (six) hours as needed for cough. 09/27/19 09/26/20  Sharen Hones, MD  blood glucose meter kit and supplies KIT Dispense based on patient and insurance preference. Use up to four times daily as directed. (FOR ICD-9 250.00, 250.01). 04/24/16   Gladstone Lighter, MD  Calcium Carbonate-Vitamin D (CALCIUM-VITAMIN D) 500-200 MG-UNIT tablet Take 1 tablet by mouth 2 (two) times daily.     [provider]  Cranberry-Milk Thistle 250-75 MG CAPS Take 1 capsule by mouth 3 (three) times daily.     [provider]  ferrous sulfate 325 (65 FE) MG tablet Take 325 mg by mouth daily.     [provider]  gabapentin (NEURONTIN) 100 MG capsule Take 200 mg by mouth at bedtime.  01/26/15   [provider]  levothyroxine (SYNTHROID, LEVOTHROID) 50 MCG tablet Take 50 mcg by mouth daily before breakfast.  03/31/15   [provider]  meclizine (ANTIVERT) 12.5 MG tablet Take 12.5 mg by mouth 3 (three) times daily as needed for dizziness.    [provider]  Melatonin 10 MG CAPS Take 10 mg by mouth at bedtime.     [provider]  metFORMIN (GLUCOPHAGE) 500 MG tablet Take 1,000 mg by mouth daily. In evening    [provider]  metoprolol tartrate (LOPRESSOR) 25 MG tablet Take 25 mg by mouth in the morning and at bedtime. 07/22/19   [provider]  omeprazole (PRILOSEC) 20 MG capsule Take 20 mg by mouth daily.  03/24/15   [provider]  potassium chloride SA (K-DUR,KLOR-CON) 20 MEQ tablet Take 1 tablet (20 mEq total) by mouth 2 (two) times daily. 11/22/16   Cammie Sickle, MD  rifaximin (XIFAXAN) 550 MG TABS tablet Take 550 mg by mouth 2 (two) times daily.  01/20/15   [provider]  sacubitril-valsartan (ENTRESTO) 24-26 MG Take 0.5 tablets by mouth 2 (two) times daily. 09/27/19   Sharen Hones, MD  simvastatin (ZOCOR) 20 MG tablet Take 20 mg by mouth at bedtime.  03/24/15   [provider]  torsemide (DEMADEX) 20 MG tablet Take 20 mg by mouth 2 (two) times daily.    [provider]    Allergies Morphine and related, Adhesive [tape], and Other  Family History  Problem Relation Age of Onset  . Heart disease Other   . Hypertension Other   . Anemia Other   . Colon cancer Other   . CVA Mother   . Dementia Mother   . CAD Father   . Breast cancer Neg Hx     Social History Social History   Tobacco Use  . Smoking status: Never Smoker  . Smokeless tobacco: Never Used  Vaping Use  . Vaping Use: Never used  Substance Use Topics  . Alcohol use: No    Alcohol/week: 0.0 standard drinks  . Drug use: No    Review of Systems Constitutional: No fever/chills Eyes: No visual changes. ENT: No sore throat. Cardiovascular: Denies chest pain. Respiratory: Positive for shortness of breath. Gastrointestinal:  No abdominal pain.  No nausea, no vomiting.  No diarrhea.   Genitourinary: Negative for dysuria. Musculoskeletal: Negative for back pain. Skin: Negative for rash. Neurological: Negative for headaches, focal weakness or numbness.  ____________________________________________   PHYSICAL EXAM:  VITAL SIGNS: ED Triage Vitals [11/01/19 1243]  Enc Vitals Group     BP 114/68     Pulse Rate 65     Resp 18     Temp 98.5 F (36.9 C)     Temp Source Oral     SpO2 97 %     Weight 159 lb 6.4 oz (72.3 kg)     Height      Head Circumference      Peak Flow      Pain Score 0   Constitutional: Alert and oriented.  Eyes: Conjunctivae are normal.  ENT      Head: Normocephalic and atraumatic.      Nose: No congestion/rhinnorhea.      Mouth/Throat: Mucous membranes are moist.      Neck: No stridor. Hematological/Lymphatic/Immunilogical: No cervical lymphadenopathy. Cardiovascular: Normal rate, regular rhythm.  No murmurs, rubs, or gallops.  Respiratory: Slightly increased respiratory effort. Occasional dry cough. Poor air movement. No crackles. Decreased breath sounds in right lower lobe.  Gastrointestinal: Soft and non tender. No rebound. No  guarding.  Genitourinary: Deferred Musculoskeletal: Normal range of motion in all extremities. No lower extremity edema. Neurologic:  Normal speech and language. No gross focal neurologic deficits are appreciated.  Skin:  Skin is warm, dry and intact. No rash noted. Psychiatric: Mood and affect are normal. Speech and behavior are normal. Patient exhibits appropriate insight and judgment.  ____________________________________________    LABS (pertinent positives/negatives)  BNP 127.5 BMP na 136, k 4.4, glu 156, cr 1.04 CBC wbc 4.2, hgb 11.5, plt 59  ____________________________________________   EKG  I, Nance Pear, attending physician, personally viewed and interpreted this EKG  EKG Time: 1248 Rate: 67 Rhythm: sinus rhythm Axis: normal Intervals: qtc 454 QRS: narrow, q waves III ST changes: no st elevation Impression: abnormal ekg   ____________________________________________    RADIOLOGY  CXR No focal infiltrate  ____________________________________________   PROCEDURES  Procedures  ____________________________________________   INITIAL IMPRESSION / ASSESSMENT AND PLAN / ED COURSE  Pertinent labs & imaging results that were available during my care of the patient were reviewed by me and considered in my medical decision making (see chart for details).   Patient presented to the emergency department today because of concerns for shortness of breath.  Patient did have concern that she may be fluid overloaded given history of CHF.  On exam however no significant crackles.  Chest x-ray without any obvious pulmonary edema and shows stable pleural effusion on the right side.  BNP was elevated however not as elevated as previou ED visit.  Given that the patient did not seem to respond to IV Lasix given yesterday do wonder if CHF is not the cause of the shortness of breath today.  Patient was given DuoNeb treatments and the thought that it could be bronchitis type  picture. She did appear to gain some relief after duoneb. Still felt like she needed to bring up phlegm. Will try mucomyst.   ____________________________________________   FINAL CLINICAL IMPRESSION(S) / ED DIAGNOSES  Final diagnoses:  Shortness of breath     Note: This dictation was prepared with Dragon dictation. Any transcriptional errors that result from this process are unintentional     Nance Pear, MD 11/01/19 2318

## 2019-11-01 NOTE — Patient Instructions (Signed)
Continue weighing daily and call for an overnight weight gain of > 2 pounds or a weekly weight gain of >5 pounds. 

## 2019-11-01 NOTE — ED Triage Notes (Signed)
Productive white cough per pt. Is CHF pt.  Was seen yesterday at heart failure clinic and given IV lasix.  Weighed one lb less today per pt but when went back in they thought she had fluid on her lungs so sent to ED.  No chest pain, main sx is cough.  Legs are swollen more than baseline per pt.

## 2019-11-01 NOTE — Progress Notes (Signed)
Patient ID: Theresa [name], female    DOB: 1941-01-01, 79 y.o.   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   Echo report from 09/26/19 reviewed and showed an EF of >75% along with moderately elevated PA pressure, mild LAE and mild/moderate MR/TR.   Admitted 09/25/19 due to acute on chronic HF. Cardiology consult obtained. Initially given IV lasix with transition to oral diuretics. Unable to anticoagulate (for AF) due to chronic thrombocytopenia. Discharged after 2 days.   She presents today for a follow-up visit with a chief complaint of moderate shortness of breath with little exertion. She has associated productive cough, chest tightness, pedal edema, fatigue, light-headedness and PND/ orthopnea along with this. She denies any palpitations or weight gain.   Received 29m IV lasix/ 433m PO potassium yesterday but doesn't feel any better. In fact, she feels like her cough is even worse and she's becoming more short of breath after coughing.   Past Medical History:  Diagnosis Date  . Anemia   . Arthritis   . Asthma   . Bradycardia   . CAD (coronary artery disease)    BIL.CAROTID ARTERY STENOSIS  . CHF (congestive heart failure) (H[place]  . Chronic low back pain   . Cirrhosis of liver not due to alcohol ([place]CM[place]  . Colon polyps   . Diabetes mellitus without complication ([place]  . Dyspnea   . Esophageal varices without bleeding (HCOd[place]  . GERD (gastroesophageal reflux disease)   . Hip pain   . Hyperlipidemia   . Hypertension   . IDA (iron deficiency anemia)   . 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 Sleep apnea   . SOB (shortness of breath)   . Stroke (HCG[place]  . Temporal arteritis (HCCoy[place]  . Thrombocytopenia ([place]CD[place]  . Thrombocytopenia (HCSoph[place]   Past Surgical History:   Procedure Laterality Date  . COLONOSCOPY  10/24/2013, 2002   hyperplastic polpys  . CORONARY ANGIOPLASTY    . CORONARY ARTERY BYPASS GRAFT  09/1998  . ESOPHAGOGASTRODUODENOSCOPY (EGD) WITH PROPOFOL N/A 08/26/2016   Procedure: ESOPHAGOGASTRODUODENOSCOPY (EGD) WITH PROPOFOL;  Surgeon: ElManya SilvasMD;  [facility];  Service: Endoscopy;  Laterality: N/A;  . ESOPHAGOGASTRODUODENOSCOPY (EGD) WITH PROPOFOL N/A 02/05/2018   Procedure: ESOPHAGOGASTRODUODENOSCOPY (EGD) WITH PROPOFOL;  Surgeon: ElManya SilvasMD;  [facility];  Service: Endoscopy;  Laterality: N/A;  . ESOPHAGOGASTRODUODENOSCOPY (EGD) WITH PROPOFOL N/A 07/22/2019   Procedure: ESOPHAGOGASTRODUODENOSCOPY (EGD) WITH PROPOFOL;  Surgeon: Toledo, TeBenay PikeMD;  Location: [facility] ENDOSCOPY;  Service: Gastroenterology;  Laterality: N/A;  . ESOPHAGOGASTRODUODENOSCOPY ENDOSCOPY  05/24/2011  . hemorroid surgery    . JOINT REPLACEMENT  2003  2005   bil.knees   Family History  Problem Relation Age of Onset  . Heart disease Other   . Hypertension Other   . Anemia Other   . Colon cancer Other   . CVA Mother   . Dementia Mother   . CAD Father   . Breast cancer Neg Hx    Social History   Tobacco Use  . Smoking status: Never Smoker  . Smokeless tobacco: Never Used  Substance Use Topics  . Alcohol use: No    Alcohol/week: 0.0 standard drinks   Allergies  Allergen Reactions  . Morphine And Related Nausea  And Vomiting    Other reaction(s): Nausea And Vomiting  . Adhesive [Tape] Rash  . Other Rash    NEOPRENE, TAPE, BAND-AID TOUGH STRIPS   Prior to Admission medications   Medication Sig Start Date End Date Taking? Authorizing Provider  alendronate (FOSAMAX) 70 MG tablet Take 70 mg by mouth every Monday. Take with a full glass of water on an empty stomach.   Yes [provider]  aspirin EC 81 MG tablet Take 81 mg by mouth daily.   Yes [provider]  benzonatate (TESSALON PERLES) 100  MG capsule Take 1 capsule (100 mg total) by mouth every 6 (six) hours as needed for cough. 09/27/19 09/26/20 Yes Sharen Hones, MD  blood glucose meter kit and supplies KIT Dispense based on patient and insurance preference. Use up to four times daily as directed. (FOR ICD-9 250.00, 250.01). 04/24/16  Yes Gladstone Lighter, MD  Calcium Carbonate-Vitamin D (CALCIUM-VITAMIN D) 500-200 MG-UNIT tablet Take 1 tablet by mouth 2 (two) times daily.    Yes [provider]  Cranberry-Milk Thistle 250-75 MG CAPS Take 1 capsule by mouth 3 (three) times daily.    Yes [provider]  ferrous sulfate 325 (65 FE) MG tablet Take 325 mg by mouth daily.    Yes [provider]  gabapentin (NEURONTIN) 100 MG capsule Take 200 mg by mouth at bedtime.  01/26/15  Yes [provider]  levothyroxine (SYNTHROID, LEVOTHROID) 50 MCG tablet Take 50 mcg by mouth daily before breakfast.  03/31/15  Yes [provider]  meclizine (ANTIVERT) 12.5 MG tablet Take 12.5 mg by mouth 3 (three) times daily as needed for dizziness.   Yes [provider]  Melatonin 10 MG CAPS Take 10 mg by mouth at bedtime.    Yes [provider]  metFORMIN (GLUCOPHAGE) 500 MG tablet Take 1,000 mg by mouth daily. In evening   Yes [provider]  metoprolol tartrate (LOPRESSOR) 25 MG tablet Take 25 mg by mouth in the morning and at bedtime. 07/22/19  Yes [provider]  omeprazole (PRILOSEC) 20 MG capsule Take 20 mg by mouth daily.  03/24/15  Yes [provider]  potassium chloride SA (K-DUR,KLOR-CON) 20 MEQ tablet Take 1 tablet (20 mEq total) by mouth 2 (two) times daily. 11/22/16  Yes Cammie Sickle, MD  rifaximin (XIFAXAN) 550 MG TABS tablet Take 550 mg by mouth 2 (two) times daily.  01/20/15  Yes [provider]  sacubitril-valsartan (ENTRESTO) 24-26 MG Take 0.5 tablets by mouth 2 (two) times daily. 09/27/19  Yes Sharen Hones, MD  simvastatin (ZOCOR) 20 MG tablet  Take 20 mg by mouth at bedtime.  03/24/15  Yes [provider]  torsemide (DEMADEX) 20 MG tablet Take 20 mg by mouth 2 (two) times daily.   Yes [provider]     Review of Systems  Constitutional: Positive for fatigue. Negative for appetite change.  HENT: Positive for congestion and rhinorrhea. Negative for sore throat.   Eyes: Negative.   Respiratory: Positive for cough (productive cough), chest tightness (heaviness on right upper chest at night) and shortness of breath (easily).   Cardiovascular: Positive for leg swelling. Negative for chest pain and palpitations.  Gastrointestinal: Positive for abdominal distention (stable). Negative for abdominal pain.  Endocrine: Negative.   Genitourinary: Negative.   Musculoskeletal: Positive for arthralgias (shoulder pain). Negative for neck pain.  Skin: Negative.   Allergic/Immunologic: Negative.   Neurological: Positive for light-headedness. Negative for dizziness.  Hematological: Negative for  adenopathy. Bruises/bleeds easily.  Psychiatric/Behavioral: Positive for sleep disturbance (sleeping on wedge and pillows). Negative for dysphoric mood. The patient is not nervous/anxious.    Vitals:   11/01/19 1029  BP: 128/67  Pulse: 68  Resp: 16  SpO2: 99%  Weight: 159 lb 4 oz (72.2 kg)  Height: 5' (1.524 m)   Wt Readings from Last 3 Encounters:  11/01/19 159 lb 4 oz (72.2 kg)  10/31/19 160 lb (72.6 kg)  10/04/19 162 lb 6 oz (73.7 kg)   Lab Results  Component Value Date   CREATININE 0.83 09/27/2019   CREATININE 1.02 (H) 09/26/2019   CREATININE 1.19 (H) 09/25/2019    Physical Exam Vitals and nursing note reviewed.  Constitutional:      Appearance: Normal appearance.  HENT:     Head: Normocephalic and atraumatic.  Cardiovascular:     Rate and Rhythm: Normal rate and regular rhythm.  Pulmonary:     Effort: Pulmonary effort is normal.     Breath sounds: Examination of the right-upper field reveals rhonchi and  rales. Examination of the left-upper field reveals rhonchi. Rhonchi and rales (clears with cough) present. No wheezing.  Abdominal:     General: There is distension.     Tenderness: There is no abdominal tenderness.  Musculoskeletal:        General: No tenderness.     Cervical back: Normal range of motion and neck supple.     Right lower leg: Edema (1+ pitting) present.     Left lower leg: Edema (1+ pitting) present.  Skin:    General: Skin is warm and dry.  Neurological:     General: No focal deficit present.     Mental Status: She is alert and oriented to person, place, and time.  Psychiatric:        Mood and Affect: Mood normal.        Behavior: Behavior normal.        Thought Content: Thought content normal.     Assessment & Plan:  1: Acute on Chronic heart failure with preserved ejection fraction along with structural changes (LAE)- - NYHA class III - continues to be fluid overloaded today with edema, worsening shortness of breath/ cough and rhonchi present - weighing daily; reminded to call for an overnight weight gain of >2 pounds or a weekly weight gain of >5 pounds - weight unchanged from last visit here yesterday - not adding salt and trying to follow a low sodium diet - saw cardiology Nehemiah Massed) 10/23/19 - received 47m IV lasix/ 454m PO potassium yesterday; doesn't feel any better; says her cough is worse - BNP 09/25/19 was 221.8 - has received both COVID vaccines  2: HTN- - BP looks good although patient says it was low at home - saw PCP (Hande) 10/04/19 - BMP 10/30/19 reviewed and showed sodium 136, potassium 4.0, creatinine 1.1 and GFR 47  3: DM- - A1c 07/31/19 was  5.4% - glucose at home today was 108  4: Lymphedema- - stage 2 - unable to exercise due to symptoms - does elevate her legs but doesn't think her edema improves with that - still hasn't been wearing compression socks and she was encouraged to put them on daily with removal at bedtime - consider  lymphapress compression boots if edema persists  5: Cirrhosis- - [facility]   Medication list was reviewed.   Due to patient's continued symptoms with worsening cough where she is experiencing worsening chest tightness and orthopnea, discussed  going to ED. Patient is very hesitant on going due to wait times. PCP called but they are unable to see patient today. After lengthy discussion with patient explaining my concern for her possibly having pneumonia and that with her symptoms not at all better from yesterday, she agrees to go to the ED. Husband called their sons who were in agreement and a volunteer escorted patient with her husband following to the ED.

## 2019-11-02 ENCOUNTER — Encounter: Payer: Self-pay | Admitting: Internal Medicine

## 2019-11-02 DIAGNOSIS — J209 Acute bronchitis, unspecified: Secondary | ICD-10-CM

## 2019-11-02 DIAGNOSIS — J90 Pleural effusion, not elsewhere classified: Secondary | ICD-10-CM

## 2019-11-02 DIAGNOSIS — I480 Paroxysmal atrial fibrillation: Secondary | ICD-10-CM

## 2019-11-02 DIAGNOSIS — I5033 Acute on chronic diastolic (congestive) heart failure: Secondary | ICD-10-CM | POA: Diagnosis not present

## 2019-11-02 LAB — GLUCOSE, CAPILLARY
Glucose-Capillary: 197 mg/dL — ABNORMAL HIGH (ref 70–99)
Glucose-Capillary: 95 mg/dL (ref 70–99)
Glucose-Capillary: 160 mg/dL — ABNORMAL HIGH (ref 70–99)
Glucose-Capillary: 119 mg/dL — ABNORMAL HIGH (ref 70–99)

## 2019-11-02 LAB — SARS CORONAVIRUS 2 BY RT PCR (HOSPITAL ORDER, PERFORMED IN ~~LOC~~ HOSPITAL LAB): SARS Coronavirus 2: NEGATIVE

## 2019-11-02 MED ORDER — INSULIN ASPART 100 UNIT/ML ~~LOC~~ SOLN
0.0000 [IU] | Freq: Three times a day (TID) | SUBCUTANEOUS | Status: DC
  Administered 2019-11-02 (×2): 3 [IU] via SUBCUTANEOUS
  Administered 2019-11-03: 2 [IU] via SUBCUTANEOUS
  Administered 2019-11-04: 3 [IU] via SUBCUTANEOUS
  Filled 2019-11-02 (×4): qty 1

## 2019-11-02 MED ORDER — MELATONIN 5 MG PO TABS
10.0000 mg | ORAL_TABLET | Freq: Every day | ORAL | Status: DC
  Administered 2019-11-02 – 2019-11-04 (×2): 10 mg via ORAL
  Filled 2019-11-02 (×2): qty 2

## 2019-11-02 MED ORDER — SIMVASTATIN 20 MG PO TABS
20.0000 mg | ORAL_TABLET | Freq: Every day | ORAL | Status: DC
  Administered 2019-11-02 – 2019-11-04 (×2): 20 mg via ORAL
  Filled 2019-11-02: qty 2
  Filled 2019-11-02: qty 1

## 2019-11-02 MED ORDER — POTASSIUM CHLORIDE CRYS ER 20 MEQ PO TBCR
20.0000 meq | EXTENDED_RELEASE_TABLET | Freq: Two times a day (BID) | ORAL | Status: DC
  Administered 2019-11-02 – 2019-11-04 (×4): 20 meq via ORAL
  Filled 2019-11-02 (×4): qty 1

## 2019-11-02 MED ORDER — FUROSEMIDE 10 MG/ML IJ SOLN
80.0000 mg | Freq: Once | INTRAMUSCULAR | Status: AC
  Administered 2019-11-02: 80 mg via INTRAVENOUS
  Filled 2019-11-02: qty 8

## 2019-11-02 MED ORDER — ALENDRONATE SODIUM 70 MG PO TABS: 70.0000 mg | ORAL_TABLET | ORAL | Status: DC

## 2019-11-02 MED ORDER — FUROSEMIDE 10 MG/ML IJ SOLN
60.0000 mg | Freq: Two times a day (BID) | INTRAMUSCULAR | Status: DC
  Administered 2019-11-02 (×2): 60 mg via INTRAVENOUS
  Filled 2019-11-02 (×2): qty 8

## 2019-11-02 MED ORDER — RIFAXIMIN 550 MG PO TABS
550.0000 mg | ORAL_TABLET | Freq: Two times a day (BID) | ORAL | Status: DC
  Administered 2019-11-02 – 2019-11-04 (×4): 550 mg via ORAL
  Filled 2019-11-02 (×6): qty 1

## 2019-11-02 MED ORDER — ASPIRIN EC 81 MG PO TBEC
81.0000 mg | DELAYED_RELEASE_TABLET | Freq: Every day | ORAL | Status: DC
  Administered 2019-11-02 – 2019-11-04 (×3): 81 mg via ORAL
  Filled 2019-11-02 (×3): qty 1

## 2019-11-02 MED ORDER — PANTOPRAZOLE SODIUM 40 MG PO TBEC
40.0000 mg | DELAYED_RELEASE_TABLET | Freq: Every day | ORAL | Status: DC
  Administered 2019-11-02 – 2019-11-04 (×3): 40 mg via ORAL
  Filled 2019-11-02 (×3): qty 1

## 2019-11-02 MED ORDER — CEFDINIR 300 MG PO CAPS
300.0000 mg | ORAL_CAPSULE | Freq: Two times a day (BID) | ORAL | Status: DC
  Administered 2019-11-02 – 2019-11-04 (×5): 300 mg via ORAL
  Filled 2019-11-02 (×7): qty 1

## 2019-11-02 MED ORDER — IPRATROPIUM-ALBUTEROL 0.5-2.5 (3) MG/3ML IN SOLN: 3.0000 mL | RESPIRATORY_TRACT | Status: DC | PRN

## 2019-11-02 MED ORDER — LEVOTHYROXINE SODIUM 50 MCG PO TABS
50.0000 ug | ORAL_TABLET | Freq: Every day | ORAL | Status: DC
  Administered 2019-11-03 – 2019-11-04 (×2): 50 ug via ORAL
  Filled 2019-11-02 (×2): qty 1

## 2019-11-02 MED ORDER — SODIUM CHLORIDE 0.9 % IV SOLN: 250.0000 mL | INTRAVENOUS | Status: DC | PRN

## 2019-11-02 MED ORDER — SODIUM CHLORIDE 0.9% FLUSH: 3.0000 mL | INTRAVENOUS | Status: DC | PRN

## 2019-11-02 MED ORDER — INSULIN ASPART 100 UNIT/ML ~~LOC~~ SOLN: 0.0000 [IU] | Freq: Every day | SUBCUTANEOUS | Status: DC

## 2019-11-02 MED ORDER — FERROUS SULFATE 325 (65 FE) MG PO TABS
325.0000 mg | ORAL_TABLET | Freq: Every day | ORAL | Status: DC
  Administered 2019-11-02 – 2019-11-04 (×3): 325 mg via ORAL
  Filled 2019-11-02 (×4): qty 1

## 2019-11-02 MED ORDER — SACUBITRIL-VALSARTAN 24-26 MG PO TABS
0.5000 | ORAL_TABLET | Freq: Two times a day (BID) | ORAL | Status: DC
  Administered 2019-11-02 – 2019-11-03 (×3): 0.5 via ORAL
  Filled 2019-11-02: qty 0.5
  Filled 2019-11-02: qty 1
  Filled 2019-11-02 (×2): qty 0.5

## 2019-11-02 MED ORDER — ACETAMINOPHEN 325 MG PO TABS: 650.0000 mg | ORAL_TABLET | ORAL | Status: DC | PRN

## 2019-11-02 MED ORDER — MECLIZINE HCL 12.5 MG PO TABS
12.5000 mg | ORAL_TABLET | Freq: Three times a day (TID) | ORAL | Status: DC | PRN
  Filled 2019-11-02: qty 1

## 2019-11-02 MED ORDER — CALCIUM CARBONATE-VITAMIN D 500-200 MG-UNIT PO TABS
1.0000 | ORAL_TABLET | Freq: Two times a day (BID) | ORAL | Status: DC
  Administered 2019-11-02 – 2019-11-04 (×5): 1 via ORAL
  Filled 2019-11-02 (×6): qty 1

## 2019-11-02 MED ORDER — METOPROLOL TARTRATE 25 MG PO TABS
25.0000 mg | ORAL_TABLET | Freq: Two times a day (BID) | ORAL | Status: DC
  Administered 2019-11-02 – 2019-11-04 (×5): 25 mg via ORAL
  Filled 2019-11-02 (×5): qty 1

## 2019-11-02 MED ORDER — ONDANSETRON HCL 4 MG/2ML IJ SOLN: 4.0000 mg | Freq: Four times a day (QID) | INTRAMUSCULAR | Status: DC | PRN

## 2019-11-02 MED ORDER — GABAPENTIN 100 MG PO CAPS
200.0000 mg | ORAL_CAPSULE | Freq: Every day | ORAL | Status: DC
  Administered 2019-11-02 – 2019-11-04 (×2): 200 mg via ORAL
  Filled 2019-11-02 (×2): qty 2

## 2019-11-02 MED ORDER — SODIUM CHLORIDE 0.9% FLUSH
3.0000 mL | Freq: Two times a day (BID) | INTRAVENOUS | Status: DC
  Administered 2019-11-04 (×2): 3 mL via INTRAVENOUS

## 2019-11-02 NOTE — ED Notes (Signed)
Pt head of bed raised and lights turned off

## 2019-11-02 NOTE — ED Notes (Signed)
Pt up to use bathroom

## 2019-11-02 NOTE — Progress Notes (Signed)
Her breathing is a little better today.  She feels a little dizzy.  Vital signs are stable.  Continue current treatment.  Plan of care was discussed with the patient and her husband at the bedside.

## 2019-11-02 NOTE — H&P (Signed)
History and Physical    LA SHEHAN IRJ:[id number] DOB: 1940-07-04 DOA: 11/01/2019  PCP: Tracie Harrier, MD   Patient coming from: Home  I have personally briefly reviewed patient's old medical records in [place]  Chief Complaint: Shortness of breath, sent from cardiology office  HPI: Theresa [name] is a 79 y.o. female with medical history significant for diastolic heart failure, cryptogenic cirrhosis, followed at [facility], paroxysmal A. fib not on anticoagulation due to chronic thrombocytopenia, CAD, OSA, HTN, hospitalized a month prior with heart failure exacerbation, who was sent from primary cardiologist office for inpatient treatment of CHF exacerbation/fluid overload not responding to outpatient titration of diuretic therapy.  EF on 09/25/2019 was greater than 75%.  She endorses acough but denies fever or chills.  Denies chest pain.  Patient had a CT done by her doctors at [facility] on 10/30/2019 that showed a large right pleural effusion  ED Course: On arrival patient was found to have some mild wheezing and was treated with duo nebs.  Chest x-ray showed small right pleural effusion, stable no new focal infiltrate.  BNP 129.  WBC normal.  Patient treated with duo nebs x3 as well as Lasix.  Hospitalist consulted for admission  Review of Systems: As per HPI otherwise all other systems on review of systems negative.    Past Medical History:  Diagnosis Date  . Anemia   . Arthritis   . Asthma   . Bradycardia   . CAD (coronary artery disease)    BIL.CAROTID ARTERY STENOSIS  . CHF (congestive heart failure) ([place])   . Chronic low back pain   . Cirrhosis of liver not due to alcohol ([place])   . Colon polyps   . Diabetes mellitus without complication ([place])   . Dyspnea   . Esophageal varices without bleeding ([place])   . GERD (gastroesophageal reflux disease)   . Hip pain   . Hyperlipidemia   . Hypertension   . IDA (iron deficiency anemia)   . 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 Sleep apnea   . SOB (shortness of breath)   . Stroke ([place])   . Temporal arteritis ([place])   . Thrombocytopenia ([place])   . Thrombocytopenia ([place])     Past Surgical History:  Procedure Laterality Date  . COLONOSCOPY  10/24/2013, 2002   hyperplastic polpys  . CORONARY ANGIOPLASTY    . CORONARY ARTERY BYPASS GRAFT  09/1998  . ESOPHAGOGASTRODUODENOSCOPY (EGD) WITH PROPOFOL N/A 08/26/2016   Procedure: ESOPHAGOGASTRODUODENOSCOPY (EGD) WITH PROPOFOL;  Surgeon: Manya Silvas, MD;  Location[facility] ENDOSCOPY;  Service: Endoscopy;  Laterality: N/A;  . ESOPHAGOGASTRODUODENOSCOPY (EGD) WITH PROPOFOL N/A 02/05/2018   Procedure: ESOPHAGOGASTRODUODENOSCOPY (EGD) WITH PROPOFOL;  Surgeon: Manya Silvas, MD;  Location: [facility] ENDOSCOPY;  Service: Endoscopy;  Laterality: N/A;  . ESOPHAGOGASTRODUODENOSCOPY (EGD) WITH PROPOFOL N/A 07/22/2019   Procedure: ESOPHAGOGASTRODUODENOSCOPY (EGD) WITH PROPOFOL;  Surgeon: Toledo, Benay Pike, MD;  Location: [facility] ENDOSCOPY;  Service: Gastroenterology;  Laterality: N/A;  . ESOPHAGOGASTRODUODENOSCOPY ENDOSCOPY  05/24/2011  . hemorroid surgery    . JOINT REPLACEMENT  2003  2005   bil.knees     reports that she has never smoked. She has never used smokeless tobacco. She reports that she does not drink alcohol and does not use drugs.  Allergies  Allergen Reactions  . Morphine And Related Nausea And Vomiting    Other reaction(s): Nausea And Vomiting  . Adhesive [Tape] Rash  . Other Rash    NEOPRENE, TAPE, BAND-AID TOUGH  STRIPS    Family History  Problem Relation Age of Onset  . Heart disease Other   . Hypertension Other   . Anemia Other   . Colon cancer Other   . CVA Mother   . Dementia Mother   . CAD Father   . Breast cancer Neg Hx       Prior to Admission medications   Medication Sig Start Date End Date Taking? Authorizing Provider  alendronate (FOSAMAX) 70 MG tablet Take 70 mg by mouth every Monday. Take with a full glass of water  on an empty stomach.   Yes [provider]  aspirin EC 81 MG tablet Take 81 mg by mouth daily.   Yes [provider]  benzonatate (TESSALON PERLES) 100 MG capsule Take 1 capsule (100 mg total) by mouth every 6 (six) hours as needed for cough. 09/27/19 09/26/20 Yes Sharen Hones, MD  blood glucose meter kit and supplies KIT Dispense based on patient and insurance preference. Use up to four times daily as directed. (FOR ICD-9 250.00, 250.01). 04/24/16  Yes Gladstone Lighter, MD  Calcium Carbonate-Vitamin D (CALCIUM-VITAMIN D) 500-200 MG-UNIT tablet Take 1 tablet by mouth 2 (two) times daily.    Yes [provider]  cefdinir (OMNICEF) 300 MG capsule Take 300 mg by mouth 2 (two) times daily. 10/28/19  Yes [provider]  Cranberry-Milk Thistle 250-75 MG CAPS Take 1 capsule by mouth 3 (three) times daily.    Yes [provider]  ferrous sulfate 325 (65 FE) MG tablet Take 325 mg by mouth daily.    Yes [provider]  gabapentin (NEURONTIN) 100 MG capsule Take 200 mg by mouth at bedtime.  01/26/15  Yes [provider]  levothyroxine (SYNTHROID, LEVOTHROID) 50 MCG tablet Take 50 mcg by mouth daily before breakfast.  03/31/15  Yes [provider]  meclizine (ANTIVERT) 12.5 MG tablet Take 12.5 mg by mouth 3 (three) times daily as needed for dizziness.   Yes [provider]  Melatonin 10 MG CAPS Take 10 mg by mouth at bedtime.    Yes [provider]  Menthol-Camphor (ICY HOT ADVANCED PAIN RELIEF) 16-11 % CREA Apply 1 application topically as directed.   Yes [provider]  metFORMIN (GLUCOPHAGE) 500 MG tablet Take 1,000 mg by mouth daily. In evening   Yes [provider]  metoprolol tartrate (LOPRESSOR) 25 MG tablet Take 25 mg by mouth in the morning and at bedtime. 07/22/19  Yes [provider]  omeprazole (PRILOSEC) 20 MG capsule Take 20 mg by mouth daily.  03/24/15  Yes [provider]   potassium chloride SA (K-DUR,KLOR-CON) 20 MEQ tablet Take 1 tablet (20 mEq total) by mouth 2 (two) times daily. 11/22/16  Yes Cammie Sickle, MD  rifaximin (XIFAXAN) 550 MG TABS tablet Take 550 mg by mouth 2 (two) times daily.  01/20/15  Yes [provider]  sacubitril-valsartan (ENTRESTO) 24-26 MG Take 0.5 tablets by mouth 2 (two) times daily. 09/27/19  Yes Sharen Hones, MD  simvastatin (ZOCOR) 20 MG tablet Take 20 mg by mouth at bedtime.  03/24/15  Yes [provider]  torsemide (DEMADEX) 20 MG tablet Take 20 mg by mouth 2 (two) times daily.   Yes [provider]    Physical Exam: Vitals:   11/01/19 1616 11/01/19 1936 11/01/19 2228 11/01/19 2300  BP: (!) 120/57 128/71 (!) 119/58 112/67  Pulse: 72 67 76 70  Resp: 17 18 14  (!) 6  Temp:  TempSrc:      SpO2: 97% 98% 97% 100%  Weight:         Vitals:   11/01/19 1616 11/01/19 1936 11/01/19 2228 11/01/19 2300  BP: (!) 120/57 128/71 (!) 119/58 112/67  Pulse: 72 67 76 70  Resp: 17 18 14  (!) 6  Temp:      TempSrc:      SpO2: 97% 98% 97% 100%  Weight:          Constitutional: Alert and oriented x 3 .  Coughing a lot HEENT:      Head: Normocephalic and atraumatic.         Eyes: PERLA, EOMI, Conjunctivae are normal. Sclera is non-icteric.       Mouth/Throat: Mucous membranes are moist.       Neck: Supple with no signs of meningismus. Cardiovascular: Regular rate and rhythm. No murmurs, gallops, or rubs. 2+ symmetrical distal pulses are present . No JVD. No LE edema Respiratory: Respiratory effort increased.Lungs sounds diminished bilaterally. No wheezes, crackles, or rhonchi.  Gastrointestinal: Soft, non tender, and non distended with positive bowel sounds. No rebound or guarding. Genitourinary: No CVA tenderness. Musculoskeletal: Nontender with normal range of motion in all extremities. No cyanosis, or erythema of extremities. Neurologic: Normal speech and language. Face is symmetric. Moving all  extremities. No gross focal neurologic deficits . Skin: Skin is warm, dry.  No rash or ulcers Psychiatric: Mood and affect are normal Speech and behavior are normal   Labs on Admission: I have personally reviewed following labs and imaging studies  CBC: Recent Labs  Lab 11/01/19 1246  WBC 4.2  HGB 11.5*  HCT 32.9*  MCV 88.7  PLT 59*   Basic Metabolic Panel: Recent Labs  Lab 11/01/19 1246  NA 136  K 4.4  CL 101  CO2 25  GLUCOSE 156*  BUN 24*  CREATININE 1.04*  CALCIUM 8.7*   GFR: Estimated Creatinine Clearance: 37.6 mL/min (A) (by C-G formula based on SCr of 1.04 mg/dL (H)). Liver Function Tests: No results for input(s): AST, ALT, ALKPHOS, BILITOT, PROT, ALBUMIN in the last 168 hours. No results for input(s): LIPASE, AMYLASE in the last 168 hours. No results for input(s): AMMONIA in the last 168 hours. Coagulation Profile: No results for input(s): INR, PROTIME in the last 168 hours. Cardiac Enzymes: No results for input(s): CKTOTAL, CKMB, CKMBINDEX, TROPONINI in the last 168 hours. BNP (last 3 results) No results for input(s): PROBNP in the last 8760 hours. HbA1C: No results for input(s): HGBA1C in the last 72 hours. CBG: No results for input(s): GLUCAP in the last 168 hours. Lipid Profile: No results for input(s): CHOL, HDL, LDLCALC, TRIG, CHOLHDL, LDLDIRECT in the last 72 hours. Thyroid Function Tests: No results for input(s): TSH, T4TOTAL, FREET4, T3FREE, THYROIDAB in the last 72 hours. Anemia Panel: No results for input(s): VITAMINB12, FOLATE, FERRITIN, TIBC, IRON, RETICCTPCT in the last 72 hours. Urine analysis:    Component Value Date/Time   COLORURINE YELLOW (A) 07/31/2019 1814   APPEARANCEUR CLEAR (A) 07/31/2019 1814   APPEARANCEUR Hazy 01/29/2014 2131   LABSPEC 1.006 07/31/2019 1814   LABSPEC 1.005 01/29/2014 2131   PHURINE 7.0 07/31/2019 1814   GLUCOSEU NEGATIVE 07/31/2019 1814   GLUCOSEU Negative 01/29/2014 2131   HGBUR NEGATIVE 07/31/2019 1814    BILIRUBINUR NEGATIVE 07/31/2019 1814   BILIRUBINUR Negative 01/29/2014 2131   KETONESUR NEGATIVE 07/31/2019 1814   PROTEINUR NEGATIVE 07/31/2019 1814   NITRITE NEGATIVE 07/31/2019 1814   LEUKOCYTESUR NEGATIVE 07/31/2019 1814  LEUKOCYTESUR 3+ 01/29/2014 2131    Radiological Exams on Admission: DG Chest 2 View  Result Date: 11/01/2019 CLINICAL DATA:  Productive cough EXAM: CHEST - 2 VIEW COMPARISON:  09/25/2019 FINDINGS: Cardiac shadow is stable. Postsurgical changes are again seen. Elevation of the right hemidiaphragm is noted with small right pleural effusion stable from the prior study. No new focal infiltrate is seen. No bony abnormality is noted. IMPRESSION: Right basilar atelectasis with associated effusions stable from the prior exam. Electronically Signed   By: Inez Catalina M.D.   On: 11/01/2019 13:20    EKG: Independently reviewed. Interpretation : Sinus rhythm with nonspecific ST-T wave changes  Assessment/Plan 79 year old female with diastolic heart failure, EF> 75% 08/2019, paroxysmal A. fib not on anticoagulation due to chronic thrombocytopenia,  HTN,sent from primary cardiologist office for inpatient treatment of CHF exacerbation after presenting with shortness of breath not responding to outpatient intensification of diuretic therapy.  Large right pleural effusion seen on CAT scan done at [facility] on 10/30/2019  Dyspnea, acute on chronic -Suspect multifactorial related to fluid overload from CHF, right pleural effusion and acute bronchitis -Treat each possible etiology as outlined  Acute on chronic diastolic (congestive) heart failure (HCC) -Patient short of breath, small pleural effusion on chest x-ray BNP minimally elevated at 129 -Shortness of breath may be due in part to mild bronchitis, patient was wheezing in the ER -IV Lasix.  Continue home beta-blocker and Entresto -Daily weights, intake and output monitoring -Last echo 09/25/2019 showed EF greater than 75% -Consider  cardiology consult in the a.m. if needed    Acute bronchitis -DuoNebs as needed  Right pleural effusion -Chest x-ray showed small effusion but patient had CT abdomen done at [facility] on 10/30/2019 that showed large right pleural effusion so thoracentesis can be considered    Thrombocytopenia (HCC) -Chronic and stable with platelet count in the 60,006 -SCDs for DVT prophylaxis    Type 2 diabetes mellitus with peripheral neuropathy ([place]) -Sliding scale insulin coverage pending med rec    Benign essential hypertension -Continue home antihypertensives    Paroxysmal A-fib ([place]) -Currently in sinus rhythm.  Continue beta-blocker -Not currently on rate control agents due to chronic thrombocytopenia    DVT prophylaxis: SCDs Code Status: full code  Family Communication:  none  Disposition Plan: Back to previous home environment Consults called: none  Status: Observation    Athena Masse MD [facility]     11/02/2019, 3:50 AM

## 2019-11-02 NOTE — ED Notes (Signed)
Pt states the purwick leaked- clean sheet placed on bed and purwick replaced

## 2019-11-02 NOTE — ED Provider Notes (Signed)
Patient here with acute on chronic shortness of breath.  Sent from CHF clinic.  Patient has moderate increased work of breathing despite giving DuoNeb's here.  While she may have a component of bronchitis, differential also includes symptomatic pleural effusions from her CHF.  She was seen in heart failure clinic and advised to come here for admission.  Given that she has persistent work of breathing increased, will admit for further management.  She will be given a dose of IV Lasix here.  Patient updated and in agreement.  No fever or signs of focal pneumonia on chest x-ray.  Covid is negative.   Duffy Bruce, MD 11/02/19 [phone]

## 2019-11-02 NOTE — ED Notes (Signed)
Report given to Ashley, RN

## 2019-11-02 NOTE — Progress Notes (Signed)
PHARMACIST - PHYSICIAN COMMUNICATION  CONCERNING: P&T Medication Policy Regarding Oral Bisphosphonates  RECOMMENDATION: Your order for alendronate (Fosamax), ibandronate (Boniva), or risedronate (Actonel) has been discontinued at this time.  If the patients post-hospital medical condition warrants safe use of this class of drugs, please resume the pre-hospital regimen upon discharge.  DESCRIPTION:  Alendronate (Fosamax), ibandronate (Boniva), and risedronate (Actonel) can cause severe esophageal erosions in patients who are unable to remain upright at least 30 minutes after taking this medication.   Since brief interruptions in therapy are thought to have minimal impact on bone mineral density, the [facility] has established that bisphosphonate orders should be routinely discontinued during hospitalization.   To override this safety policy and permit administration of Boniva, Fosamax, or Actonel in the hospital, prescribers must write DO NOT HOLD in the comments section when placing the order for this class of medications.   Pernell Dupre, PharmD, BCPS Clinical Pharmacist 11/02/2019 9:30 AM

## 2019-11-02 NOTE — ED Notes (Signed)
Pt switched to hospital bed but wants to stay in chair for a little while- pt given toothbrush and toothpaste

## 2019-11-02 NOTE — Social Work (Signed)
TOC CM/SW consult for SNF  Social work is following.

## 2019-11-03 ENCOUNTER — Encounter: Payer: Self-pay | Admitting: Internal Medicine

## 2019-11-03 DIAGNOSIS — I5033 Acute on chronic diastolic (congestive) heart failure: Secondary | ICD-10-CM | POA: Diagnosis not present

## 2019-11-03 LAB — GLUCOSE, CAPILLARY
Glucose-Capillary: 111 mg/dL — ABNORMAL HIGH (ref 70–99)
Glucose-Capillary: 178 mg/dL — ABNORMAL HIGH (ref 70–99)
Glucose-Capillary: 126 mg/dL — ABNORMAL HIGH (ref 70–99)
Glucose-Capillary: 150 mg/dL — ABNORMAL HIGH (ref 70–99)

## 2019-11-03 LAB — BASIC METABOLIC PANEL
Anion gap: 9 (ref 5–15)
BUN: 21 mg/dL (ref 8–23)
CO2: 28 mmol/L (ref 22–32)
Calcium: 8.9 mg/dL (ref 8.9–10.3)
Chloride: 103 mmol/L (ref 98–111)
Creatinine, Ser: 1.29 mg/dL — ABNORMAL HIGH (ref 0.44–1.00)
GFR calc Af Amer: 45 mL/min — ABNORMAL LOW (ref >60)
GFR calc non Af Amer: 39 mL/min — ABNORMAL LOW (ref >60)
Glucose, Bld: 116 mg/dL — ABNORMAL HIGH (ref 70–99)
Potassium: 3.7 mmol/L (ref 3.5–5.1)
Sodium: 140 mmol/L (ref 135–145)

## 2019-11-03 LAB — HEMOGLOBIN A1C
Hgb A1c MFr Bld: 5.7 % — ABNORMAL HIGH (ref 4.8–5.6)
Mean Plasma Glucose: 116.89 mg/dL

## 2019-11-03 LAB — MAGNESIUM: Magnesium: 2.3 mg/dL (ref 1.7–2.4)

## 2019-11-03 NOTE — Evaluation (Signed)
Physical Therapy Evaluation Patient Details Name: Theresa [name] MRN: [id number] DOB: [birth year] Today's Date: 11/03/2019   History of Present Illness  Pt is an 79 y.o. female presenting to hospital 8/6 with SOB.  Pt admitted with acute on chronic dyspnea, acute on chronic diastolic CHF, acute bronchitis, R pleural effusion, thrombocytopenia, paroxysmal a-fib.  PMH includes anemia, bradycardia, CAD, CHF, chronic LBP, DM, htn, SOB, stroke, h/o B knee replacements, cryptogenic cirrhosis.  Clinical Impression  Prior to hospital admission, pt was independent with ambulation; lives with her husband on main level of home (level entry but 2 steps to get to bedroom); h/o falls.  Currently pt is SBA semi-supine to sitting edge of bed; CGA with transfers; and CGA with ambulation 100 feet (no AD).  Pt fairly steady with ambulation (no loss of balance) but limited distance ambulating d/t SOB (O2 sats 90% or greater on room air during sessions activities and 92% at rest end of session); HR 65-76 bpm with activity.  Pt would benefit from skilled PT to address noted impairments and functional limitations (see below for any additional details).  Upon hospital discharge, pt would benefit from [place].    Follow Up Recommendations Home health PT    Equipment Recommendations  None recommended by PT    Recommendations for Other Services       Precautions / Restrictions Precautions Precautions: Fall Restrictions Weight Bearing Restrictions: No      Mobility  Bed Mobility Overal bed mobility: Needs Assistance Bed Mobility: Supine to Sit     Supine to sit: Supervision;HOB elevated     General bed mobility comments: mild increased effort to perform on own  Transfers Overall transfer level: Needs assistance Equipment used: None Transfers: Sit to/from Stand Sit to Stand: Min guard         General transfer comment: mild increased effort to stand from bed on own  Ambulation/Gait Ambulation/Gait  assistance: Min guard Gait Distance (Feet): 100 Feet Assistive device: None   Gait velocity: decreased   General Gait Details: partial step through gait pattern; fairly steady without loss of balance; limited distance ambulating d/t SOB  Stairs            Wheelchair Mobility    Modified Rankin (Stroke Patients Only)       Balance Overall balance assessment: Needs assistance Sitting-balance support: No upper extremity supported;Feet supported Sitting balance-Leahy Scale: Normal Sitting balance - Comments: steady sitting reaching outside BOS   Standing balance support: No upper extremity supported;During functional activity Standing balance-Leahy Scale: Good Standing balance comment: no loss of balance noted with ambulation or functional mobility                             Pertinent Vitals/Pain Pain Assessment: No/denies pain    Home Living Family/patient expects to be discharged to:: Private residence Living Arrangements: Spouse/significant other Available Help at Discharge: Family;Available 24 hours/day Type of Home: House Home Access: Level entry     Home Layout: Two level;Able to live on main level with bedroom/bathroom Home Equipment: Grab bars - tub/shower;Shower seat - built in;Cane - single point;Walker - 4 wheels      Prior Function Level of Independence: Independent         Comments: Pt reports hospitalization in May and had 2 falls after she discharged home (broke her R wrist on 2nd fall--pt reports no issues or precautions anymore)     Hand Dominance  Extremity/Trunk Assessment   Upper Extremity Assessment Upper Extremity Assessment: Generalized weakness    Lower Extremity Assessment Lower Extremity Assessment: Generalized weakness    Cervical / Trunk Assessment Cervical / Trunk Assessment: Normal  Communication   Communication: No difficulties  Cognition Arousal/Alertness: Awake/alert Behavior During Therapy:  WFL for tasks assessed/performed Overall Cognitive Status: Within Functional Limits for tasks assessed                                        General Comments   Nursing cleared pt for participation in physical therapy.  Pt agreeable to PT session.    Exercises     Assessment/Plan    PT Assessment Patient needs continued PT services  PT Problem List Decreased strength;Decreased activity tolerance;Decreased balance;Decreased mobility;Cardiopulmonary status limiting activity       PT Treatment Interventions DME instruction;Gait training;Stair training;Functional mobility training;Therapeutic activities;Therapeutic exercise;Balance training;Patient/family education    PT Goals (Current goals can be found in the Care Plan section)  Acute Rehab PT Goals Patient Stated Goal: to improve breathing PT Goal Formulation: With patient Time For Goal Achievement: 11/17/19 Potential to Achieve Goals: Good    Frequency Min 2X/week   Barriers to discharge        Co-evaluation               AM-PAC PT "6 Clicks" Mobility  Outcome Measure Help needed turning from your back to your side while in a flat bed without using bedrails?: None Help needed moving from lying on your back to sitting on the side of a flat bed without using bedrails?: A Little Help needed moving to and from a bed to a chair (including a wheelchair)?: A Little Help needed standing up from a chair using your arms (e.g., wheelchair or bedside chair)?: A Little Help needed to walk in hospital room?: A Little Help needed climbing 3-5 steps with a railing? : A Little 6 Click Score: 19    End of Session Equipment Utilized During Treatment: Gait belt Activity Tolerance: Other (comment) (Limited d/t SOB with activity) Patient left: in chair;with call bell/phone within reach;with chair alarm set;with SCD's reapplied Nurse Communication: Mobility status;Precautions PT Visit Diagnosis: Other abnormalities of  gait and mobility (R26.89);Muscle weakness (generalized) (M62.81);History of falling (Z91.81)    Time: 7846-9629 PT Time Calculation (min) (ACUTE ONLY): 26 min   Charges:   PT Evaluation $PT Eval Low Complexity: 1 Low PT Treatments $Therapeutic Activity: 8-22 mins       Leitha Bleak, PT 11/03/19, 12:22 PM

## 2019-11-03 NOTE — Evaluation (Signed)
Occupational Therapy Evaluation Patient Details Name: Theresa [name] MRN: [id number] DOB: Nov 11, 1940 Today's Date: 11/03/2019    History of Present Illness Pt is an 79 y.o. female presenting to hospital 8/6 with SOB.  Pt admitted with acute on chronic dyspnea, acute on chronic diastolic CHF, acute bronchitis, R pleural effusion, thrombocytopenia, paroxysmal a-fib.  PMH includes anemia, bradycardia, CAD, CHF, chronic LBP, DM, htn, SOB, stroke, h/o B knee replacements, cryptogenic cirrhosis.   Clinical Impression   Pt was seen for OT evaluation this date. Prior to hospital admission, pt was Indep with all aspects of self care. Pt lives with spouse in two level home (able to live on main floor) with level entry. Currently pt demonstrates impairments as described below (See OT problem list) which functionally limit her ability to perform ADL/self-care tasks. Pt is currently able to perform tasks at MOD I level, but demos decreased fxl activity tolerance and becomes SOB with extensive standing activity or more dynamic activity.  Extended time spent with pt and spouse covering EC strategies for home as well as potentially beneficial equipment. OT recommends pt uses shower seat for bathing safety upon d/c and have supv for standing/fxl mobility within first few days to weeks home. Do not currently anticipate acute OT needs nor need for OT follow up upon d/c as all education completed after evaluation. Pt and spouse with good understanding and handout issued.     Follow Up Recommendations  No OT follow up;Supervision - Intermittent (supv for OOB/OOC intially on d/c and for bathing for safety with wet surface.)    Equipment Recommendations  Tub/shower seat    Recommendations for Other Services       Precautions / Restrictions Precautions Precautions: Fall Restrictions Weight Bearing Restrictions: No      Mobility Bed Mobility               General bed mobility comments: up to chair pre/post  session  Transfers Overall transfer level: Needs assistance Equipment used: None Transfers: Sit to/from Stand Sit to Stand: Min guard;Supervision         General transfer comment: CGA initially with progress to supv    Balance Overall balance assessment: Needs assistance Sitting-balance support: No upper extremity supported;Feet supported Sitting balance-Leahy Scale: Normal     Standing balance support: No upper extremity supported;During functional activity Standing balance-Leahy Scale: Good                             ADL either performed or assessed with clinical judgement   ADL Overall ADL's : At baseline                                       General ADL Comments: able to perform all aspects of self care at MOD I level-increased time. And is noted to fatigue quickly, but performs with no assist from OT. Ed provided re: modifications/EC strategies and handout issued. Husband present for education as well.     Vision Patient Visual Report: No change from baseline       Perception     Praxis      Pertinent Vitals/Pain Pain Assessment: No/denies pain     Hand Dominance     Extremity/Trunk Assessment Upper Extremity Assessment Upper Extremity Assessment: Overall WFL for tasks assessed;Generalized weakness   Lower Extremity Assessment Lower Extremity Assessment: Overall WFL for  tasks assessed;Generalized weakness   Cervical / Trunk Assessment Cervical / Trunk Assessment: Normal   Communication Communication Communication: No difficulties   Cognition Arousal/Alertness: Awake/alert Behavior During Therapy: WFL for tasks assessed/performed Overall Cognitive Status: Within Functional Limits for tasks assessed                                     General Comments       Exercises Other Exercises Other Exercises: OT facilitaets education with pt and spouse re: EC strategies for home. Pt and spouse with good  understanding, handout issued. Extended time addressing modifications as well as potentially beneficial equipment.   Shoulder Instructions      Home Living Family/patient expects to be discharged to:: Private residence Living Arrangements: Spouse/significant other Available Help at Discharge: Family;Available 24 hours/day Type of Home: House Home Access: Level entry     Home Layout: Two level;Able to live on main level with bedroom/bathroom Alternate Level Stairs-Number of Steps: 2 steps to bedroom (no rail but has buffet on L side going up she can hold onto)   Bathroom Shower/Tub: Occupational psychologist: Standard     Home Equipment: Grab bars - tub/shower;Shower seat - built in;Cane - single point;Walker - 4 wheels          Prior Functioning/Environment Level of Independence: Independent        Comments: Pt reports hospitalization in May and had 2 falls after she discharged home (broke her R wrist on 2nd fall--pt reports no issues or precautions anymore)        OT Problem List: Decreased activity tolerance;Cardiopulmonary status limiting activity      OT Treatment/Interventions:      OT Goals(Current goals can be found in the care plan section) Acute Rehab OT Goals Patient Stated Goal: to improve breathing OT Goal Formulation: All assessment and education complete, DC therapy  OT Frequency:     Barriers to D/C:            Co-evaluation              AM-PAC OT "6 Clicks" Daily Activity     Outcome Measure Help from another person eating meals?: None Help from another person taking care of personal grooming?: None Help from another person toileting, which includes using toliet, bedpan, or urinal?: None Help from another person bathing (including washing, rinsing, drying)?: A Little Help from another person to put on and taking off regular upper body clothing?: None Help from another person to put on and taking off regular lower body clothing?:  None 6 Click Score: 23   End of Session Equipment Utilized During Treatment: Gait belt Nurse Communication: Mobility status;Other (comment) (notified O2 dropped frmo 97% on RA to 93% on RA with fxl mobility. While still WFL, pt feels somewhat winded and demos increased WOB, but recovers within ~2 mins of seated rest.)  Activity Tolerance: Patient tolerated treatment well Patient left: in chair;with call bell/phone within reach;with chair alarm set;with family/visitor present  OT Visit Diagnosis: Other abnormalities of gait and mobility (R26.89)                Time: 8756-4332 OT Time Calculation (min): 53 min Charges:  OT General Charges $OT Visit: 1 Visit OT Evaluation $OT Eval Moderate Complexity: 1 Mod OT Treatments $Self Care/Home Management : 23-37 mins $Therapeutic Activity: 8-22 mins  Gerrianne Scale, [place], OTR/L ascom [phone] 11/03/19,  8:28 PM

## 2019-11-03 NOTE — ED Notes (Signed)
Spoke with Merry Proud on 2A, states room patient is assigned to is currently being cleaned.

## 2019-11-03 NOTE — Progress Notes (Signed)
Progress Note    Theresa [name]  HYI:[id number] DOB: 1940/11/15  DOA: 11/01/2019 PCP: Tracie Harrier, MD      Brief Narrative:    Medical records reviewed and are as summarized below:  Theresa [name] is a 79 y.o. female       Assessment/Plan:   Principal Problem:   Acute on chronic diastolic (congestive) heart failure ([place]) Active Problems:   Thrombocytopenia (HCC)   Type 2 diabetes mellitus with peripheral neuropathy (HCC)   Acute bronchitis   Benign essential hypertension   Paroxysmal A-fib (HCC)   Pleural effusion on right   Acute on chronic diastolic CHF (2D echo on 10/02/6765 showed EF greater than 20%, grade 3 diastolic dysfunction, mild to moderate MR, mild to moderate TR) Right pleural effusion Acute bronchitis Type II DM with peripheral neuropathy Liver cirrhosis with chronic thrombocytopenia Paroxysmal atrial fibrillation History of stroke Hypotension (she said she has had issues with low blood pressure in the past) Elevated creatinine, suspect patient has underlying CKD stage IIIa.  Of note, her creatinine has been fluctuating over the past year.  Creatinine on 09/27/2019 was 0.83 but creatinine is up to 1.29.  PLAN  Hold IV Lasix because of dizziness, hypotension and elevated creatinine Check orthostatic vital signs Hold Entresto because of hypotension and elevated creatinine.  Continue to monitor BP and creatinine. Continue metoprolol for A. fib if BP allows Continue aspirin for stroke prophylaxis Continue Omnicef was prescribed for possible lower respiratory tract infection about a week prior to admission. Repeat chest x-ray to evaluate right pleural effusion.    Body mass index is 31.25 kg/m.  (Obesity): This complicates overall care and prognosis  Diet Order            Diet heart healthy/carb modified Room service appropriate? Yes; Fluid consistency: Thin  Diet effective now                       Medications:   . aspirin EC   81 mg Oral Daily  . calcium-vitamin D  1 tablet Oral BID  . cefdinir  300 mg Oral BID  . ferrous sulfate  325 mg Oral Daily  . gabapentin  200 mg Oral QHS  . insulin aspart  0-15 Units Subcutaneous TID WC  . insulin aspart  0-5 Units Subcutaneous QHS  . levothyroxine  50 mcg Oral QAC breakfast  . melatonin  10 mg Oral QHS  . metoprolol tartrate  25 mg Oral BID  . pantoprazole  40 mg Oral Daily  . potassium chloride SA  20 mEq Oral BID  . rifaximin  550 mg Oral BID  . simvastatin  20 mg Oral QHS  . sodium chloride flush  3 mL Intravenous Q12H   Continuous Infusions: . sodium chloride       Anti-infectives (From admission, onward)   Start     Dose/Rate Route Frequency Ordered Stop   11/02/19 1000  rifaximin (XIFAXAN) tablet 550 mg     Discontinue     550 mg Oral 2 times daily 11/02/19 0825     11/02/19 1000  cefdinir (OMNICEF) capsule 300 mg     Discontinue     300 mg Oral 2 times daily 11/02/19 0825               Family Communication/Anticipated D/C date and plan/Code Status   DVT prophylaxis: SCDs Start: 11/02/19 0346     Code Status: Full Code  Family Communication:  Plan discussed with patient Disposition Plan:    Status is: Observation  The patient will require care spanning > 2 midnights and should be moved to inpatient because: Inpatient level of care appropriate due to severity of illness  Dispo: The patient is from: Home              Anticipated d/c is to: Home              Anticipated d/c date is: 1 day              Patient currently is not medically stable to d/c.           Subjective:   C/o dizziness. Breathing is a little better  Objective:    Vitals:   11/03/19 0620 11/03/19 1120 11/03/19 1459 11/03/19 1503  BP: (!) 96/53 (!) 106/55 (!) 96/56 99/60  Pulse: 67 60 (!) 49 (!) 57  Resp: 17 19 19    Temp: 98.6 F (37 C) (!) 97.4 F (36.3 C) (!) 97.5 F (36.4 C)   TempSrc:  Oral Oral   SpO2: 94% 100% 94%   Weight:       No data  found.   Intake/Output Summary (Last 24 hours) at 11/03/2019 1506 Last data filed at 11/03/2019 0900 Gross per 24 hour  Intake --  Output 650 ml  Net -650 ml   Filed Weights   11/01/19 1243 11/03/19 0128  Weight: 72.3 kg 72.6 kg    Exam:  GEN: NAD SKIN: Warm and dry EYES: EOMI ENT: MMM CV: RRR PULM: CTA B ABD: soft, ND, NT, +BS CNS: AAO x 3, non focal EXT: No edema or tenderness   Data Reviewed:   I have personally reviewed following labs and imaging studies:  Labs: Labs show the following:   Basic Metabolic Panel: Recent Labs  Lab 11/01/19 1246 11/03/19 0702  NA 136 140  K 4.4 3.7  CL 101 103  CO2 25 28  GLUCOSE 156* 116*  BUN 24* 21  CREATININE 1.04* 1.29*  CALCIUM 8.7* 8.9  MG  --  2.3   GFR Estimated Creatinine Clearance: 30.4 mL/min (A) (by C-G formula based on SCr of 1.29 mg/dL (H)). Liver Function Tests: No results for input(s): AST, ALT, ALKPHOS, BILITOT, PROT, ALBUMIN in the last 168 hours. No results for input(s): LIPASE, AMYLASE in the last 168 hours. No results for input(s): AMMONIA in the last 168 hours. Coagulation profile No results for input(s): INR, PROTIME in the last 168 hours.  CBC: Recent Labs  Lab 11/01/19 1246  WBC 4.2  HGB 11.5*  HCT 32.9*  MCV 88.7  PLT 59*   Cardiac Enzymes: No results for input(s): CKTOTAL, CKMB, CKMBINDEX, TROPONINI in the last 168 hours. BNP (last 3 results) No results for input(s): PROBNP in the last 8760 hours. CBG: Recent Labs  Lab 11/02/19 1341 11/02/19 1829 11/02/19 2227 11/03/19 0723 11/03/19 1231  GLUCAP 95 160* 119* 111* 178*   D-Dimer: No results for input(s): DDIMER in the last 72 hours. Hgb A1c: Recent Labs    11/02/19 0703  HGBA1C 5.7*   Lipid Profile: No results for input(s): CHOL, HDL, LDLCALC, TRIG, CHOLHDL, LDLDIRECT in the last 72 hours. Thyroid function studies: No results for input(s): TSH, T4TOTAL, T3FREE, THYROIDAB in the last 72 hours.  Invalid input(s):  FREET3 Anemia work up: No results for input(s): VITAMINB12, FOLATE, FERRITIN, TIBC, IRON, RETICCTPCT in the last 72 hours. Sepsis Labs: Recent Labs  Lab 11/01/19 1246  WBC 4.2  Microbiology Recent Results (from the past 240 hour(s))  SARS Coronavirus 2 by RT PCR (hospital order, performed in [facility] hospital lab) Nasopharyngeal Nasopharyngeal Swab     Status: None   Collection Time: 11/02/19 12:16 AM   Specimen: Nasopharyngeal Swab  Result Value Ref Range Status   SARS Coronavirus 2 NEGATIVE NEGATIVE Final    Comment: (NOTE) SARS-CoV-2 target nucleic acids are NOT DETECTED.  The SARS-CoV-2 RNA is generally detectable in upper and lower respiratory specimens during the acute phase of infection. The lowest concentration of SARS-CoV-2 viral copies this assay can detect is 250 copies / mL. A negative result does not preclude SARS-CoV-2 infection and should not be used as the sole basis for treatment or other patient management decisions.  A negative result may occur with improper specimen collection / handling, submission of specimen other than nasopharyngeal swab, presence of viral mutation(s) within the areas targeted by this assay, and inadequate number of viral copies (<250 copies / mL). A negative result must be combined with clinical observations, patient history, and epidemiological information.  Fact Sheet for Patients:   StrictlyIdeas.no  Fact Sheet for Healthcare Providers: BankingDealers.co.za  This test is not yet approved or  cleared by the [place] FDA and has been authorized for detection and/or diagnosis of SARS-CoV-2 by FDA under an Emergency Use Authorization (EUA).  This EUA will remain in effect (meaning this test can be used) for the duration of the COVID-19 declaration under Section 564(b)(1) of the Act, 21 U.S.C. section 360bbb-3(b)(1), unless the authorization is terminated or revoked  sooner.  Performed at [facility], [address] [place], [place] [address]     Procedures and diagnostic studies:  No results found.             LOS: 0 days   [facility]   Pager [phone]. If 7PM-7AM, please contact night-coverage at [email or web address]     11/03/2019, 3:06 PM

## 2019-11-04 ENCOUNTER — Observation Stay: Payer: Medicare Other

## 2019-11-04 DIAGNOSIS — I480 Paroxysmal atrial fibrillation: Secondary | ICD-10-CM | POA: Diagnosis not present

## 2019-11-04 DIAGNOSIS — I5033 Acute on chronic diastolic (congestive) heart failure: Secondary | ICD-10-CM | POA: Diagnosis not present

## 2019-11-04 DIAGNOSIS — I10 Essential (primary) hypertension: Secondary | ICD-10-CM | POA: Diagnosis not present

## 2019-11-04 DIAGNOSIS — J90 Pleural effusion, not elsewhere classified: Secondary | ICD-10-CM | POA: Diagnosis not present

## 2019-11-04 LAB — BASIC METABOLIC PANEL
Anion gap: 8 (ref 5–15)
BUN: 23 mg/dL (ref 8–23)
CO2: 27 mmol/L (ref 22–32)
Calcium: 9.2 mg/dL (ref 8.9–10.3)
Chloride: 101 mmol/L (ref 98–111)
Creatinine, Ser: 1.09 mg/dL — ABNORMAL HIGH (ref 0.44–1.00)
GFR calc Af Amer: 55 mL/min — ABNORMAL LOW (ref >60)
GFR calc non Af Amer: 48 mL/min — ABNORMAL LOW (ref >60)
Glucose, Bld: 122 mg/dL — ABNORMAL HIGH (ref 70–99)
Potassium: 4 mmol/L (ref 3.5–5.1)
Sodium: 136 mmol/L (ref 135–145)

## 2019-11-04 LAB — MAGNESIUM: Magnesium: 2.6 mg/dL — ABNORMAL HIGH (ref 1.7–2.4)

## 2019-11-04 LAB — BODY FLUID CELL COUNT WITH DIFFERENTIAL
Eos, Fluid: 0 %
Lymphs, Fluid: 33 %
Monocyte-Macrophage-Serous Fluid: 55 %
Neutrophil Count, Fluid: 11 %
Other Cells, Fluid: 1 %
Total Nucleated Cell Count, Fluid: 268 cu mm

## 2019-11-04 LAB — PROTEIN, PLEURAL OR PERITONEAL FLUID: Total protein, fluid: <3 g/dL

## 2019-11-04 LAB — GLUCOSE, CAPILLARY
Glucose-Capillary: 119 mg/dL — ABNORMAL HIGH (ref 70–99)
Glucose-Capillary: 194 mg/dL — ABNORMAL HIGH (ref 70–99)

## 2019-11-04 LAB — LACTATE DEHYDROGENASE, PLEURAL OR PERITONEAL FLUID: LD, Fluid: 56 U/L — ABNORMAL HIGH (ref 3–23)

## 2019-11-04 MED ORDER — TORSEMIDE 20 MG PO TABS
20.0000 mg | ORAL_TABLET | Freq: Two times a day (BID) | ORAL | Status: DC
  Administered 2019-11-04: 20 mg via ORAL
  Filled 2019-11-04: qty 1

## 2019-11-04 NOTE — Discharge Instructions (Signed)
Heart Failure, Diagnosis  Heart failure means that your heart is not able to pump blood in the right way. This makes it hard for your body to work well. Heart failure is usually a long-term (chronic) condition. You must take good care of yourself and follow your treatment plan from your doctor. What are the causes? This condition may be caused by:  High blood pressure.  Build up of cholesterol and fat in the arteries.  Heart attack. This injures the heart muscle.  Heart valves that do not open and close properly.  Damage of the heart muscle. This is also called cardiomyopathy.  Lung disease.  Abnormal heart rhythms. What increases the risk? The risk of heart failure goes up as a person ages. This condition is also more likely to develop in people who:  Are overweight.  Are female.  Smoke or chew tobacco.  Abuse alcohol or illegal drugs.  Have taken medicines that can damage the heart.  Have diabetes.  Have abnormal heart rhythms.  Have thyroid problems.  Have low blood counts (anemia). What are the signs or symptoms? Symptoms of this condition include:  Shortness of breath.  Coughing.  Swelling of the feet, ankles, legs, or belly.  Losing weight for no reason.  Trouble breathing.  Waking from sleep because of the need to sit up and get more air.  Rapid heartbeat.  Being very tired.  Feeling dizzy, or feeling like you may pass out (faint).  Having no desire to eat.  Feeling like you may vomit (nauseous).  Peeing (urinating) more at night.  Feeling confused. How is this treated?     This condition may be treated with:  Medicines. These can be given to treat blood pressure and to make the heart muscles stronger.  Changes in your daily life. These may include eating a healthy diet, staying at a healthy body weight, quitting tobacco and illegal drug use, or doing exercises.  Surgery. Surgery can be done to open blocked valves, or to put devices in  the heart, such as pacemakers.  A donor heart (heart transplant). You will receive a healthy heart from a donor. Follow these instructions at home:  Treat other conditions as told by your doctor. These may include high blood pressure, diabetes, thyroid disease, or abnormal heart rhythms.  Learn as much as you can about heart failure.  Get support as you need it.  Keep all follow-up visits as told by your doctor. This is important. Summary  Heart failure means that your heart is not able to pump blood in the right way.  This condition is caused by high blood pressure, heart attack, or damage of the heart muscle.  Symptoms of this condition include shortness of breath and swelling of the feet, ankles, legs, or belly. You may also feel very tired or feel like you may vomit.  You may be treated with medicines, surgery, or changes in your daily life.  Treat other health conditions as told by your doctor. This information is not intended to replace advice given to you by your health care provider. Make sure you discuss any questions you have with your health care provider. Document Revised: 06/01/2018 Document Reviewed: 06/01/2018 Elsevier Patient Education  [place].   Heart Failure, Diagnosis  Heart failure is a condition in which the heart has trouble pumping blood because it has become weak or stiff. This means that the heart does not pump blood well enough for the body to stay healthy. For  some people with heart failure, fluid may back up into the lungs. There may also be swelling (edema) in the lower legs. Heart failure is usually a long-term (chronic) condition. It is important for you to take good care of yourself and follow the treatment plan from your health care provider. What are the causes? This condition may be caused by:  High blood pressure (hypertension). Hypertension causes the heart muscle to work harder than normal. This makes the heart stiff or  weak.  Coronary artery disease, or CAD. CAD is the buildup of cholesterol and fat (plaque) in the arteries of the heart.  Heart attack, also called myocardial infarction. This injures the heart muscle, making it hard for the heart to pump blood.  Abnormal heart valves. The valves do not open and close properly, forcing the heart to pump harder to keep the blood flowing.  Heart muscle disease (cardiomyopathy or myocarditis). This is damage to the heart muscle. It can increase the risk of heart failure.  Lung disease. The heart works harder when the lungs are not healthy.  Abnormal heart rhythms. These can lead to heart failure. What increases the risk? The risk of heart failure increases as a person ages. This condition is also more likely to develop in people who:  Are overweight.  Are female.  Smoke or chew tobacco.  Abuse alcohol or illegal drugs.  Have taken medicines that can damage the heart, such as chemotherapy drugs.  Have diabetes.  Have abnormal heart rhythms.  Have thyroid problems.  Have low blood counts (anemia). What are the signs or symptoms? Symptoms of this condition include:  Shortness of breath with activity, such as when climbing stairs.  A cough that does not go away.  Swelling of the feet, ankles, legs, or abdomen.  Losing weight for no reason.  Trouble breathing when lying flat (orthopnea).  Waking from sleep because of the need to sit up and get more air.  Rapid heartbeat.  Tiredness (fatigue) and loss of energy.  Feeling light-headed, dizzy, or close to fainting.  Loss of appetite.  Nausea.  Waking up more often during the night to urinate (nocturia).  Confusion. How is this diagnosed? This condition is diagnosed based on:  Your medical history, symptoms, and a physical exam.  Diagnostic tests, which may include: ? Echocardiogram. ? Electrocardiogram (ECG). ? Chest X-ray. ? Blood tests. ? Exercise stress  test. ? Radionuclide scans. ? Cardiac catheterization and angiogram. How is this treated? Treatment for this condition is aimed at managing the symptoms of heart failure. Medicines Treatment may include medicines that:  Help lower blood pressure by relaxing (dilating) the blood vessels. These medicines are called ACE inhibitors (angiotensin-converting enzyme) and ARBs (angiotensin receptor blockers).  Cause the kidneys to remove salt and water from the blood through urination (diuretics).  Improve heart muscle strength and prevent the heart from beating too fast (beta blockers).  Increase the force of the heartbeat (digoxin). Healthy behavior changes     Treatment may also include making healthy lifestyle changes, such as:  Reaching and staying at a healthy weight.  Quitting smoking or chewing tobacco.  Eating heart-healthy foods.  Limiting or avoiding alcohol.  Stopping the use of illegal drugs.  Being physically active.  Other treatments Other treatments may include:  Procedures to open blocked arteries or repair damaged valves.  Placing a pacemaker to improve heart function (cardiac resynchronization therapy).  Placing a device to treat serious abnormal heart rhythms (implantable cardioverter defibrillator, or ICD).  Placing a device to improve the pumping ability of the heart (left ventricular assist device, or LVAD).  Receiving a healthy heart from a donor (heart transplant). This is done when other treatments have not helped. Follow these instructions at home:  Manage other health conditions as told by your health care provider. These may include hypertension, diabetes, thyroid disease, or abnormal heart rhythms.  Get ongoing education and support as needed. Learn as much as you can about heart failure.  Keep all follow-up visits as told by your health care provider. This is important. Summary  Heart failure is a condition in which the heart has trouble  pumping blood because it has become weak or stiff.  This condition is caused by high blood pressure and other diseases of the heart and lungs.  Symptoms of this condition include shortness of breath, tiredness (fatigue), nausea, and swelling of the feet, ankles, legs, or abdomen.  Treatments for this condition may include medicines, lifestyle changes, and surgery.  Manage other health conditions as told by your health care provider. This information is not intended to replace advice given to you by your health care provider. Make sure you discuss any questions you have with your health care provider. Document Revised: 06/01/2018 Document Reviewed: 06/01/2018 Elsevier Patient Education  [place].

## 2019-11-04 NOTE — Progress Notes (Signed)
Pt leaving unit for ultra sound.

## 2019-11-04 NOTE — Plan of Care (Signed)
  Problem: Education: Goal: Knowledge of General Education information will improve Description Including pain rating scale, medication(s)/side effects and non-pharmacologic comfort measures Outcome: Progressing   Problem: Health Behavior/Discharge Planning: Goal: Ability to manage health-related needs will improve Outcome: Progressing   

## 2019-11-04 NOTE — Procedures (Signed)
PROCEDURE SUMMARY:  Successful US guided right thoracentesis. Yielded 1.2 L of slightly hazy dark yellow fluid. Pt tolerated procedure well. No immediate complications.  Specimen was sent for labs. CXR ordered.  EBL < 5 mL  Ascencion Dike PA-C 11/04/2019 10:14 AM

## 2019-11-04 NOTE — TOC Progression Note (Signed)
Transition of Care [facility]) - Progression Note    Patient Details  Name: Theresa [name] MRN: [id number] Date of Birth: Feb 23, 1941  Transition [facility]) CM/SW Contact  Meriel Flavors, [place] Phone Number: 11/04/2019, 1:32 PM  Clinical Narrative:    8/9: TOC consult in for HFS Patient has completed HFS every time she has been here, as per patient  Transition of Care [facility]) CM/SW [place], RN Phone Number: 09/26/2019, 1:56 PM  Clinical Narrative:     Went to talk to patient about Heart Failure Screen, she reports having a blood pressure and scale that she checks everyday.  I instructed her to notify her doctor of weight gain of 2 lbs or more.No further TOC needs at this time, please re-consult for new needs.          Expected Discharge Plan and Services           Expected Discharge Date: 11/04/19                                     Social Determinants of Health (SDOH) Interventions    Readmission Risk Interventions No flowsheet data found.

## 2019-11-04 NOTE — Discharge Summary (Signed)
Physician Discharge Summary  Theresa [name] HAL:[id number] DOB: 1940/05/11 DOA: 11/01/2019  PCP: Tracie Harrier, MD  Admit date: 11/01/2019 Discharge date: 11/04/2019  Discharge disposition: Home   Recommendations for Outpatient Follow-Up:   Follow-up with PCP in 1 week Follow-up at CHF clinic as scheduled   Discharge Diagnosis:   Principal Problem:   Acute on chronic diastolic (congestive) heart failure ([place]) Active Problems:   Thrombocytopenia (HCC)   Type 2 diabetes mellitus with peripheral neuropathy (HCC)   Acute bronchitis   Benign essential hypertension   Paroxysmal A-fib (HCC)   Pleural effusion on right    Discharge Condition: Stable.  Diet recommendation:  Diet Order            Diet - low sodium heart healthy           Diet Carb Modified           Diet heart healthy/carb modified Room service appropriate? Yes; Fluid consistency: Thin  Diet effective now                   Code Status: Full Code     Hospital Course:   [name] is a 79 y.o. female with medical history significant for diastolic heart failure, cryptogenic cirrhosis, followed at [facility], paroxysmal A. fib not on anticoagulation due to chronic thrombocytopenia, CKD stage IIIa, CAD, OSA, HTN, hospitalized a month prior with heart failure exacerbation.  She presented to the hospital because of increasing shortness of breath.  She was admitted to the hospital for acute dissipation of chronic diastolic CHF and right pleural effusion.  She was treated with IV Lasix.  She underwent right-sided thoracentesis and 1.2 L of pleural fluid was removed.  Her blood pressure has been on the low side.  She said she has had problems with hypotension at home.  She said her blood pressure dropped into the 70s a few months ago and she had to be given IV fluids by EMS.  Delene Loll was held at discharge because of hypotension.  Her symptoms have resolved.  Her condition has improved and she is deemed stable for  discharge to home today.     Discharge Exam:    Vitals:   11/04/19 0724 11/04/19 0939 11/04/19 1009 11/04/19 1152  BP: 121/72 (!) 121/53 (!) 121/55 (!) 96/54  Pulse: 72 65  (!) 53  Resp: 17   18  Temp: 97.6 F (36.4 C)   97.6 F (36.4 C)  TempSrc: Oral     SpO2: 98% 97% 100% 97%  Weight:         GEN: NAD SKIN: No rash EYES: EOMI ENT: MMM CV: RRR PULM: CTA B ABD: soft, obese, NT, +BS CNS: AAO x 3, non focal EXT: No edema or tenderness   The results of significant diagnostics from this hospitalization (including imaging, microbiology, ancillary and laboratory) are listed below for reference.     Procedures and Diagnostic Studies:   DG Chest 2 View  Result Date: 11/01/2019 CLINICAL DATA:  Productive cough EXAM: CHEST - 2 VIEW COMPARISON:  09/25/2019 FINDINGS: Cardiac shadow is stable. Postsurgical changes are again seen. Elevation of the right hemidiaphragm is noted with small right pleural effusion stable from the prior study. No new focal infiltrate is seen. No bony abnormality is noted. IMPRESSION: Right basilar atelectasis with associated effusions stable from the prior exam. Electronically Signed   By: Inez Catalina M.D.   On: 11/01/2019 13:20     Labs:  Basic Metabolic Panel: Recent Labs  Lab 11/01/19 1246 11/01/19 1246 11/03/19 0702 11/04/19 0649  NA 136  --  140 136  K 4.4   < > 3.7 4.0  CL 101  --  103 101  CO2 25  --  28 27  GLUCOSE 156*  --  116* 122*  BUN 24*  --  21 23  CREATININE 1.04*  --  1.29* 1.09*  CALCIUM 8.7*  --  8.9 9.2  MG  --   --  2.3 2.6*   < > = values in this interval not displayed.   GFR Estimated Creatinine Clearance: 35.7 mL/min (A) (by C-G formula based on SCr of 1.09 mg/dL (H)). Liver Function Tests: No results for input(s): AST, ALT, ALKPHOS, BILITOT, PROT, ALBUMIN in the last 168 hours. No results for input(s): LIPASE, AMYLASE in the last 168 hours. No results for input(s): AMMONIA in the last 168 hours. Coagulation  profile No results for input(s): INR, PROTIME in the last 168 hours.  CBC: Recent Labs  Lab 11/01/19 1246  WBC 4.2  HGB 11.5*  HCT 32.9*  MCV 88.7  PLT 59*   Cardiac Enzymes: No results for input(s): CKTOTAL, CKMB, CKMBINDEX, TROPONINI in the last 168 hours. BNP: Invalid input(s): POCBNP CBG: Recent Labs  Lab 11/03/19 1231 11/03/19 1541 11/03/19 2157 11/04/19 0723 11/04/19 1150  GLUCAP 178* 126* 150* 119* 194*   D-Dimer No results for input(s): DDIMER in the last 72 hours. Hgb A1c Recent Labs    11/02/19 0703  HGBA1C 5.7*   Lipid Profile No results for input(s): CHOL, HDL, LDLCALC, TRIG, CHOLHDL, LDLDIRECT in the last 72 hours. Thyroid function studies No results for input(s): TSH, T4TOTAL, T3FREE, THYROIDAB in the last 72 hours.  Invalid input(s): FREET3 Anemia work up No results for input(s): VITAMINB12, FOLATE, FERRITIN, TIBC, IRON, RETICCTPCT in the last 72 hours. Microbiology Recent Results (from the past 240 hour(s))  SARS Coronavirus 2 by RT PCR (hospital order, performed in [facility] hospital lab) Nasopharyngeal Nasopharyngeal Swab     Status: None   Collection Time: 11/02/19 12:16 AM   Specimen: Nasopharyngeal Swab  Result Value Ref Range Status   SARS Coronavirus 2 NEGATIVE NEGATIVE Final    Comment: (NOTE) SARS-CoV-2 target nucleic acids are NOT DETECTED.  The SARS-CoV-2 RNA is generally detectable in upper and lower respiratory specimens during the acute phase of infection. The lowest concentration of SARS-CoV-2 viral copies this assay can detect is 250 copies / mL. A negative result does not preclude SARS-CoV-2 infection and should not be used as the sole basis for treatment or other patient management decisions.  A negative result may occur with improper specimen collection / handling, submission of specimen other than nasopharyngeal swab, presence of viral mutation(s) within the areas targeted by this assay, and inadequate number of viral  copies (<250 copies / mL). A negative result must be combined with clinical observations, patient history, and epidemiological information.  Fact Sheet for Patients:   StrictlyIdeas.no  Fact Sheet for Healthcare Providers: BankingDealers.co.za  This test is not yet approved or  cleared by the [place] FDA and has been authorized for detection and/or diagnosis of SARS-CoV-2 by FDA under an Emergency Use Authorization (EUA).  This EUA will remain in effect (meaning this test can be used) for the duration of the COVID-19 declaration under Section 564(b)(1) of the Act, 21 U.S.C. section 360bbb-3(b)(1), unless the authorization is terminated or revoked sooner.  Performed at [facility], [address] [place], [place] [address]      Discharge Instructions:   Discharge Instructions    (HEART FAILURE PATIENTS) Call MD:  Anytime you have any of the following symptoms: 1) 3 pound weight gain in 24 hours or 5 pounds in 1 week 2) shortness of breath, with or without a dry hacking cough 3) swelling in the hands, feet or stomach 4) if you have to sleep on extra pillows at night in order to breathe.   Complete by: As directed    AMB referral to CHF clinic   Complete by: As directed    Diet - low sodium heart healthy   Complete by: As directed    Diet Carb Modified   Complete by: As directed    Heart Failure patients record your daily weight using the same scale at the same time of day   Complete by: As directed    Increase activity slowly   Complete by: As directed    STOP any activity that causes chest pain, shortness of breath, dizziness, sweating, or exessive weakness   Complete by: As directed    Schedule appointment   Complete by: As directed    Follow-up with PCP in 1 week     Allergies as of 11/04/2019      Reactions   Morphine And Related Nausea And Vomiting   Other reaction(s): Nausea And Vomiting   Adhesive  [tape] Rash   Other Rash   NEOPRENE, TAPE, BAND-AID TOUGH STRIPS      Medication List    STOP taking these medications   cefdinir 300 MG capsule Commonly known as: OMNICEF   sacubitril-valsartan 24-26 MG Commonly known as: ENTRESTO     TAKE these medications   alendronate 70 MG tablet Commonly known as: FOSAMAX Take 70 mg by mouth every Monday. Take with a full glass of water on an empty stomach.   aspirin EC 81 MG tablet Take 81 mg by mouth daily.   benzonatate 100 MG capsule Commonly known as: Tessalon Perles Take 1 capsule (100 mg total) by mouth every 6 (six) hours as needed for cough.   blood glucose meter kit and supplies Kit Dispense based on patient and insurance preference. Use up to four times daily as directed. (FOR ICD-9 250.00, 250.01).   calcium-vitamin D 500-200 MG-UNIT tablet Take 1 tablet by mouth 2 (two) times daily.   Cranberry-Milk Thistle 250-75 MG Caps Take 1 capsule by mouth 3 (three) times daily.   ferrous sulfate 325 (65 FE) MG tablet Take 325 mg by mouth daily.   gabapentin 100 MG capsule Commonly known as: NEURONTIN Take 200 mg by mouth at bedtime.   Icy Hot Advanced Pain Relief 16-11 % Crea Generic drug: Menthol-Camphor Apply 1 application topically as directed.   levothyroxine 50 MCG tablet Commonly known as: SYNTHROID Take 50 mcg by mouth daily before breakfast.   meclizine 12.5 MG tablet Commonly known as: ANTIVERT Take 12.5 mg by mouth 3 (three) times daily as needed for dizziness.   Melatonin 10 MG Caps Take 10 mg by mouth at bedtime.   metFORMIN 500 MG tablet Commonly known as: GLUCOPHAGE Take 1,000 mg by mouth daily. In evening   metoprolol tartrate 25 MG tablet Commonly known as: LOPRESSOR Take 25 mg by mouth in the morning and at bedtime.   omeprazole 20 MG capsule Commonly known as: PRILOSEC Take 20 mg by mouth daily.   potassium chloride SA 20 MEQ tablet Commonly known as: KLOR-CON Take 1 tablet (20  mEq  total) by mouth 2 (two) times daily.   simvastatin 20 MG tablet Commonly known as: ZOCOR Take 20 mg by mouth at bedtime.   torsemide 20 MG tablet Commonly known as: DEMADEX Take 20 mg by mouth 2 (two) times daily.   Xifaxan 550 MG Tabs tablet Generic drug: rifaximin Take 550 mg by mouth 2 (two) times daily.       Follow-up Information    [facility] Follow up on 12/10/2019.   Specialty: Cardiology Why: at 11:00am. Enter through the [place] entrance Contact information: [address] [place] [phone]               Time coordinating discharge: 35 minutes  Signed:  Jennye Boroughs  [facility] 11/04/2019, 4:18 PM

## 2019-11-04 NOTE — Progress Notes (Signed)
Discharge instructions reviewed with patient. Opportunity for questions made available. Pt verbalized understanding of discharge instructions. IV & telemetry removed per protocol for d/c home. Pt's husband to transport pt home.

## 2019-11-05 LAB — CYTOLOGY - NON PAP

## 2019-11-05 LAB — PROTEIN, BODY FLUID (OTHER): Total Protein, Body Fluid Other: 1.3 g/dL

## 2019-11-07 ENCOUNTER — Telehealth: Payer: Self-pay | Admitting: Family

## 2019-11-07 NOTE — Telephone Encounter (Signed)
Spoke to patient who said she is doing ok since her hospital discharge. She is checking her weight daily, following a low sodium diet, and taking medications as prescribed with no complaints at this time. She confirmed her follow up [facility] appointment for 9/14.   Anai Lipson, NT

## 2019-11-22 ENCOUNTER — Other Ambulatory Visit: Payer: Self-pay

## 2019-11-22 ENCOUNTER — Other Ambulatory Visit: Payer: Self-pay | Admitting: Internal Medicine

## 2019-11-22 ENCOUNTER — Other Ambulatory Visit
Admission: RE | Admit: 2019-11-22 | Discharge: 2019-11-22 | Disposition: A | Discharge status: Home / Self Care | Payer: Medicare Other | Source: Ambulatory Visit | Attending: Internal Medicine | Admitting: Internal Medicine

## 2019-11-22 ENCOUNTER — Ambulatory Visit
Admission: RE | Admit: 2019-11-22 | Discharge: 2019-11-22 | Disposition: A | Discharge status: Home / Self Care | Payer: Medicare Other | Source: Ambulatory Visit | Attending: Internal Medicine | Admitting: Internal Medicine

## 2019-11-22 ENCOUNTER — Ambulatory Visit
Admission: RE | Admit: 2019-11-22 | Discharge: 2019-11-22 | Disposition: A | Discharge status: Home / Self Care | Payer: Medicare Other | Source: Ambulatory Visit | Attending: Physician Assistant | Admitting: Physician Assistant

## 2019-11-22 DIAGNOSIS — J90 Pleural effusion, not elsewhere classified: Secondary | ICD-10-CM

## 2019-11-22 DIAGNOSIS — R140 Abdominal distension (gaseous): Secondary | ICD-10-CM

## 2019-11-22 DIAGNOSIS — J984 Other disorders of lung: Secondary | ICD-10-CM | POA: Diagnosis not present

## 2019-11-22 DIAGNOSIS — Z20822 Contact with and (suspected) exposure to covid-19: Secondary | ICD-10-CM | POA: Insufficient documentation

## 2019-11-22 DIAGNOSIS — I700 Atherosclerosis of aorta: Secondary | ICD-10-CM | POA: Diagnosis not present

## 2019-11-22 DIAGNOSIS — Z01812 Encounter for preprocedural laboratory examination: Secondary | ICD-10-CM | POA: Insufficient documentation

## 2019-11-22 LAB — PROTEIN, PLEURAL OR PERITONEAL FLUID: Total protein, fluid: <3 g/dL

## 2019-11-22 LAB — AMYLASE, PLEURAL OR PERITONEAL FLUID: Amylase, Fluid: 15 U/L

## 2019-11-22 LAB — GLUCOSE, PLEURAL OR PERITONEAL FLUID: Glucose, Fluid: 163 mg/dL

## 2019-11-22 LAB — SARS CORONAVIRUS 2 BY RT PCR (HOSPITAL ORDER, PERFORMED IN ~~LOC~~ HOSPITAL LAB): SARS Coronavirus 2: NEGATIVE

## 2019-11-22 NOTE — Procedures (Signed)
PROCEDURE SUMMARY:  Successful image-guided right thoracentesis. Yielded 1.2 liters of hazy yellow fluid. Patient tolerated procedure well. EBL: Zero No immediate complications.  Specimen was sent for labs. Post procedure CXR shows no pneumothorax.  Please see imaging section of Epic for full dictation.  Joaquim Nam PA-C 11/22/2019 1:28 PM

## 2019-11-23 LAB — PROTEIN, BODY FLUID (OTHER): Total Protein, Body Fluid Other: 1 g/dL

## 2019-11-25 LAB — BODY FLUID CULTURE: Culture: NO GROWTH

## 2019-11-27 LAB — CYTOLOGY - NON PAP

## 2019-12-09 NOTE — Progress Notes (Signed)
Patient ID: Theresa [name], female    DOB: [birth year], 79 y.o.   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   Echo report from 09/26/19 reviewed and showed an EF of >75% along with moderately elevated PA pressure, mild LAE and mild/moderate MR/TR.   Admitted 11/01/19 due to acute on chronic HF. Initially given IV lasix with transition to oral diuretics. Had right-sided thoracentesis done with removal of 1.2L. Entresto held due to hypotension. Discharged after 3 days. Admitted 09/25/19 due to acute on chronic HF. Cardiology consult obtained. Initially given IV lasix with transition to oral diuretics. Unable to anticoagulate (for AF) due to chronic thrombocytopenia. Discharged after 2 days.   She presents today for a follow-up visit with a chief complaint of minimal shortness of breath upon moderate exertion. She describes this as chronic in nature having been present for several years. Was much worse last week with PCP recommendation of going to the ED which patient did not do. Symptoms improved and she feels better from last week. She has associated fatigue, intermittent chest tightness, cough, pedal edema, abdominal distention and easy bruising along with this. She denies any difficulty sleeping (has slept well the last 2 nights), dizziness, palpitations, chest pain or weight gain.   Leaving later today to go to her daughter's house in [place] to spend the night and then go to [facility] for a few days to visit family.   Past Medical History:  Diagnosis Date  . Anemia   . Arthritis   . Asthma   . Bradycardia   . CAD (coronary artery disease)    BIL.CAROTID ARTERY STENOSIS  . CHF (congestive heart failure) ([place])   . Chronic low back pain   . Cirrhosis of liver not due to alcohol ([place])   . Colon polyps   . Diabetes  mellitus without complication ([place])   . Dyspnea   . Esophageal varices without bleeding ([place])   . GERD (gastroesophageal reflux disease)   . Hip pain   . Hyperlipidemia   . Hypertension   . IDA (iron deficiency anemia)   . 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 Sleep apnea   . SOB (shortness of breath)   . Stroke ([place])   . Temporal arteritis ([place])   . Thrombocytopenia ([place])   . Thrombocytopenia ([place])    Past Surgical History:  Procedure Laterality Date  . COLONOSCOPY  10/24/2013, 2002   hyperplastic polpys  . CORONARY ANGIOPLASTY    . CORONARY ARTERY BYPASS GRAFT  09/1998  . ESOPHAGOGASTRODUODENOSCOPY (EGD) WITH PROPOFOL N/A 08/26/2016   Procedure: ESOPHAGOGASTRODUODENOSCOPY (EGD) WITH PROPOFOL;  Surgeon: Manya Silvas, MD;  Location[facility] ENDOSCOPY;  Service: Endoscopy;  Laterality: N/A;  . ESOPHAGOGASTRODUODENOSCOPY (EGD) WITH PROPOFOL N/A 02/05/2018   Procedure: ESOPHAGOGASTRODUODENOSCOPY (EGD) WITH PROPOFOL;  Surgeon: Manya Silvas, MD;  Location: [facility] ENDOSCOPY;  Service: Endoscopy;  Laterality: N/A;  . ESOPHAGOGASTRODUODENOSCOPY (EGD) WITH PROPOFOL N/A 07/22/2019   Procedure: ESOPHAGOGASTRODUODENOSCOPY (EGD) WITH PROPOFOL;  Surgeon: [place], Benay Pike, MD;  Location: [facility] ENDOSCOPY;  Service: Gastroenterology;  Laterality: N/A;  . ESOPHAGOGASTRODUODENOSCOPY ENDOSCOPY  05/24/2011  . hemorroid surgery    . JOINT REPLACEMENT  2003  2005   [email or web address]   Family History  Problem Relation Age of Onset  . Heart disease Other   . Hypertension Other   .  Anemia Other   . Colon cancer Other   . CVA Mother   . Dementia Mother   . CAD Father   . Breast cancer Neg Hx    Social History   Tobacco Use  . Smoking status: Never Smoker  . Smokeless tobacco: Never Used  Substance Use Topics  . Alcohol use: No    Alcohol/week: 0.0 standard drinks   Allergies  Allergen Reactions  . Morphine And Related Nausea And Vomiting    Other reaction(s): Nausea And  Vomiting  . Adhesive [Tape] Rash  . Other Rash    NEOPRENE, TAPE, BAND-AID TOUGH STRIPS   Prior to Admission medications   Medication Sig Start Date End Date Taking? Authorizing Provider  alendronate (FOSAMAX) 70 MG tablet Take 70 mg by mouth every Monday. Take with a full glass of water on an empty stomach.   Yes [provider]  aspirin EC 81 MG tablet Take 81 mg by mouth daily.   Yes [provider]  benzonatate (TESSALON PERLES) 100 MG capsule Take 1 capsule (100 mg total) by mouth every 6 (six) hours as needed for cough. 09/27/19 09/26/20 Yes Sharen Hones, MD  blood glucose meter kit and supplies KIT Dispense based on patient and insurance preference. Use up to four times daily as directed. (FOR ICD-9 250.00, 250.01). 04/24/16  Yes Gladstone Lighter, MD  Calcium Carbonate-Vitamin D (CALCIUM-VITAMIN D) 500-200 MG-UNIT tablet Take 1 tablet by mouth 2 (two) times daily.    Yes [provider]  Cranberry-Milk Thistle 250-75 MG CAPS Take 1 capsule by mouth 3 (three) times daily.    Yes [provider]  ferrous sulfate 325 (65 FE) MG tablet Take 325 mg by mouth daily.    Yes [provider]  gabapentin (NEURONTIN) 100 MG capsule Take 200 mg by mouth at bedtime.  01/26/15  Yes [provider]  levothyroxine (SYNTHROID, LEVOTHROID) 50 MCG tablet Take 50 mcg by mouth daily before breakfast.  03/31/15  Yes [provider]  meclizine (ANTIVERT) 12.5 MG tablet Take 12.5 mg by mouth 3 (three) times daily as needed for dizziness.   Yes [provider]  Melatonin 10 MG CAPS Take 10 mg by mouth at bedtime.    Yes [provider]  Menthol-Camphor (ICY HOT ADVANCED PAIN RELIEF) 16-11 % CREA Apply 1 application topically as directed.   Yes [provider]  metFORMIN (GLUCOPHAGE) 500 MG tablet Take 1,000 mg by mouth daily. In evening   Yes [provider]  metoprolol tartrate (LOPRESSOR) 25 MG tablet Take 25 mg by mouth  in the morning and at bedtime. 07/22/19  Yes [provider]  omeprazole (PRILOSEC) 20 MG capsule Take 20 mg by mouth daily.  03/24/15  Yes [provider]  potassium chloride SA (K-DUR,KLOR-CON) 20 MEQ tablet Take 1 tablet (20 mEq total) by mouth 2 (two) times daily. 11/22/16  Yes Cammie Sickle, MD  rifaximin (XIFAXAN) 550 MG TABS tablet Take 550 mg by mouth 2 (two) times daily.  01/20/15  Yes [provider]  simvastatin (ZOCOR) 20 MG tablet Take 20 mg by mouth at bedtime.  03/24/15  Yes [provider]  torsemide (DEMADEX) 20 MG tablet Take 20 mg by mouth in the morning, at noon, and at bedtime.    Yes [provider]    Review of Systems  Constitutional: Positive for fatigue. Negative for appetite change.  HENT: Positive for congestion and rhinorrhea. Negative for sore throat.   Eyes: Negative.  Respiratory: Positive for cough (productive cough), chest tightness (intermittent heaviness on right upper chest at night) and shortness of breath (easily).   Cardiovascular: Positive for leg swelling. Negative for chest pain and palpitations.  Gastrointestinal: Positive for abdominal distention (stable). Negative for abdominal pain.  Endocrine: Negative.   Genitourinary: Negative.   Musculoskeletal: Positive for arthralgias (shoulder pain). Negative for neck pain.  Skin: Negative.   Allergic/Immunologic: Negative.   Neurological: Negative for dizziness and light-headedness.  Hematological: Negative for adenopathy. Bruises/bleeds easily.  Psychiatric/Behavioral: Negative for dysphoric mood and sleep disturbance (slept well last 2 nights; sleeping on wedge and pillows). The patient is not nervous/anxious.    Vitals:   12/10/19 0845  BP: 132/64  Pulse: 78  Resp: 16  SpO2: 99%  Weight: 162 lb (73.5 kg)  Height: _0  (1.499 m)   Wt Readings from Last 3 Encounters:  12/10/19 162 lb (73.5 kg)  11/04/19 157 lb 12.8 oz (71.6 kg)  11/01/19  159 lb 4 oz (72.2 kg)   Lab Results  Component Value Date   CREATININE 1.09 (H) 11/04/2019   CREATININE 1.29 (H) 11/03/2019   CREATININE 1.04 (H) 11/01/2019    Physical Exam Vitals and nursing note reviewed.  Constitutional:      Appearance: Normal appearance.  HENT:     Head: Normocephalic and atraumatic.  Cardiovascular:     Rate and Rhythm: Normal rate and regular rhythm.  Pulmonary:     Effort: Pulmonary effort is normal.     Breath sounds: No wheezing, rhonchi or rales.  Abdominal:     General: There is distension.     Tenderness: There is no abdominal tenderness.  Musculoskeletal:        General: No tenderness.     Cervical back: Normal range of motion and neck supple.     Right lower leg: Edema (1+ pitting) present.     Left lower leg: Edema (1+ pitting) present.  Skin:    General: Skin is warm and dry.  Neurological:     General: No focal deficit present.     Mental Status: She is alert and oriented to person, place, and time.  Psychiatric:        Mood and Affect: Mood normal.        Behavior: Behavior normal.        Thought Content: Thought content normal.     Assessment & Plan:  1: Chronic heart failure with preserved ejection fraction along with structural changes (LAE)- - NYHA class III - stable pedal edema/ abdominal distention - weighing daily; reminded to call for an overnight weight gain of >2 pounds or a weekly weight gain of >5 pounds - weight up 3 pounds from last visit here 1 month ago - not adding salt and trying to follow a low sodium diet - saw cardiology Nehemiah Massed) 11/20/19 - had repeat right-sided thoracentesis done on 11/22/19 with removal of 1.2 L - BNP 09/25/19 was 221.8 - has received both COVID vaccines - consider adding spironolactone upon her return from [facility] - offered IV lasix today before she leaves but she declines; will elevate her legs as soon as they get there as well as make frequent stops along the way  2: HTN- - BP looks  good today - saw PCP ([facility]) 11/13/19 - BMP 11/11/19 reviewed and showed sodium 142, potassium 3.8, creatinine 1.2 and GFR 43  3: DM- - A1c 11/11/19 was  5.7% - glucose at home today was 84  4: Lymphedema- -  stage 2 - unable to exercise due to symptoms - does elevate her legs but doesn't think her edema improves with that - now wearing compression socks daily with some improvement of swelling - consider lymphapress compression boots if edema persists  5: Cirrhosis- - saw [facility] 10/30/19   Medication list was reviewed.   Return in 2 weeks or sooner for any questions/problems before then.

## 2019-12-10 ENCOUNTER — Other Ambulatory Visit: Payer: Self-pay

## 2019-12-10 ENCOUNTER — Ambulatory Visit: Payer: Medicare Other | Admitting: Family

## 2019-12-10 ENCOUNTER — Encounter: Payer: Self-pay | Admitting: Family

## 2019-12-10 ENCOUNTER — Ambulatory Visit: Payer: Medicare Other | Attending: Family | Admitting: Family

## 2019-12-10 VITALS — BP 132/64 | HR 78 | Resp 16 | Ht 59.0 in | Wt 162.0 lb

## 2019-12-10 DIAGNOSIS — E119 Type 2 diabetes mellitus without complications: Secondary | ICD-10-CM | POA: Insufficient documentation

## 2019-12-10 DIAGNOSIS — I110 Hypertensive heart disease with heart failure: Secondary | ICD-10-CM | POA: Diagnosis not present

## 2019-12-10 DIAGNOSIS — E785 Hyperlipidemia, unspecified: Secondary | ICD-10-CM | POA: Diagnosis not present

## 2019-12-10 DIAGNOSIS — E1142 Type 2 diabetes mellitus with diabetic polyneuropathy: Secondary | ICD-10-CM

## 2019-12-10 DIAGNOSIS — I2510 Atherosclerotic heart disease of native coronary artery without angina pectoris: Secondary | ICD-10-CM | POA: Insufficient documentation

## 2019-12-10 DIAGNOSIS — R05 Cough: Secondary | ICD-10-CM | POA: Diagnosis not present

## 2019-12-10 DIAGNOSIS — I890 Lymphedema, not elsewhere classified: Secondary | ICD-10-CM | POA: Insufficient documentation

## 2019-12-10 DIAGNOSIS — R0602 Shortness of breath: Secondary | ICD-10-CM | POA: Insufficient documentation

## 2019-12-10 DIAGNOSIS — Z7982 Long term (current) use of aspirin: Secondary | ICD-10-CM | POA: Diagnosis not present

## 2019-12-10 DIAGNOSIS — R140 Abdominal distension (gaseous): Secondary | ICD-10-CM | POA: Diagnosis not present

## 2019-12-10 DIAGNOSIS — R0789 Other chest pain: Secondary | ICD-10-CM | POA: Insufficient documentation

## 2019-12-10 DIAGNOSIS — I5032 Chronic diastolic (congestive) heart failure: Secondary | ICD-10-CM | POA: Insufficient documentation

## 2019-12-10 DIAGNOSIS — K219 Gastro-esophageal reflux disease without esophagitis: Secondary | ICD-10-CM | POA: Insufficient documentation

## 2019-12-10 DIAGNOSIS — Z951 Presence of aortocoronary bypass graft: Secondary | ICD-10-CM | POA: Diagnosis not present

## 2019-12-10 DIAGNOSIS — K7469 Other cirrhosis of liver: Secondary | ICD-10-CM

## 2019-12-10 DIAGNOSIS — K7460 Unspecified cirrhosis of liver: Secondary | ICD-10-CM | POA: Insufficient documentation

## 2019-12-10 DIAGNOSIS — Z7984 Long term (current) use of oral hypoglycemic drugs: Secondary | ICD-10-CM | POA: Diagnosis not present

## 2019-12-10 DIAGNOSIS — Z79899 Other long term (current) drug therapy: Secondary | ICD-10-CM | POA: Diagnosis not present

## 2019-12-10 DIAGNOSIS — I10 Essential (primary) hypertension: Secondary | ICD-10-CM

## 2019-12-10 NOTE — Patient Instructions (Addendum)
Continue weighing daily and call for an overnight weight gain of > 2 pounds or a weekly weight gain of >5 pounds.   Consider adding spironolactone

## 2019-12-19 ENCOUNTER — Other Ambulatory Visit
Admission: RE | Admit: 2019-12-19 | Discharge: 2019-12-19 | Disposition: A | Discharge status: Home / Self Care | Payer: Medicare Other | Source: Ambulatory Visit | Attending: Internal Medicine | Admitting: Internal Medicine

## 2019-12-19 ENCOUNTER — Other Ambulatory Visit: Payer: Self-pay | Admitting: Internal Medicine

## 2019-12-19 ENCOUNTER — Other Ambulatory Visit: Payer: Self-pay

## 2019-12-19 DIAGNOSIS — Z20822 Contact with and (suspected) exposure to covid-19: Secondary | ICD-10-CM | POA: Diagnosis present

## 2019-12-19 DIAGNOSIS — R0602 Shortness of breath: Secondary | ICD-10-CM

## 2019-12-19 DIAGNOSIS — I5033 Acute on chronic diastolic (congestive) heart failure: Secondary | ICD-10-CM

## 2019-12-19 DIAGNOSIS — J90 Pleural effusion, not elsewhere classified: Secondary | ICD-10-CM

## 2019-12-20 ENCOUNTER — Ambulatory Visit
Admission: RE | Admit: 2019-12-20 | Discharge: 2019-12-20 | Disposition: A | Discharge status: Home / Self Care | Payer: Medicare Other | Source: Ambulatory Visit | Attending: Internal Medicine | Admitting: Internal Medicine

## 2019-12-20 ENCOUNTER — Ambulatory Visit
Admission: RE | Admit: 2019-12-20 | Discharge: 2019-12-20 | Disposition: A | Discharge status: Home / Self Care | Payer: Medicare Other | Source: Ambulatory Visit | Attending: Radiology | Admitting: Radiology

## 2019-12-20 DIAGNOSIS — J90 Pleural effusion, not elsewhere classified: Secondary | ICD-10-CM | POA: Insufficient documentation

## 2019-12-20 DIAGNOSIS — R0602 Shortness of breath: Secondary | ICD-10-CM

## 2019-12-20 DIAGNOSIS — I5033 Acute on chronic diastolic (congestive) heart failure: Secondary | ICD-10-CM

## 2019-12-20 LAB — SARS CORONAVIRUS 2 (TAT 6-24 HRS): SARS Coronavirus 2: NEGATIVE

## 2019-12-20 NOTE — Procedures (Signed)
PROCEDURE SUMMARY:  Successful US guided right thoracentesis. Yielded 900 mL of slightly hazy yellow fluid. Pt tolerated procedure well. No immediate complications.  Specimen was not sent for labs. CXR ordered.  EBL < 5 mL  Ascencion Dike PA-C 12/20/2019 2:45 PM

## 2019-12-24 ENCOUNTER — Encounter: Payer: Self-pay | Admitting: Family

## 2019-12-24 ENCOUNTER — Ambulatory Visit: Payer: Medicare Other | Attending: Family | Admitting: Family

## 2019-12-24 ENCOUNTER — Other Ambulatory Visit: Payer: Self-pay

## 2019-12-24 VITALS — BP 116/51 | HR 83 | Resp 18 | Ht 59.0 in | Wt 158.4 lb

## 2019-12-24 DIAGNOSIS — I890 Lymphedema, not elsewhere classified: Secondary | ICD-10-CM | POA: Insufficient documentation

## 2019-12-24 DIAGNOSIS — Z8673 Personal history of transient ischemic attack (TIA), and cerebral infarction without residual deficits: Secondary | ICD-10-CM | POA: Diagnosis not present

## 2019-12-24 DIAGNOSIS — K219 Gastro-esophageal reflux disease without esophagitis: Secondary | ICD-10-CM | POA: Diagnosis not present

## 2019-12-24 DIAGNOSIS — Z8249 Family history of ischemic heart disease and other diseases of the circulatory system: Secondary | ICD-10-CM | POA: Insufficient documentation

## 2019-12-24 DIAGNOSIS — M1990 Unspecified osteoarthritis, unspecified site: Secondary | ICD-10-CM | POA: Diagnosis not present

## 2019-12-24 DIAGNOSIS — I6529 Occlusion and stenosis of unspecified carotid artery: Secondary | ICD-10-CM | POA: Insufficient documentation

## 2019-12-24 DIAGNOSIS — J45909 Unspecified asthma, uncomplicated: Secondary | ICD-10-CM | POA: Insufficient documentation

## 2019-12-24 DIAGNOSIS — E119 Type 2 diabetes mellitus without complications: Secondary | ICD-10-CM | POA: Diagnosis not present

## 2019-12-24 DIAGNOSIS — G4730 Sleep apnea, unspecified: Secondary | ICD-10-CM | POA: Insufficient documentation

## 2019-12-24 DIAGNOSIS — K7469 Other cirrhosis of liver: Secondary | ICD-10-CM

## 2019-12-24 DIAGNOSIS — Z7984 Long term (current) use of oral hypoglycemic drugs: Secondary | ICD-10-CM | POA: Insufficient documentation

## 2019-12-24 DIAGNOSIS — Z7982 Long term (current) use of aspirin: Secondary | ICD-10-CM | POA: Insufficient documentation

## 2019-12-24 DIAGNOSIS — Z79899 Other long term (current) drug therapy: Secondary | ICD-10-CM | POA: Insufficient documentation

## 2019-12-24 DIAGNOSIS — Z951 Presence of aortocoronary bypass graft: Secondary | ICD-10-CM | POA: Insufficient documentation

## 2019-12-24 DIAGNOSIS — M316 Other giant cell arteritis: Secondary | ICD-10-CM | POA: Diagnosis not present

## 2019-12-24 DIAGNOSIS — I110 Hypertensive heart disease with heart failure: Secondary | ICD-10-CM | POA: Diagnosis not present

## 2019-12-24 DIAGNOSIS — Z888 Allergy status to other drugs, medicaments and biological substances status: Secondary | ICD-10-CM | POA: Diagnosis not present

## 2019-12-24 DIAGNOSIS — I10 Essential (primary) hypertension: Secondary | ICD-10-CM

## 2019-12-24 DIAGNOSIS — K7460 Unspecified cirrhosis of liver: Secondary | ICD-10-CM | POA: Diagnosis not present

## 2019-12-24 DIAGNOSIS — I5032 Chronic diastolic (congestive) heart failure: Secondary | ICD-10-CM | POA: Diagnosis not present

## 2019-12-24 DIAGNOSIS — Z885 Allergy status to narcotic agent status: Secondary | ICD-10-CM | POA: Diagnosis not present

## 2019-12-24 DIAGNOSIS — I2510 Atherosclerotic heart disease of native coronary artery without angina pectoris: Secondary | ICD-10-CM | POA: Insufficient documentation

## 2019-12-24 DIAGNOSIS — D696 Thrombocytopenia, unspecified: Secondary | ICD-10-CM | POA: Diagnosis not present

## 2019-12-24 DIAGNOSIS — E785 Hyperlipidemia, unspecified: Secondary | ICD-10-CM | POA: Diagnosis not present

## 2019-12-24 DIAGNOSIS — E1142 Type 2 diabetes mellitus with diabetic polyneuropathy: Secondary | ICD-10-CM

## 2019-12-24 NOTE — Patient Instructions (Addendum)
Biotene for dry mouth or sugar free hard candies for dry mouth  Continue to use the TED Hose  Follow up with [facility]   Follow up in about 3 months or earlier as needed.

## 2019-12-24 NOTE — Progress Notes (Signed)
Patient ID: Theresa [name], female    DOB: [birth year], 79 y.o.   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   Echo report from 09/26/19 reviewed and showed an EF of >75% along with moderately elevated PA pressure, mild LAE and mild/moderate MR/TR.   Admitted 11/01/19 due to acute on chronic HF. Initially given IV lasix with transition to oral diuretics. Had right-sided thoracentesis done with removal of 1.2L. Entresto held due to hypotension. Discharged after 3 days. Admitted 09/25/19 due to acute on chronic HF. Cardiology consult obtained. Initially given IV lasix with transition to oral diuretics. Unable to anticoagulate (for AF) due to chronic thrombocytopenia. Discharged after 2 days.   She presents today for a follow-up visit with a chief complaint of minimal shortness of breath upon moderate exertion. She describes this as chronic in nature having been present for several years. She reports she has been feeling better as she recently had a thoracentesis x4 days ago. Patient reports still having SOB and distention to abdomen but yesterday "felt better than she has all summer".   Patient reports she has not been cooking anymore and her daughter orders her and her spouse [employer]. Patient denies looking closely at salt content but reports she and her husband split a meal and she does not use salt, only Mrs. Dash for seasoning.     Past Medical History:  Diagnosis Date  . Anemia   . Arthritis   . Asthma   . Bradycardia   . CAD (coronary artery disease)    BIL.CAROTID ARTERY STENOSIS  . CHF (congestive heart failure) ([employer])   . Chronic low back pain   . Cirrhosis of liver not due to alcohol ([place])   . Colon polyps   . Diabetes mellitus without complication ([place])   . Dyspnea   . Esophageal varices  without bleeding ([place])   . GERD (gastroesophageal reflux disease)   . Hip pain   . Hyperlipidemia   . Hypertension   . IDA (iron deficiency anemia)   . 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 Sleep apnea   . SOB (shortness of breath)   . Stroke ([place])   . Temporal arteritis ([place])   . Thrombocytopenia ([place])   . Thrombocytopenia ([place])    Past Surgical History:  Procedure Laterality Date  . COLONOSCOPY  10/24/2013, 2002   hyperplastic polpys  . CORONARY ANGIOPLASTY    . CORONARY ARTERY BYPASS GRAFT  09/1998  . ESOPHAGOGASTRODUODENOSCOPY (EGD) WITH PROPOFOL N/A 08/26/2016   Procedure: ESOPHAGOGASTRODUODENOSCOPY (EGD) WITH PROPOFOL;  Surgeon: Manya Silvas, MD;  [facility];  Service: Endoscopy;  Laterality: N/A;  . ESOPHAGOGASTRODUODENOSCOPY (EGD) WITH PROPOFOL N/A 02/05/2018   Procedure: ESOPHAGOGASTRODUODENOSCOPY (EGD) WITH PROPOFOL;  Surgeon: Manya Silvas, MD;  Location: [facility] ENDOSCOPY;  Service: Endoscopy;  Laterality: N/A;  . ESOPHAGOGASTRODUODENOSCOPY (EGD) WITH PROPOFOL N/A 07/22/2019   Procedure: ESOPHAGOGASTRODUODENOSCOPY (EGD) WITH PROPOFOL;  Surgeon: Toledo, Benay Pike, MD;  Location: [facility] ENDOSCOPY;  Service: Gastroenterology;  Laterality: N/A;  . ESOPHAGOGASTRODUODENOSCOPY ENDOSCOPY  05/24/2011  . hemorroid surgery    . JOINT REPLACEMENT  2003  2005   bil.[email or web address]   Family History  Problem Relation Age of Onset  . Heart disease Other   . Hypertension Other   . Anemia Other   . Colon cancer  Other   . CVA Mother   . Dementia Mother   . CAD Father   . Breast cancer Neg Hx    Social History   Tobacco Use  . Smoking status: Never Smoker  . Smokeless tobacco: Never Used  Substance Use Topics  . Alcohol use: No    Alcohol/week: 0.0 standard drinks   Allergies  Allergen Reactions  . Morphine And Related Nausea And Vomiting    Other reaction(s): Nausea And Vomiting  . Adhesive [Tape] Rash  . Other Rash    NEOPRENE, TAPE,  BAND-AID TOUGH STRIPS   Prior to Admission medications   Medication Sig Start Date End Date Taking? Authorizing Provider  alendronate (FOSAMAX) 70 MG tablet Take 70 mg by mouth every Monday. Take with a full glass of water on an empty stomach.   Yes [provider]  aspirin EC 81 MG tablet Take 81 mg by mouth daily.   Yes [provider]  benzonatate (TESSALON PERLES) 100 MG capsule Take 1 capsule (100 mg total) by mouth every 6 (six) hours as needed for cough. 09/27/19 09/26/20 Yes Sharen Hones, MD  blood glucose meter kit and supplies KIT Dispense based on patient and insurance preference. Use up to four times daily as directed. (FOR ICD-9 250.00, 250.01). 04/24/16  Yes Gladstone Lighter, MD  Calcium Carbonate-Vitamin D (CALCIUM-VITAMIN D) 500-200 MG-UNIT tablet Take 1 tablet by mouth 2 (two) times daily.    Yes [provider]  Cranberry-Milk Thistle 250-75 MG CAPS Take 1 capsule by mouth 3 (three) times daily.    Yes [provider]  empagliflozin (JARDIANCE) 10 MG TABS tablet Take 10 mg by mouth daily.   Yes [provider]  ferrous sulfate 325 (65 FE) MG tablet Take 325 mg by mouth daily.    Yes [provider]  gabapentin (NEURONTIN) 100 MG capsule Take 200 mg by mouth at bedtime.  01/26/15  Yes [provider]  levothyroxine (SYNTHROID, LEVOTHROID) 50 MCG tablet Take 50 mcg by mouth daily before breakfast.  03/31/15  Yes [provider]  meclizine (ANTIVERT) 12.5 MG tablet Take 12.5 mg by mouth 3 (three) times daily as needed for dizziness.   Yes [provider]  Melatonin 10 MG CAPS Take 10 mg by mouth at bedtime.    Yes [provider]  Menthol-Camphor (ICY HOT ADVANCED PAIN RELIEF) 16-11 % CREA Apply 1 application topically as directed.   Yes [provider]  metoprolol tartrate (LOPRESSOR) 25 MG tablet Take 25 mg by mouth in the morning and at bedtime. 07/22/19  Yes [provider]   omeprazole (PRILOSEC) 20 MG capsule Take 20 mg by mouth daily.  03/24/15  Yes [provider]  potassium chloride SA (K-DUR,KLOR-CON) 20 MEQ tablet Take 1 tablet (20 mEq total) by mouth 2 (two) times daily. 11/22/16  Yes Cammie Sickle, MD  rifaximin (XIFAXAN) 550 MG TABS tablet Take 550 mg by mouth 2 (two) times daily.  01/20/15  Yes [provider]  simvastatin (ZOCOR) 20 MG tablet Take 20 mg by mouth at bedtime.  03/24/15  Yes [provider]  torsemide (DEMADEX) 20 MG tablet Take 20 mg by mouth in the morning, at noon, and at bedtime.    Yes [provider]      Review of Systems  Constitutional: Positive for fatigue. Negative for appetite change.  HENT: Positive for congestion and rhinorrhea. Negative for sore throat.   Eyes: Negative.   Respiratory: Positive for cough (  productive cough when trying to lay down) and shortness of breath. Negative for apnea, chest tightness (intermittent heaviness on right upper chest at night) and wheezing.   Cardiovascular: Positive for chest pain and leg swelling. Negative for palpitations.  Gastrointestinal: Positive for abdominal distention (stable). Negative for abdominal pain.  Endocrine: Negative.   Genitourinary: Negative.   Musculoskeletal: Positive for arthralgias (shoulder pain). Negative for neck pain.  Skin: Negative.   Allergic/Immunologic: Negative.   Neurological: Negative for dizziness and light-headedness.  Hematological: Negative for adenopathy. Bruises/bleeds easily.  Psychiatric/Behavioral: Negative for dysphoric mood and sleep disturbance (Trouble falling asleep, coughing when laying down, sleeping on 2 standard pillows and a neck pillow). The patient is not nervous/anxious.    Vitals:   12/24/19 1055  BP: (!) 116/51  Pulse: 83  Resp: 18  SpO2: 100%  Weight: 158 lb 6 oz (71.8 kg)  Height: 4' 11"  (1.499 m)   Wt Readings from Last 3 Encounters:  12/24/19 158 lb 6 oz (71.8 kg)   12/10/19 162 lb (73.5 kg)  11/04/19 157 lb 12.8 oz (71.6 kg)   Lab Results  Component Value Date   CREATININE 1.09 (H) 11/04/2019   CREATININE 1.29 (H) 11/03/2019   CREATININE 1.04 (H) 11/01/2019    Physical Exam Vitals and nursing note reviewed.  Constitutional:      Appearance: Normal appearance. She is not toxic-appearing or diaphoretic.  HENT:     Head: Normocephalic and atraumatic.  Cardiovascular:     Rate and Rhythm: Normal rate and regular rhythm.  Pulmonary:     Effort: Pulmonary effort is normal. No tachypnea or bradypnea.     Breath sounds: No decreased breath sounds, wheezing, rhonchi or rales.  Abdominal:     General: There is distension.     Palpations: Abdomen is soft.     Tenderness: There is abdominal tenderness (Patient reports she has tenderness where she tapped for her thoracentesis).  Musculoskeletal:        General: No tenderness.     Cervical back: Normal range of motion and neck supple.     Right lower leg: No edema (1+ pitting).     Left lower leg: No edema (1+ pitting).  Skin:    General: Skin is warm and dry.  Neurological:     General: No focal deficit present.     Mental Status: She is alert and oriented to person, place, and time.  Psychiatric:        Mood and Affect: Mood normal.        Behavior: Behavior normal.        Thought Content: Thought content normal.     Assessment & Plan:  1: Chronic heart failure with preserved ejection fraction along with structural changes (LAE)- - NYHA class III - stable pedal edema/ abdominal distention - weighing daily; reminded to call for an overnight weight gain of >2 pounds or a weekly weight gain of >5 pounds - not adding salt and trying to follow a low sodium diet, using salt substitute and daughter ordering fresh boxes. - saw cardiology Nehemiah Massed) 11/20/19 - had repeat right-sided thoracentesis done on 11/22/19 with removal of 1.2 L - BNP 11/01/19 was 127.5 - has received both COVID vaccines and  planning to get [place] booster and flu shot soon   2: HTN- - BP looks good today - saw PCP (Hande) 11/13/19 - BMP 11/11/19 reviewed and showed sodium 142, potassium 3.8, creatinine 1.2 and GFR 43  3: DM- - A1c 11/02/19 was  5.7% - glucose at home today was 91  4: Lymphedema- - stage 2 - unable to exercise due to symptoms -Consistently wearing compression socks with edema improvemtns  5: Cirrhosis- - saw [facility] 10/30/19   Medication list was reviewed and patient has been changed from Metformin to [place] by her PCP.  Return in 3 months or sooner if needed.

## 2019-12-30 ENCOUNTER — Other Ambulatory Visit: Payer: Self-pay

## 2019-12-30 DIAGNOSIS — J90 Pleural effusion, not elsewhere classified: Secondary | ICD-10-CM

## 2020-01-03 ENCOUNTER — Ambulatory Visit: Payer: Self-pay | Admitting: Cardiothoracic Surgery

## 2020-01-08 ENCOUNTER — Ambulatory Visit: Payer: Medicare Other | Attending: Internal Medicine

## 2020-01-08 DIAGNOSIS — R400 Somnolence: Secondary | ICD-10-CM | POA: Diagnosis present

## 2020-01-08 DIAGNOSIS — R0683 Snoring: Secondary | ICD-10-CM | POA: Diagnosis not present

## 2020-01-08 DIAGNOSIS — Z6831 Body mass index (BMI) 31.0-31.9, adult: Secondary | ICD-10-CM | POA: Insufficient documentation

## 2020-01-09 ENCOUNTER — Other Ambulatory Visit: Payer: Self-pay

## 2020-02-18 ENCOUNTER — Other Ambulatory Visit: Payer: Self-pay | Admitting: Physician Assistant

## 2020-02-18 ENCOUNTER — Other Ambulatory Visit: Payer: Self-pay

## 2020-02-18 ENCOUNTER — Ambulatory Visit
Admission: RE | Admit: 2020-02-18 | Discharge: 2020-02-18 | Disposition: A | Discharge status: Home / Self Care | Payer: Medicare Other | Source: Ambulatory Visit | Attending: Radiology | Admitting: Radiology

## 2020-02-18 ENCOUNTER — Other Ambulatory Visit
Admission: RE | Admit: 2020-02-18 | Discharge: 2020-02-18 | Disposition: A | Discharge status: Home / Self Care | Payer: Medicare Other | Source: Ambulatory Visit | Attending: Physician Assistant | Admitting: Physician Assistant

## 2020-02-18 ENCOUNTER — Ambulatory Visit
Admission: RE | Admit: 2020-02-18 | Discharge: 2020-02-18 | Disposition: A | Discharge status: Home / Self Care | Payer: Medicare Other | Source: Ambulatory Visit | Attending: Physician Assistant | Admitting: Physician Assistant

## 2020-02-18 ENCOUNTER — Other Ambulatory Visit: Payer: Self-pay | Admitting: Radiology

## 2020-02-18 DIAGNOSIS — J90 Pleural effusion, not elsewhere classified: Secondary | ICD-10-CM | POA: Diagnosis not present

## 2020-02-18 DIAGNOSIS — Z9889 Other specified postprocedural states: Secondary | ICD-10-CM

## 2020-02-18 DIAGNOSIS — K769 Liver disease, unspecified: Secondary | ICD-10-CM

## 2020-02-18 DIAGNOSIS — Z951 Presence of aortocoronary bypass graft: Secondary | ICD-10-CM | POA: Insufficient documentation

## 2020-02-18 DIAGNOSIS — K7460 Unspecified cirrhosis of liver: Secondary | ICD-10-CM

## 2020-02-18 DIAGNOSIS — Z20822 Contact with and (suspected) exposure to covid-19: Secondary | ICD-10-CM | POA: Insufficient documentation

## 2020-02-18 DIAGNOSIS — R0602 Shortness of breath: Secondary | ICD-10-CM | POA: Insufficient documentation

## 2020-02-18 DIAGNOSIS — J9811 Atelectasis: Secondary | ICD-10-CM | POA: Insufficient documentation

## 2020-02-18 DIAGNOSIS — Z01812 Encounter for preprocedural laboratory examination: Secondary | ICD-10-CM | POA: Insufficient documentation

## 2020-02-18 DIAGNOSIS — R188 Other ascites: Secondary | ICD-10-CM

## 2020-02-18 DIAGNOSIS — I517 Cardiomegaly: Secondary | ICD-10-CM | POA: Diagnosis not present

## 2020-02-18 DIAGNOSIS — J918 Pleural effusion in other conditions classified elsewhere: Secondary | ICD-10-CM

## 2020-02-18 LAB — SARS CORONAVIRUS 2 BY RT PCR (HOSPITAL ORDER, PERFORMED IN ~~LOC~~ HOSPITAL LAB): SARS Coronavirus 2: NEGATIVE

## 2020-02-18 NOTE — Procedures (Signed)
Ultrasound-guided therapeutic right thoracentesis performed yielding 720 cc of slightly hazy, yellow fluid. No immediate complications. Follow-up chest x-ray pending. EBL none.

## 2020-03-24 ENCOUNTER — Ambulatory Visit: Payer: Medicare Other | Admitting: Family

## 2020-03-24 NOTE — Progress Notes (Signed)
Patient ID: Theresa [name], female    DOB: [birth year], 79 y.o.   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   Echo report from 09/26/19 reviewed and showed an EF of >75% along with moderately elevated PA pressure, mild LAE and mild/moderate MR/TR.   Admitted 11/01/19 due to acute on chronic HF. Initially given IV lasix with transition to oral diuretics. Had right-sided thoracentesis done with removal of 1.2L. Entresto held due to hypotension. Discharged after 3 days. Admitted 09/25/19 due to acute on chronic HF. Cardiology consult obtained. Initially given IV lasix with transition to oral diuretics. Unable to anticoagulate (for AF) due to chronic thrombocytopenia. Discharged after 2 days.   She presents today for a follow-up visit with a chief complaint of moderate shortness of breath upon moderate exertion. She describes this as chronic in nature having been present for several years. She has associated fatigue, chest tightness, cough, pedal edema, abdominal distention, easy bruising and fluctuating weight along with this. She denies any difficulty sleeping, dizziness, palpitations, chest pain or wheezing.   Daughter that is present with her says that she's now taking metolazone 2.61m weekly along with torsemide 216mBID. She was previously taking 2042morsemide TID with the metolazone but then became dehydrated and had to begin drinking pedialyte / gatorade to replenish her fluids.    Past Medical History:  Diagnosis Date  . Anemia   . Arthritis   . Asthma   . Bradycardia   . CAD (coronary artery disease)    BIL.CAROTID ARTERY STENOSIS  . CHF (congestive heart failure) ([place] . Chronic low back pain   . Cirrhosis of liver not due to alcohol ([place] . Colon polyps   . Diabetes mellitus without complication  ([facility] . Dyspnea   . Esophageal varices without bleeding ([place] . GERD (gastroesophageal reflux disease)   . Hip pain   . Hyperlipidemia   . Hypertension   . IDA (iron deficiency anemia)   . 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 Sleep apnea   . SOB (shortness of breath)   . Stroke ([place] . Temporal arteritis ([facility] . Thrombocytopenia ([facility] . [place]ocytopenia ([place]  Past Surgical History:  Procedure Laterality Date  . COLONOSCOPY  10/24/2013, 2002   hyperplastic polpys  . CORONARY ANGIOPLASTY    . CORONARY ARTERY BYPASS GRAFT  09/1998  . ESOPHAGOGASTRODUODENOSCOPY (EGD) WITH PROPOFOL N/A 08/26/2016   Procedure: ESOPHAGOGASTRODUODENOSCOPY (EGD) WITH PROPOFOL;  Surgeon: EllManya SilvasD;  [facility];  Service: Endoscopy;  Laterality: N/A;  . ESOPHAGOGASTRODUODENOSCOPY (EGD) WITH PROPOFOL N/A 02/05/2018   Procedure: ESOPHAGOGASTRODUODENOSCOPY (EGD) WITH PROPOFOL;  Surgeon: EllManya SilvasD;  [facility];  Service: Endoscopy;  Laterality: N/A;  . ESOPHAGOGASTRODUODENOSCOPY (EGD) WITH PROPOFOL N/A 07/22/2019   Procedure: ESOPHAGOGASTRODUODENOSCOPY (EGD) WITH PROPOFOL;  Surgeon: Toledo, TeoBenay PikeD;  Location: [facility] ENDOSCOPY;  Service: Gastroenterology;  Laterality: N/A;  . ESOPHAGOGASTRODUODENOSCOPY ENDOSCOPY  05/24/2011  . hemorroid surgery    . JOINT REPLACEMENT  2003  2005   bil.knees   Family History  Problem Relation Age of Onset  . Heart disease Other   . Hypertension Other   . Anemia Other   . Colon cancer Other   . CVA Mother   .  Dementia Mother   . CAD Father   . Breast cancer Neg Hx    Social History   Tobacco Use  . Smoking status: Never Smoker  . Smokeless tobacco: Never Used  Substance Use Topics  . Alcohol use: No    Alcohol/week: 0.0 standard drinks   Allergies  Allergen Reactions  . Morphine And Related Nausea And Vomiting    Other reaction(s): Nausea And Vomiting  . Adhesive [Tape]  Rash  . Other Rash    NEOPRENE, TAPE, BAND-AID TOUGH STRIPS   Prior to Admission medications   Medication Sig Start Date End Date Taking? Authorizing Provider  alendronate (FOSAMAX) 70 MG tablet Take 70 mg by mouth every Monday. Take with a full glass of water on an empty stomach.   Yes [provider]  aspirin EC 81 MG tablet Take 81 mg by mouth daily.   Yes [provider]  blood glucose meter kit and supplies KIT Dispense based on patient and insurance preference. Use up to four times daily as directed. (FOR ICD-9 250.00, 250.01). 04/24/16  Yes Gladstone Lighter, MD  Calcium Carbonate-Vitamin D (CALCIUM-VITAMIN D) 500-200 MG-UNIT tablet Take 1 tablet by mouth 2 (two) times daily.    Yes [provider]  CINNAMON PO Take by mouth.   Yes [provider]  Cranberry-Milk Thistle 250-75 MG CAPS Take 1 capsule by mouth 3 (three) times daily.    Yes [provider]  dapagliflozin propanediol (FARXIGA) 10 MG TABS tablet Take 10 mg by mouth daily.   Yes [provider]  ferrous sulfate 325 (65 FE) MG tablet Take 325 mg by mouth daily.    Yes [provider]  gabapentin (NEURONTIN) 100 MG capsule Take 200 mg by mouth at bedtime.  01/26/15  Yes [provider]  levothyroxine (SYNTHROID, LEVOTHROID) 50 MCG tablet Take 50 mcg by mouth daily before breakfast.  03/31/15  Yes [provider]  Melatonin 10 MG CAPS Take 10 mg by mouth at bedtime.    Yes [provider]  Menthol-Camphor (ICY HOT ADVANCED PAIN RELIEF) 16-11 % CREA Apply 1 application topically as directed.   Yes [provider]  metFORMIN (GLUCOPHAGE) 500 MG tablet Take 500 mg by mouth 2 (two) times daily with a meal.   Yes [provider]  metolazone (ZAROXOLYN) 2.5 MG tablet Take 2.5 mg by mouth once a week.   Yes [provider]  metoprolol tartrate (LOPRESSOR) 25 MG tablet Take 25 mg by mouth in the morning and at bedtime.  07/22/19  Yes [provider]  omeprazole (PRILOSEC) 20 MG capsule Take 20 mg by mouth daily.  03/24/15  Yes [provider]  potassium chloride SA (K-DUR,KLOR-CON) 20 MEQ tablet Take 1 tablet (20 mEq total) by mouth 2 (two) times daily. 11/22/16  Yes Cammie Sickle, MD  rifaximin (XIFAXAN) 550 MG TABS tablet Take 550 mg by mouth 2 (two) times daily.  01/20/15  Yes [provider]  simvastatin (ZOCOR) 20 MG tablet Take 20 mg by mouth at bedtime.  03/24/15  Yes [provider]  torsemide (DEMADEX) 20 MG tablet Take 20 mg by mouth 2 (two) times daily.   Yes [provider]    Review of Systems  Constitutional: Positive for fatigue. Negative for appetite change.  HENT: Positive for congestion and rhinorrhea. Negative for sore throat.   Eyes: Negative.   Respiratory: Positive for cough (at times), chest tightness (intermittent heaviness on right upper chest at night) and  shortness of breath ("improving"). Negative for apnea and wheezing.   Cardiovascular: Positive for leg swelling. Negative for chest pain and palpitations.  Gastrointestinal: Positive for abdominal distention (stable). Negative for abdominal pain.  Endocrine: Negative.   Genitourinary: Negative.   Musculoskeletal: Positive for arthralgias (shoulder pain). Negative for neck pain.  Skin: Negative.   Allergic/Immunologic: Negative.   Neurological: Negative for dizziness and light-headedness.  Hematological: Negative for adenopathy. Bruises/bleeds easily.  Psychiatric/Behavioral: Negative for dysphoric mood and sleep disturbance (sleeping on 2 standard pillows and a neck pillow). The patient is not nervous/anxious.    Vitals:   03/25/20 1213  BP: 129/68  Pulse: (!) 54  Resp: 16  SpO2: 100%  Weight: 156 lb 6 oz (70.9 kg)  Height: 5' (1.524 m)   Wt Readings from Last 3 Encounters:  03/25/20 156 lb 6 oz (70.9 kg)  12/24/19 158 lb 6 oz (71.8 kg)  12/10/19 162 lb (73.5 kg)    Lab Results  Component Value Date   CREATININE 1.09 (H) 11/04/2019   CREATININE 1.29 (H) 11/03/2019   CREATININE 1.04 (H) 11/01/2019    Physical Exam Vitals and nursing note reviewed.  Constitutional:      Appearance: Normal appearance. She is not toxic-appearing or diaphoretic.  HENT:     Head: Normocephalic and atraumatic.  Cardiovascular:     Rate and Rhythm: Normal rate and regular rhythm.  Pulmonary:     Effort: Pulmonary effort is normal. No tachypnea or bradypnea.     Breath sounds: No decreased breath sounds, wheezing, rhonchi or rales.  Abdominal:     General: There is distension.     Palpations: Abdomen is soft.     Tenderness: There is no abdominal tenderness.  Musculoskeletal:        General: No tenderness.     Cervical back: Normal range of motion and neck supple.     Right lower leg: No edema (1+ pitting).     Left lower leg: No edema (1+ pitting).  Skin:    General: Skin is warm and dry.  Neurological:     General: No focal deficit present.     Mental Status: She is alert and oriented to person, place, and time.  Psychiatric:        Mood and Affect: Mood normal.        Behavior: Behavior normal.        Thought Content: Thought content normal.    Assessment & Plan:  1: Chronic heart failure with preserved ejection fraction along with structural changes (LAE)- - NYHA class III - stable pedal edema/ abdominal distention - weighing daily; reminded to call for an overnight weight gain of >2 pounds or a weekly weight gain of >5 pounds - weight down 2 pounds from last visit here 3 months ago - not adding salt and trying to follow a low sodium diet, using salt substitute  - saw cardiology Theresa [name]) 03/05/20 & returns February 2022 - had repeat right-sided thoracentesis done on 02/18/20 with removal of 720cc fluid - BNP 11/01/19 was 127.5 - has received all 3 [employer] covid vaccines - has received her flu vaccine for this season  2: HTN- - BP looks good here  today - saw PCP (Theresa [name]) 02/27/20 & returns tomorrow - BMP 03/10/20 reviewed and showed sodium 139, potassium 3.7, creatinine 1.6 and GFR 31 - will recheck BMP today  3: DM- - A1c 11/11/19 was  5.7% - glucose at home today was 107  4: Lymphedema- - stage 2 -  unable to exercise due to symptoms - Consistently wearing compression socks with edema improvemtns  5: Cirrhosis- - [name] 10/30/19 & returns in 2 weeks   Patient did not bring her medications nor a list. Each medication was verbally reviewed with the patient and she was encouraged to bring the bottles to every visit to confirm accuracy of list.  Return in 6 months or sooner for any questions/problems before then.

## 2020-03-25 ENCOUNTER — Other Ambulatory Visit: Payer: Self-pay

## 2020-03-25 ENCOUNTER — Ambulatory Visit: Payer: Medicare Other | Attending: Family | Admitting: Family

## 2020-03-25 ENCOUNTER — Encounter: Payer: Self-pay | Admitting: Family

## 2020-03-25 VITALS — BP 129/68 | HR 54 | Resp 16 | Ht 60.0 in | Wt 156.4 lb

## 2020-03-25 DIAGNOSIS — I110 Hypertensive heart disease with heart failure: Secondary | ICD-10-CM | POA: Diagnosis not present

## 2020-03-25 DIAGNOSIS — Z885 Allergy status to narcotic agent status: Secondary | ICD-10-CM | POA: Insufficient documentation

## 2020-03-25 DIAGNOSIS — Z7982 Long term (current) use of aspirin: Secondary | ICD-10-CM | POA: Insufficient documentation

## 2020-03-25 DIAGNOSIS — I890 Lymphedema, not elsewhere classified: Secondary | ICD-10-CM | POA: Insufficient documentation

## 2020-03-25 DIAGNOSIS — Z79899 Other long term (current) drug therapy: Secondary | ICD-10-CM | POA: Insufficient documentation

## 2020-03-25 DIAGNOSIS — I5032 Chronic diastolic (congestive) heart failure: Secondary | ICD-10-CM | POA: Insufficient documentation

## 2020-03-25 DIAGNOSIS — Z7984 Long term (current) use of oral hypoglycemic drugs: Secondary | ICD-10-CM | POA: Insufficient documentation

## 2020-03-25 DIAGNOSIS — K7460 Unspecified cirrhosis of liver: Secondary | ICD-10-CM | POA: Diagnosis not present

## 2020-03-25 DIAGNOSIS — R059 Cough, unspecified: Secondary | ICD-10-CM | POA: Diagnosis not present

## 2020-03-25 DIAGNOSIS — Z888 Allergy status to other drugs, medicaments and biological substances status: Secondary | ICD-10-CM | POA: Insufficient documentation

## 2020-03-25 DIAGNOSIS — Z951 Presence of aortocoronary bypass graft: Secondary | ICD-10-CM | POA: Diagnosis not present

## 2020-03-25 DIAGNOSIS — E119 Type 2 diabetes mellitus without complications: Secondary | ICD-10-CM | POA: Diagnosis not present

## 2020-03-25 DIAGNOSIS — R0602 Shortness of breath: Secondary | ICD-10-CM | POA: Diagnosis not present

## 2020-03-25 DIAGNOSIS — I10 Essential (primary) hypertension: Secondary | ICD-10-CM

## 2020-03-25 DIAGNOSIS — Z8673 Personal history of transient ischemic attack (TIA), and cerebral infarction without residual deficits: Secondary | ICD-10-CM | POA: Insufficient documentation

## 2020-03-25 DIAGNOSIS — R140 Abdominal distension (gaseous): Secondary | ICD-10-CM | POA: Diagnosis not present

## 2020-03-25 DIAGNOSIS — R0789 Other chest pain: Secondary | ICD-10-CM | POA: Diagnosis not present

## 2020-03-25 DIAGNOSIS — Z8249 Family history of ischemic heart disease and other diseases of the circulatory system: Secondary | ICD-10-CM | POA: Insufficient documentation

## 2020-03-25 DIAGNOSIS — R5383 Other fatigue: Secondary | ICD-10-CM | POA: Diagnosis not present

## 2020-03-25 DIAGNOSIS — K7469 Other cirrhosis of liver: Secondary | ICD-10-CM

## 2020-03-25 DIAGNOSIS — E1142 Type 2 diabetes mellitus with diabetic polyneuropathy: Secondary | ICD-10-CM

## 2020-03-25 LAB — BASIC METABOLIC PANEL
Anion gap: 12 (ref 5–15)
BUN: 35 mg/dL — ABNORMAL HIGH (ref 8–23)
CO2: 33 mmol/L — ABNORMAL HIGH (ref 22–32)
Calcium: 9.4 mg/dL (ref 8.9–10.3)
Chloride: 93 mmol/L — ABNORMAL LOW (ref 98–111)
Creatinine, Ser: 1.32 mg/dL — ABNORMAL HIGH (ref 0.44–1.00)
GFR, Estimated: 41 mL/min — ABNORMAL LOW (ref >60)
Glucose, Bld: 187 mg/dL — ABNORMAL HIGH (ref 70–99)
Potassium: 3.3 mmol/L — ABNORMAL LOW (ref 3.5–5.1)
Sodium: 138 mmol/L (ref 135–145)

## 2020-03-25 NOTE — Patient Instructions (Signed)
Continue weighing daily and call for an overnight weight gain of > 2 pounds or a weekly weight gain of >5 pounds. 

## 2020-03-26 ENCOUNTER — Telehealth: Payer: Self-pay | Admitting: Family

## 2020-03-26 NOTE — Telephone Encounter (Signed)
Spoke with daughter, Theresa [name], regarding patient's lab results obtained yesterday. Potassium is slightly low at 3.3. Theresa [name] says that she saw the results last night via mychart and gave her mom an extra potassium tablet.   Patient had seen PCP's office earlier today and metolazone will now be used PRN base on weight instead of weekly. Casimiro Needle that when patient takes the metolazone, she should take an additional 2 potassium tablets. Theresa [name] verbalized understanding.

## 2020-05-08 ENCOUNTER — Other Ambulatory Visit: Payer: Self-pay | Admitting: Internal Medicine

## 2020-05-08 ENCOUNTER — Other Ambulatory Visit (HOSPITAL_COMMUNITY): Payer: Self-pay | Admitting: Internal Medicine

## 2020-05-08 DIAGNOSIS — K7460 Unspecified cirrhosis of liver: Secondary | ICD-10-CM

## 2020-05-14 ENCOUNTER — Ambulatory Visit
Admission: RE | Admit: 2020-05-14 | Discharge: 2020-05-14 | Disposition: A | Discharge status: Home / Self Care | Payer: Medicare Other | Source: Ambulatory Visit | Attending: Internal Medicine | Admitting: Internal Medicine

## 2020-05-14 ENCOUNTER — Other Ambulatory Visit: Payer: Self-pay

## 2020-05-14 DIAGNOSIS — K7460 Unspecified cirrhosis of liver: Secondary | ICD-10-CM | POA: Insufficient documentation

## 2020-08-12 ENCOUNTER — Ambulatory Visit
Admission: RE | Admit: 2020-08-12 | Discharge: 2020-08-12 | Disposition: A | Discharge status: Home / Self Care | Payer: Medicare Other | Source: Ambulatory Visit | Attending: Internal Medicine | Admitting: Internal Medicine

## 2020-08-12 ENCOUNTER — Other Ambulatory Visit: Payer: Self-pay

## 2020-08-12 DIAGNOSIS — R609 Edema, unspecified: Secondary | ICD-10-CM | POA: Diagnosis not present

## 2020-08-12 MED ORDER — FUROSEMIDE 10 MG/ML IJ SOLN
INTRAMUSCULAR | Status: AC
  Administered 2020-08-12: 80 mg via INTRAVENOUS
  Filled 2020-08-12: qty 8

## 2020-08-12 MED ORDER — SODIUM CHLORIDE FLUSH 0.9 % IV SOLN
INTRAVENOUS | Status: AC
  Filled 2020-08-12: qty 10

## 2020-08-12 MED ORDER — FUROSEMIDE 10 MG/ML IJ SOLN: 80.0000 mg | Freq: Once | INTRAMUSCULAR | Status: AC

## 2020-09-19 NOTE — Progress Notes (Signed)
Patient ID: Theresa [name], female    DOB: [birth year], 80 y.o.   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   Echo report from 09/26/19 reviewed and showed an EF of >75% along with moderately elevated PA pressure, mild LAE and mild/moderate MR/TR.   Has not been admitted or been in the ED in the last 6 months.   Theresa [name] presents today for a follow-up visit with a chief complaint of moderate fatigue with little exertion. Theresa [name] describes this as chronic in nature having been present for several years. Theresa [name] has associated cough, abdominal distention, shortness of breath, shoulder pain and gradual weight gain along with this. Theresa [name] denies any difficulty sleeping, palpitations, chest pain or dizziness.   Theresa [name] says that Theresa [name] has been walking 1/3 mile ~ 4 times/ week. Overall Theresa [name] says that Theresa [name] feels "much better" over the last few weeks.   Normally wears TED hose but didn't put them on today.   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   Sleep apnea    SOB (shortness of breath)    Stroke ([place])    Temporal arteritis (HCC)    Thrombocytopenia (HCC)    Thrombocytopenia ([place])    Past Surgical History:  Procedure Laterality Date   COLONOSCOPY  10/24/2013, 2002   hyperplastic polpys   CORONARY  ANGIOPLASTY     CORONARY ARTERY BYPASS GRAFT  09/1998   ESOPHAGOGASTRODUODENOSCOPY (EGD) WITH PROPOFOL N/A 08/26/2016   Procedure: ESOPHAGOGASTRODUODENOSCOPY (EGD) WITH PROPOFOL;  Surgeon: Manya Silvas, MD;  Location: [facility] ENDOSCOPY;  Service: Endoscopy;  Laterality: N/A;   ESOPHAGOGASTRODUODENOSCOPY (EGD) WITH PROPOFOL N/A 02/05/2018   Procedure: ESOPHAGOGASTRODUODENOSCOPY (EGD) WITH PROPOFOL;  Surgeon: Manya Silvas, MD;  Location: [facility] ENDOSCOPY;  Service: Endoscopy;  Laterality: N/A;   ESOPHAGOGASTRODUODENOSCOPY (EGD) WITH PROPOFOL N/A 07/22/2019   Procedure: ESOPHAGOGASTRODUODENOSCOPY (EGD) WITH PROPOFOL;  Surgeon: Toledo, Benay Pike, MD;  Location: [facility] ENDOSCOPY;  Service: Gastroenterology;  Laterality: N/A;   ESOPHAGOGASTRODUODENOSCOPY ENDOSCOPY  05/24/2011   hemorroid surgery     JOINT REPLACEMENT  2003  2005   bil.knees   Family History  Problem Relation Age of Onset   Heart disease Other    Hypertension Other    Anemia Other    Colon cancer Other    CVA Mother    Dementia Mother    CAD Father    Breast cancer Neg Hx    Social History   Tobacco Use   Smoking status: Never   Smokeless tobacco: Never  Substance Use Topics   Alcohol use: No    Alcohol/week: 0.0 standard drinks   Allergies  Allergen Reactions   Morphine And Related Nausea And  Vomiting    Other reaction(s): Nausea And Vomiting   Adhesive [Tape] Rash   Other Rash    NEOPRENE, TAPE, BAND-AID TOUGH STRIPS   Prior to Admission medications   Medication Sig Start Date End Date Taking? Authorizing Provider  alendronate (FOSAMAX) 70 MG tablet Take 70 mg by mouth every Monday. Take with a full glass of water on an empty stomach.   Yes [provider]  blood glucose meter kit and supplies KIT Dispense based on patient and insurance preference. Use up to four times daily as directed. (FOR ICD-9 250.00, 250.01). 04/24/16  Yes Gladstone Lighter, MD  Calcium Carbonate-Vitamin D (CALCIUM-VITAMIN D)  500-200 MG-UNIT tablet Take 1 tablet by mouth 2 (two) times daily.    Yes [provider]  CINNAMON PO Take by mouth.   Yes [provider]  Cranberry-Milk Thistle 250-75 MG CAPS Take 1 capsule by mouth 3 (three) times daily.    Yes [provider]  dapagliflozin propanediol (FARXIGA) 10 MG TABS tablet Take 10 mg by mouth daily.   Yes [provider]  ferrous sulfate 325 (65 FE) MG tablet Take 325 mg by mouth daily.    Yes [provider]  gabapentin (NEURONTIN) 100 MG capsule Take 200 mg by mouth at bedtime.  01/26/15  Yes [provider]  glipiZIDE (GLUCOTROL XL) 2.5 MG 24 hr tablet Take 2.5 mg by mouth daily with breakfast.   Yes [provider]  levothyroxine (SYNTHROID, LEVOTHROID) 50 MCG tablet Take 50 mcg by mouth daily before breakfast.  03/31/15  Yes [provider]  Melatonin 10 MG CAPS Take 10 mg by mouth at bedtime.    Yes [provider]  Menthol-Camphor (ICY HOT ADVANCED PAIN RELIEF) 16-11 % CREA Apply 1 application topically as directed.   Yes [provider]  metFORMIN (GLUCOPHAGE) 500 MG tablet Take 500 mg by mouth 2 (two) times daily with a meal.   Yes [provider]  metolazone (ZAROXOLYN) 2.5 MG tablet Take 2.5 mg by mouth once a week.   Yes [provider]  metoprolol tartrate (LOPRESSOR) 25 MG tablet Take 25 mg by mouth in the morning and at bedtime. 07/22/19  Yes [provider]  omeprazole (PRILOSEC) 20 MG capsule Take 20 mg by mouth daily.  03/24/15  Yes [provider]  potassium chloride SA (K-DUR,KLOR-CON) 20 MEQ tablet Take 1 tablet (20 mEq total) by mouth 2 (two) times daily. 11/22/16  Yes Cammie Sickle, MD  rifaximin (XIFAXAN) 550 MG TABS tablet Take 550 mg by mouth 2 (two) times daily.  01/20/15  Yes [provider]  simvastatin (ZOCOR) 20 MG tablet Take 20 mg by mouth at bedtime.  03/24/15  Yes [provider]  torsemide  (DEMADEX) 20 MG tablet Take 20 mg by mouth 2 (two) times daily.   Yes [provider]    Review of Systems  Constitutional:  Positive for fatigue (easily). Negative for appetite change.  HENT:  Positive for congestion. Negative for rhinorrhea and sore throat.   Eyes: Negative.   Respiratory:  Positive for cough (at times) and shortness of breath. Negative for apnea, chest tightness (intermittent heaviness on right upper chest at night) and wheezing.   Cardiovascular:  Positive for leg swelling. Negative for chest pain and palpitations.  Gastrointestinal:  Positive for abdominal distention (stable). Negative for abdominal pain.  Endocrine: Negative.   Genitourinary: Negative.   Musculoskeletal:  Positive for arthralgias (shoulder pain). Negative for neck pain.  Skin: Negative.   Allergic/Immunologic: Negative.   Neurological:  Negative for dizziness and light-headedness.  Hematological:  Negative for adenopathy. Bruises/bleeds easily.  Psychiatric/Behavioral:  Negative for dysphoric mood and sleep disturbance (sleeping on 2 standard pillows and a neck pillow). The patient is not nervous/anxious.    Vitals:   09/21/20 1111  BP: (!) 118/56  Pulse: 63  Resp: 18  SpO2: 96%  Weight: 167 lb 8 oz (76 kg)  Height: 5' (1.524 m)   Wt Readings from Last 3 Encounters:  09/21/20 167 lb 8 oz (76 kg)  03/25/20 156 lb 6 oz (70.9 kg)  12/24/19 158 lb 6 oz (71.8 kg)   Lab Results  Component Value Date   CREATININE 1.32 (H) 03/25/2020   CREATININE 1.09 (H) 11/04/2019   CREATININE 1.29 (H) 11/03/2019   Physical Exam Vitals and nursing note reviewed.  Constitutional:      Appearance: Normal appearance. Theresa [name] is not toxic-appearing or diaphoretic.  HENT:     Head: Normocephalic and atraumatic.  Cardiovascular:     Rate and Rhythm: Normal rate and regular rhythm.  Pulmonary:     Effort: Pulmonary effort is normal. No tachypnea or bradypnea.     Breath sounds: No decreased breath  sounds, wheezing, rhonchi or rales.  Abdominal:     General: There is distension.     Palpations: Abdomen is soft.     Tenderness: There is no abdominal tenderness.  Musculoskeletal:        General: No tenderness.     Cervical back: Normal range of motion and neck supple.     Right lower leg: Edema (1+ pitting) present.     Left lower leg: Edema (1+ pitting) present.  Skin:    General: Skin is warm and dry.  Neurological:     General: No focal deficit present.     Mental Status: Theresa [name] is alert and oriented to person, place, and time.  Psychiatric:        Mood and Affect: Mood normal.        Behavior: Behavior normal.        Thought Content: Thought content normal.   Assessment & Plan:  1: Chronic heart failure with preserved ejection fraction along with structural changes (LAE)- - NYHA class III - weighing daily; reminded to call for an overnight weight gain of >2 pounds or a weekly weight gain of >5 pounds - weight up 11 pounds from last visit here 6 months ago - not adding salt and trying to follow a low sodium diet, using salt substitute  - saw cardiology Nehemiah Massed) 05/19/20 - on GDMT of farxiga; unsure if BP could tolerate entresto - had right-sided thoracentesis done on 02/18/20 with removal of 720cc fluid - BNP 11/01/19 was 127.5  2: HTN- - BP looks good today - saw PCP (Hande) 09/10/20 - BMP 08/27/20 reviewed and showed sodium 139, potassium 3.4, creatinine 1.8 and GFR 27  3: DM- - A1c 11/11/19 was  5.7% - glucose at home today was 85  4: Lymphedema- - stage 2 - has been walking 1/3 of a mile numerous times a week - normally wears compression socks although didn't wear them today  5: Cirrhosis- - saw [facility] 05/06/20   Patient did not bring her medications nor a list. Each medication was verbally reviewed with the patient and Theresa [name] was encouraged to bring the bottles to every visit to confirm accuracy of list.  Due to HF stability will not make a return  appointment at this time. Advised patient that Theresa [name] could call back at anytime to schedule another appointment and Theresa [name] was comfortable with this plan.

## 2020-09-21 ENCOUNTER — Ambulatory Visit: Payer: Medicare Other | Attending: Family | Admitting: Family

## 2020-09-21 ENCOUNTER — Other Ambulatory Visit: Payer: Self-pay

## 2020-09-21 ENCOUNTER — Encounter: Payer: Self-pay | Admitting: Family

## 2020-09-21 VITALS — BP 118/56 | HR 63 | Resp 18 | Ht 60.0 in | Wt 167.5 lb

## 2020-09-21 DIAGNOSIS — I890 Lymphedema, not elsewhere classified: Secondary | ICD-10-CM | POA: Diagnosis not present

## 2020-09-21 DIAGNOSIS — K219 Gastro-esophageal reflux disease without esophagitis: Secondary | ICD-10-CM | POA: Insufficient documentation

## 2020-09-21 DIAGNOSIS — Z951 Presence of aortocoronary bypass graft: Secondary | ICD-10-CM | POA: Diagnosis not present

## 2020-09-21 DIAGNOSIS — J45909 Unspecified asthma, uncomplicated: Secondary | ICD-10-CM | POA: Diagnosis not present

## 2020-09-21 DIAGNOSIS — G4730 Sleep apnea, unspecified: Secondary | ICD-10-CM | POA: Insufficient documentation

## 2020-09-21 DIAGNOSIS — E785 Hyperlipidemia, unspecified: Secondary | ICD-10-CM | POA: Insufficient documentation

## 2020-09-21 DIAGNOSIS — E119 Type 2 diabetes mellitus without complications: Secondary | ICD-10-CM | POA: Diagnosis not present

## 2020-09-21 DIAGNOSIS — K7460 Unspecified cirrhosis of liver: Secondary | ICD-10-CM | POA: Diagnosis not present

## 2020-09-21 DIAGNOSIS — K7469 Other cirrhosis of liver: Secondary | ICD-10-CM

## 2020-09-21 DIAGNOSIS — I5032 Chronic diastolic (congestive) heart failure: Secondary | ICD-10-CM | POA: Insufficient documentation

## 2020-09-21 DIAGNOSIS — E1142 Type 2 diabetes mellitus with diabetic polyneuropathy: Secondary | ICD-10-CM

## 2020-09-21 DIAGNOSIS — Z7984 Long term (current) use of oral hypoglycemic drugs: Secondary | ICD-10-CM | POA: Insufficient documentation

## 2020-09-21 DIAGNOSIS — Z8249 Family history of ischemic heart disease and other diseases of the circulatory system: Secondary | ICD-10-CM | POA: Diagnosis not present

## 2020-09-21 DIAGNOSIS — Z8673 Personal history of transient ischemic attack (TIA), and cerebral infarction without residual deficits: Secondary | ICD-10-CM | POA: Diagnosis not present

## 2020-09-21 DIAGNOSIS — I2510 Atherosclerotic heart disease of native coronary artery without angina pectoris: Secondary | ICD-10-CM | POA: Diagnosis not present

## 2020-09-21 DIAGNOSIS — I110 Hypertensive heart disease with heart failure: Secondary | ICD-10-CM | POA: Insufficient documentation

## 2020-09-21 DIAGNOSIS — I10 Essential (primary) hypertension: Secondary | ICD-10-CM

## 2020-09-21 NOTE — Patient Instructions (Addendum)
Continue weighing daily and call for an overnight weight gain of > 2 pounds or a weekly weight gain of >5 pounds.    Call us in the future if you need [place] for anything

## 2020-11-13 DIAGNOSIS — K7682 Hepatic encephalopathy: Secondary | ICD-10-CM | POA: Insufficient documentation

## 2020-11-17 DIAGNOSIS — I4892 Unspecified atrial flutter: Secondary | ICD-10-CM | POA: Insufficient documentation

## 2020-11-19 ENCOUNTER — Emergency Department: Payer: Medicare Other

## 2020-11-19 ENCOUNTER — Other Ambulatory Visit: Payer: Self-pay

## 2020-11-19 ENCOUNTER — Observation Stay: Payer: Medicare Other

## 2020-11-19 ENCOUNTER — Inpatient Hospital Stay
Admission: EM | Admit: 2020-11-19 | Discharge: 2020-11-21 | DRG: 083 | Disposition: A | Discharge status: Home Health | Payer: Medicare Other | Attending: Internal Medicine | Admitting: Internal Medicine

## 2020-11-19 DIAGNOSIS — Z79890 Hormone replacement therapy (postmenopausal): Secondary | ICD-10-CM

## 2020-11-19 DIAGNOSIS — Z96653 Presence of artificial knee joint, bilateral: Secondary | ICD-10-CM | POA: Diagnosis present

## 2020-11-19 DIAGNOSIS — M1990 Unspecified osteoarthritis, unspecified site: Secondary | ICD-10-CM | POA: Diagnosis present

## 2020-11-19 DIAGNOSIS — D472 Monoclonal gammopathy: Secondary | ICD-10-CM | POA: Diagnosis present

## 2020-11-19 DIAGNOSIS — W1830XA Fall on same level, unspecified, initial encounter: Secondary | ICD-10-CM | POA: Diagnosis present

## 2020-11-19 DIAGNOSIS — Z951 Presence of aortocoronary bypass graft: Secondary | ICD-10-CM

## 2020-11-19 DIAGNOSIS — G8929 Other chronic pain: Secondary | ICD-10-CM | POA: Diagnosis present

## 2020-11-19 DIAGNOSIS — Z885 Allergy status to narcotic agent status: Secondary | ICD-10-CM

## 2020-11-19 DIAGNOSIS — Z7984 Long term (current) use of oral hypoglycemic drugs: Secondary | ICD-10-CM

## 2020-11-19 DIAGNOSIS — I4892 Unspecified atrial flutter: Secondary | ICD-10-CM | POA: Diagnosis present

## 2020-11-19 DIAGNOSIS — Z91048 Other nonmedicinal substance allergy status: Secondary | ICD-10-CM

## 2020-11-19 DIAGNOSIS — E876 Hypokalemia: Secondary | ICD-10-CM

## 2020-11-19 DIAGNOSIS — Z8673 Personal history of transient ischemic attack (TIA), and cerebral infarction without residual deficits: Secondary | ICD-10-CM

## 2020-11-19 DIAGNOSIS — G4730 Sleep apnea, unspecified: Secondary | ICD-10-CM | POA: Diagnosis present

## 2020-11-19 DIAGNOSIS — M25551 Pain in right hip: Secondary | ICD-10-CM | POA: Diagnosis present

## 2020-11-19 DIAGNOSIS — J45909 Unspecified asthma, uncomplicated: Secondary | ICD-10-CM | POA: Diagnosis present

## 2020-11-19 DIAGNOSIS — I959 Hypotension, unspecified: Secondary | ICD-10-CM | POA: Diagnosis present

## 2020-11-19 DIAGNOSIS — Z79899 Other long term (current) drug therapy: Secondary | ICD-10-CM

## 2020-11-19 DIAGNOSIS — N1832 Chronic kidney disease, stage 3b: Secondary | ICD-10-CM | POA: Diagnosis present

## 2020-11-19 DIAGNOSIS — K219 Gastro-esophageal reflux disease without esophagitis: Secondary | ICD-10-CM | POA: Diagnosis present

## 2020-11-19 DIAGNOSIS — K7581 Nonalcoholic steatohepatitis (NASH): Secondary | ICD-10-CM | POA: Diagnosis present

## 2020-11-19 DIAGNOSIS — D696 Thrombocytopenia, unspecified: Secondary | ICD-10-CM | POA: Diagnosis present

## 2020-11-19 DIAGNOSIS — S066X9A Traumatic subarachnoid hemorrhage with loss of consciousness of unspecified duration, initial encounter: Secondary | ICD-10-CM | POA: Diagnosis not present

## 2020-11-19 DIAGNOSIS — I5042 Chronic combined systolic (congestive) and diastolic (congestive) heart failure: Secondary | ICD-10-CM | POA: Diagnosis present

## 2020-11-19 DIAGNOSIS — I639 Cerebral infarction, unspecified: Secondary | ICD-10-CM | POA: Diagnosis present

## 2020-11-19 DIAGNOSIS — I609 Nontraumatic subarachnoid hemorrhage, unspecified: Secondary | ICD-10-CM | POA: Diagnosis not present

## 2020-11-19 DIAGNOSIS — E785 Hyperlipidemia, unspecified: Secondary | ICD-10-CM | POA: Diagnosis present

## 2020-11-19 DIAGNOSIS — I480 Paroxysmal atrial fibrillation: Secondary | ICD-10-CM | POA: Diagnosis present

## 2020-11-19 DIAGNOSIS — W19XXXA Unspecified fall, initial encounter: Secondary | ICD-10-CM

## 2020-11-19 DIAGNOSIS — Z20822 Contact with and (suspected) exposure to covid-19: Secondary | ICD-10-CM | POA: Diagnosis present

## 2020-11-19 DIAGNOSIS — M25559 Pain in unspecified hip: Secondary | ICD-10-CM

## 2020-11-19 DIAGNOSIS — I130 Hypertensive heart and chronic kidney disease with heart failure and stage 1 through stage 4 chronic kidney disease, or unspecified chronic kidney disease: Secondary | ICD-10-CM | POA: Diagnosis present

## 2020-11-19 DIAGNOSIS — S0990XA Unspecified injury of head, initial encounter: Secondary | ICD-10-CM

## 2020-11-19 DIAGNOSIS — E1151 Type 2 diabetes mellitus with diabetic peripheral angiopathy without gangrene: Secondary | ICD-10-CM | POA: Diagnosis present

## 2020-11-19 DIAGNOSIS — K7469 Other cirrhosis of liver: Secondary | ICD-10-CM | POA: Diagnosis present

## 2020-11-19 DIAGNOSIS — Z800 Family history of malignant neoplasm of digestive organs: Secondary | ICD-10-CM

## 2020-11-19 DIAGNOSIS — I509 Heart failure, unspecified: Secondary | ICD-10-CM

## 2020-11-19 DIAGNOSIS — Z7983 Long term (current) use of bisphosphonates: Secondary | ICD-10-CM

## 2020-11-19 DIAGNOSIS — E1122 Type 2 diabetes mellitus with diabetic chronic kidney disease: Secondary | ICD-10-CM | POA: Diagnosis present

## 2020-11-19 DIAGNOSIS — S0101XA Laceration without foreign body of scalp, initial encounter: Secondary | ICD-10-CM | POA: Diagnosis present

## 2020-11-19 DIAGNOSIS — I2510 Atherosclerotic heart disease of native coronary artery without angina pectoris: Secondary | ICD-10-CM | POA: Diagnosis present

## 2020-11-19 DIAGNOSIS — Z823 Family history of stroke: Secondary | ICD-10-CM

## 2020-11-19 DIAGNOSIS — E039 Hypothyroidism, unspecified: Secondary | ICD-10-CM | POA: Diagnosis present

## 2020-11-19 DIAGNOSIS — M545 Low back pain, unspecified: Secondary | ICD-10-CM | POA: Diagnosis present

## 2020-11-19 DIAGNOSIS — Z888 Allergy status to other drugs, medicaments and biological substances status: Secondary | ICD-10-CM

## 2020-11-19 DIAGNOSIS — Y92002 Bathroom of unspecified non-institutional (private) residence single-family (private) house as the place of occurrence of the external cause: Secondary | ICD-10-CM

## 2020-11-19 DIAGNOSIS — I10 Essential (primary) hypertension: Secondary | ICD-10-CM | POA: Diagnosis present

## 2020-11-19 DIAGNOSIS — Z86010 Personal history of colonic polyps: Secondary | ICD-10-CM

## 2020-11-19 DIAGNOSIS — K7460 Unspecified cirrhosis of liver: Secondary | ICD-10-CM | POA: Diagnosis present

## 2020-11-19 DIAGNOSIS — K7290 Hepatic failure, unspecified without coma: Secondary | ICD-10-CM | POA: Diagnosis present

## 2020-11-19 DIAGNOSIS — I071 Rheumatic tricuspid insufficiency: Secondary | ICD-10-CM | POA: Diagnosis present

## 2020-11-19 DIAGNOSIS — K766 Portal hypertension: Secondary | ICD-10-CM | POA: Diagnosis present

## 2020-11-19 DIAGNOSIS — N183 Chronic kidney disease, stage 3 unspecified: Secondary | ICD-10-CM

## 2020-11-19 DIAGNOSIS — I272 Pulmonary hypertension, unspecified: Secondary | ICD-10-CM | POA: Diagnosis present

## 2020-11-19 DIAGNOSIS — Z8249 Family history of ischemic heart disease and other diseases of the circulatory system: Secondary | ICD-10-CM

## 2020-11-19 DIAGNOSIS — E1142 Type 2 diabetes mellitus with diabetic polyneuropathy: Secondary | ICD-10-CM | POA: Diagnosis present

## 2020-11-19 DIAGNOSIS — N179 Acute kidney failure, unspecified: Secondary | ICD-10-CM | POA: Diagnosis present

## 2020-11-19 DIAGNOSIS — S0181XA Laceration without foreign body of other part of head, initial encounter: Secondary | ICD-10-CM

## 2020-11-19 LAB — BASIC METABOLIC PANEL
Anion gap: 9 (ref 5–15)
BUN: 45 mg/dL — ABNORMAL HIGH (ref 8–23)
CO2: 33 mmol/L — ABNORMAL HIGH (ref 22–32)
Calcium: 9.4 mg/dL (ref 8.9–10.3)
Chloride: 93 mmol/L — ABNORMAL LOW (ref 98–111)
Creatinine, Ser: 2.23 mg/dL — ABNORMAL HIGH (ref 0.44–1.00)
GFR, Estimated: 21 mL/min — ABNORMAL LOW (ref >60)
Glucose, Bld: 199 mg/dL — ABNORMAL HIGH (ref 70–99)
Potassium: 4.4 mmol/L (ref 3.5–5.1)
Sodium: 135 mmol/L (ref 135–145)

## 2020-11-19 LAB — URINALYSIS, COMPLETE (UACMP) WITH MICROSCOPIC
Bacteria, UA: NONE SEEN
Bilirubin Urine: NEGATIVE
Glucose, UA: >=500 mg/dL — AB
Ketones, ur: NEGATIVE mg/dL
Leukocytes,Ua: NEGATIVE
Nitrite: NEGATIVE
Protein, ur: NEGATIVE mg/dL
Specific Gravity, Urine: 1.006 (ref 1.005–1.030)
pH: 7 (ref 5.0–8.0)

## 2020-11-19 LAB — HEPATIC FUNCTION PANEL
ALT: 33 U/L (ref 0–44)
AST: 54 U/L — ABNORMAL HIGH (ref 15–41)
Albumin: 3 g/dL — ABNORMAL LOW (ref 3.5–5.0)
Alkaline Phosphatase: 99 U/L (ref 38–126)
Bilirubin, Direct: 0.8 mg/dL — ABNORMAL HIGH (ref 0.0–0.2)
Indirect Bilirubin: 2.7 mg/dL — ABNORMAL HIGH (ref 0.3–0.9)
Total Bilirubin: 3.5 mg/dL — ABNORMAL HIGH (ref 0.3–1.2)
Total Protein: 7.2 g/dL (ref 6.5–8.1)

## 2020-11-19 LAB — CBC
HCT: 29.3 % — ABNORMAL LOW (ref 36.0–46.0)
Hemoglobin: 10.3 g/dL — ABNORMAL LOW (ref 12.0–15.0)
MCH: 32.5 pg (ref 26.0–34.0)
MCHC: 35.2 g/dL (ref 30.0–36.0)
MCV: 92.4 fL (ref 80.0–100.0)
Platelets: 62 10*3/uL — ABNORMAL LOW (ref 150–400)
RBC: 3.17 MIL/uL — ABNORMAL LOW (ref 3.87–5.11)
RDW: 17.3 % — ABNORMAL HIGH (ref 11.5–15.5)
WBC: 3.7 10*3/uL — ABNORMAL LOW (ref 4.0–10.5)
nRBC: 0 % (ref 0.0–0.2)

## 2020-11-19 LAB — RESP PANEL BY RT-PCR (FLU A&B, COVID) ARPGX2
Influenza A by PCR: NEGATIVE
Influenza B by PCR: NEGATIVE
SARS Coronavirus 2 by RT PCR: NEGATIVE

## 2020-11-19 LAB — PROTIME-INR
INR: 1.2 (ref 0.8–1.2)
Prothrombin Time: 15.4 seconds — ABNORMAL HIGH (ref 11.4–15.2)

## 2020-11-19 LAB — AMMONIA: Ammonia: 39 umol/L — ABNORMAL HIGH (ref 9–35)

## 2020-11-19 LAB — PROCALCITONIN: Procalcitonin: 0.19 ng/mL

## 2020-11-19 MED ORDER — ONDANSETRON HCL 4 MG/2ML IJ SOLN
4.0000 mg | Freq: Four times a day (QID) | INTRAMUSCULAR | Status: DC | PRN
Start: 2020-11-19 — End: 2020-11-21

## 2020-11-19 MED ORDER — ACETAMINOPHEN 325 MG PO TABS
650.0000 mg | ORAL_TABLET | Freq: Four times a day (QID) | ORAL | Status: DC | PRN
  Administered 2020-11-19 – 2020-11-21 (×3): 650 mg via ORAL
  Filled 2020-11-19 (×3): qty 2

## 2020-11-19 MED ORDER — ACETAMINOPHEN 650 MG RE SUPP: 650.0000 mg | Freq: Four times a day (QID) | RECTAL | Status: DC | PRN

## 2020-11-19 MED ORDER — POLYETHYLENE GLYCOL 3350 17 G PO PACK
17.0000 g | PACK | Freq: Two times a day (BID) | ORAL | Status: DC
  Administered 2020-11-19: 17 g via ORAL
  Filled 2020-11-19 (×2): qty 1

## 2020-11-19 MED ORDER — LACTULOSE 10 GM/15ML PO SOLN: 10.0000 g | Freq: Two times a day (BID) | ORAL | Status: DC | PRN

## 2020-11-19 MED ORDER — LEVOTHYROXINE SODIUM 50 MCG PO TABS
50.0000 ug | ORAL_TABLET | Freq: Every day | ORAL | Status: DC
  Administered 2020-11-20 – 2020-11-21 (×2): 50 ug via ORAL
  Filled 2020-11-19 (×2): qty 1

## 2020-11-19 MED ORDER — RIFAXIMIN 550 MG PO TABS
550.0000 mg | ORAL_TABLET | Freq: Two times a day (BID) | ORAL | Status: DC
  Administered 2020-11-19 – 2020-11-21 (×4): 550 mg via ORAL
  Filled 2020-11-19 (×6): qty 1

## 2020-11-19 MED ORDER — HYDRALAZINE HCL 20 MG/ML IJ SOLN: 5.0000 mg | INTRAMUSCULAR | Status: DC | PRN

## 2020-11-19 MED ORDER — LIDOCAINE-EPINEPHRINE 2 %-1:100000 IJ SOLN
20.0000 mL | Freq: Once | INTRAMUSCULAR | Status: AC
  Administered 2020-11-19: 20 mL
  Filled 2020-11-19: qty 1

## 2020-11-19 MED ORDER — SIMVASTATIN 20 MG PO TABS
20.0000 mg | ORAL_TABLET | Freq: Every day | ORAL | Status: DC
  Administered 2020-11-19 – 2020-11-20 (×2): 20 mg via ORAL
  Filled 2020-11-19 (×2): qty 1

## 2020-11-19 MED ORDER — ONDANSETRON HCL 4 MG PO TABS
4.0000 mg | ORAL_TABLET | Freq: Four times a day (QID) | ORAL | Status: DC | PRN
Start: 2020-11-19 — End: 2020-11-21

## 2020-11-19 NOTE — ED Notes (Signed)
Patient placed on purewick at this time.

## 2020-11-19 NOTE — Consult Note (Addendum)
Neurosurgery-New Consultation Evaluation 11/19/2020 Theresa [name] [id number]  Identifying Statement: Theresa [name] is a 80 y.o. female from [place] [id number] after a fall.  Physician Requesting Consultation: No ref. provider found  History of Present Illness: Theresa [name] is a 79 y.o female with a past medical history cirrhosis, CHF, diabetes, hypotension A fib presenting today after a syncopal fall with positive loss of consciousness.  Says she was standing up from the commode when she passed out.  She does not recall the exact details.  She does complain of some neck pain and headache.  She denies any focal neurologic deficit outside of her chronic right-sided weakness secondary to previous stroke.  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   Sleep apnea    SOB (shortness of breath)    Stroke (HCC)    Temporal arteritis (HCC)    Thrombocytopenia (HCC)    Thrombocytopenia (HCC)     Social History: Social History   Socioeconomic History   Marital status: Married    Spouse name: [employer]   Number of children: Not on file   Years of education: Not on file   Highest education level: Not on file  Occupational History   Not on file  Tobacco Use   Smoking status: Never   Smokeless tobacco: Never  Vaping Use   Vaping Use: Never used  Substance and Sexual Activity   Alcohol use: No    Alcohol/week: 0.0 standard  drinks   Drug use: No   Sexual activity: Not on file  Other Topics Concern   Not on file  Social History Narrative   Not on file   Social Determinants of Health   Financial Resource Strain: Not on file  Food Insecurity: Not on file  Transportation Needs: Not on file  Physical Activity: Not on file  Stress: Not on file  Social Connections: Not on file  Intimate Partner Violence: Not on file    Family History: Family History  Problem Relation Age of Onset   Heart disease Other    Hypertension Other    Anemia Other    Colon cancer Other    CVA Mother    Dementia Mother    CAD Father    Breast cancer Neg Hx     Review of Systems:  Review of Systems - General ROS: Negative Psychological ROS: Negative Ophthalmic ROS: Negative ENT ROS: Negative Hematological and Lymphatic ROS: Negative  Endocrine ROS: Negative Respiratory ROS: Negative Cardiovascular ROS: Negative Gastrointestinal ROS: Negative Genito-Urinary ROS: Negative Musculoskeletal ROS: Negative Neurological ROS: Negative Dermatological ROS: Negative  Physical Exam: BP (!) 105/43   Pulse 65   Temp 97.9 F (36.6 C) (Oral)   Resp 16   Ht 5' (1.524 m)   Wt 73.5 kg   SpO2 94%  BMI 31.64 kg/m  Body mass index is 31.64 kg/m. Body surface area is 1.76 meters squared. General appearance: Alert, cooperative, in no acute distress Head: Normocephalic, atraumatic Eyes: Normal, EOM intact Oropharynx: Moist without lesions Neck: Supple, no tenderness Heart: Normal, regular rate and rhythm, without murmur Lungs: Clear to auscultation, good air exchange Abdomen: Soft, nondistended Ext: No edema in LE bilaterally, good distal pulses  Neurologic exam:  Mental status: Patient drowsy but easily aroused to voice.  Oriented x3 Speech: fluent and clear Cranial nerves:  CN II-XII grossly intact Motor: 4-/5 right IO and handgrip otherwise 5/5 throughout proximal RUE and LUE. 4-/5 throughout RLE. 5/55 throughout LLE.   Has a right pronator drift Sensory: intact to light touch in all extremities Reflexes: 1+ and symmetric bilaterally for arms and legs Coordination: intact finger to nose Gait: Not assessed  Laboratory: Results for orders placed or performed during the hospital encounter of 11/19/20  Resp Panel by RT-PCR (Flu A&B, Covid) Nasopharyngeal Swab   Specimen: Nasopharyngeal Swab; Nasopharyngeal(NP) swabs in vial transport medium  Result Value Ref Range   SARS Coronavirus 2 by RT PCR NEGATIVE NEGATIVE   Influenza A by PCR NEGATIVE NEGATIVE   Influenza B by PCR NEGATIVE NEGATIVE  Basic metabolic panel  Result Value Ref Range   Sodium 135 135 - 145 mmol/L   Potassium 4.4 3.5 - 5.1 mmol/L   Chloride 93 (L) 98 - 111 mmol/L   CO2 33 (H) 22 - 32 mmol/L   Glucose, Bld 199 (H) 70 - 99 mg/dL   BUN 45 (H) 8 - 23 mg/dL   Creatinine, Ser 2.23 (H) 0.44 - 1.00 mg/dL   Calcium 9.4 8.9 - 10.3 mg/dL   GFR, Estimated 21 (L) >60 mL/min   Anion gap 9 5 - 15  CBC  Result Value Ref Range   WBC 3.7 (L) 4.0 - 10.5 K/uL   RBC 3.17 (L) 3.87 - 5.11 MIL/uL   Hemoglobin 10.3 (L) 12.0 - 15.0 g/dL   HCT 29.3 (L) 36.0 - 46.0 %   MCV 92.4 80.0 - 100.0 fL   MCH 32.5 26.0 - 34.0 pg   MCHC 35.2 30.0 - 36.0 g/dL   RDW 17.3 (H) 11.5 - 15.5 %   Platelets 62 (L) 150 - 400 K/uL   nRBC 0.0 0.0 - 0.2 %  Urinalysis, Complete w Microscopic  Result Value Ref Range   Color, Urine STRAW (A) YELLOW   APPearance CLEAR (A) CLEAR   Specific Gravity, Urine 1.006 1.005 - 1.030   pH 7.0 5.0 - 8.0   Glucose, UA >=500 (A) NEGATIVE mg/dL   Hgb urine dipstick MODERATE (A) NEGATIVE   Bilirubin Urine NEGATIVE NEGATIVE   Ketones, ur NEGATIVE NEGATIVE mg/dL   Protein, ur NEGATIVE NEGATIVE mg/dL   Nitrite NEGATIVE NEGATIVE   Leukocytes,Ua NEGATIVE NEGATIVE   WBC, UA 0-5 0 - 5 WBC/hpf   Bacteria, UA NONE SEEN NONE SEEN   Squamous Epithelial / LPF 0-5 0 - 5  Hepatic function panel  Result Value Ref Range   Total Protein 7.2 6.5 - 8.1  g/dL   Albumin 3.0 (L) 3.5 - 5.0 g/dL   AST 54 (H) 15 - 41 U/L   ALT 33 0 - 44 U/L   Alkaline Phosphatase 99 38 - 126 U/L   Total Bilirubin 3.5 (H) 0.3 - 1.2 mg/dL   Bilirubin, Direct 0.8 (H) 0.0 - 0.2 mg/dL   Indirect Bilirubin 2.7 (H) 0.3 - 0.9 mg/dL  Ammonia  Result  Value Ref Range   Ammonia 39 (H) 9 - 35 umol/L  Protime-INR  Result Value Ref Range   Prothrombin Time 15.4 (H) 11.4 - 15.2 seconds   INR 1.2 0.8 - 1.2   I personally reviewed labs  Imaging: CT c-spine 11/19/20 IMPRESSION: 1. No acute cervical spine fracture. 2. Multilevel cervical spondylosis and facet hypertrophy. 3. Small bilateral pleural effusions, right greater than left.   Electronically Signed   By: Randa Ngo M.D.   On: 11/19/2020 16:32  CT head 11/19/20 IMPRESSION: 1. Subarachnoid hemorrhage and small parenchymal contusion within the left frontal lobe, with trace subarachnoid hemorrhage along the right frontal convexity. No mass effect or midline shift. 2. Left frontal scalp laceration.  No acute fracture.   Critical Value/emergent results were called by telephone at the time of interpretation on 11/19/2020 at 4:29 pm to provider DR Tamala Julian, who verbally acknowledged these results.   Electronically Signed   By: Randa Ngo M.D.   On: 11/19/2020 16:33   Impression/Plan:     1.  Diagnosis: Chronic subarachnoid hemorrhage with underlying small parenchymal contusion to left frontal lobe.  2.  Plan - Repeat HCT in 6 hours (ordered for 10:30pm) - Continue with syncopal work-up and medical optimization. -No indication for acute neurosurgical intervention at this time.  Cooper Render PA-C Neurosurgery

## 2020-11-19 NOTE — ED Notes (Signed)
Informed RN bed assigned 

## 2020-11-19 NOTE — ED Notes (Signed)
Pt here from home via EMS fall from standing position, lac above left eye, bleeding controlled at this time, no blood thinner use. Glucose 346. Pt doesn't remember fall. Physical Therapist found pt on floor in home. 111/42; NSR 70's; 96% RA. Placed in [place] chair.

## 2020-11-19 NOTE — H&P (Addendum)
Addendum: received message from nursing staff that patient CT read was positive for a new bleeding area.  CT was read as new area of hemorrhage along the septum pellucidum to the left of the midline with evidence of pooling of intraventricular blood in the occipital horn of the left lateral ventricle.  This area of hemorrhage measures up to 13 mm in dimension.  I requested nursing staff to repeat vitals and do a neuro exam.  Repeat vitals showed temperature of 98.8, blood pressure 99/43, MAP of 59, heart rate of 70, respiration rate of 18, SPO2 of 93% on room air.  At bedside patient is able to tell me her name, age, her location of hospital.  She remembers me from the evaluation earlier. She reports worsening headache in the last hour. I paged Dr. Cari Caraway, neurosurgeon and relayed the new CT read. Neurosurgeon, Dr. Izora Ribas recommends repeat CT imaging in 6 hours.  Continue to monitor.  Patient is unlikely to be a candidate for surgical intervention. This will be communicated with cross coverage provider.   Dr. Tobie Poet [facility] Hospitalist   History and Physical   [name] TZG:[id number] DOB: 22-May-1940 DOA: 11/19/2020  PCP: Tracie Harrier, MD  Outpatient Specialists: Dr. [facility] Patient coming from: Home via EMS  I have personally briefly reviewed patient's old medical records in [place].  Chief Concern: Fall  HPI: Theresa [name] is a 80 y.o. female with medical history significant for paroxysmal atrial fibrillation, history of CAD status post CABG, heart failure preserved ejection fraction, NYHA class III, hypertension, hypothyroid, bilateral carotid artery stenosis, hyperlipidemia, presents emergency department for chief concerns of a fall and head trauma.  At bedside patient was able to tell me her name, her age, and identify her eldest son, Theresa [name] at bedside.  She reports that she woke up in bathroom laying in blood.  She remembered that on  day of admission, she woke up in the a.m. and had her usual bath/shower at 6:30/7 AM then she went to kitchen to fix herself a smoothie and took her morning medications.   The next thing she knew, the occupational therapist found her on the floor in the restroom laying in blood. The time of this was approximately noon.   She denies nausea, vomiting, fever, dysuria. She endorses shortness of breath that is her baseline. She denies diarrhea and reports her last BM was normal and it was AM of 11/19/20.   She was in the hospital last week for metabolic encephalopathy and treated for low potassium at [facility]. She was discharge home Friday evening, 11/13/2020 with home PT and OT.   She endorses right hip pain and she states this started today.  Social history: She lives at home with her husband. She denies tobacco use, etoh use, recreational drug use. 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   Vaccination history: She is vaccinated for covid 19, pfizer, two doses and one booster.   ROS: Constitutional: no weight change, no fever ENT/Mouth: no sore throat, no rhinorrhea Eyes: no eye pain, no vision changes Cardiovascular: no chest pain, + dyspnea,  no edema, no palpitations Respiratory: + cough, + sputum, + wheezing Gastrointestinal: no nausea, no vomiting, no diarrhea, no constipation Genitourinary: no urinary incontinence, no dysuria, no hematuria Musculoskeletal: no arthralgias, no myalgias Skin: no skin lesions, no pruritus, Neuro: + weakness, + loss of consciousness, + syncope Psych: no anxiety, no depression, + decrease appetite Heme/Lymph: no bruising, + bleeding  ED Course: Discussed with emergency medical provider, patient requiring hospitalization for subarachnoid hemorrhage presumed secondary to a fall.  Vitals in the emergency department was remarkable for temperature 97.9, respiration rate of 20, heart rate of 69, blood pressure 116/55, SPO2 100% on room air.  Labs in  the emergency department was remarkable for WBC 3.7, hemoglobin 10.3, platelets 62, sodium 134, potassium 4.4, chloride 93, bicarb 33, BUN 45, serum creatinine of 2.23, nonfasting blood glucose 199, EGFR 21.  Assessment/Plan  Principal Problem:   Subarachnoid bleed (HCC) Active Problems:   Thrombocytopenia (HCC)   MGUS (monoclonal gammopathy of unknown significance)   Cirrhosis (HCC)   Cirrhosis, cryptogenic (HCC)   CVA (cerebral vascular accident) ([place])   Type 2 diabetes mellitus with peripheral neuropathy ([place])   Benign essential hypertension   Paroxysmal A-fib (HCC)   AKI (acute kidney injury) ([place])   CKD (chronic kidney disease), stage III ([place])   # Subarachnoid bleed-presumed nonaneurysmal -Etiology of the fall is multifactorial including metabolic encephalopathy in setting of liver cirrhosis versus symptomatic bradycardia versus sick sinus syndrome - Recommend a.m. team to consider discharging patient home with telemetry monitor  -Presumed secondary to a traumatic fall - Full work-up in progress at this time - Checking procalcitonin - Repeat CT of the head scheduled for 6 hours after initial CT - Admit to progressive cardiac, observation, with telemetry - At this time, bleeding is not stabilized, I am not ordering pharmacologic DVT prophylaxis - TED hose - Patient is low normotensive, calcium [name] blocker has not been ordered  # Right forehead and eye ecchymosis-present on admission - Sutures in place  # Liver cirrhosis-resumed rifaximin 550 mg p.o. twice daily - Titrate bowel movements to 3 times a day - Patient does not take lactulose at this time as per patient and family GlycoLax is enough to titrate patient's bowel movements for 3 times a day - Lactulose 10 g p.o. twice daily as needed for constipation ordered  # History of hypertension-currently low normotensive - Hydralazine injection 5 mg IV every 4 hours as needed for SBP greater than 160, 2 days ordered  #  Hyperlipidemia-20 mg p.o. nightly ordered  # Hypothyroid-levothyroxine 50 mcg daily resumed  Chart reviewed.   DVT prophylaxis: TED hose Code Status: Full code Diet: Heart healthy Family Communication: Son, Theresa [name] at bedside was updated  Disposition Plan: Pending clinical course Consults called: Neurosurgery Admission status: Progressive cardiac, observation, telemetry ordered  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   Sleep apnea    SOB (shortness of breath)    Stroke (HCC)    Temporal arteritis (HCC)    Thrombocytopenia (HCC)    Thrombocytopenia ([place])    Past Surgical History:  Procedure Laterality Date   COLONOSCOPY  10/24/2013, 2002   hyperplastic polpys   CORONARY ANGIOPLASTY     CORONARY  ARTERY BYPASS GRAFT  09/1998   ESOPHAGOGASTRODUODENOSCOPY (EGD) WITH PROPOFOL N/A 08/26/2016   Procedure: ESOPHAGOGASTRODUODENOSCOPY (EGD) WITH PROPOFOL;  Surgeon: Manya Silvas, MD;  Location[facility] ENDOSCOPY;  Service: Endoscopy;  Laterality: N/A;   ESOPHAGOGASTRODUODENOSCOPY (EGD) WITH PROPOFOL N/A 02/05/2018   Procedure: ESOPHAGOGASTRODUODENOSCOPY (EGD) WITH PROPOFOL;  Surgeon: Manya Silvas, MD;  Location: [facility] ENDOSCOPY;  Service: Endoscopy;  Laterality: N/A;   ESOPHAGOGASTRODUODENOSCOPY (EGD) WITH PROPOFOL N/A 07/22/2019   Procedure: ESOPHAGOGASTRODUODENOSCOPY (EGD) WITH PROPOFOL;  Surgeon: [place], Benay Pike, MD;  Location: [facility] ENDOSCOPY;  Service:  Gastroenterology;  Laterality: N/A;   ESOPHAGOGASTRODUODENOSCOPY ENDOSCOPY  05/24/2011   hemorroid surgery     JOINT REPLACEMENT  2003  2005   bil.[email or web address]   Social History:  reports that she has never smoked. She has never used smokeless tobacco. She reports that she does not drink alcohol and does not use drugs.  Allergies  Allergen Reactions   Tape Rash   Morphine And Related Nausea And Vomiting    Other reaction(s): Nausea And Vomiting   Other Rash    NEOPRENE, TAPE, BAND-AID TOUGH STRIPS   Family History  Problem Relation Age of Onset   Heart disease Other    Hypertension Other    Anemia Other    Colon cancer Other    CVA Mother    Dementia Mother    CAD Father    Breast cancer Neg Hx    Family history: Family history reviewed and not pertinent  Prior to Admission medications   Medication Sig Start Date End Date Taking? Authorizing Provider  alendronate (FOSAMAX) 70 MG tablet Take 70 mg by mouth every Monday. Take with a full glass of water on an empty stomach.   Yes [provider]  Calcium Carbonate-Vitamin D 600-200 MG-UNIT TABS Take 1 tablet by mouth daily.   Yes [provider]  CINNAMON PO Take by mouth.   Yes [provider]  Cranberry-Milk Thistle 250-75 MG CAPS Take 1 capsule by mouth 3 (three) times daily.    Yes [provider]  dapagliflozin propanediol (FARXIGA) 10 MG TABS tablet Take 10 mg by mouth daily.   Yes [provider]  ferrous sulfate 325 (65 FE) MG tablet Take 325 mg by mouth daily.    Yes [provider]  gabapentin (NEURONTIN) 100 MG capsule Take 200 mg by mouth at bedtime.  01/26/15  Yes [provider]  levothyroxine (SYNTHROID, LEVOTHROID) 50 MCG tablet Take 50 mcg by mouth daily before breakfast.  03/31/15  Yes [provider]  Melatonin 10 MG CAPS Take 10 mg by mouth at bedtime.    Yes [provider]  Menthol-Camphor (ICY HOT ADVANCED PAIN RELIEF) 16-11 % CREA Apply  1 application topically as directed.   Yes [provider]  metoprolol succinate (TOPROL-XL) 25 MG 24 hr tablet Take 12.5 mg by mouth daily. 11/13/20  Yes [provider]  omeprazole (PRILOSEC) 20 MG capsule Take 20 mg by mouth daily.  03/24/15  Yes [provider]  potassium chloride SA (K-DUR,KLOR-CON) 20 MEQ tablet Take 1 tablet (20 mEq total) by mouth 2 (two) times daily. Patient taking differently: Take 20 mEq by mouth daily. 11/22/16  Yes Cammie Sickle, MD  rifaximin (XIFAXAN) 550 MG TABS tablet Take 550 mg by mouth 2 (two) times daily.  01/20/15  Yes [provider]  simvastatin (ZOCOR) 20 MG tablet Take 20 mg by mouth at bedtime.  03/24/15  Yes [provider]  spironolactone (ALDACTONE) 50  MG tablet Take 50 mg by mouth daily. 11/13/20  Yes [provider]  torsemide (DEMADEX) 20 MG tablet Take 20 mg by mouth 2 (two) times daily.   Yes [provider]  blood glucose meter kit and supplies KIT Dispense based on patient and insurance preference. Use up to four times daily as directed. (FOR ICD-9 250.00, 250.01). 04/24/16   Gladstone Lighter, MD  glipiZIDE (GLUCOTROL XL) 2.5 MG 24 hr tablet Take 2.5 mg by mouth daily with breakfast. Patient not taking: No sig reported    [provider]  metFORMIN (GLUCOPHAGE) 500 MG tablet Take 500 mg by mouth 2 (two) times daily with a meal. Patient not taking: No sig reported    [provider]  metolazone (ZAROXOLYN) 2.5 MG tablet Take 2.5 mg by mouth once a week. Patient not taking: Reported on 11/19/2020    [provider]  metoprolol tartrate (LOPRESSOR) 25 MG tablet Take 25 mg by mouth in the morning and at bedtime. Patient not taking: Reported on 11/19/2020 07/22/19   [provider]   Physical Exam: Vitals:   11/19/20 1714 11/19/20 1830 11/19/20 1900 11/19/20 2005  BP: (!) 105/43 (!) 103/45 (!) 107/46 (!) 99/53  Pulse: 65 64 68 64  Resp: _0 Temp:    98.1 F (36.7 C)  TempSrc:      SpO2: 94% 96% 96% 98%  Weight:      Height:       Constitutional: appears age-appropriate, NAD, calm, comfortable Eyes: PERRL, lids and conjunctivae normal, left forehead ecchymosis with sutures in place  ENMT: Mucous membranes are moist. Posterior pharynx clear of any exudate or lesions. Age-appropriate dentition. Hearing appropriate Neck: normal, supple, no masses, no thyromegaly Respiratory: clear to auscultation bilaterally, no wheezing, no crackles. Normal respiratory effort. No accessory muscle use.  Cardiovascular: Regular rate and rhythm, no murmurs / rubs / gallops. No extremity edema. 2+ pedal pulses. No carotid bruits.  Abdomen: no tenderness, no masses palpated, no hepatosplenomegaly. Bowel sounds positive.  Musculoskeletal: no clubbing / cyanosis. No joint deformity upper and lower extremities. Good ROM, no contractures, no atrophy. Normal muscle tone.  Skin: no rashes, lesions, ulcers. No induration Neurologic: Sensation intact. Strength 5/5 in all 4.  Psychiatric: Normal judgment and insight. Alert and oriented x 3. Normal mood.   EKG: independently reviewed, showing sinus rate of 61, QTc 442  Chest x-ray on Admission: I personally reviewed and I agree with radiologist reading as below.  CT HEAD WO CONTRAST (5MM)  Result Date: 11/19/2020 CLINICAL DATA:  Found down, left-sided scalp laceration, increasing confusion EXAM: CT HEAD WITHOUT CONTRAST TECHNIQUE: Contiguous axial images were obtained from the base of the skull through the vertex without intravenous contrast. COMPARISON:  07/31/2019 FINDINGS: Brain: There is increased attenuation within the left frontal lobe, consistent with small area of subarachnoid hemorrhage and possible contusion. Additionally, there is some faint attenuation within right frontal lobe sulci image 17 and image 23, consistent with subarachnoid hemorrhage. No acute infarct. Continued dilation of the  lateral ventricles out of proportion to the degree of cerebral atrophy. No mass effect. Vascular: No hyperdense vessel or unexpected calcification. Skull: Left frontal scalp laceration. No underlying fracture. The remainder of the calvarium is unremarkable. Sinuses/Orbits: Inspissated secretions are seen within the right maxillary and left sphenoid sinus, stable. Remaining sinuses are clear. Other: None. IMPRESSION: 1. Subarachnoid hemorrhage and small parenchymal contusion within the left frontal lobe, with trace subarachnoid hemorrhage along the right frontal  convexity. No mass effect or midline shift. 2. Left frontal scalp laceration.  No acute fracture. Critical Value/emergent results were called by telephone at the time of interpretation on 11/19/2020 at 4:29 pm to provider DR Tamala Julian, who verbally acknowledged these results. Electronically Signed   By: Randa Ngo M.D.   On: 11/19/2020 16:33   CT Cervical Spine Wo Contrast  Result Date: 11/19/2020 CLINICAL DATA:  [place], found down, left frontal scalp laceration EXAM: CT CERVICAL SPINE WITHOUT CONTRAST TECHNIQUE: Multidetector CT imaging of the cervical spine was performed without intravenous contrast. Multiplanar CT image reconstructions were also generated. COMPARISON:  None. FINDINGS: Alignment: Minimal anterolisthesis of C4 relative to C5 likely due to extensive degenerative change. Otherwise alignment is anatomic. Skull base and vertebrae: No acute fracture. No primary bone lesion or focal pathologic process. Soft tissues and spinal canal: No prevertebral fluid or swelling. No visible canal hematoma. Disc levels: There is extensive multilevel cervical spondylosis most pronounced at C4-5, C5-6, and C6-7. Prominent facet hypertrophy on the left at C4-5. Bony fusion across the craniocervical junction. Upper chest: Airway is patent. There are small bilateral pleural effusions, right greater than left. The lung apices are otherwise clear. Other:  Reconstructed images demonstrate no additional findings. IMPRESSION: 1. No acute cervical spine fracture. 2. Multilevel cervical spondylosis and facet hypertrophy. 3. Small bilateral pleural effusions, right greater than left. Electronically Signed   By: Randa Ngo M.D.   On: 11/19/2020 16:32   DG Pelvis Portable  Result Date: 11/19/2020 CLINICAL DATA:  Pelvic pain following fall EXAM: PORTABLE PELVIS 1-2 VIEWS COMPARISON:  None. FINDINGS: Normal alignment. No acute fracture or dislocation. Sacroiliac and hip joint spaces are preserved. Soft tissues are unremarkable. IMPRESSION: No acute fracture or dislocation. Electronically Signed   By: Fidela Salisbury M.D.   On: 11/19/2020 19:09   DG Chest Portable 1 View  Result Date: 11/19/2020 CLINICAL DATA:  CHF Worsening renal function Assess fluid volume EXAM: PORTABLE CHEST 1 VIEW COMPARISON:  02/18/2020 FINDINGS: Postsurgical changes of CABG again seen. There is borderline cardiomegaly. Mild pulmonary vascular congestion is seen. Mild bibasilar opacities likely due to atelectasis. IMPRESSION: Mild pulmonary vascular congestion. Electronically Signed   By: Miachel Roux M.D.   On: 11/19/2020 15:50    Labs on Admission: I have personally reviewed following labs  CBC: Recent Labs  Lab 11/19/20 1401  WBC 3.7*  HGB 10.3*  HCT 29.3*  MCV 92.4  PLT 62*   Basic Metabolic Panel: Recent Labs  Lab 11/19/20 1401  NA 135  K 4.4  CL 93*  CO2 33*  GLUCOSE 199*  BUN 45*  CREATININE 2.23*  CALCIUM 9.4   GFR: Estimated Creatinine Clearance: 17.4 mL/min (A) (by C-G formula based on SCr of 2.23 mg/dL (H)).  Liver Function Tests: Recent Labs  Lab 11/19/20 1526  AST 54*  ALT 33  ALKPHOS 99  BILITOT 3.5*  PROT 7.2  ALBUMIN 3.0*   No results for input(s): LIPASE, AMYLASE in the last 168 hours. Recent Labs  Lab 11/19/20 1526  AMMONIA 39*   Coagulation Profile: Recent Labs  Lab 11/19/20 1526  INR 1.2   Urine analysis:    Component  Value Date/Time   COLORURINE STRAW (A) 11/19/2020 1401   APPEARANCEUR CLEAR (A) 11/19/2020 1401   APPEARANCEUR Hazy 01/29/2014 2131   LABSPEC 1.006 11/19/2020 1401   LABSPEC 1.005 01/29/2014 2131   PHURINE 7.0 11/19/2020 1401   GLUCOSEU >=500 (A) 11/19/2020 1401   GLUCOSEU Negative 01/29/2014 2131  HGBUR MODERATE (A) 11/19/2020 1401   BILIRUBINUR NEGATIVE 11/19/2020 1401   BILIRUBINUR Negative 01/29/2014 2131   KETONESUR NEGATIVE 11/19/2020 1401   PROTEINUR NEGATIVE 11/19/2020 1401   NITRITE NEGATIVE 11/19/2020 1401   LEUKOCYTESUR NEGATIVE 11/19/2020 1401   LEUKOCYTESUR 3+ 01/29/2014 2131   Dr. Tobie Poet [facility]  If 7PM-7AM, please contact overnight-coverage provider If 7AM-7PM, please contact day coverage provider [email or web address]  11/19/2020, 8:14 PM

## 2020-11-19 NOTE — ED Provider Notes (Signed)
[facility] Emergency Department Provider Note ____________________________________________   Event Date/Time   First MD Initiated Contact with Patient 11/19/20 1454     (approximate)  I have reviewed the triage vital signs and the nursing notes.  HISTORY  Chief Complaint Fall   HPI Theresa [name] is a 80 y.o. femalewho presents to the ED for evaluation of fall.   Chart review indicates CAD s/p CABG, diastolic CHF, paroxysmal atrial flutter. Recent medical admission to [facility] for altered mentation, 8/15-8/19./CHF exacerbation where her metolazone was transitioned to spironolactone, continues on torsemide.  No anticoagulation.  NASH cirrhosis and hepatic encephalopathy.  CKD 3 with baseline creatinine of around 1.7-1.8.  Patient presents to the ED, accompanied by her son who is a local paramedic, for evaluation of fall, head injury and possible syncope.  Patient reports that she lives at home with her husband.  She reports today she was in the bathroom and then "I not know what happened."  She reports waking up on the floor of the bathroom.  She reports left-sided headache and head pain at the site of laceration and hematoma.  She denies additional pain or signs of injury to her extremities, back or neck.  Son reports that she has been a little more confused the past few days, but now she is largely at her baseline.   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   Sleep apnea    SOB (shortness of breath)    Stroke (HCC)    Temporal arteritis (HCC)    Thrombocytopenia (HCC)    Thrombocytopenia ([place])     Patient Active Problem List   Diagnosis Date Noted   Subarachnoid bleed ([name]) 11/19/2020   AKI (acute kidney injury) ([name]) 11/19/2020   CKD (chronic kidney disease), stage III ([name]) 11/19/2020   Acute on chronic diastolic (congestive) heart failure ([name]) 11/02/2019   Acute bronchitis 11/02/2019   Pleural effusion on right 11/02/2019   Type 2 diabetes mellitus with peripheral neuropathy ([name]) 09/26/2019   Acute CHF ([name]) 09/25/2019   Acute lower UTI    Syncope and collapse    GI bleeding 07/31/2019   Paroxysmal A-fib ([place]) 06/10/2019   Orthopnea 11/21/2016   CVA (cerebral vascular accident) ([place]) 04/22/2016   MGUS (monoclonal gammopathy of unknown significance) 10/14/2015   Cirrhosis ([name]) 10/14/2015   Cirrhosis, cryptogenic ([name]) 10/14/2015   Thrombocytopenia ([place]) 10/03/2014   Iron deficiency anemia due to chronic blood loss 10/03/2014   Benign essential hypertension 08/15/2014    Past Surgical History:  Procedure Laterality Date   COLONOSCOPY  10/24/2013, 2002   hyperplastic polpys   CORONARY ANGIOPLASTY     CORONARY ARTERY BYPASS GRAFT  09/1998   ESOPHAGOGASTRODUODENOSCOPY (EGD) WITH PROPOFOL N/A 08/26/2016   Procedure: ESOPHAGOGASTRODUODENOSCOPY (EGD) WITH PROPOFOL;  Surgeon: Manya Silvas, MD;  Location: [facility]  ENDOSCOPY;  Service: Endoscopy;  Laterality: N/A;   ESOPHAGOGASTRODUODENOSCOPY (EGD) WITH PROPOFOL N/A 02/05/2018   Procedure: ESOPHAGOGASTRODUODENOSCOPY (EGD) WITH PROPOFOL;  Surgeon: Manya Silvas, MD;  Location: [facility] ENDOSCOPY;  Service: Endoscopy;  Laterality: N/A;   ESOPHAGOGASTRODUODENOSCOPY (EGD) WITH PROPOFOL N/A 07/22/2019   Procedure: ESOPHAGOGASTRODUODENOSCOPY (EGD) WITH PROPOFOL;  Surgeon: [place], Benay Pike, MD;  Location: [facility] ENDOSCOPY;  Service: Gastroenterology;  Laterality:  N/A;   ESOPHAGOGASTRODUODENOSCOPY ENDOSCOPY  05/24/2011   hemorroid surgery     JOINT REPLACEMENT  2003  2005   bil.knees    Prior to Admission medications   Medication Sig Start Date End Date Taking? Authorizing Provider  alendronate (FOSAMAX) 70 MG tablet Take 70 mg by mouth every Monday. Take with a full glass of water on an empty stomach.   Yes [provider]  Calcium Carbonate-Vitamin D 600-200 MG-UNIT TABS Take 1 tablet by mouth daily.   Yes [provider]  CINNAMON PO Take by mouth.   Yes [provider]  Cranberry-Milk Thistle 250-75 MG CAPS Take 1 capsule by mouth 3 (three) times daily.    Yes [provider]  dapagliflozin propanediol (FARXIGA) 10 MG TABS tablet Take 10 mg by mouth daily.   Yes [provider]  ferrous sulfate 325 (65 FE) MG tablet Take 325 mg by mouth daily.    Yes [provider]  gabapentin (NEURONTIN) 100 MG capsule Take 200 mg by mouth at bedtime.  01/26/15  Yes [provider]  levothyroxine (SYNTHROID, LEVOTHROID) 50 MCG tablet Take 50 mcg by mouth daily before breakfast.  03/31/15  Yes [provider]  Melatonin 10 MG CAPS Take 10 mg by mouth at bedtime.    Yes [provider]  Menthol-Camphor (ICY HOT ADVANCED PAIN RELIEF) 16-11 % CREA Apply 1 application topically as directed.   Yes [provider]  metoprolol succinate (TOPROL-XL) 25 MG 24 hr tablet Take 12.5 mg by mouth daily. 11/13/20  Yes [provider]  omeprazole (PRILOSEC) 20 MG capsule Take 20 mg by mouth daily.  03/24/15  Yes [provider]  potassium chloride SA (K-DUR,KLOR-CON) 20 MEQ tablet Take 1 tablet (20 mEq total) by mouth 2 (two) times daily. Patient taking differently: Take 20 mEq by mouth daily. 11/22/16  Yes Cammie Sickle, MD  rifaximin (XIFAXAN) 550 MG TABS tablet Take 550 mg by mouth 2 (two) times daily.  01/20/15  Yes [provider]  simvastatin (ZOCOR) 20 MG  tablet Take 20 mg by mouth at bedtime.  03/24/15  Yes [provider]  spironolactone (ALDACTONE) 50 MG tablet Take 50 mg by mouth daily. 11/13/20  Yes [provider]  torsemide (DEMADEX) 20 MG tablet Take 20 mg by mouth 2 (two) times daily.   Yes [provider]  blood glucose meter kit and supplies KIT Dispense based on patient and insurance preference. Use up to four times daily as directed. (FOR ICD-9 250.00, 250.01). 04/24/16   Gladstone Lighter, MD  glipiZIDE (GLUCOTROL XL) 2.5 MG 24 hr tablet Take 2.5 mg by mouth daily with breakfast. Patient not taking: No sig reported    [provider]  metFORMIN (GLUCOPHAGE) 500 MG tablet Take 500 mg by mouth 2 (two) times daily with a meal. Patient not taking: No sig reported    [provider]  metolazone (ZAROXOLYN) 2.5 MG tablet Take 2.5 mg by mouth once a week. Patient not taking: Reported on 11/19/2020    [provider]  metoprolol tartrate (LOPRESSOR) 25  MG tablet Take 25 mg by mouth in the morning and at bedtime. Patient not taking: Reported on 11/19/2020 07/22/19   [provider]    Allergies Tape, Morphine and related, and Other  Family History  Problem Relation Age of Onset   Heart disease Other    Hypertension Other    Anemia Other    Colon cancer Other    CVA Mother    Dementia Mother    CAD Father    Breast cancer Neg Hx     Social History Social History   Tobacco Use   Smoking status: Never   Smokeless tobacco: Never  Vaping Use   Vaping Use: Never used  Substance Use Topics   Alcohol use: No    Alcohol/week: 0.0 standard drinks   Drug use: No    Review of Systems  Constitutional: No fever/chills Eyes: No visual changes. ENT: No sore throat. Cardiovascular: Denies chest pain. Respiratory: Denies shortness of breath. Gastrointestinal: No abdominal pain.  No nausea, no vomiting.  No diarrhea.  No constipation. Genitourinary: Negative for  dysuria. Musculoskeletal: Negative for back pain. Skin: Negative for rash. Neurological: Negative for  focal weakness or numbness.  Positive for posttraumatic headache and possible syncope.  ____________________________________________   PHYSICAL EXAM:  VITAL SIGNS: Vitals:   11/19/20 2005 11/19/20 2312  BP: (!) 99/53 (!) 99/43  Pulse: 64 70  Resp: 18 18  Temp: 98.1 F (36.7 C) 98.8 F (37.1 C)  SpO2: 98% 93%     Constitutional: Alert and oriented. Well appearing and in no acute distress. Eyes: Conjunctivae are normal. PERRL. EOMI. Head: Contusion and hematoma to left-sided forehead.  Associated 3 lacerations that are clustered together.  No periorbital step-offs or signs of EOM entrapment. Nose: No congestion/rhinnorhea. Mouth/Throat: Mucous membranes are moist.  Oropharynx non-erythematous. Neck: No stridor. No cervical spine tenderness to palpation. Cardiovascular: Normal rate, regular rhythm. Grossly normal heart sounds.  Good peripheral circulation. Respiratory: Normal respiratory effort.  No retractions. Lungs CTAB. Gastrointestinal: Soft , nondistended, nontender to palpation. No CVA tenderness. Musculoskeletal: No lower extremity tenderness nor edema.  No joint effusions. No signs of acute trauma. Neurologic: Strength and sensation intact throughout.  Cranial nerves intact. Skin:  Skin is warm, dry and intact. No rash noted. Psychiatric: Mood and affect are normal. Speech and behavior are normal.  ____________________________________________   LABS (all labs ordered are listed, but only abnormal results are displayed)  Labs Reviewed  BASIC METABOLIC PANEL - Abnormal; Notable for the following components:      Result Value   Chloride 93 (*)    CO2 33 (*)    Glucose, Bld 199 (*)    BUN 45 (*)    Creatinine, Ser 2.23 (*)    GFR, Estimated 21 (*)    All other components within normal limits  CBC - Abnormal; Notable for the following components:   WBC 3.7 (*)     RBC 3.17 (*)    Hemoglobin 10.3 (*)    HCT 29.3 (*)    RDW 17.3 (*)    Platelets 62 (*)    All other components within normal limits  URINALYSIS, COMPLETE (UACMP) WITH MICROSCOPIC - Abnormal; Notable for the following components:   Color, Urine STRAW (*)    APPearance CLEAR (*)    Glucose, UA >=500 (*)    Hgb urine dipstick MODERATE (*)    All other components within normal limits  HEPATIC FUNCTION PANEL - Abnormal; Notable for the following components:  Albumin 3.0 (*)    AST 54 (*)    Total Bilirubin 3.5 (*)    Bilirubin, Direct 0.8 (*)    Indirect Bilirubin 2.7 (*)    All other components within normal limits  AMMONIA - Abnormal; Notable for the following components:   Ammonia 39 (*)    All other components within normal limits  PROTIME-INR - Abnormal; Notable for the following components:   Prothrombin Time 15.4 (*)    All other components within normal limits  RESP PANEL BY RT-PCR (FLU A&B, COVID) [id number]  PROCALCITONIN  BASIC METABOLIC PANEL  CBC  TSH  VITAMIN B12  CBG MONITORING, ED   ____________________________________________  12 Lead EKG  Sinus rhythm, rate of 61 bpm.  Mild first-degree AV block with PR interval of 212 ms, otherwise normal intervals.  No STEMI. ____________________________________________  RADIOLOGY  ED MD interpretation: CT head reviewed by me with SAH and small left frontal parenchymal contusion.  No midline shift.  Official radiology report(s): CT HEAD WO CONTRAST (5MM)  Result Date: 11/19/2020 CLINICAL DATA:  Follow-up subarachnoid hemorrhage EXAM: CT HEAD WITHOUT CONTRAST TECHNIQUE: Contiguous axial images were obtained from the base of the skull through the vertex without intravenous contrast. COMPARISON:  CT from earlier in the same day. FINDINGS: Brain: Persistent left frontal subarachnoid hemorrhage is identified and stable. The right-sided frontal subarachnoid hemorrhage is not well appreciated on this exam. There is increasing  attenuation along the septum pellucidum to the left of the midline consistent with focal hemorrhage as well as a small amount of intraventricular blood new from the prior study. No other subarachnoid hemorrhage is noted. Persistent ventricular dilatation is seen. Vascular: No hyperdense vessel or unexpected calcification. Skull: Normal. Negative for fracture or focal lesion. Sinuses/Orbits: No acute finding. Other: None. IMPRESSION: Persistent left frontal subarachnoid hemorrhage is noted and stable. Previously seen right frontal subarachnoid hemorrhage has resolved. There is a new area of hemorrhage along the septum pellucidum to the left of the midline with evidence of pooling of intraventricular blood in the occipital horn of the left lateral ventricle. This area of hemorrhage measures up to 13 mm in dimension. Follow-up is recommended. Critical Value/emergent results were called by telephone at the time of interpretation on 11/19/2020 at 10:40 pm to [facility] the patient's nurse, who verbally acknowledged these results and will relay to Dr. Tobie Poet or other attending physician. Electronically Signed   By: Inez Catalina M.D.   On: 11/19/2020 22:40   CT HEAD WO CONTRAST (5MM)  Result Date: 11/19/2020 CLINICAL DATA:  Found down, left-sided scalp laceration, increasing confusion EXAM: CT HEAD WITHOUT CONTRAST TECHNIQUE: Contiguous axial images were obtained from the base of the skull through the vertex without intravenous contrast. COMPARISON:  07/31/2019 FINDINGS: Brain: There is increased attenuation within the left frontal lobe, consistent with small area of subarachnoid hemorrhage and possible contusion. Additionally, there is some faint attenuation within right frontal lobe sulci image 17 and image 23, consistent with subarachnoid hemorrhage. No acute infarct. Continued dilation of the lateral ventricles out of proportion to the degree of cerebral atrophy. No mass effect. Vascular: No hyperdense vessel or unexpected  calcification. Skull: Left frontal scalp laceration. No underlying fracture. The remainder of the calvarium is unremarkable. Sinuses/Orbits: Inspissated secretions are seen within the right maxillary and left sphenoid sinus, stable. Remaining sinuses are clear. Other: None. IMPRESSION: 1. Subarachnoid hemorrhage and small parenchymal contusion within the left frontal lobe, with trace subarachnoid hemorrhage along the right frontal convexity. No mass effect or  midline shift. 2. Left frontal scalp laceration.  No acute fracture. Critical Value/emergent results were called by telephone at the time of interpretation on 11/19/2020 at 4:29 pm to provider DR Tamala Julian, who verbally acknowledged these results. Electronically Signed   By: Randa Ngo M.D.   On: 11/19/2020 16:33   CT Cervical Spine Wo Contrast  Result Date: 11/19/2020 CLINICAL DATA:  [place], found down, left frontal scalp laceration EXAM: CT CERVICAL SPINE WITHOUT CONTRAST TECHNIQUE: Multidetector CT imaging of the cervical spine was performed without intravenous contrast. Multiplanar CT image reconstructions were also generated. COMPARISON:  None. FINDINGS: Alignment: Minimal anterolisthesis of C4 relative to C5 likely due to extensive degenerative change. Otherwise alignment is anatomic. Skull base and vertebrae: No acute fracture. No primary bone lesion or focal pathologic process. Soft tissues and spinal canal: No prevertebral fluid or swelling. No visible canal hematoma. Disc levels: There is extensive multilevel cervical spondylosis most pronounced at C4-5, C5-6, and C6-7. Prominent facet hypertrophy on the left at C4-5. Bony fusion across the craniocervical junction. Upper chest: Airway is patent. There are small bilateral pleural effusions, right greater than left. The lung apices are otherwise clear. Other: Reconstructed images demonstrate no additional findings. IMPRESSION: 1. No acute cervical spine fracture. 2. Multilevel cervical spondylosis and  facet hypertrophy. 3. Small bilateral pleural effusions, right greater than left. Electronically Signed   By: Randa Ngo M.D.   On: 11/19/2020 16:32   DG Pelvis Portable  Result Date: 11/19/2020 CLINICAL DATA:  Pelvic pain following fall EXAM: PORTABLE PELVIS 1-2 VIEWS COMPARISON:  None. FINDINGS: Normal alignment. No acute fracture or dislocation. Sacroiliac and hip joint spaces are preserved. Soft tissues are unremarkable. IMPRESSION: No acute fracture or dislocation. Electronically Signed   By: Fidela Salisbury M.D.   On: 11/19/2020 19:09   DG Chest Portable 1 View  Result Date: 11/19/2020 CLINICAL DATA:  CHF Worsening renal function Assess fluid volume EXAM: PORTABLE CHEST 1 VIEW COMPARISON:  02/18/2020 FINDINGS: Postsurgical changes of CABG again seen. There is borderline cardiomegaly. Mild pulmonary vascular congestion is seen. Mild bibasilar opacities likely due to atelectasis. IMPRESSION: Mild pulmonary vascular congestion. Electronically Signed   By: Miachel Roux M.D.   On: 11/19/2020 15:50    ____________________________________________   PROCEDURES and INTERVENTIONS  Procedure(s) performed (including Critical Care):  .1-3 Lead EKG Interpretation  Date/Time: 11/19/2020 11:45 PM Performed by: Vladimir Crofts, MD Authorized by: Vladimir Crofts, MD     Interpretation: normal     ECG rate:  70   ECG rate assessment: normal     Rhythm: sinus rhythm     Ectopy: none     Conduction: normal   .Critical Care  Date/Time: 11/19/2020 11:46 PM Performed by: Vladimir Crofts, MD Authorized by: Vladimir Crofts, MD   Critical care provider statement:    Critical care time (minutes):  45   Critical care was necessary to treat or prevent imminent or life-threatening deterioration of the following conditions:  CNS failure or compromise   Critical care was time spent personally by me on the following activities:  Discussions with consultants, evaluation of patient's response to treatment, examination  of patient, ordering and performing treatments and interventions, ordering and review of laboratory studies, ordering and review of radiographic studies, pulse oximetry, re-evaluation of patient's condition, obtaining history from patient or surrogate and review of old charts .Marland KitchenLaceration Repair  Date/Time: 11/19/2020 11:46 PM Performed by: Vladimir Crofts, MD Authorized by: Vladimir Crofts, MD   Consent:    Consent obtained:  Verbal  Consent given by:  Patient (Son)   Risks, benefits, and alternatives were discussed: yes     Risks discussed:  Infection, pain, need for additional repair and poor cosmetic result   Alternatives discussed:  No treatment Anesthesia:    Anesthesia method:  Local infiltration   Local anesthetic:  Lidocaine 1% WITH epi Laceration details:    Location:  Scalp   Scalp location:  Frontal (3 small lacerations clustered together to left-sided forehead)   Wound length (cm): 1 cm, 2 cm and 3 cm. Exploration:    Hemostasis achieved with:  Direct pressure and epinephrine   Imaging obtained comment:  CT   Imaging outcome: foreign body not noted     Contaminated: no   Treatment:    Area cleansed with:  Povidone-iodine   Amount of cleaning:  Standard   Irrigation solution:  Sterile saline   Irrigation method:  Pressure wash   Visualized foreign bodies/material removed: no     Debridement:  None   Undermining:  None   Scar revision: no   Skin repair:    Repair method:  Sutures   Suture size:  5-0   Suture material:  Nylon   Suture technique:  Simple interrupted   Number of sutures:  6 (3 sutures to 1 laceration, 2 sutures to another and 1 suture for the final laceration.) Approximation:    Approximation:  Close Repair type:    Repair type:  Simple Post-procedure details:    Dressing:  Open (no dressing)   Procedure completion:  Tolerated well, no immediate complications  Medications  rifaximin (XIFAXAN) tablet 550 mg (550 mg Oral Given 11/19/20 2208)   simvastatin (ZOCOR) tablet 20 mg (20 mg Oral Given 11/19/20 2208)  levothyroxine (SYNTHROID) tablet 50 mcg (has no administration in time range)  acetaminophen (TYLENOL) tablet 650 mg (650 mg Oral Given 11/19/20 2332)    Or  acetaminophen (TYLENOL) suppository 650 mg ( Rectal See Alternative 11/19/20 2332)  ondansetron (ZOFRAN) tablet 4 mg (has no administration in time range)    Or  ondansetron (ZOFRAN) injection 4 mg (has no administration in time range)  hydrALAZINE (APRESOLINE) injection 5 mg (has no administration in time range)  polyethylene glycol (MIRALAX / GLYCOLAX) packet 17 g (17 g Oral Given 11/19/20 2208)  lactulose (CHRONULAC) 10 GM/15ML solution 10 g (has no administration in time range)  lidocaine-EPINEPHrine (XYLOCAINE W/EPI) 2 %-1:100000 (with pres) injection 20 mL (20 mLs Infiltration Given 11/19/20 1621)    ____________________________________________   MDM / ED COURSE  80 year old woman with [place] cirrhosis, but no anticoagulation, presents to the ED after a fall with possible syncope, found to have [facility] requiring medical admission for monitoring.  Reassuring neurologic examination with evidence of deficits.  No signs of significant trauma to the extremities or back to warrant additional imaging beyond her head imaging.  Blood work with chronic liver failure.  Ammonia slightly elevated, but improved from her recent admission.  CT imaging with SAH and small parenchymal contusion without midline shift.  Discussed with neurosurgery, who recommends repeat CT in 6 hours.  We will admit medically due to her syncopal episode and for further monitoring.   Clinical Course as of 11/19/20 2345  Thu Nov 19, 2020  1543 Reviewed plan of care with pt and son. All are in agreement [DS]  1604 Reviewed CT scan.  Question small area of left frontal intraparenchymal bleed [DS]  1628 Call from rads with results [DS]  1572 I discussed with neurosurgery, Dr. Cari Caraway,  who recommends repeat CT  scan in 6 hours and would be suitable to keep here. [DS]  1800 Lac repair complete. 6 sutures across 3 sites. 3-2-1 [DS]    Clinical Course User Index [DS] Vladimir Crofts, MD    ____________________________________________   FINAL CLINICAL IMPRESSION(S) / ED DIAGNOSES  Final diagnoses:  Fall, initial encounter  Injury of head, initial encounter  SAH (subarachnoid hemorrhage) ([place])  Laceration of forehead, initial encounter     ED Discharge Orders     None        Jameisha Stofko Tamala Julian   Note:  This document was prepared using Dragon voice recognition software and may include unintentional dictation errors.    Vladimir Crofts, MD 11/19/20 [phone]

## 2020-11-19 NOTE — ED Triage Notes (Addendum)
Pt via EMS from home. Pt had a fall today around 45-1 hour PTA. Pt was found on the bathroom floor by her in home PT. Pt does not remember the fall. Does not remember how she felt before the fall. Pt has a 2 inch laceration to the L anterior aspect of her head. Per son, pt has been more confused lately. Pt is not currently on any blood thinner. Pt is now A&Ox4 and NAD.

## 2020-11-20 ENCOUNTER — Observation Stay: Payer: Medicare Other

## 2020-11-20 DIAGNOSIS — J45909 Unspecified asthma, uncomplicated: Secondary | ICD-10-CM | POA: Diagnosis present

## 2020-11-20 DIAGNOSIS — K7581 Nonalcoholic steatohepatitis (NASH): Secondary | ICD-10-CM | POA: Diagnosis present

## 2020-11-20 DIAGNOSIS — D696 Thrombocytopenia, unspecified: Secondary | ICD-10-CM | POA: Diagnosis present

## 2020-11-20 DIAGNOSIS — D472 Monoclonal gammopathy: Secondary | ICD-10-CM | POA: Diagnosis present

## 2020-11-20 DIAGNOSIS — E1151 Type 2 diabetes mellitus with diabetic peripheral angiopathy without gangrene: Secondary | ICD-10-CM | POA: Diagnosis present

## 2020-11-20 DIAGNOSIS — S0101XA Laceration without foreign body of scalp, initial encounter: Secondary | ICD-10-CM | POA: Diagnosis present

## 2020-11-20 DIAGNOSIS — Z20822 Contact with and (suspected) exposure to covid-19: Secondary | ICD-10-CM | POA: Diagnosis present

## 2020-11-20 DIAGNOSIS — I609 Nontraumatic subarachnoid hemorrhage, unspecified: Secondary | ICD-10-CM | POA: Diagnosis not present

## 2020-11-20 DIAGNOSIS — I5042 Chronic combined systolic (congestive) and diastolic (congestive) heart failure: Secondary | ICD-10-CM | POA: Diagnosis present

## 2020-11-20 DIAGNOSIS — E1142 Type 2 diabetes mellitus with diabetic polyneuropathy: Secondary | ICD-10-CM | POA: Diagnosis present

## 2020-11-20 DIAGNOSIS — N179 Acute kidney failure, unspecified: Secondary | ICD-10-CM

## 2020-11-20 DIAGNOSIS — N1832 Chronic kidney disease, stage 3b: Secondary | ICD-10-CM | POA: Diagnosis present

## 2020-11-20 DIAGNOSIS — W19XXXA Unspecified fall, initial encounter: Secondary | ICD-10-CM

## 2020-11-20 DIAGNOSIS — S066X9A Traumatic subarachnoid hemorrhage with loss of consciousness of unspecified duration, initial encounter: Secondary | ICD-10-CM | POA: Diagnosis present

## 2020-11-20 DIAGNOSIS — I2510 Atherosclerotic heart disease of native coronary artery without angina pectoris: Secondary | ICD-10-CM | POA: Diagnosis present

## 2020-11-20 DIAGNOSIS — I959 Hypotension, unspecified: Secondary | ICD-10-CM | POA: Diagnosis present

## 2020-11-20 DIAGNOSIS — E039 Hypothyroidism, unspecified: Secondary | ICD-10-CM | POA: Diagnosis present

## 2020-11-20 DIAGNOSIS — I130 Hypertensive heart and chronic kidney disease with heart failure and stage 1 through stage 4 chronic kidney disease, or unspecified chronic kidney disease: Secondary | ICD-10-CM | POA: Diagnosis present

## 2020-11-20 DIAGNOSIS — K7290 Hepatic failure, unspecified without coma: Secondary | ICD-10-CM | POA: Diagnosis present

## 2020-11-20 DIAGNOSIS — M25559 Pain in unspecified hip: Secondary | ICD-10-CM | POA: Diagnosis present

## 2020-11-20 DIAGNOSIS — I4892 Unspecified atrial flutter: Secondary | ICD-10-CM | POA: Diagnosis present

## 2020-11-20 DIAGNOSIS — I272 Pulmonary hypertension, unspecified: Secondary | ICD-10-CM | POA: Diagnosis present

## 2020-11-20 DIAGNOSIS — I071 Rheumatic tricuspid insufficiency: Secondary | ICD-10-CM | POA: Diagnosis present

## 2020-11-20 DIAGNOSIS — W1830XA Fall on same level, unspecified, initial encounter: Secondary | ICD-10-CM | POA: Diagnosis present

## 2020-11-20 DIAGNOSIS — Y92002 Bathroom of unspecified non-institutional (private) residence single-family (private) house as the place of occurrence of the external cause: Secondary | ICD-10-CM | POA: Diagnosis not present

## 2020-11-20 DIAGNOSIS — K766 Portal hypertension: Secondary | ICD-10-CM | POA: Diagnosis present

## 2020-11-20 DIAGNOSIS — K219 Gastro-esophageal reflux disease without esophagitis: Secondary | ICD-10-CM | POA: Diagnosis present

## 2020-11-20 DIAGNOSIS — E1122 Type 2 diabetes mellitus with diabetic chronic kidney disease: Secondary | ICD-10-CM | POA: Diagnosis present

## 2020-11-20 DIAGNOSIS — M25551 Pain in right hip: Secondary | ICD-10-CM | POA: Diagnosis present

## 2020-11-20 LAB — BASIC METABOLIC PANEL
Anion gap: 8 (ref 5–15)
BUN: 48 mg/dL — ABNORMAL HIGH (ref 8–23)
CO2: 32 mmol/L (ref 22–32)
Calcium: 8.4 mg/dL — ABNORMAL LOW (ref 8.9–10.3)
Chloride: 96 mmol/L — ABNORMAL LOW (ref 98–111)
Creatinine, Ser: 1.98 mg/dL — ABNORMAL HIGH (ref 0.44–1.00)
GFR, Estimated: 25 mL/min — ABNORMAL LOW (ref >60)
Glucose, Bld: 109 mg/dL — ABNORMAL HIGH (ref 70–99)
Potassium: 4 mmol/L (ref 3.5–5.1)
Sodium: 136 mmol/L (ref 135–145)

## 2020-11-20 LAB — GLUCOSE, CAPILLARY
Glucose-Capillary: 110 mg/dL — ABNORMAL HIGH (ref 70–99)
Glucose-Capillary: 219 mg/dL — ABNORMAL HIGH (ref 70–99)
Glucose-Capillary: 236 mg/dL — ABNORMAL HIGH (ref 70–99)

## 2020-11-20 LAB — VITAMIN B12: Vitamin B-12: 956 pg/mL — ABNORMAL HIGH (ref 180–914)

## 2020-11-20 LAB — CBC
HCT: 28.5 % — ABNORMAL LOW (ref 36.0–46.0)
Hemoglobin: 9.7 g/dL — ABNORMAL LOW (ref 12.0–15.0)
MCH: 31.2 pg (ref 26.0–34.0)
MCHC: 34 g/dL (ref 30.0–36.0)
MCV: 91.6 fL (ref 80.0–100.0)
Platelets: 68 10*3/uL — ABNORMAL LOW (ref 150–400)
RBC: 3.11 MIL/uL — ABNORMAL LOW (ref 3.87–5.11)
RDW: 17.2 % — ABNORMAL HIGH (ref 11.5–15.5)
WBC: 4 10*3/uL (ref 4.0–10.5)
nRBC: 0 % (ref 0.0–0.2)

## 2020-11-20 LAB — TSH: TSH: 2.319 u[IU]/mL (ref 0.350–4.500)

## 2020-11-20 MED ORDER — SODIUM CHLORIDE 0.9 % IV SOLN: INTRAVENOUS | Status: DC

## 2020-11-20 MED ORDER — TORSEMIDE 20 MG PO TABS: 20.0000 mg | ORAL_TABLET | Freq: Two times a day (BID) | ORAL | Status: DC

## 2020-11-20 MED ORDER — METOPROLOL SUCCINATE ER 25 MG PO TB24: 12.5000 mg | ORAL_TABLET | Freq: Every day | ORAL | Status: DC

## 2020-11-20 MED ORDER — PANTOPRAZOLE SODIUM 40 MG PO TBEC
40.0000 mg | DELAYED_RELEASE_TABLET | Freq: Every day | ORAL | Status: DC
  Administered 2020-11-20 – 2020-11-21 (×2): 40 mg via ORAL
  Filled 2020-11-20 (×2): qty 1

## 2020-11-20 MED ORDER — SPIRONOLACTONE 25 MG PO TABS
50.0000 mg | ORAL_TABLET | Freq: Every day | ORAL | Status: DC
  Administered 2020-11-20: 50 mg via ORAL
  Filled 2020-11-20 (×2): qty 2

## 2020-11-20 MED ORDER — DAPAGLIFLOZIN PROPANEDIOL 5 MG PO TABS
10.0000 mg | ORAL_TABLET | Freq: Every day | ORAL | Status: DC
  Administered 2020-11-21: 10 mg via ORAL
  Filled 2020-11-20 (×2): qty 2

## 2020-11-20 MED ORDER — GABAPENTIN 100 MG PO CAPS
200.0000 mg | ORAL_CAPSULE | Freq: Every day | ORAL | Status: DC
  Administered 2020-11-20: 200 mg via ORAL
  Filled 2020-11-20: qty 2

## 2020-11-20 MED ORDER — MELATONIN 5 MG PO TABS
10.0000 mg | ORAL_TABLET | Freq: Every day | ORAL | Status: DC
  Administered 2020-11-20: 10 mg via ORAL
  Filled 2020-11-20: qty 2

## 2020-11-20 MED ORDER — POTASSIUM CHLORIDE CRYS ER 20 MEQ PO TBCR: 20.0000 meq | EXTENDED_RELEASE_TABLET | Freq: Every day | ORAL | Status: DC

## 2020-11-20 MED ORDER — CALCIUM CARBONATE-VITAMIN D 500-200 MG-UNIT PO TABS
1.0000 | ORAL_TABLET | Freq: Every day | ORAL | Status: DC
  Administered 2020-11-20 – 2020-11-21 (×2): 1 via ORAL
  Filled 2020-11-20 (×2): qty 1

## 2020-11-20 MED ORDER — FERROUS SULFATE 325 (65 FE) MG PO TABS
325.0000 mg | ORAL_TABLET | Freq: Every day | ORAL | Status: DC
  Administered 2020-11-20 – 2020-11-21 (×2): 325 mg via ORAL
  Filled 2020-11-20 (×2): qty 1

## 2020-11-20 MED ORDER — SODIUM CHLORIDE 0.9 % IV BOLUS
500.0000 mL | Freq: Once | INTRAVENOUS | Status: AC
  Administered 2020-11-20: 500 mL via INTRAVENOUS

## 2020-11-20 MED ORDER — SODIUM CHLORIDE 0.9 % IV SOLN: Freq: Once | INTRAVENOUS | Status: AC

## 2020-11-20 NOTE — Evaluation (Signed)
Occupational Therapy Evaluation Patient Details Name: Theresa [name] MRN: [id number] DOB: 09/08/1940 Today's Date: 11/20/2020    History of Present Illness Theresa [name] is a 80 y.o. female with medical history significant for paroxysmal atrial fibrillation, history of CAD status post CABG, heart failure preserved ejection fraction, NYHA class III, hypertension, hypothyroid, bilateral carotid artery stenosis, hyperlipidemia, presents emergency department for chief concerns of a fall and head trauma. Head CT 11/19/20: Subarachnoid hemorrhage and small parenchymal contusion within the left frontal lobe, with trace subarachnoid hemorrhage along the right frontal convexity. Head CT 11/19/20: There is a new area of hemorrhage along the septum pellucidum to the left of the midline with evidence of pooling of intraventricular blood in the occipital horn of the left lateral ventricle.   Clinical Impression   Ms Gradillas was seen for OT evaluation this date. Prior to hospital admission, pt was MOD I for mobility and ADLs c 4WW. Pt lives with husband in split level home c level entrance and 3 steps to bedroom. Pt presents to acute OT demonstrating impaired ADL performance and functional mobility 2/2 decreased activity tolerance, poor safety awareness, and functional strength/ROM/balance deficits. Pt currently requires MOD A don B socks at bed level. MIN A for bed mobility, pt reports dizziness in sitting. CGA + RW for toilet t/f. MIN A + single UE support dynamic standing reaching outside BOS 2/2 anterior lean.   Husband and son at bedside t/o session, extensive education given re: DME recs, d/c recs, falls prevention, home/routines modifications, and AE recs. Pt would benefit from skilled OT to address noted impairments to maximize safety and independence while minimizing falls risk and caregiver burden. Upon hospital discharge, recommend [place] c 24/7 SUPERVISION to maximize pt safety and return to functional  independence during meaningful occupations of daily life.  SEATED: 119/48 (69) HR 62 bpm STANDING: 115/59 (76) HR 68 STANDING at 3: 120/59 (77) HR 68    Follow Up Recommendations  Home health OT;Supervision/Assistance - 24 hour    Equipment Recommendations  3 in 1 bedside commode    Recommendations for Other Services       Precautions / Restrictions Precautions Precautions: Fall Restrictions Weight Bearing Restrictions: No      Mobility Bed Mobility Overal bed mobility: Needs Assistance Bed Mobility: Supine to Sit     Supine to sit: Min assist          Transfers Overall transfer level: Needs assistance Equipment used: Rolling walker (2 wheeled) Transfers: Sit to/from Stand Sit to Stand: Min guard         General transfer comment: increased time to perform on her own    Balance Overall balance assessment: Needs assistance Sitting-balance support: No upper extremity supported;Feet supported Sitting balance-Leahy Scale: Fair     Standing balance support: Single extremity supported;During functional activity Standing balance-Leahy Scale: Poor Standing balance comment: MIN A for anterior lean                           ADL either performed or assessed with clinical judgement   ADL Overall ADL's : Needs assistance/impaired                                       General ADL Comments: MOD A don B socks at bed level. CGA + RW for toilet t/f. MIN A + single UE support dynamic  standing reaching outside BOS      Pertinent Vitals/Pain Pain Assessment: Faces Faces Pain Scale: Hurts little more Pain Location: headache Pain Descriptors / Indicators: Headache Pain Intervention(s): Limited activity within patient's tolerance;Repositioned     Hand Dominance     Extremity/Trunk Assessment Upper Extremity Assessment Upper Extremity Assessment: Generalized weakness (hx of CVA c RUE weakness)   Lower Extremity Assessment Lower  Extremity Assessment: Generalized weakness       Communication Communication Communication: No difficulties   Cognition Arousal/Alertness: Awake/alert;Lethargic Behavior During Therapy: WFL for tasks assessed/performed;Flat affect Overall Cognitive Status: Within Functional Limits for tasks assessed                                     General Comments  reports lightheaded in sitting. SEATED: 119/48 (69) HR 62 bpm. Standing: 115/59 (76) HR 68. STANDING at 3: 120/59 (77) HR 68    Exercises Exercises: Other exercises Other Exercises Other Exercises: Pt and family educated re: OT role, DME recs, d/c recs, falls prevention, home/routines modifications, AE recs Other Exercises: LBD, toileting, sup>sit, sit<>stand x2, sitting/standing balance/tolerance, ~50 ft mobility   Shoulder Instructions      Home Living Family/patient expects to be discharged to:: Private residence Living Arrangements: Spouse/significant other Available Help at Discharge: Family;Available PRN/intermittently (near 24/7) Type of Home: House Home Access: Level entry     Home Layout: Other (Comment) (split level - 3STE to bed room no rails)     Bathroom Shower/Tub: Walk-in shower         Home Equipment: Environmental consultant - 2 wheels;Walker - 4 wheels;Cane - single point;Toilet riser          Prior Functioning/Environment Level of Independence: Independent with assistive device(s)        Comments: MOD I using 4WW, reports 2 falls in last 6 months including 1 leading to admission. Per family pt requires PRN assist with bed mobility and donning compression stockings. Pt frequently forgets to call for assistance        OT Problem List: Decreased strength;Decreased activity tolerance;Impaired balance (sitting and/or standing);Decreased safety awareness;Decreased knowledge of use of DME or AE      OT Treatment/Interventions: Self-care/ADL training;Therapeutic exercise;Energy conservation;DME and/or AE  instruction;Therapeutic activities;Patient/family education;Balance training    OT Goals(Current goals can be found in the care plan section) Acute Rehab OT Goals Patient Stated Goal: to go home OT Goal Formulation: With patient/family Time For Goal Achievement: 12/04/20 Potential to Achieve Goals: Fair ADL Goals Pt Will Perform Grooming: with modified independence;standing (c LRAD PRN) Pt Will Perform Lower Body Dressing: with modified independence;sit to/from stand (c LRAD PRN) Pt Will Transfer to Toilet: with modified independence;ambulating;regular height toilet (c LRAD PRN)  OT Frequency: Min 2X/week   Barriers to D/C:            Co-evaluation PT/OT/SLP Co-Evaluation/Treatment: Yes Reason for Co-Treatment: For patient/therapist safety;To address functional/ADL transfers PT goals addressed during session: Mobility/safety with mobility;Proper use of DME OT goals addressed during session: ADL's and self-care      AM-PAC OT "6 Clicks" Daily Activity     Outcome Measure Help from another person eating meals?: None Help from another person taking care of personal grooming?: A Little Help from another person toileting, which includes using toliet, bedpan, or urinal?: A Little Help from another person bathing (including washing, rinsing, drying)?: A Little Help from another person to put on and taking off regular  upper body clothing?: A Little Help from another person to put on and taking off regular lower body clothing?: A Lot 6 Click Score: 18   End of Session Equipment Utilized During Treatment: Rolling walker  Activity Tolerance: Patient tolerated treatment well Patient left: in chair;with call bell/phone within reach;with chair alarm set;with family/visitor present  OT Visit Diagnosis: Other abnormalities of gait and mobility (R26.89);Muscle weakness (generalized) (M62.81)                Time: 3300-7622 OT Time Calculation (min): 39 min Charges:  OT General Charges $OT  Visit: 1 Visit OT Evaluation $OT Eval Moderate Complexity: 1 Mod OT Treatments $Self Care/Home Management : 8-22 mins  Dessie Coma, M.S. OTR/L  11/20/20, 3:50 PM  ascom [phone]

## 2020-11-20 NOTE — Evaluation (Signed)
Physical Therapy Evaluation Patient Details Name: Theresa [name] MRN: [id number] DOB: 1941/02/25 Today's Date: 11/20/2020   History of Present Illness  Pt is an 80 y/o F admitted on 11/19/20 with c/c of a fall where pt woke up in the bathroom lying in blood.  Pt found to have a traumatic SAH & underlying parenchymal hemorrhage in the L frontal lobe. Of note, pt was discharged from [facility] on 11/13/20 following treatment for metabolic encephalopathy & low K+. PMH: paroxysmal a-fib, CAD s/p CABG, heart failure preserved EF, NYHA class III, HTN, hypothyroid, bilateral carotid artery stenosis, HLD  Clinical Impression  Pt seen for PT evaluation with co-tx with OT. Pt with husband & son present & assisting with providing PLOF/home set up information. Pt requires min assist overall for bed mobility with use of hospital bed features & short distance gait with RW (max of 25 ft). Pt c/o lightheadedness with change in position but no nystagmus noted & pt negative for orthostatics. Pt fatigued by end of session with family reporting this is close to pt's baseline. Educated pt & family on recommendation of 24 hr supervision upon d/c & they are agreeable. Will continue to follow pt acutely to progress mobility as able.  BP checked in RUE: Sitting: 119/48 mmHg (MAP 69), HR 62 bpm Standing at 0: 115/59 mmHg (MAP 76), HR 68 bpm Standing at 3: 120/59 mmHg (MAP 77), HR 68 bpm     Follow Up Recommendations Home health PT;Supervision for mobility/OOB    Equipment Recommendations   (none (pt already has RW))    Recommendations for Other Services       Precautions / Restrictions Precautions Precautions: Fall Restrictions Weight Bearing Restrictions: No      Mobility  Bed Mobility Overal bed mobility: Needs Assistance Bed Mobility: Supine to Sit     Supine to sit: Min assist     General bed mobility comments: HOB elevated, bed rails    Transfers Overall transfer level: Needs  assistance Equipment used: Rolling walker (2 wheeled) Transfers: Sit to/from Stand Sit to Stand: Min guard         General transfer comment: cuing for safe hand placement  Ambulation/Gait Ambulation/Gait assistance: Min assist Gait Distance (Feet): 10 Feet (+ 25 ft + 25 ft) Assistive device: Rolling walker (2 wheeled) Gait Pattern/deviations: Decreased step length - left;Decreased step length - right;Decreased stride length;Decreased dorsiflexion - right;Decreased dorsiflexion - left Gait velocity: decreased   General Gait Details: narrow step width RLE & slight R lateral lean with cuing for increased step width RLE  Stairs            Wheelchair Mobility    Modified Rankin (Stroke Patients Only)       Balance Overall balance assessment: Needs assistance Sitting-balance support: No upper extremity supported;Feet supported Sitting balance-Leahy Scale: Fair     Standing balance support: Single extremity supported;During functional activity Standing balance-Leahy Scale: Poor Standing balance comment: MIN A for anterior lean                             Pertinent Vitals/Pain Pain Assessment: Faces Faces Pain Scale: Hurts little more Pain Location: headache Pain Descriptors / Indicators: Headache Pain Intervention(s): Limited activity within patient's tolerance;Repositioned    Home Living Family/patient expects to be discharged to:: Private residence Living Arrangements: Spouse/significant other Available Help at Discharge: Family;Available PRN/intermittently (almost available 24/7) Type of Home: House Home Access: Level entry  Home Layout:  (3 STE to access bedroom without rails) Home Equipment: Walker - 2 wheels;Walker - 4 wheels;Cane - single point;Toilet riser      Prior Function Level of Independence: Independent with assistive device(s)         Comments: MOD I using 4WW, reports 2 falls in last 6 months including 1 leading to  admission. Per family pt requires PRN assist with bed mobility and donning compression stockings. Pt frequently forgets to call for assistance     Hand Dominance   Dominant Hand: Right    Extremity/Trunk Assessment   Upper Extremity Assessment Upper Extremity Assessment: Generalized weakness (hx of CVA causing RUE/RLE weakness)    Lower Extremity Assessment Lower Extremity Assessment: Generalized weakness (hx of CVA causing RUE/RLE weakness, not formally tested)       Communication   Communication: No difficulties  Cognition Arousal/Alertness: Awake/alert;Lethargic Behavior During Therapy: WFL for tasks assessed/performed;Flat affect Overall Cognitive Status: Within Functional Limits for tasks assessed                                        General Comments General comments (skin integrity, edema, etc.): Pt with continent void & BM on toilet, performing peri hygiene without physical assistance    Exercises Other Exercises Other Exercises: Pt and family educated re: OT role, DME recs, d/c recs, falls prevention, home/routines modifications, AE recs Other Exercises: LBD, toileting, sup>sit, sit<>stand x2, sitting/standing balance/tolerance, ~50 ft mobility   Assessment/Plan    PT Assessment Patient needs continued PT services  PT Problem List Decreased strength;Decreased mobility;Decreased safety awareness;Decreased activity tolerance;Decreased balance       PT Treatment Interventions DME instruction;Therapeutic activities;Modalities;Gait training;Therapeutic exercise;Patient/family education;Stair training;Balance training;Functional mobility training;Neuromuscular re-education;Manual techniques    PT Goals (Current goals can be found in the Care Plan section)  Acute Rehab PT Goals Patient Stated Goal: to go home PT Goal Formulation: With patient/family Time For Goal Achievement: 12/04/20 Potential to Achieve Goals: Good    Frequency Min 2X/week    Barriers to discharge Decreased caregiver support;Inaccessible home environment 3 steps to access bedroom    Co-evaluation PT/OT/SLP Co-Evaluation/Treatment: Yes Reason for Co-Treatment: Necessary to address cognition/behavior during functional activity;For patient/therapist safety PT goals addressed during session: Mobility/safety with mobility;Balance;Proper use of DME OT goals addressed during session: ADL's and self-care       AM-PAC PT "6 Clicks" Mobility  Outcome Measure Help needed turning from your back to your side while in a flat bed without using bedrails?: A Little Help needed moving from lying on your back to sitting on the side of a flat bed without using bedrails?: A Lot Help needed moving to and from a bed to a chair (including a wheelchair)?: A Little Help needed standing up from a chair using your arms (e.g., wheelchair or bedside chair)?: A Little Help needed to walk in hospital room?: A Little Help needed climbing 3-5 steps with a railing? : A Lot 6 Click Score: 16    End of Session   Activity Tolerance: Patient tolerated treatment well Patient left: in chair;with chair alarm set;with call bell/phone within reach;with family/visitor present Nurse Communication: Mobility status PT Visit Diagnosis: Unsteadiness on feet (R26.81);Muscle weakness (generalized) (M62.81)    Time: 2706-2376 PT Time Calculation (min) (ACUTE ONLY): 39 min   Charges:   PT Evaluation $PT Eval Moderate Complexity: 1 Mod PT Treatments $Therapeutic Activity: 8-22 mins  Lavone Nian, PT, DPT 11/20/20, 5:09 PM   Waunita Schooner 11/20/2020, 5:05 PM

## 2020-11-20 NOTE — Progress Notes (Signed)
Radiology called to update the RN about the CT result of the head, Dr. Sidney Ace was notified, and PA Derrill Kay also aware. Dr. Tobie Poet was added to the conversation, she came to see the patient at the bedside. Pt is closely monitored, she is alert, oriented, and respond appropriately to neuro questions, neuro checks intact. Dr. Sidney Ace ordered IV bolus to be given to pt for low blood pressure. Pt still complained of HA 4/10, tylenol prn administered.Dr. Izora Ribas said is ok for the pt to stay in PCU, and be monitored. Another CT scan is schedule for 0530. Dr. Tobie Poet say the family will be updated later,after the upcoming CT. Staff will continue to monitor pt closely.

## 2020-11-20 NOTE — Progress Notes (Signed)
    Attending Progress Note  History: PALIN TRISTAN is a pleasant 80 y.o full event resulting in traumatic subarachnoid hemorrhage in underlying parenchymal hemorrhage in the left frontal lobe for which neurosurgery was consulted.  He is currently admitted to internal medicine for further medical work-up and optimization.  Physical Exam: Vitals:   11/20/20 0806 11/20/20 1244  BP: (!) 82/46 (!) 92/56  Pulse: 67 64  Resp: 16 16  Temp: 98.3 F (36.8 C) 98.2 F (36.8 C)  SpO2: 96% 96%    AA Ox3 CNI Strength:5/5 throughout LUE and LLE. 4-/5 RUE and RLE (2/2 to hx of stroke). + Pronator drift  Data:  Recent Labs  Lab 11/19/20 1401 11/20/20 0407  NA 135 136  K 4.4 4.0  CL 93* 96*  CO2 33* 32  BUN 45* 48*  CREATININE 2.23* 1.98*  GLUCOSE 199* 109*  CALCIUM 9.4 8.4*   Recent Labs  Lab 11/19/20 1526  AST 54*  ALT 33  ALKPHOS 99     Recent Labs  Lab 11/19/20 1401 11/20/20 0407  WBC 3.7* 4.0  HGB 10.3* 9.7*  HCT 29.3* 28.5*  PLT 62* 68*   Recent Labs  Lab 11/19/20 1526  INR 1.2         Other tests/results:  CT head 11/19/20 1602  IMPRESSION: 1. Subarachnoid hemorrhage and small parenchymal contusion within the left frontal lobe, with trace subarachnoid hemorrhage along the right frontal convexity. No mass effect or midline shift. 2. Left frontal scalp laceration.  No acute fracture.   Electronically Signed   By: Randa Ngo M.D.   On: 11/19/2020 16:33  CT head 11/19/20 2229  IMPRESSION: Persistent left frontal subarachnoid hemorrhage is noted and stable. Previously seen right frontal subarachnoid hemorrhage has resolved. There is a new area of hemorrhage along the septum pellucidum to the left of the midline with evidence of pooling of intraventricular blood in the occipital horn of the left lateral ventricle. This area of hemorrhage measures up to 13 mm in dimension. Follow-up is recommended. Electronically Signed   By: Inez Catalina M.D.    On: 11/19/2020 22:40  CT head 11/20/20 0533  IMPRESSION: 1. Stable since 2224 hours yesterday. Small volume subarachnoid and intraventricular hemorrhage without progression. No intracranial mass effect. 2. Left forehead and periorbital scalp hematoma with No skull fracture identified. 3. No new intracranial abnormality. Electronically Signed   By: Genevie Ann M.D.   On: 11/20/2020 05:54  Assessment/Plan:  RAELLE CHAMBERS is a 79 year old presenting to the hospital after a syncopal event resulting in a closed head injury.  Interval head CT from this morning shows stable subarachnoid hemorrhage.  Patient is more alert this morning without new neurologic deficit.  - Medical management and optimization per primary team - No need for acute neurosurgical intervention at this time - Will plan to follow up with the patient outpatient.  - Please feel free to page neurosurgery with any questions or concerns.  Cooper Render  Department of Neurosurgery

## 2020-11-20 NOTE — Progress Notes (Signed)
[facility] [place] NAME: Theresa [name]    MR#:  [id number]  DATE OF BIRTH:  24-Mar-1941  SUBJECTIVE:   patient came in after syncopal episode at home. She was trying to get up off the commode and nesting she was down on the floor hit her head and had bleeding from left side of her forehead. She is awake and alert. Son and husband in the room. Ate good breakfast this morning. Denies any chest pain.  blood pressure was soft when she came in. Currently receiving IV fluids. REVIEW OF SYSTEMS:   Review of Systems  Constitutional:  Negative for chills, fever and weight loss.  HENT:  Negative for ear discharge, ear pain and nosebleeds.   Eyes:  Negative for blurred vision, pain and discharge.  Respiratory:  Negative for sputum production, shortness of breath, wheezing and stridor.   Cardiovascular:  Negative for chest pain, palpitations, orthopnea and PND.  Gastrointestinal:  Negative for abdominal pain, diarrhea, nausea and vomiting.  Genitourinary:  Negative for frequency and urgency.  Musculoskeletal:  Positive for joint pain. Negative for back pain.  Neurological:  Positive for weakness. Negative for sensory change, speech change and focal weakness.  Psychiatric/Behavioral:  Negative for depression and hallucinations. The patient is not nervous/anxious.   Tolerating Diet:yes Tolerating PT: pending  DRUG ALLERGIES:   Allergies  Allergen Reactions  . Tape Rash  . Morphine And Related Nausea And Vomiting    Other reaction(s): Nausea And Vomiting  . Other Rash    NEOPRENE, TAPE, BAND-AID TOUGH STRIPS    VITALS:  Blood pressure (!) 92/56, pulse 64, temperature 98.2 F (36.8 C), temperature source Oral, resp. rate 16, height 5' (1.524 m), weight 73.5 kg, SpO2 96 %.  PHYSICAL EXAMINATION:   Physical Exam  GENERAL:  80 y.o.-year-old patient lying in the bed with no acute distress.  HEENT: left scalp bruise + frontal area with laceration  /sutures, normocephalic. Oropharynx and nasopharynx clear.   LUNGS: Normal breath sounds bilaterally, no wheezing, rales, rhonchi. No use of accessory muscles of respiration.  CARDIOVASCULAR: S1, S2 normal. No murmurs, rubs, or gallops.  ABDOMEN: Soft, nontender, nondistended. Bowel sounds present. No organomegaly or mass.  EXTREMITIES: No cyanosis, clubbing or edema b/l.    NEUROLOGIC: Cranial nerves II through XII are intact. No focal Motor or sensory deficits b/l.   PSYCHIATRIC:  patient is alert and oriented x 3.  SKIN: No obvious rash, lesion, or ulcer.   LABORATORY PANEL:  CBC Recent Labs  Lab 11/20/20 0407  WBC 4.0  HGB 9.7*  HCT 28.5*  PLT 68*    Chemistries  Recent Labs  Lab 11/19/20 1526 11/20/20 0407  NA  --  136  K  --  4.0  CL  --  96*  CO2  --  32  GLUCOSE  --  109*  BUN  --  48*  CREATININE  --  1.98*  CALCIUM  --  8.4*  AST 54*  --   ALT 33  --   ALKPHOS 99  --   BILITOT 3.5*  --    Cardiac Enzymes No results for input(s): TROPONINI in the last 168 hours. RADIOLOGY:  CT HEAD WO CONTRAST (5MM)  Result Date: 11/20/2020 CLINICAL DATA:  80 year old female status post fall. Small volume subarachnoid and intraventricular hemorrhage. EXAM: CT HEAD WITHOUT CONTRAST TECHNIQUE: Contiguous axial images were obtained from the base of the skull through the vertex without intravenous contrast. COMPARISON:  11/19/2020 head CTs.  Brain MRI 04/22/2016. FINDINGS: Brain: Small volume left inferior frontal gyrus region subarachnoid hemorrhage is stable since 1549 hours yesterday, with no definite underlying cerebral contusion identified. There is also probably trace subarachnoid hemorrhage in the contrecoup region right parietal lobe series 3, image 21, stable since 2224 hours yesterday. A small volume of intraventricular hemorrhage both layering in the left occipital horn and adherent to the septum pellucidum near the foramen of Monro (series 3, image 14) is stable since 2224  hours yesterday. No other extra-axial blood identified. No midline shift, mass effect, or evidence of intracranial mass lesion. Stable chronic ventriculomegaly. Basilar cisterns remain normal. Stable gray-white matter differentiation throughout the brain. No cortically based acute infarct identified. Vascular: Calcified atherosclerosis at the skull base. No suspicious intracranial vascular hyperdensity. Skull: Stable.  No fracture identified. Sinuses/Orbits: Chronic left sphenoid and right maxillary sinusitis with mucoperiosteal thickening appears stable. Tympanic cavities and mastoids remain clear. Other: Anterior left forehead and periorbital scalp hematoma redemonstrated. Underlying left frontal bone appears intact. Visible orbits soft tissues remain within normal limits. IMPRESSION: 1. Stable since 2224 hours yesterday. Small volume subarachnoid and intraventricular hemorrhage without progression. No intracranial mass effect. 2. Left forehead and periorbital scalp hematoma with No skull fracture identified. 3. No new intracranial abnormality. Electronically Signed   By: Genevie Ann M.D.   On: 11/20/2020 05:54   CT HEAD WO CONTRAST (5MM)  Result Date: 11/19/2020 CLINICAL DATA:  Follow-up subarachnoid hemorrhage EXAM: CT HEAD WITHOUT CONTRAST TECHNIQUE: Contiguous axial images were obtained from the base of the skull through the vertex without intravenous contrast. COMPARISON:  CT from earlier in the same day. FINDINGS: Brain: Persistent left frontal subarachnoid hemorrhage is identified and stable. The right-sided frontal subarachnoid hemorrhage is not well appreciated on this exam. There is increasing attenuation along the septum pellucidum to the left of the midline consistent with focal hemorrhage as well as a small amount of intraventricular blood new from the prior study. No other subarachnoid hemorrhage is noted. Persistent ventricular dilatation is seen. Vascular: No hyperdense vessel or unexpected  calcification. Skull: Normal. Negative for fracture or focal lesion. Sinuses/Orbits: No acute finding. Other: None. IMPRESSION: Persistent left frontal subarachnoid hemorrhage is noted and stable. Previously seen right frontal subarachnoid hemorrhage has resolved. There is a new area of hemorrhage along the septum pellucidum to the left of the midline with evidence of pooling of intraventricular blood in the occipital horn of the left lateral ventricle. This area of hemorrhage measures up to 13 mm in dimension. Follow-up is recommended. Critical Value/emergent results were called by telephone at the time of interpretation on 11/19/2020 at 10:40 pm to [facility] the patient's nurse, who verbally acknowledged these results and will relay to Dr. Tobie Poet or other attending physician. Electronically Signed   By: Inez Catalina M.D.   On: 11/19/2020 22:40   CT HEAD WO CONTRAST (5MM)  Result Date: 11/19/2020 CLINICAL DATA:  Found down, left-sided scalp laceration, increasing confusion EXAM: CT HEAD WITHOUT CONTRAST TECHNIQUE: Contiguous axial images were obtained from the base of the skull through the vertex without intravenous contrast. COMPARISON:  07/31/2019 FINDINGS: Brain: There is increased attenuation within the left frontal lobe, consistent with small area of subarachnoid hemorrhage and possible contusion. Additionally, there is some faint attenuation within right frontal lobe sulci image 17 and image 23, consistent with subarachnoid hemorrhage. No acute infarct. Continued dilation of the lateral ventricles out of proportion to the degree of cerebral atrophy. No mass effect. Vascular: No hyperdense  vessel or unexpected calcification. Skull: Left frontal scalp laceration. No underlying fracture. The remainder of the calvarium is unremarkable. Sinuses/Orbits: Inspissated secretions are seen within the right maxillary and left sphenoid sinus, stable. Remaining sinuses are clear. Other: None. IMPRESSION: 1. Subarachnoid  hemorrhage and small parenchymal contusion within the left frontal lobe, with trace subarachnoid hemorrhage along the right frontal convexity. No mass effect or midline shift. 2. Left frontal scalp laceration.  No acute fracture. Critical Value/emergent results were called by telephone at the time of interpretation on 11/19/2020 at 4:29 pm to provider DR Tamala Julian, who verbally acknowledged these results. Electronically Signed   By: Randa Ngo M.D.   On: 11/19/2020 16:33   CT Cervical Spine Wo Contrast  Result Date: 11/19/2020 CLINICAL DATA:  [place], found down, left frontal scalp laceration EXAM: CT CERVICAL SPINE WITHOUT CONTRAST TECHNIQUE: Multidetector CT imaging of the cervical spine was performed without intravenous contrast. Multiplanar CT image reconstructions were also generated. COMPARISON:  None. FINDINGS: Alignment: Minimal anterolisthesis of C4 relative to C5 likely due to extensive degenerative change. Otherwise alignment is anatomic. Skull base and vertebrae: No acute fracture. No primary bone lesion or focal pathologic process. Soft tissues and spinal canal: No prevertebral fluid or swelling. No visible canal hematoma. Disc levels: There is extensive multilevel cervical spondylosis most pronounced at C4-5, C5-6, and C6-7. Prominent facet hypertrophy on the left at C4-5. Bony fusion across the craniocervical junction. Upper chest: Airway is patent. There are small bilateral pleural effusions, right greater than left. The lung apices are otherwise clear. Other: Reconstructed images demonstrate no additional findings. IMPRESSION: 1. No acute cervical spine fracture. 2. Multilevel cervical spondylosis and facet hypertrophy. 3. Small bilateral pleural effusions, right greater than left. Electronically Signed   By: Randa Ngo M.D.   On: 11/19/2020 16:32   DG Pelvis Portable  Result Date: 11/19/2020 CLINICAL DATA:  Pelvic pain following fall EXAM: PORTABLE PELVIS 1-2 VIEWS COMPARISON:  None.  FINDINGS: Normal alignment. No acute fracture or dislocation. Sacroiliac and hip joint spaces are preserved. Soft tissues are unremarkable. IMPRESSION: No acute fracture or dislocation. Electronically Signed   By: Fidela Salisbury M.D.   On: 11/19/2020 19:09   DG Chest Portable 1 View  Result Date: 11/19/2020 CLINICAL DATA:  CHF Worsening renal function Assess fluid volume EXAM: PORTABLE CHEST 1 VIEW COMPARISON:  02/18/2020 FINDINGS: Postsurgical changes of CABG again seen. There is borderline cardiomegaly. Mild pulmonary vascular congestion is seen. Mild bibasilar opacities likely due to atelectasis. IMPRESSION: Mild pulmonary vascular congestion. Electronically Signed   By: Miachel Roux M.D.   On: 11/19/2020 15:50   ASSESSMENT AND PLAN:   EVALISE ABRUZZESE is a 80 y.o. female with medical history significant for paroxysmal atrial fibrillation, history of CAD status post CABG, heart failure preserved ejection fraction, NYHA class III, hypertension, hypothyroid, bilateral carotid artery stenosis, hyperlipidemia, presents emergency department for chief concerns of a fall and head trauma.  syncopal episode resulting in subarachnoid hemorrhage and left frontal scalp laceration -- etiology could be arrhythmia versus over diuresis/dehydration and hypotension -- patient was hypotensive when she presented the emergency room. She is currently receiving IV fluid -- her creatinine was 2.3 baseline is 1.7 -- blood pressure much improved after IV fluids. Creatinine trending down. -- hold torsemide for couple days-- cardiology agrees -- consider Holter monitor to be placed by cardiology next week -- hold BP meds  subarachnoid bleed with left frontal scalp laceration -- repeat CT head remains stable -- seen by neurosurgery-- no  further recommendations  history of cirrhosis of the liver--NASH portal hypertensive gastropathy history of esophageal variances -- patient follows with hepatology at [facility] -- she had  been on torsemide 40 mg in the morning and 20 in the evening along with metolazone----recently admitted at [facility] and she was discharged on Spironolactone and torsemide above dose -- continue rifaximin, lactulose  Hyperlipidemia -- statins  Hypothyroidism -- Synthroid  type II diabetes with renal manifestations -- A1c was 5.4 -- continue farxiga  Procedures: left frontal scalp laceration-- sutured Family communication : son and husband in the room Consults : neurosurgery, cardiology CODE STATUS: full DVT Prophylaxis :SCD Level of care: Progressive Cardiac Status is: Inpatient  Remains inpatient appropriate because:Inpatient level of care appropriate due to severity of illness  Dispo: The patient is from: Home              Anticipated d/c is to: Home              Patient currently is not medically stable to d/c.   Difficult to place patient No        TOTAL TIME TAKING CARE OF THIS PATIENT: 25 minutes.  >50% time spent on counselling and coordination of care  Note: This dictation was prepared with Dragon dictation along with smaller phrase technology. Any transcriptional errors that result from this process are unintentional.  Fritzi Mandes M.D    [facility]   CC: Primary care physician; Tracie Harrier, MD Patient ID: Brantley Stage, female   DOB: 1941-01-20, 80 y.o.   MRN: [id number]

## 2020-11-20 NOTE — Consult Note (Signed)
[facility] Cardiology  CARDIOLOGY CONSULT NOTE  Patient ID: Theresa [name] MRN: [id number] DOB/AGE: 1941/03/03 80 y.o.  Admit date: 11/19/2020 Referring Physician Fritzi Mandes Primary Physician Azzie Glatter, MD  Primary Cardiologist Serafina Royals Reason for Consultation Syncope. 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  Cardiology is consulted for evaluation of syncope.  She was recently seen in cardiology clinic on 11-17-2020 for a follow-up visit after a recent hospitalization for heart failure.  During this visit she was in rate controlled atrial flutter with a rate of approximately 60-70.  She was continued on rate control medications (12.5 mg metoprolol XL daily) and was not started on anticoagulation given her history of AV malformations.  She was doing well after this until yesterday when she had a syncopal episode; she woke up on the floor in her bathroom around 11:30 after falling. She has no recollection of the event, but there was period of time where she was no accompanied before being found by her husband after the fall. They think that she was sitting on the toilet and when she went to stand she got dizzy and fell.   Notably her LE edema is completely resolved in the last few days. She has no LE edema, orthopnea, PND.   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   Sleep apnea    SOB (shortness of breath)    Stroke ([place])    Temporal arteritis (HCC)    Thrombocytopenia (HCC)    Thrombocytopenia ([place])     Past Surgical History:  Procedure Laterality Date   COLONOSCOPY  10/24/2013, 2002   hyperplastic polpys   CORONARY ANGIOPLASTY     CORONARY ARTERY BYPASS GRAFT  09/1998   ESOPHAGOGASTRODUODENOSCOPY (EGD) WITH PROPOFOL N/A 08/26/2016   Procedure: ESOPHAGOGASTRODUODENOSCOPY (EGD) WITH PROPOFOL;  Surgeon: Manya Silvas, MD;  Location: [facility] ENDOSCOPY;  Service: Endoscopy;  Laterality: N/A;   ESOPHAGOGASTRODUODENOSCOPY (EGD) WITH PROPOFOL N/A 02/05/2018   Procedure: ESOPHAGOGASTRODUODENOSCOPY (EGD) WITH PROPOFOL;  Surgeon: Manya Silvas, MD;  Location: [facility] ENDOSCOPY;  Service: Endoscopy;  Laterality: N/A;   ESOPHAGOGASTRODUODENOSCOPY (EGD) WITH PROPOFOL N/A 07/22/2019   Procedure: ESOPHAGOGASTRODUODENOSCOPY (EGD) WITH PROPOFOL;  Surgeon: Toledo, Benay Pike, MD;  Location: [facility] ENDOSCOPY;  Service: Gastroenterology;  Laterality: N/A;   ESOPHAGOGASTRODUODENOSCOPY ENDOSCOPY  05/24/2011   hemorroid surgery     JOINT REPLACEMENT  2003  2005   bil.knees    Medications Prior to Admission  Medication Sig Dispense Refill Last Dose   alendronate (  FOSAMAX) 70 MG tablet Take 70 mg by mouth every Monday. Take with a full glass of water on an empty stomach.   11/16/2020 at am   Calcium Carbonate-Vitamin D 600-200 MG-UNIT TABS Take 1 tablet by mouth daily.   11/19/2020 at 0730   CINNAMON PO Take by mouth.   11/19/2020 at 0730   Cranberry-Milk Thistle 250-75 MG CAPS Take 1 capsule by mouth 3 (three) times daily.    11/19/2020 at 0730   dapagliflozin  propanediol (FARXIGA) 10 MG TABS tablet Take 10 mg by mouth daily.   11/19/2020 at 0730   ferrous sulfate 325 (65 FE) MG tablet Take 325 mg by mouth daily.    11/19/2020 at 0730   gabapentin (NEURONTIN) 100 MG capsule Take 200 mg by mouth at bedtime.    11/18/2020 at pm   levothyroxine (SYNTHROID, LEVOTHROID) 50 MCG tablet Take 50 mcg by mouth daily before breakfast.    11/19/2020 at 0630   Melatonin 10 MG CAPS Take 10 mg by mouth at bedtime.    11/18/2020 at pm   Menthol-Camphor (ICY HOT ADVANCED PAIN RELIEF) 16-11 % CREA Apply 1 application topically as directed.   prn at prn   metoprolol succinate (TOPROL-XL) 25 MG 24 hr tablet Take 12.5 mg by mouth daily.   11/19/2020 at 0730   omeprazole (PRILOSEC) 20 MG capsule Take 20 mg by mouth daily.    11/19/2020 at 0730   potassium chloride SA (K-DUR,KLOR-CON) 20 MEQ tablet Take 1 tablet (20 mEq total) by mouth 2 (two) times daily. (Patient taking differently: Take 20 mEq by mouth daily.) 14 tablet 0 11/19/2020 at 0730   rifaximin (XIFAXAN) 550 MG TABS tablet Take 550 mg by mouth 2 (two) times daily.    11/19/2020 at 0730   simvastatin (ZOCOR) 20 MG tablet Take 20 mg by mouth at bedtime.    11/18/2020 at 1730   spironolactone (ALDACTONE) 50 MG tablet Take 50 mg by mouth daily.   11/19/2020 at 0730   torsemide (DEMADEX) 20 MG tablet Take 20 mg by mouth 2 (two) times daily.   11/19/2020 at 0730   blood glucose meter kit and supplies KIT Dispense based on patient and insurance preference. Use up to four times daily as directed. (FOR ICD-9 250.00, 250.01). 1 each 0    glipiZIDE (GLUCOTROL XL) 2.5 MG 24 hr tablet Take 2.5 mg by mouth daily with breakfast. (Patient not taking: No sig reported)   Not Taking   Social History   Socioeconomic History   Marital status: Married    Spouse name: [employer]   Number of children: Not on file   Years of education: Not on file   Highest education level: Not on file  Occupational History   Not on file  Tobacco Use    Smoking status: Never   Smokeless tobacco: Never  Vaping Use   Vaping Use: Never used  Substance and Sexual Activity   Alcohol use: No    Alcohol/week: 0.0 standard drinks   Drug use: No   Sexual activity: Not on file  Other Topics Concern   Not on file  Social History Narrative   Not on file   Social Determinants of Health   Financial Resource Strain: Not on file  Food Insecurity: Not on file  Transportation Needs: Not on file  Physical Activity: Not on file  Stress: Not on file  Social Connections: Not on file  Intimate Partner Violence: Not on file    Family History  Problem Relation Age of Onset   Heart disease Other    Hypertension Other    Anemia Other    Colon cancer Other    CVA Mother    Dementia Mother    CAD Father    Breast cancer Neg Hx       Review of systems complete and found to be negative unless listed above    PHYSICAL EXAM  General: Elderly female. NAD.  HEENT:  Hematoma on left forehead. Sutures in place. Neck: JVD elevated with large V wave Lungs: Clear bilaterally to auscultation and percussion. Heart: Bradycardic and irregular . 3/6 systolic murmur. Abdomen: Bowel sounds are positive, abdomen soft and non-tender  Msk:  Back normal, normal gait. Normal strength and tone for age. Extremities: No clubbing, cyanosis or edema.  Pain with palpation of BL LE. Neuro: Alert and oriented X 3. Psych:  Good affect, responds appropriately  Labs:   Lab Results  Component Value Date   WBC 4.0 11/20/2020   HGB 9.7 (L) 11/20/2020   HCT 28.5 (L) 11/20/2020   MCV 91.6 11/20/2020   PLT 68 (L) 11/20/2020    Recent Labs  Lab 11/19/20 1526 11/20/20 0407  NA  --  136  K  --  4.0  CL  --  96*  CO2  --  32  BUN  --  48*  CREATININE  --  1.98*  CALCIUM  --  8.4*  PROT 7.2  --   BILITOT 3.5*  --   ALKPHOS 99  --   ALT 33  --   AST 54*  --   GLUCOSE  --  109*   Lab Results  Component Value Date   CKTOTAL 51 04/22/2016   CKMB 1.1  05/25/2011   TROPONINI <0.03 04/22/2016    Lab Results  Component Value Date   CHOL 195 04/23/2016   CHOL 184 11/29/2012   Lab Results  Component Value Date   HDL 43 04/23/2016   HDL 52 11/29/2012   Lab Results  Component Value Date   LDLCALC 138 (H) 04/23/2016   LDLCALC 121 (H) 11/29/2012   Lab Results  Component Value Date   TRIG 70 04/23/2016   TRIG 57 11/29/2012   Lab Results  Component Value Date   CHOLHDL 4.5 04/23/2016   No results found for: LDLDIRECT    Radiology: CT HEAD WO CONTRAST (5MM)  Result Date: 11/20/2020 CLINICAL DATA:  80 year old female status post fall. Small volume subarachnoid and intraventricular hemorrhage. EXAM: CT HEAD WITHOUT CONTRAST TECHNIQUE: Contiguous axial images were obtained from the base of the skull through the vertex without intravenous contrast. COMPARISON:  11/19/2020 head CTs.  Brain MRI 04/22/2016. FINDINGS: Brain: Small volume left inferior frontal gyrus region subarachnoid hemorrhage is stable since 1549 hours yesterday, with no definite underlying cerebral contusion identified. There is also probably trace subarachnoid hemorrhage in the contrecoup region right parietal lobe series 3, image 21, stable since 2224 hours yesterday. A small volume of intraventricular hemorrhage both layering in the left occipital horn and adherent to the septum pellucidum near the foramen of Monro (series 3, image 14) is stable since 2224 hours yesterday. No other extra-axial blood identified. No midline shift, mass effect, or evidence of intracranial mass lesion. Stable chronic ventriculomegaly. Basilar cisterns remain normal. Stable gray-white matter differentiation throughout the brain. No cortically based acute infarct identified. Vascular: Calcified atherosclerosis at the skull base. No suspicious intracranial vascular hyperdensity. Skull: Stable.  No fracture identified. Sinuses/Orbits: Chronic left sphenoid and  right maxillary sinusitis with  mucoperiosteal thickening appears stable. Tympanic cavities and mastoids remain clear. Other: Anterior left forehead and periorbital scalp hematoma redemonstrated. Underlying left frontal bone appears intact. Visible orbits soft tissues remain within normal limits. IMPRESSION: 1. Stable since 2224 hours yesterday. Small volume subarachnoid and intraventricular hemorrhage without progression. No intracranial mass effect. 2. Left forehead and periorbital scalp hematoma with No skull fracture identified. 3. No new intracranial abnormality. Electronically Signed   By: Genevie Ann M.D.   On: 11/20/2020 05:54   CT HEAD WO CONTRAST (5MM)  Result Date: 11/19/2020 CLINICAL DATA:  Follow-up subarachnoid hemorrhage EXAM: CT HEAD WITHOUT CONTRAST TECHNIQUE: Contiguous axial images were obtained from the base of the skull through the vertex without intravenous contrast. COMPARISON:  CT from earlier in the same day. FINDINGS: Brain: Persistent left frontal subarachnoid hemorrhage is identified and stable. The right-sided frontal subarachnoid hemorrhage is not well appreciated on this exam. There is increasing attenuation along the septum pellucidum to the left of the midline consistent with focal hemorrhage as well as a small amount of intraventricular blood new from the prior study. No other subarachnoid hemorrhage is noted. Persistent ventricular dilatation is seen. Vascular: No hyperdense vessel or unexpected calcification. Skull: Normal. Negative for fracture or focal lesion. Sinuses/Orbits: No acute finding. Other: None. IMPRESSION: Persistent left frontal subarachnoid hemorrhage is noted and stable. Previously seen right frontal subarachnoid hemorrhage has resolved. There is a new area of hemorrhage along the septum pellucidum to the left of the midline with evidence of pooling of intraventricular blood in the occipital horn of the left lateral ventricle. This area of hemorrhage measures up to 13 mm in dimension. Follow-up  is recommended. Critical Value/emergent results were called by telephone at the time of interpretation on 11/19/2020 at 10:40 pm to [facility] the patient's nurse, who verbally acknowledged these results and will relay to Dr. Tobie Poet or other attending physician. Electronically Signed   By: Inez Catalina M.D.   On: 11/19/2020 22:40   CT HEAD WO CONTRAST (5MM)  Result Date: 11/19/2020 CLINICAL DATA:  Found down, left-sided scalp laceration, increasing confusion EXAM: CT HEAD WITHOUT CONTRAST TECHNIQUE: Contiguous axial images were obtained from the base of the skull through the vertex without intravenous contrast. COMPARISON:  07/31/2019 FINDINGS: Brain: There is increased attenuation within the left frontal lobe, consistent with small area of subarachnoid hemorrhage and possible contusion. Additionally, there is some faint attenuation within right frontal lobe sulci image 17 and image 23, consistent with subarachnoid hemorrhage. No acute infarct. Continued dilation of the lateral ventricles out of proportion to the degree of cerebral atrophy. No mass effect. Vascular: No hyperdense vessel or unexpected calcification. Skull: Left frontal scalp laceration. No underlying fracture. The remainder of the calvarium is unremarkable. Sinuses/Orbits: Inspissated secretions are seen within the right maxillary and left sphenoid sinus, stable. Remaining sinuses are clear. Other: None. IMPRESSION: 1. Subarachnoid hemorrhage and small parenchymal contusion within the left frontal lobe, with trace subarachnoid hemorrhage along the right frontal convexity. No mass effect or midline shift. 2. Left frontal scalp laceration.  No acute fracture. Critical Value/emergent results were called by telephone at the time of interpretation on 11/19/2020 at 4:29 pm to provider DR Tamala Julian, who verbally acknowledged these results. Electronically Signed   By: Randa Ngo M.D.   On: 11/19/2020 16:33   CT Cervical Spine Wo Contrast  Result Date:  11/19/2020 CLINICAL DATA:  [place], found down, left frontal scalp laceration EXAM: CT CERVICAL SPINE WITHOUT CONTRAST TECHNIQUE: Multidetector CT  imaging of the cervical spine was performed without intravenous contrast. Multiplanar CT image reconstructions were also generated. COMPARISON:  None. FINDINGS: Alignment: Minimal anterolisthesis of C4 relative to C5 likely due to extensive degenerative change. Otherwise alignment is anatomic. Skull base and vertebrae: No acute fracture. No primary bone lesion or focal pathologic process. Soft tissues and spinal canal: No prevertebral fluid or swelling. No visible canal hematoma. Disc levels: There is extensive multilevel cervical spondylosis most pronounced at C4-5, C5-6, and C6-7. Prominent facet hypertrophy on the left at C4-5. Bony fusion across the craniocervical junction. Upper chest: Airway is patent. There are small bilateral pleural effusions, right greater than left. The lung apices are otherwise clear. Other: Reconstructed images demonstrate no additional findings. IMPRESSION: 1. No acute cervical spine fracture. 2. Multilevel cervical spondylosis and facet hypertrophy. 3. Small bilateral pleural effusions, right greater than left. Electronically Signed   By: Randa Ngo M.D.   On: 11/19/2020 16:32   DG Pelvis Portable  Result Date: 11/19/2020 CLINICAL DATA:  Pelvic pain following fall EXAM: PORTABLE PELVIS 1-2 VIEWS COMPARISON:  None. FINDINGS: Normal alignment. No acute fracture or dislocation. Sacroiliac and hip joint spaces are preserved. Soft tissues are unremarkable. IMPRESSION: No acute fracture or dislocation. Electronically Signed   By: Fidela Salisbury M.D.   On: 11/19/2020 19:09   DG Chest Portable 1 View  Result Date: 11/19/2020 CLINICAL DATA:  CHF Worsening renal function Assess fluid volume EXAM: PORTABLE CHEST 1 VIEW COMPARISON:  02/18/2020 FINDINGS: Postsurgical changes of CABG again seen. There is borderline cardiomegaly. Mild pulmonary  vascular congestion is seen. Mild bibasilar opacities likely due to atelectasis. IMPRESSION: Mild pulmonary vascular congestion. Electronically Signed   By: Miachel Roux M.D.   On: 11/19/2020 15:50    EKG: Atrial flutter (versus coarse A. fib) with variable block at a rate of 60-70  Echo 11/11/20- Normal EF. Mild RV dysfunction with RVSP = 50 mmHg. Severe TR.   ASSESSMENT AND PLAN:  #Syncope Unwitnessed episode of syncope; unclear if she truly lost consciousness versus had orthostatic symptoms causing a fall. She has been heavily diuresed recently so she might be intravascularly deplete contributing to such symptoms. Since admission she remains in slow atrial flutter at a rate of 60-70 (maybe coarse A. fib), so it is possible she had an period of slowed conduction causing a syncopal episode. Unfortunately it is impossible to know the causative event at this time.  - Agree with temporarily holding diuretic (torsemide 40 qAM, 20 qPM) and giving gentle hydration by PO intake. Likely resume diuretic tomorrow or Sunday.  - Continue cardiac monitoring while inpatient - At discharge will need cardiac event monitor that is live monitored to evaluate for episodes of large pauses.   #History of coronary artery disease status post CABG (09/1998 at [facility]) -Not on aspirin 81 mg d/t history of AVM -Continue simvastatin 20 mg daily  #History of heart failure with preserved ejection fraction # Severe TR with pulmonary HTN Unclear if cirrhosis is caused by right heart dysfunction or visa versa. Recently has required high doses of diuretics to maintain normal volume status, though might be over diuresed currently.  -Continue spironolactone 50 mg -Continue Farxiga 10 mg daily -Holding loop diuretic for now. Will need to resume in next day or so.   Signed: Andrez Grime MD 11/20/2020, 10:07 AM

## 2020-11-21 DIAGNOSIS — W19XXXA Unspecified fall, initial encounter: Secondary | ICD-10-CM

## 2020-11-21 LAB — BASIC METABOLIC PANEL
Anion gap: 6 (ref 5–15)
BUN: 43 mg/dL — ABNORMAL HIGH (ref 8–23)
CO2: 31 mmol/L (ref 22–32)
Calcium: 8.9 mg/dL (ref 8.9–10.3)
Chloride: 100 mmol/L (ref 98–111)
Creatinine, Ser: 1.88 mg/dL — ABNORMAL HIGH (ref 0.44–1.00)
GFR, Estimated: 26 mL/min — ABNORMAL LOW (ref >60)
Glucose, Bld: 129 mg/dL — ABNORMAL HIGH (ref 70–99)
Potassium: 4 mmol/L (ref 3.5–5.1)
Sodium: 137 mmol/L (ref 135–145)

## 2020-11-21 MED ORDER — TORSEMIDE 20 MG PO TABS: 40.0000 mg | ORAL_TABLET | Freq: Every day | ORAL | 1 refills | Status: DC

## 2020-11-21 MED ORDER — POLYETHYLENE GLYCOL 3350 17 G PO PACK: 17.0000 g | PACK | Freq: Two times a day (BID) | ORAL | 0 refills | Status: DC

## 2020-11-21 MED ORDER — POTASSIUM CHLORIDE CRYS ER 20 MEQ PO TBCR: 20.0000 meq | EXTENDED_RELEASE_TABLET | Freq: Every day | ORAL | 0 refills | Status: DC

## 2020-11-21 NOTE — Discharge Instructions (Signed)
Patient to follow-up with hematologist at [facility] on scheduled appointment.

## 2020-11-21 NOTE — Discharge Summary (Signed)
Theresa [name] NAME: Theresa [name]    MR#:  [id number]  DATE OF BIRTH:  Aug 03, 1940  DATE OF ADMISSION:  11/19/2020 ADMITTING PHYSICIAN: Amy N Cox, DO  DATE OF DISCHARGE: 11/21/2020  PRIMARY CARE PHYSICIAN: Tracie Harrier, MD    ADMISSION DIAGNOSIS:  Hip pain [M25.559] SAH (subarachnoid hemorrhage) ([place]) [I60.9] Subarachnoid bleed ([place]) [I60.9] Injury of head, initial encounter [S09.90XA] Fall, initial encounter [W19.XXXA] Laceration of forehead, initial encounter [S01.81XA]  DISCHARGE DIAGNOSIS:  subarachnoid hemorrhage with left frontal hematoma/laceration status post suture syncopal episode 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 Anemia   . Arthritis   . Asthma   . Bradycardia   . CAD (coronary artery disease)    BIL.CAROTID ARTERY STENOSIS  . CHF (congestive heart failure) ([place])   . Chronic low back pain   . Cirrhosis of liver not due to alcohol ([place])   . Colon polyps   . Diabetes mellitus without complication ([place])   . Dyspnea   . Esophageal varices without bleeding ([place])   . GERD (gastroesophageal reflux disease)   . Hip pain   . Hyperlipidemia   . Hypertension   . IDA (iron deficiency anemia)   . 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 Sleep apnea   . SOB (shortness of breath)   . Stroke ([place])   . Temporal arteritis ([place])   . Thrombocytopenia ([place])   . Thrombocytopenia [facility])     HOSPITAL COURSE:  Theresa NERIO is a 80 y.o. female with medical history significant for paroxysmal atrial fibrillation, history of CAD status post CABG, heart failure preserved ejection fraction, NYHA class III, hypertension, hypothyroid, bilateral carotid artery stenosis, hyperlipidemia, presents emergency department for chief concerns of a fall and head trauma.   syncopal episode resulting in subarachnoid hemorrhage and left frontal scalp  laceration -- etiology could be arrhythmia versus over diuresis/dehydration and hypotension -- patient was hypotensive when she presented the emergency room. Pt received IVF -- her creatinine was 2.3 baseline is 1.5--1.8 -- blood pressure much improved after IV fluids. Creatinine trending down. 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 -- patient follows with hepatology at [facility] -- she had been on torsemide 40 mg in the morning and 20 in the evening along with metolazone----recently admitted at [facility] and she was discharged on Spironolactone and torsemide above dose -- continue rifaximin --cont spironolactone  H/o CHF chronic systolic with severe TR  -- torsemide 40 mg daily to be started from Monday -- continue spironolactone, farxiga and toprol XL --appears compensated  Hyperlipidemia -- statins   Hypothyroidism -- Synthroid   type II diabetes with renal manifestations -- A1c was 5.4 -- continue farxiga   Procedures: left frontal scalp laceration-- sutured Family communication : son and husband in the room, dter Hydrologist on the phone Consults : neurosurgery, cardiology CODE STATUS: full DVT Prophylaxis :SCD Level of care: Progressive Cardiac Status is: Inpatient     Dispo: The patient is from: Home  Anticipated d/c is to: Home with [facility]              Patient currently is  medically optimized to d/c.              Difficult to place patient No        CONSULTS OBTAINED:    DRUG ALLERGIES:   Allergies  Allergen Reactions  . Tape Rash  . Morphine And Related Nausea And Vomiting     Other reaction(s): Nausea And Vomiting  . Other Rash    NEOPRENE, TAPE, BAND-AID TOUGH STRIPS    DISCHARGE MEDICATIONS:   Allergies as of 11/21/2020       Reactions   Tape Rash   Morphine And Related Nausea And Vomiting   Other reaction(s): Nausea And Vomiting   Other Rash   NEOPRENE, TAPE, BAND-AID TOUGH STRIPS        Medication List     STOP taking these medications    glipiZIDE 2.5 MG 24 hr tablet Commonly known as: GLUCOTROL XL       TAKE these medications    alendronate 70 MG tablet Commonly known as: FOSAMAX Take 70 mg by mouth every Monday. Take with a full glass of water on an empty stomach.   blood glucose meter kit and supplies Kit Dispense based on patient and insurance preference. Use up to four times daily as directed. (FOR ICD-9 250.00, 250.01).   Calcium Carbonate-Vitamin D 600-200 MG-UNIT Tabs Take 1 tablet by mouth daily.   CINNAMON PO Take by mouth.   Cranberry-Milk Thistle 250-75 MG Caps Take 1 capsule by mouth 3 (three) times daily.   dapagliflozin propanediol 10 MG Tabs tablet Commonly known as: FARXIGA Take 10 mg by mouth daily.   ferrous sulfate 325 (65 FE) MG tablet Take 325 mg by mouth daily.   gabapentin 100 MG capsule Commonly known as: NEURONTIN Take 200 mg by mouth at bedtime.   Icy Hot Advanced Pain Relief 16-11 % Crea Generic drug: Menthol-Camphor Apply 1 application topically as directed.   levothyroxine 50 MCG tablet Commonly known as: SYNTHROID Take 50 mcg by mouth daily before breakfast.   Melatonin 10 MG Caps Take 10 mg by mouth at bedtime.   metoprolol succinate 25 MG 24 hr tablet Commonly known as: TOPROL-XL Take 12.5 mg by mouth daily.   omeprazole 20 MG capsule Commonly known as: PRILOSEC Take 20 mg by mouth daily.   polyethylene glycol 17 g packet Commonly known as: MIRALAX / GLYCOLAX Take 17 g by mouth 2 (two) times daily.   potassium chloride SA 20 MEQ tablet Commonly known as:  KLOR-CON Take 1 tablet (20 mEq total) by mouth daily.   simvastatin 20 MG tablet Commonly known as: ZOCOR Take 20 mg by mouth at bedtime.   spironolactone 50 MG tablet Commonly known as: ALDACTONE Take 50 mg by mouth daily.   torsemide 20 MG tablet Commonly known as: DEMADEX Take 2 tablets (40 mg total) by mouth daily. Start taking on: November 23, 2020 What changed:  how much to take when to take this These instructions start on November 23, 2020. If you are unsure what to do until then, ask your doctor or other care provider.   Xifaxan 550 MG Tabs tablet Generic drug: rifaximin Take 550 mg by mouth 2 (two) times daily.        If you experience worsening of your admission symptoms, develop shortness of breath, life threatening emergency, suicidal or homicidal thoughts you must seek  medical attention immediately by calling 911 or calling your MD immediately  if symptoms less severe.  You Must read complete instructions/literature along with all the possible adverse reactions/side effects for all the Medicines you take and that have been prescribed to you. Take any new Medicines after you have completely understood and accept all the possible adverse reactions/side effects.   Please note  You were cared for by a hospitalist during your hospital stay. If you have any questions about your discharge medications or the care you received while you were in the hospital after you are discharged, you can call the unit and asked to speak with the hospitalist on call if the hospitalist that took care of you is not available. Once you are discharged, your primary care physician will handle any further medical issues. Please note that NO REFILLS for any discharge medications will be authorized once you are discharged, as it is imperative that you return to your primary care physician (or establish a relationship with a primary care physician if you do not have one) for your aftercare needs so that  they can reassess your need for medications and monitor your lab values. Today   SUBJECTIVE   Overall doing well. Wants to go home.   VITAL SIGNS:  Blood pressure (!) 107/51, pulse 66, temperature 97.9 F (36.6 C), resp. rate 17, height 5' (1.524 m), weight 73.5 kg, SpO2 96 %.  I/O:   Intake/Output Summary (Last 24 hours) at 11/21/2020 1223 Last data filed at 11/21/2020 1000 Gross per 24 hour  Intake 1020 ml  Output 1050 ml  Net -30 ml    PHYSICAL EXAMINATION:   GENERAL:  80 y.o.-year-old patient lying in the bed with no acute distress.  HEENT: left scalp bruise + frontal area with laceration /sutures, normocephalic. Oropharynx and nasopharynx clear.   LUNGS: Normal breath sounds bilaterally, no wheezing, rales, rhonchi. No use of accessory muscles of respiration.  CARDIOVASCULAR: S1, S2 normal. No murmurs, rubs, or gallops.  ABDOMEN: Soft, nontender, nondistended. Bowel sounds present. No organomegaly or mass.  EXTREMITIES: No cyanosis, clubbing or edema b/l.    NEUROLOGIC: Cranial nerves II through XII are intact. No focal Motor or sensory deficits b/l.   PSYCHIATRIC:  patient is alert and oriented x 3.  SKIN: No obvious rash, lesion, or ulcer.  DATA REVIEW:   CBC  Recent Labs  Lab 11/20/20 0407  WBC 4.0  HGB 9.7*  HCT 28.5*  PLT 68*    Chemistries  Recent Labs  Lab 11/19/20 1526 11/20/20 0407 11/21/20 0450  NA  --    < > 137  K  --    < > 4.0  CL  --    < > 100  CO2  --    < > 31  GLUCOSE  --    < > 129*  BUN  --    < > 43*  CREATININE  --    < > 1.88*  CALCIUM  --    < > 8.9  AST 54*  --   --   ALT 33  --   --   ALKPHOS 99  --   --   BILITOT 3.5*  --   --    < > = values in this interval not displayed.    Microbiology Results   Recent Results (from the past 240 hour(s))  Resp Panel by RT-PCR (Flu A&B, Covid) Nasopharyngeal Swab     Status: None   Collection Time: 11/19/20  3:26 PM   Specimen: Nasopharyngeal Swab; Nasopharyngeal(NP) swabs in vial  transport medium  Result Value Ref Range Status   SARS Coronavirus 2 by RT PCR NEGATIVE NEGATIVE Final    Comment: (NOTE) SARS-CoV-2 target nucleic acids are NOT DETECTED.  The SARS-CoV-2 RNA is generally detectable in upper respiratory specimens during the acute phase of infection. The lowest concentration of SARS-CoV-2 viral copies this assay can detect is 138 copies/mL. A negative result does not preclude SARS-Cov-2 infection and should not be used as the sole basis for treatment or other patient management decisions. A negative result may occur with  improper specimen collection/handling, submission of specimen other than nasopharyngeal swab, presence of viral mutation(s) within the areas targeted by this assay, and inadequate number of viral copies(<138 copies/mL). A negative result must be combined with clinical observations, patient history, and epidemiological information. The expected result is Negative.  Fact Sheet for Patients:  EntrepreneurPulse.com.au  Fact Sheet for Healthcare Providers:  IncredibleEmployment.be  This test is no t yet approved or cleared by the [place] FDA and  has been authorized for detection and/or diagnosis of SARS-CoV-2 by FDA under an Emergency Use Authorization (EUA). This EUA will remain  in effect (meaning this test can be used) for the duration of the COVID-19 declaration under Section 564(b)(1) of the Act, 21 U.S.C.section 360bbb-3(b)(1), unless the authorization is terminated  or revoked sooner.       Influenza A by PCR NEGATIVE NEGATIVE Final   Influenza B by PCR NEGATIVE NEGATIVE Final    Comment: (NOTE) The Xpert Xpress SARS-CoV-2/FLU/RSV plus assay is intended as an aid in the diagnosis of influenza from Nasopharyngeal swab specimens and should not be used as a sole basis for treatment. Nasal washings and aspirates are unacceptable for Xpert Xpress SARS-CoV-2/FLU/RSV testing.  Fact  Sheet for Patients: EntrepreneurPulse.com.au  Fact Sheet for Healthcare Providers: IncredibleEmployment.be  This test is not yet approved or cleared by the [place] FDA and has been authorized for detection and/or diagnosis of SARS-CoV-2 by FDA under an Emergency Use Authorization (EUA). This EUA will remain in effect (meaning this test can be used) for the duration of the COVID-19 declaration under Section 564(b)(1) of the Act, 21 U.S.C. section 360bbb-3(b)(1), unless the authorization is terminated or revoked.  Performed at [facility], [address] [place], [place] [address]     RADIOLOGY:  CT HEAD WO CONTRAST (5MM)  Result Date: 11/20/2020 CLINICAL DATA:  80 year old female status post fall. Small volume subarachnoid and intraventricular hemorrhage. EXAM: CT HEAD WITHOUT CONTRAST TECHNIQUE: Contiguous axial images were obtained from the base of the skull through the vertex without intravenous contrast. COMPARISON:  11/19/2020 head CTs.  Brain MRI 04/22/2016. FINDINGS: Brain: Small volume left inferior frontal gyrus region subarachnoid hemorrhage is stable since 1549 hours yesterday, with no definite underlying cerebral contusion identified. There is also probably trace subarachnoid hemorrhage in the contrecoup region right parietal lobe series 3, image 21, stable since 2224 hours yesterday. A small volume of intraventricular hemorrhage both layering in the left occipital horn and adherent to the septum pellucidum near the foramen of [place] (series 3, image 14) is stable since 2224 hours yesterday. No other extra-axial blood identified. No midline shift, mass effect, or evidence of intracranial mass lesion. Stable chronic ventriculomegaly. Basilar cisterns remain normal. Stable gray-white matter differentiation throughout the brain. No cortically based acute infarct identified. Vascular: Calcified atherosclerosis at the skull base. No  suspicious intracranial vascular hyperdensity. Skull: Stable.  No fracture identified.  Sinuses/Orbits: Chronic left sphenoid and right maxillary sinusitis with mucoperiosteal thickening appears stable. Tympanic cavities and mastoids remain clear. Other: Anterior left forehead and periorbital scalp hematoma redemonstrated. Underlying left frontal bone appears intact. Visible orbits soft tissues remain within normal limits. IMPRESSION: 1. Stable since 2224 hours yesterday. Small volume subarachnoid and intraventricular hemorrhage without progression. No intracranial mass effect. 2. Left forehead and periorbital scalp hematoma with No skull fracture identified. 3. No new intracranial abnormality. Electronically Signed   By: Genevie Ann M.D.   On: 11/20/2020 05:54   CT HEAD WO CONTRAST (5MM)  Result Date: 11/19/2020 CLINICAL DATA:  Follow-up subarachnoid hemorrhage EXAM: CT HEAD WITHOUT CONTRAST TECHNIQUE: Contiguous axial images were obtained from the base of the skull through the vertex without intravenous contrast. COMPARISON:  CT from earlier in the same day. FINDINGS: Brain: Persistent left frontal subarachnoid hemorrhage is identified and stable. The right-sided frontal subarachnoid hemorrhage is not well appreciated on this exam. There is increasing attenuation along the septum pellucidum to the left of the midline consistent with focal hemorrhage as well as a small amount of intraventricular blood new from the prior study. No other subarachnoid hemorrhage is noted. Persistent ventricular dilatation is seen. Vascular: No hyperdense vessel or unexpected calcification. Skull: Normal. Negative for fracture or focal lesion. Sinuses/Orbits: No acute finding. Other: None. IMPRESSION: Persistent left frontal subarachnoid hemorrhage is noted and stable. Previously seen right frontal subarachnoid hemorrhage has resolved. There is a new area of hemorrhage along the septum pellucidum to the left of the midline with evidence  of pooling of intraventricular blood in the occipital horn of the left lateral ventricle. This area of hemorrhage measures up to 13 mm in dimension. Follow-up is recommended. Critical Value/emergent results were called by telephone at the time of interpretation on 11/19/2020 at 10:40 pm to [facility] the patient's nurse, who verbally acknowledged these results and will relay to Dr. Tobie Poet or other attending physician. Electronically Signed   By: Inez Catalina M.D.   On: 11/19/2020 22:40   CT HEAD WO CONTRAST (5MM)  Result Date: 11/19/2020 CLINICAL DATA:  Found down, left-sided scalp laceration, increasing confusion EXAM: CT HEAD WITHOUT CONTRAST TECHNIQUE: Contiguous axial images were obtained from the base of the skull through the vertex without intravenous contrast. COMPARISON:  07/31/2019 FINDINGS: Brain: There is increased attenuation within the left frontal lobe, consistent with small area of subarachnoid hemorrhage and possible contusion. Additionally, there is some faint attenuation within right frontal lobe sulci image 17 and image 23, consistent with subarachnoid hemorrhage. No acute infarct. Continued dilation of the lateral ventricles out of proportion to the degree of cerebral atrophy. No mass effect. Vascular: No hyperdense vessel or unexpected calcification. Skull: Left frontal scalp laceration. No underlying fracture. The remainder of the calvarium is unremarkable. Sinuses/Orbits: Inspissated secretions are seen within the right maxillary and left sphenoid sinus, stable. Remaining sinuses are clear. Other: None. IMPRESSION: 1. Subarachnoid hemorrhage and small parenchymal contusion within the left frontal lobe, with trace subarachnoid hemorrhage along the right frontal convexity. No mass effect or midline shift. 2. Left frontal scalp laceration.  No acute fracture. Critical Value/emergent results were called by telephone at the time of interpretation on 11/19/2020 at 4:29 pm to provider DR Tamala Julian, who  verbally acknowledged these results. Electronically Signed   By: Randa Ngo M.D.   On: 11/19/2020 16:33   CT Cervical Spine Wo Contrast  Result Date: 11/19/2020 CLINICAL DATA:  [place], found down, left frontal scalp laceration EXAM: CT CERVICAL SPINE WITHOUT  CONTRAST TECHNIQUE: Multidetector CT imaging of the cervical spine was performed without intravenous contrast. Multiplanar CT image reconstructions were also generated. COMPARISON:  None. FINDINGS: Alignment: Minimal anterolisthesis of C4 relative to C5 likely due to extensive degenerative change. Otherwise alignment is anatomic. Skull base and vertebrae: No acute fracture. No primary bone lesion or focal pathologic process. Soft tissues and spinal canal: No prevertebral fluid or swelling. No visible canal hematoma. Disc levels: There is extensive multilevel cervical spondylosis most pronounced at C4-5, C5-6, and C6-7. Prominent facet hypertrophy on the left at C4-5. Bony fusion across the craniocervical junction. Upper chest: Airway is patent. There are small bilateral pleural effusions, right greater than left. The lung apices are otherwise clear. Other: Reconstructed images demonstrate no additional findings. IMPRESSION: 1. No acute cervical spine fracture. 2. Multilevel cervical spondylosis and facet hypertrophy. 3. Small bilateral pleural effusions, right greater than left. Electronically Signed   By: Randa Ngo M.D.   On: 11/19/2020 16:32   DG Pelvis Portable  Result Date: 11/19/2020 CLINICAL DATA:  Pelvic pain following fall EXAM: PORTABLE PELVIS 1-2 VIEWS COMPARISON:  None. FINDINGS: Normal alignment. No acute fracture or dislocation. Sacroiliac and hip joint spaces are preserved. Soft tissues are unremarkable. IMPRESSION: No acute fracture or dislocation. Electronically Signed   By: Fidela Salisbury M.D.   On: 11/19/2020 19:09   DG Chest Portable 1 View  Result Date: 11/19/2020 CLINICAL DATA:  CHF Worsening renal function Assess fluid  volume EXAM: PORTABLE CHEST 1 VIEW COMPARISON:  02/18/2020 FINDINGS: Postsurgical changes of CABG again seen. There is borderline cardiomegaly. Mild pulmonary vascular congestion is seen. Mild bibasilar opacities likely due to atelectasis. IMPRESSION: Mild pulmonary vascular congestion. Electronically Signed   By: Miachel Roux M.D.   On: 11/19/2020 15:50     CODE STATUS:     Code Status Orders  (From admission, onward)           Start     Ordered   11/19/20 1732  Full code  Continuous        11/19/20 1734           Code Status History     Date Active Date Inactive Code Status Order ID Comments User Context   11/02/2019 0350 11/04/2019 1955 Full Code [id number]  Athena Masse, MD ED   09/25/2019 2026 09/27/2019 1930 Full Code [id number]  Mansy, Arvella Merles, MD ED   07/31/2019 2005 08/02/2019 1718 Full Code [id number]  Mansy, Arvella Merles, MD ED   04/22/2016 1543 04/24/2016 1712 Full Code [id number]  Loletha Grayer, MD ED      Advance Directive Documentation    Flowsheet Row Most Recent Value  Type of Advance Directive Healthcare Power of Attorney  Pre-existing out of facility DNR order (yellow form or pink MOST form) --  "MOST" Form in Place? --        TOTAL TIME TAKING CARE OF THIS PATIENT: 40 minutes.    Fritzi Mandes M.D  [facility]    CC: Primary care physician; Tracie Harrier, MD

## 2020-11-21 NOTE — Consult Note (Signed)
Theresa [name]  [name] CONSULT NOTE  Patient ID: Theresa [name] MRN: [id number] DOB/AGE: [birth year] 82 y.o.  Admit date: 11/19/2020 Referring Physician Fritzi Mandes Primary Physician Azzie Glatter, MD  Primary Cardiologist Serafina Royals Reason for Consultation Syncope. 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  [name] is consulted for evaluation of syncope.  Interval history - Feels great today.  - No shortness of breath. LE edema resolved.   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   Sleep apnea    SOB (shortness of breath)    Stroke ([place])    Temporal arteritis (HCC)    Thrombocytopenia (HCC)    Thrombocytopenia ([place])     Past Surgical History:  Procedure Laterality Date   COLONOSCOPY  10/24/2013, 2002   hyperplastic polpys   CORONARY ANGIOPLASTY     CORONARY ARTERY BYPASS GRAFT  09/1998   ESOPHAGOGASTRODUODENOSCOPY (EGD) WITH PROPOFOL N/A 08/26/2016   Procedure: ESOPHAGOGASTRODUODENOSCOPY (EGD) WITH PROPOFOL;  Surgeon: Manya Silvas, MD;   Location: [facility] ENDOSCOPY;  Service: Endoscopy;  Laterality: N/A;   ESOPHAGOGASTRODUODENOSCOPY (EGD) WITH PROPOFOL N/A 02/05/2018   Procedure: ESOPHAGOGASTRODUODENOSCOPY (EGD) WITH PROPOFOL;  Surgeon: Manya Silvas, MD;  Location: [facility] ENDOSCOPY;  Service: Endoscopy;  Laterality: N/A;   ESOPHAGOGASTRODUODENOSCOPY (EGD) WITH PROPOFOL N/A 07/22/2019   Procedure: ESOPHAGOGASTRODUODENOSCOPY (EGD) WITH PROPOFOL;  Surgeon: Toledo, Benay Pike, MD;  Location: [facility] ENDOSCOPY;  Service: Gastroenterology;  Laterality: N/A;   ESOPHAGOGASTRODUODENOSCOPY ENDOSCOPY  05/24/2011   hemorroid surgery     JOINT REPLACEMENT  2003  2005   bil.knees    Medications Prior to Admission  Medication Sig Dispense Refill Last Dose   alendronate (FOSAMAX) 70 MG tablet Take 70 mg by mouth every Monday. Take with a full glass of water on an empty stomach.   11/16/2020 at am   Calcium Carbonate-Vitamin D 600-200 MG-UNIT TABS Take 1 tablet by mouth daily.   11/19/2020 at 0730   CINNAMON PO Take by mouth.   11/19/2020 at 0730   Cranberry-Milk Thistle 250-75 MG CAPS Take 1 capsule by mouth 3 (three) times daily.    11/19/2020 at 0730   dapagliflozin propanediol (FARXIGA) 10 MG TABS tablet Take 10 mg by mouth daily.   11/19/2020 at 0730   ferrous sulfate 325 (65 FE) MG tablet Take 325 mg by mouth daily.    11/19/2020 at 0730   gabapentin (NEURONTIN) 100 MG capsule Take 200 mg by mouth at bedtime.    11/18/2020 at pm  levothyroxine (SYNTHROID, LEVOTHROID) 50 MCG tablet Take 50 mcg by mouth daily before breakfast.    11/19/2020 at 0630   Melatonin 10 MG CAPS Take 10 mg by mouth at bedtime.    11/18/2020 at pm   Menthol-Camphor (ICY HOT ADVANCED PAIN RELIEF) 16-11 % CREA Apply 1 application topically as directed.   prn at prn   metoprolol succinate (TOPROL-XL) 25 MG 24 hr tablet Take 12.5 mg by mouth daily.   11/19/2020 at 0730   omeprazole (PRILOSEC) 20 MG capsule Take 20 mg by mouth daily.    11/19/2020 at 0730   potassium chloride SA  (K-DUR,KLOR-CON) 20 MEQ tablet Take 1 tablet (20 mEq total) by mouth 2 (two) times daily. (Patient taking differently: Take 20 mEq by mouth daily.) 14 tablet 0 11/19/2020 at 0730   rifaximin (XIFAXAN) 550 MG TABS tablet Take 550 mg by mouth 2 (two) times daily.    11/19/2020 at 0730   simvastatin (ZOCOR) 20 MG tablet Take 20 mg by mouth at bedtime.    11/18/2020 at 1730   spironolactone (ALDACTONE) 50 MG tablet Take 50 mg by mouth daily.   11/19/2020 at 0730   torsemide (DEMADEX) 20 MG tablet Take 20 mg by mouth 2 (two) times daily.   11/19/2020 at 0730   blood glucose meter kit and supplies KIT Dispense based on patient and insurance preference. Use up to four times daily as directed. (FOR ICD-9 250.00, 250.01). 1 each 0    glipiZIDE (GLUCOTROL XL) 2.5 MG 24 hr tablet Take 2.5 mg by mouth daily with breakfast. (Patient not taking: No sig reported)   Not Taking   Social History   Socioeconomic History   Marital status: Married    Spouse name: [employer]   Number of children: Not on file   Years of education: Not on file   Highest education level: Not on file  Occupational History   Not on file  Tobacco Use   Smoking status: Never   Smokeless tobacco: Never  Vaping Use   Vaping Use: Never used  Substance and Sexual Activity   Alcohol use: No    Alcohol/week: 0.0 standard drinks   Drug use: No   Sexual activity: Not on file  Other Topics Concern   Not on file  Social History Narrative   Not on file   Social Determinants of Health   Financial Resource Strain: Not on file  Food Insecurity: Not on file  Transportation Needs: Not on file  Physical Activity: Not on file  Stress: Not on file  Social Connections: Not on file  Intimate Partner Violence: Not on file    Family History  Problem Relation Age of Onset   Heart disease Other    Hypertension Other    Anemia Other    Colon cancer Other    CVA Mother    Dementia Mother    CAD Father    Breast cancer Neg Hx        Review of systems complete and found to be negative unless listed above    PHYSICAL EXAM  General: Elderly female. NAD.  HEENT:  Hematoma on left forehead. Sutures in place. Neck: JVD elevated with large V wave Lungs: Clear bilaterally to auscultation and percussion. Heart: Bradycardic and irregular . 3/6 systolic murmur. Abdomen: Bowel sounds are positive, abdomen soft and non-tender  Msk:  Back normal, normal gait. Normal strength and tone for age. Extremities: No clubbing, cyanosis or edema.  Pain with palpation of BL  LE. Neuro: Alert and oriented X 3. Psych:  Good affect, responds appropriately  Labs:   Lab Results  Component Value Date   WBC 4.0 11/20/2020   HGB 9.7 (L) 11/20/2020   HCT 28.5 (L) 11/20/2020   MCV 91.6 11/20/2020   PLT 68 (L) 11/20/2020    Recent Labs  Lab 11/19/20 1526 11/20/20 0407 11/21/20 0450  NA  --    < > 137  K  --    < > 4.0  CL  --    < > 100  CO2  --    < > 31  BUN  --    < > 43*  CREATININE  --    < > 1.88*  CALCIUM  --    < > 8.9  PROT 7.2  --   --   BILITOT 3.5*  --   --   ALKPHOS 99  --   --   ALT 33  --   --   AST 54*  --   --   GLUCOSE  --    < > 129*   < > = values in this interval not displayed.    Lab Results  Component Value Date   CKTOTAL 51 04/22/2016   CKMB 1.1 05/25/2011   TROPONINI <0.03 04/22/2016     Lab Results  Component Value Date   CHOL 195 04/23/2016   CHOL 184 11/29/2012   Lab Results  Component Value Date   HDL 43 04/23/2016   HDL 52 11/29/2012   Lab Results  Component Value Date   LDLCALC 138 (H) 04/23/2016   LDLCALC 121 (H) 11/29/2012   Lab Results  Component Value Date   TRIG 70 04/23/2016   TRIG 57 11/29/2012   Lab Results  Component Value Date   CHOLHDL 4.5 04/23/2016   No results found for: LDLDIRECT    Radiology: CT HEAD WO CONTRAST (5MM)  Result Date: 11/20/2020 CLINICAL DATA:  80 year old female status post fall. Small volume subarachnoid and intraventricular  hemorrhage. EXAM: CT HEAD WITHOUT CONTRAST TECHNIQUE: Contiguous axial images were obtained from the base of the skull through the vertex without intravenous contrast. COMPARISON:  11/19/2020 head CTs.  Brain MRI 04/22/2016. FINDINGS: Brain: Small volume left inferior frontal gyrus region subarachnoid hemorrhage is stable since 1549 hours yesterday, with no definite underlying cerebral contusion identified. There is also probably trace subarachnoid hemorrhage in the contrecoup region right parietal lobe series 3, image 21, stable since 2224 hours yesterday. A small volume of intraventricular hemorrhage both layering in the left occipital horn and adherent to the septum pellucidum near the foramen of Monro (series 3, image 14) is stable since 2224 hours yesterday. No other extra-axial blood identified. No midline shift, mass effect, or evidence of intracranial mass lesion. Stable chronic ventriculomegaly. Basilar cisterns remain normal. Stable gray-white matter differentiation throughout the brain. No cortically based acute infarct identified. Vascular: Calcified atherosclerosis at the skull base. No suspicious intracranial vascular hyperdensity. Skull: Stable.  No fracture identified. Sinuses/Orbits: Chronic left sphenoid and right maxillary sinusitis with mucoperiosteal thickening appears stable. Tympanic cavities and mastoids remain clear. Other: Anterior left forehead and periorbital scalp hematoma redemonstrated. Underlying left frontal bone appears intact. Visible orbits soft tissues remain within normal limits. IMPRESSION: 1. Stable since 2224 hours yesterday. Small volume subarachnoid and intraventricular hemorrhage without progression. No intracranial mass effect. 2. Left forehead and periorbital scalp hematoma with No skull fracture identified. 3. No new intracranial abnormality. Electronically Signed   By: Lemmie Evens  [place] Crane M.D.   On: 11/20/2020 05:54   CT HEAD WO CONTRAST (5MM)  Result Date: 11/19/2020 CLINICAL  DATA:  Follow-up subarachnoid hemorrhage EXAM: CT HEAD WITHOUT CONTRAST TECHNIQUE: Contiguous axial images were obtained from the base of the skull through the vertex without intravenous contrast. COMPARISON:  CT from earlier in the same day. FINDINGS: Brain: Persistent left frontal subarachnoid hemorrhage is identified and stable. The right-sided frontal subarachnoid hemorrhage is not well appreciated on this exam. There is increasing attenuation along the septum pellucidum to the left of the midline consistent with focal hemorrhage as well as a small amount of intraventricular blood new from the prior study. No other subarachnoid hemorrhage is noted. Persistent ventricular dilatation is seen. Vascular: No hyperdense vessel or unexpected calcification. Skull: Normal. Negative for fracture or focal lesion. Sinuses/Orbits: No acute finding. Other: None. IMPRESSION: Persistent left frontal subarachnoid hemorrhage is noted and stable. Previously seen right frontal subarachnoid hemorrhage has resolved. There is a new area of hemorrhage along the septum pellucidum to the left of the midline with evidence of pooling of intraventricular blood in the occipital horn of the left lateral ventricle. This area of hemorrhage measures up to 13 mm in dimension. Follow-up is recommended. Critical Value/emergent results were called by telephone at the time of interpretation on 11/19/2020 at 10:40 pm to [facility] the patient's nurse, who verbally acknowledged these results and will relay to Dr. Tobie Poet or other attending physician. Electronically Signed   By: Inez Catalina M.D.   On: 11/19/2020 22:40   CT HEAD WO CONTRAST (5MM)  Result Date: 11/19/2020 CLINICAL DATA:  Found down, left-sided scalp laceration, increasing confusion EXAM: CT HEAD WITHOUT CONTRAST TECHNIQUE: Contiguous axial images were obtained from the base of the skull through the vertex without intravenous contrast. COMPARISON:  07/31/2019 FINDINGS: Brain: There is  increased attenuation within the left frontal lobe, consistent with small area of subarachnoid hemorrhage and possible contusion. Additionally, there is some faint attenuation within right frontal lobe sulci image 17 and image 23, consistent with subarachnoid hemorrhage. No acute infarct. Continued dilation of the lateral ventricles out of proportion to the degree of cerebral atrophy. No mass effect. Vascular: No hyperdense vessel or unexpected calcification. Skull: Left frontal scalp laceration. No underlying fracture. The remainder of the calvarium is unremarkable. Sinuses/Orbits: Inspissated secretions are seen within the right maxillary and left sphenoid sinus, stable. Remaining sinuses are clear. Other: None. IMPRESSION: 1. Subarachnoid hemorrhage and small parenchymal contusion within the left frontal lobe, with trace subarachnoid hemorrhage along the right frontal convexity. No mass effect or midline shift. 2. Left frontal scalp laceration.  No acute fracture. Critical Value/emergent results were called by telephone at the time of interpretation on 11/19/2020 at 4:29 pm to provider DR Tamala Julian, who verbally acknowledged these results. Electronically Signed   By: Randa Ngo M.D.   On: 11/19/2020 16:33   CT Cervical Spine Wo Contrast  Result Date: 11/19/2020 CLINICAL DATA:  [place], found down, left frontal scalp laceration EXAM: CT CERVICAL SPINE WITHOUT CONTRAST TECHNIQUE: Multidetector CT imaging of the cervical spine was performed without intravenous contrast. Multiplanar CT image reconstructions were also generated. COMPARISON:  None. FINDINGS: Alignment: Minimal anterolisthesis of C4 relative to C5 likely due to extensive degenerative change. Otherwise alignment is anatomic. Skull base and vertebrae: No acute fracture. No primary bone lesion or focal pathologic process. Soft tissues and spinal canal: No prevertebral fluid or swelling. No visible canal hematoma. Disc levels: There is extensive multilevel  cervical spondylosis most pronounced at C4-5,  C5-6, and C6-7. Prominent facet hypertrophy on the left at C4-5. Bony fusion across the craniocervical junction. Upper chest: Airway is patent. There are small bilateral pleural effusions, right greater than left. The lung apices are otherwise clear. Other: Reconstructed images demonstrate no additional findings. IMPRESSION: 1. No acute cervical spine fracture. 2. Multilevel cervical spondylosis and facet hypertrophy. 3. Small bilateral pleural effusions, right greater than left. Electronically Signed   By: Randa Ngo M.D.   On: 11/19/2020 16:32   DG Pelvis Portable  Result Date: 11/19/2020 CLINICAL DATA:  Pelvic pain following fall EXAM: PORTABLE PELVIS 1-2 VIEWS COMPARISON:  None. FINDINGS: Normal alignment. No acute fracture or dislocation. Sacroiliac and hip joint spaces are preserved. Soft tissues are unremarkable. IMPRESSION: No acute fracture or dislocation. Electronically Signed   By: Fidela Salisbury M.D.   On: 11/19/2020 19:09   DG Chest Portable 1 View  Result Date: 11/19/2020 CLINICAL DATA:  CHF Worsening renal function Assess fluid volume EXAM: PORTABLE CHEST 1 VIEW COMPARISON:  02/18/2020 FINDINGS: Postsurgical changes of CABG again seen. There is borderline cardiomegaly. Mild pulmonary vascular congestion is seen. Mild bibasilar opacities likely due to atelectasis. IMPRESSION: Mild pulmonary vascular congestion. Electronically Signed   By: Miachel Roux M.D.   On: 11/19/2020 15:50    EKG: Atrial flutter (versus coarse A. fib) with variable block at a rate of 60-70  Echo 11/11/20- Normal EF. Mild RV dysfunction with RVSP = 50 mmHg. Severe TR.   ASSESSMENT AND PLAN:  #Syncope # Atrial flutter vs Fib Unwitnessed episode of syncope; unclear if she truly lost consciousness versus had orthostatic symptoms causing a fall. She has been heavily diuresed recently so she might be intravascularly deplete contributing to such symptoms. Since admission  she remains in slow atrial flutter at a rate of 60-70 (maybe coarse A. fib), so it is possible she had an period of slowed conduction causing a syncopal episode. Unfortunately it is impossible to know the causative event at this time.  - Agree with temporarily holding diuretic (torsemide 40 qAM, 20 qPM) and giving gentle hydration by PO intake. Would resume Monday at 40 mg daily. - Holding metoprolol - Continue cardiac monitoring while inpatient - At discharge will need cardiac event monitor that is live monitored to evaluate for episodes of large pauses. This can't be placed until Monday.   #History of coronary artery disease status post CABG (09/1998 at [facility]) -Not on aspirin 81 mg d/t history of AVM -Continue simvastatin 20 mg daily  #History of heart failure with preserved ejection fraction # Severe TR with pulmonary HTN Unclear if cirrhosis is caused by right heart dysfunction or visa versa. Recently has required high doses of diuretics to maintain normal volume status, though might be over diuresed currently.  -Continue spironolactone 50 mg -Continue Farxiga 10 mg daily -Holding loop diuretic for now. Likely resume 40 mg daily on Monday. Needs close follow up for volume status.    Signed: Andrez Grime MD 11/21/2020, 7:30 AM

## 2020-11-21 NOTE — TOC Transition Note (Signed)
Transition of Care [facility]) - CM/SW Discharge Note   Patient Details  Name: Theresa [name] MRN: [id number] Date of Birth: [birth year]  [facility]) CM/SW Contact:  Izola Price, RN Phone Number: 11/21/2020, 1:48 PM   Clinical Narrative:   Patient was discharge and provider wrote orders to resume [facility] services. Patient was discharged home. Contacted patient mobile and son answered phone and stated [facility] is to contact them Monday for [facility] initiated upon discharge from [place] prior to this admission. Son had no further questions. Patient was in restroom. Simmie Davies RN CM     Final next level of care: Home w Home Health Services Barriers to Discharge: Barriers Resolved   Patient Goals and CMS Choice        Discharge Placement                       Discharge Plan and Services                DME Arranged: N/A DME Agency:  (Per son this was who is to contact them prior to this admission.)         HH Agency:  ([facility] arranged PTA per son)        Social Determinants of Health (SDOH) Interventions     Readmission Risk Interventions No flowsheet data found.

## 2021-05-05 ENCOUNTER — Other Ambulatory Visit: Payer: Self-pay | Admitting: Internal Medicine

## 2021-05-05 DIAGNOSIS — K7460 Unspecified cirrhosis of liver: Secondary | ICD-10-CM

## 2021-05-11 ENCOUNTER — Other Ambulatory Visit: Payer: Self-pay

## 2021-05-11 ENCOUNTER — Ambulatory Visit
Admission: RE | Admit: 2021-05-11 | Discharge: 2021-05-11 | Disposition: A | Discharge status: Home / Self Care | Payer: Medicare Other | Source: Ambulatory Visit | Attending: Internal Medicine | Admitting: Internal Medicine

## 2021-05-11 DIAGNOSIS — K7460 Unspecified cirrhosis of liver: Secondary | ICD-10-CM | POA: Insufficient documentation

## 2021-05-25 IMAGING — DX DG CHEST 1V
1 series · 1 of 1 positions shown · non-contrast
Comparison: 11/04/2019, [DATE] a.m.

CLINICAL DATA: Status post right thoracentesis

EXAM:
CHEST  1 VIEW

[chest ap]
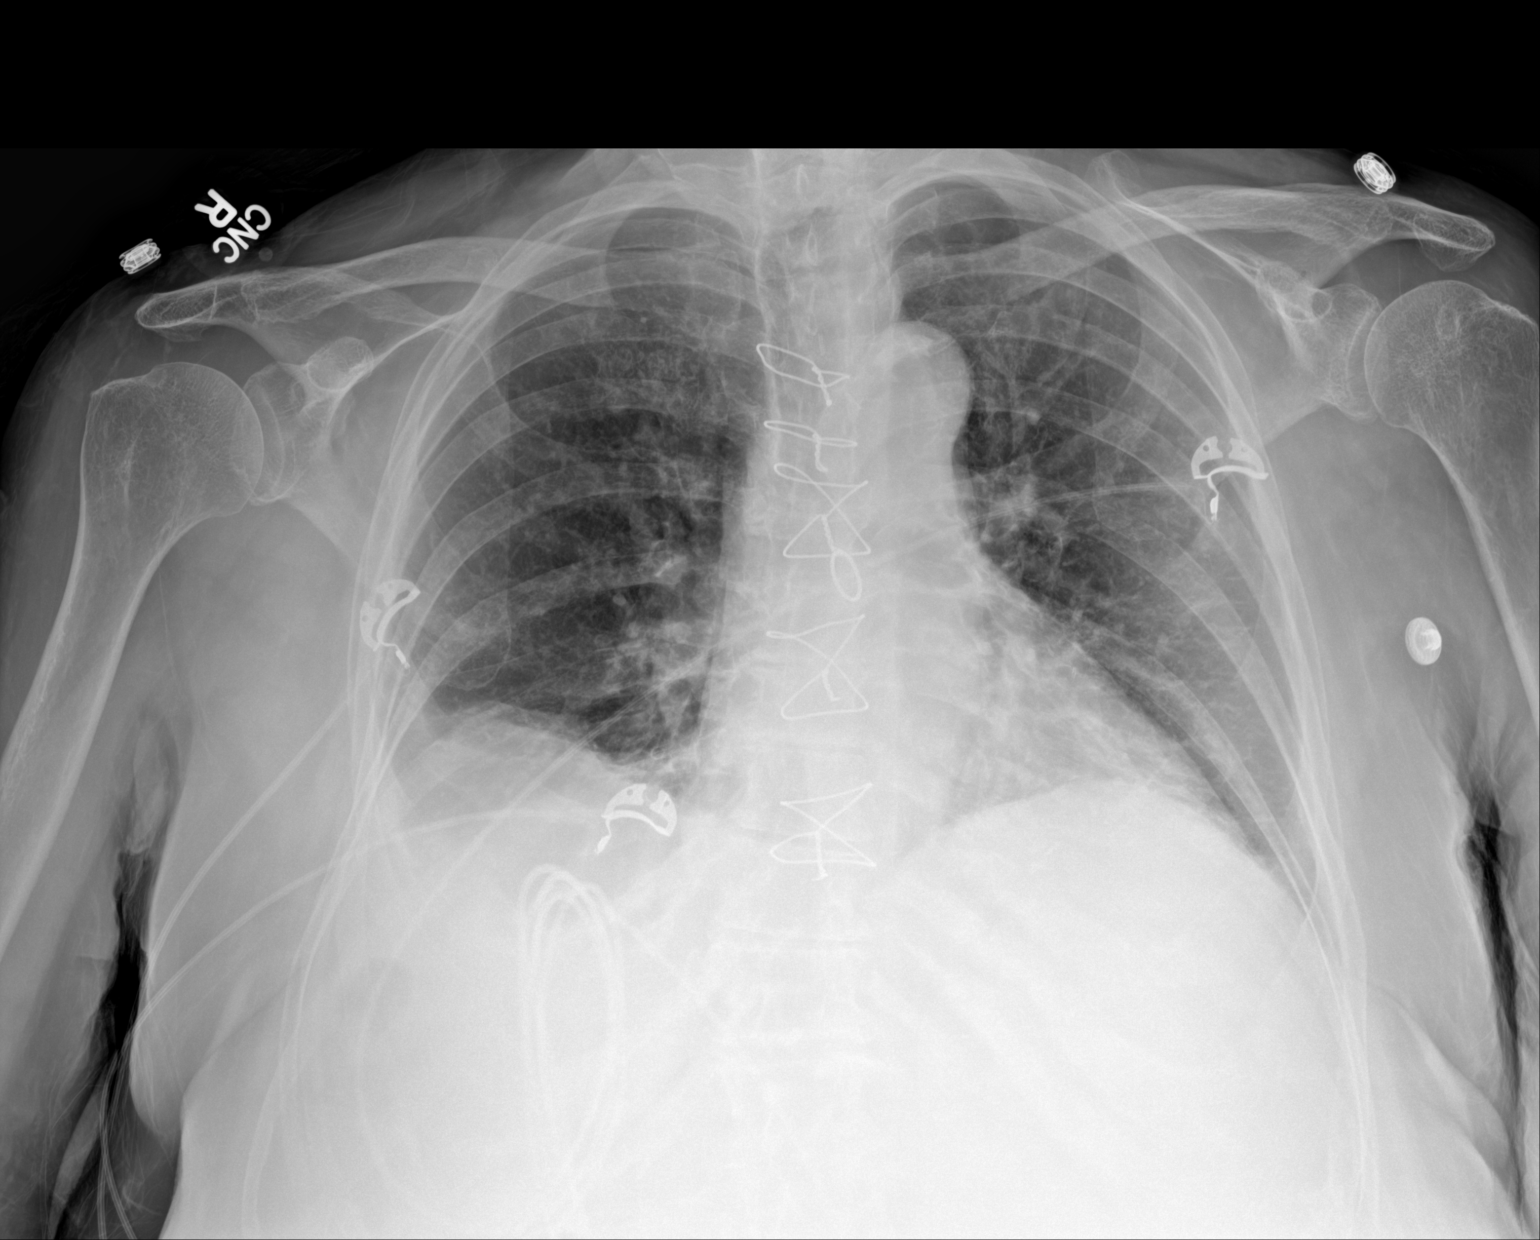

[1 of 1 positions shown; findings below may reference images not displayed]

FINDINGS: Status post right thoracentesis with reduction in volume of a right
pleural effusion and improved aeration of the right lung. No
significant pneumothorax. Cardiomegaly status post median sternotomy
and CABG.
IMPRESSION: Status post right thoracentesis with reduction in volume of a right
pleural effusion and improved aeration of the right lung. No
significant pneumothorax.

## 2021-05-25 IMAGING — DX DG CHEST 1V PORT
1 series · 1 of 1 positions shown · non-contrast
Comparison: 11/01/2019

CLINICAL DATA: Evaluate pleural effusion.

EXAM:
PORTABLE CHEST 1 VIEW

[chest ap]
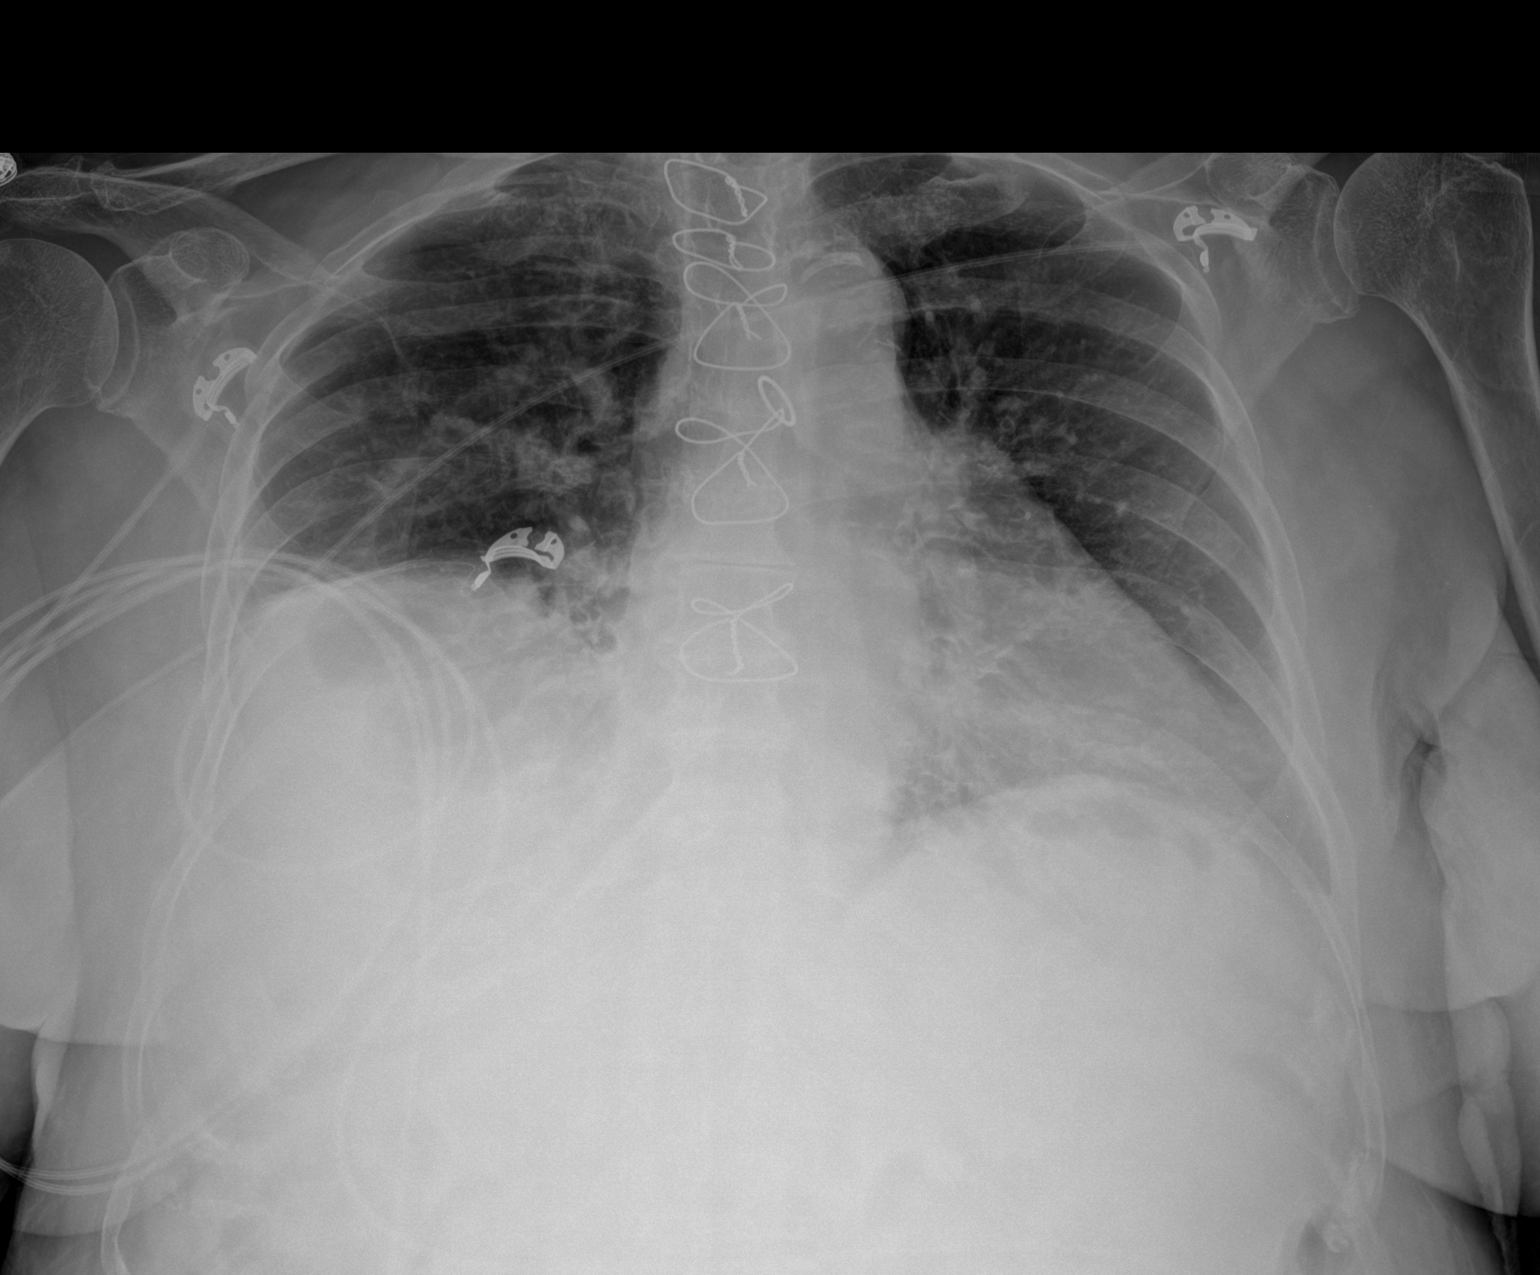

[1 of 1 positions shown; findings below may reference images not displayed]

FINDINGS: Persistent densities in the right lower chest probably related to
pleural fluid and volume loss. There is some elevation of the right
hemidiaphragm. No overt pulmonary edema. Heart size is within normal
limits and stable. Prior median sternotomy.
IMPRESSION: Persistent densities in the right lower chest that are most
compatible with pleural fluid and volume loss. Overall, there has
been minimal change since 11/01/2019.

## 2021-05-25 IMAGING — US US THORACENTESIS ASP PLEURAL SPACE W/IMG GUIDE
1 series · 3 of 3 positions shown · non-contrast
Comparison: none

INDICATION: History of congestive heart failure. Shortness of breath.
Right-sided pleural effusion. Request for diagnostic and therapeutic
thoracentesis.

[Series 1: us thoracentesis asp pleural space w/img guide · 0.25mm/px · 3 of 3 slices shown]
[im 1/3]
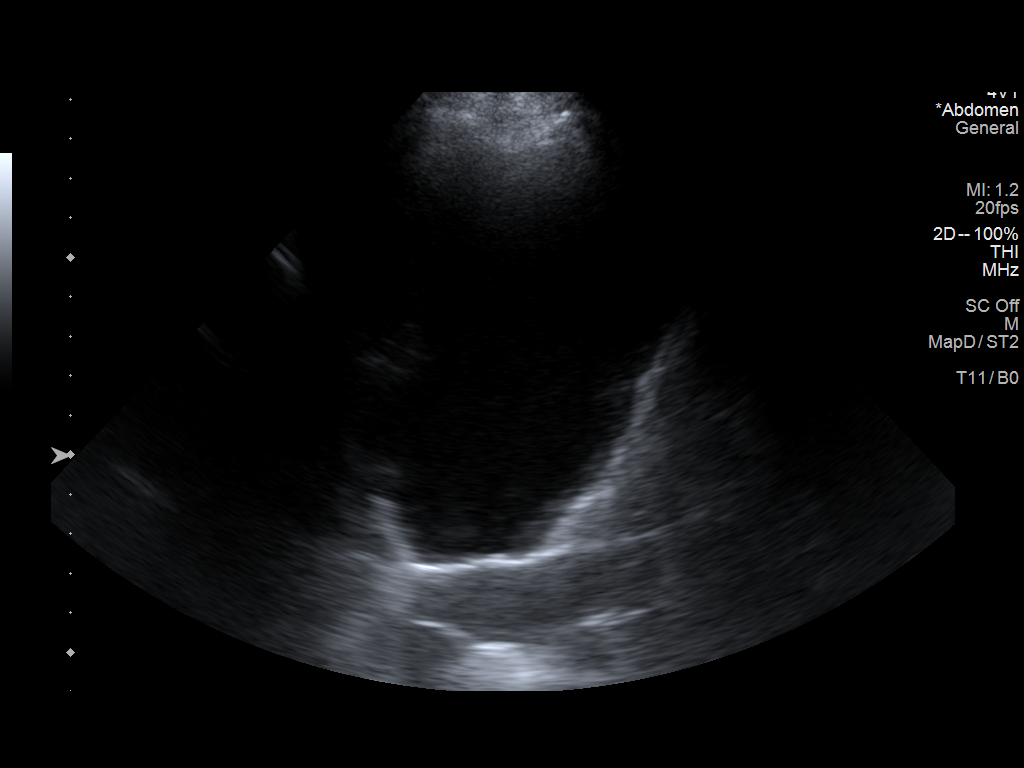
[im 2/3]
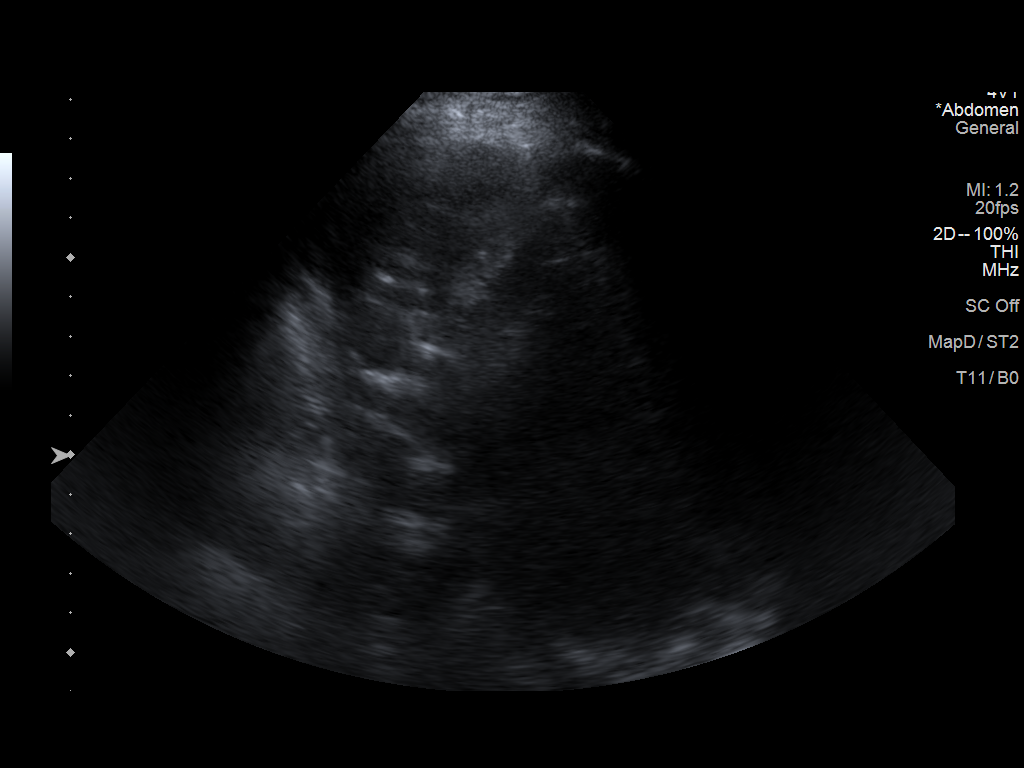
[im 3/3]
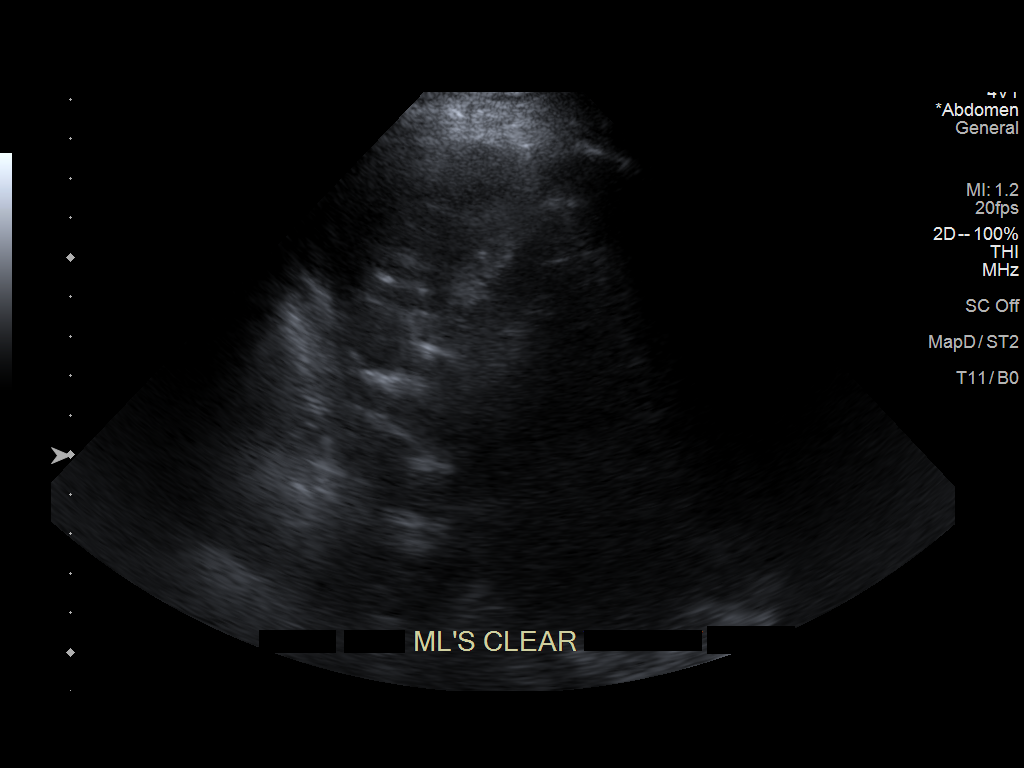

[3 of 3 positions shown; findings below may reference images not displayed]

EXAM:
ULTRASOUND GUIDED RIGHT THORACENTESIS

MEDICATIONS:
None.

COMPLICATIONS:
None immediate.

PROCEDURE:
An ultrasound guided thoracentesis was thoroughly discussed with the
patient and questions answered. The benefits, risks, alternatives
and complications were also discussed. The patient understands and
wishes to proceed with the procedure. Written consent was obtained.

Ultrasound was performed to localize and mark an adequate pocket of
fluid in the right chest. The area was then prepped and draped in
the normal sterile fashion. 1% Lidocaine was used for local
anesthesia. Under ultrasound guidance a 6 Fr Safe-T-Centesis
catheter was introduced. Thoracentesis was performed. The catheter
was removed and a dressing applied.
FINDINGS: A total of approximately 1.2 L of hazy, dark yellow fluid was
removed. Samples were sent to the laboratory as requested by the
clinical team.
IMPRESSION: Successful ultrasound guided right thoracentesis yielding 1.2 L of
pleural fluid.

## 2022-01-27 ENCOUNTER — Encounter: Payer: Medicare Other | Attending: Physician Assistant | Admitting: Physician Assistant

## 2022-01-27 DIAGNOSIS — I872 Venous insufficiency (chronic) (peripheral): Secondary | ICD-10-CM | POA: Insufficient documentation

## 2022-01-27 DIAGNOSIS — E11622 Type 2 diabetes mellitus with other skin ulcer: Secondary | ICD-10-CM | POA: Insufficient documentation

## 2022-01-27 DIAGNOSIS — N183 Chronic kidney disease, stage 3 unspecified: Secondary | ICD-10-CM | POA: Insufficient documentation

## 2022-01-27 DIAGNOSIS — I2510 Atherosclerotic heart disease of native coronary artery without angina pectoris: Secondary | ICD-10-CM | POA: Insufficient documentation

## 2022-01-27 DIAGNOSIS — I480 Paroxysmal atrial fibrillation: Secondary | ICD-10-CM | POA: Insufficient documentation

## 2022-01-27 DIAGNOSIS — E1151 Type 2 diabetes mellitus with diabetic peripheral angiopathy without gangrene: Secondary | ICD-10-CM | POA: Insufficient documentation

## 2022-01-27 DIAGNOSIS — I130 Hypertensive heart and chronic kidney disease with heart failure and stage 1 through stage 4 chronic kidney disease, or unspecified chronic kidney disease: Secondary | ICD-10-CM | POA: Diagnosis not present

## 2022-01-27 DIAGNOSIS — E1122 Type 2 diabetes mellitus with diabetic chronic kidney disease: Secondary | ICD-10-CM | POA: Diagnosis not present

## 2022-01-27 DIAGNOSIS — I5042 Chronic combined systolic (congestive) and diastolic (congestive) heart failure: Secondary | ICD-10-CM | POA: Insufficient documentation

## 2022-01-27 DIAGNOSIS — L97812 Non-pressure chronic ulcer of other part of right lower leg with fat layer exposed: Secondary | ICD-10-CM | POA: Insufficient documentation

## 2022-01-31 NOTE — Progress Notes (Addendum)
AISSA, LISOWSKI ([id number]) [id number] Page 1 of 9 Visit Report for 01/27/2022 Chief Complaint Document Details Patient Name: Date of Service: Theresa [name], Theresa [name]. 01/27/2022 1:00 PM Medical Record Number: [id number] Patient Account Number: [id number] Date of Birth/Sex: Treating RN: March 01, 1941 (81 y.[name]. Theresa [name] Primary Care Provider: Tracie Harrier Other Clinician: Referring Provider: Treating Provider/Extender: Solon Palm Weeks in Treatment: 0 Information Obtained from: Patient Chief Complaint Right LE Ulcer Electronic Signature(s) Signed: 01/27/2022 1:43:51 PM By: Worthy Keeler PA-C Entered By: Worthy Keeler on 01/27/2022 13:43:51 -------------------------------------------------------------------------------- Debridement Details Patient Name: Date of Service: Theresa [name], Theresa [name]. 01/27/2022 1:00 PM Medical Record Number: [id number] Patient Account Number: [id number] Date of Birth/Sex: Treating RN: [birth year] (45 y.[name]. Orvan Falconer Primary Care Provider: Tracie Harrier Other Clinician: Referring Provider: Treating Provider/Extender: Solon Palm Weeks in Treatment: 0 Debridement Performed for Assessment: Wound #1 Right,Lateral Lower Leg Performed By: Physician Tommie Sams., PA-C Debridement Type: Debridement Severity of Tissue Pre Debridement: Fat layer exposed Level of Consciousness (Pre-procedure): Awake and Alert Pre-procedure Verification/Time Out Yes - 13:59 Taken: Start Time: 13:59 Pain Control: Lidocaine 4% T opical Solution T Area Debrided (L x W): otal 3 (cm) x 2 (cm) = 6 (cm) Tissue and other material debrided: Viable, Non-Viable, Slough, Subcutaneous, Slough Level: Skin/Subcutaneous Tissue Debridement Description: Excisional Instrument: Curette Bleeding: Minimum Hemostasis Achieved: Pressure Response to Treatment: Procedure was tolerated well Level of Consciousness (Post- Awake and  Alert procedure): ARIELLAH, FAUST ([id number]) [id number] Page 2 of 9 Post Debridement Measurements of Total Wound Length: (cm) 3 Width: (cm) 2 Depth: (cm) 0.2 Volume: (cm) 0.942 Character of Wound/Ulcer Post Debridement: Stable Severity of Tissue Post Debridement: Fat layer exposed Post Procedure Diagnosis Same as Pre-procedure Electronic Signature(s) Signed: 01/27/2022 5:42:41 PM By: Worthy Keeler PA-C Signed: 01/31/2022 9:30:18 AM By: Carlene Coria RN Entered By: Carlene Coria on 01/27/2022 14:02:57 -------------------------------------------------------------------------------- HPI Details Patient Name: Date of Service: Theresa [name], Theresa [name]. 01/27/2022 1:00 PM Medical Record Number: [id number] Patient Account Number: [id number] Date of Birth/Sex: Treating RN: [birth year] (10 y.[name]. Theresa [name] Primary Care Provider: Tracie Harrier Other Clinician: Referring Provider: Treating Provider/Extender: Solon Palm Weeks in Treatment: 0 History of Present Illness HPI Description: 01-27-2022 upon evaluation today patient presents for initial inspection here in our clinic concerning issues that have been present with a wound over the right lateral lower extremity which the patient states happened about 2 weeks ago on October 20. She tells me that she hit her leg on her little exercise machine that she has been doing following physical therapy. With that being said there was a skin tear they trimmed off the necrotic skin whenever she went to the initial provider visit. Nonetheless unfortunately this area is continued to be very dry and necrotic appearing and I do believe that she needs to have this removed and a dressing in place to keep this from continuing to dry out. She was placed on doxycycline on 01-21-2022 for 5 days but that is currently out I think that probably needs to be continued. Fortunately she has home health with [facility]. Patient does  have a history of peripheral vascular disease based on our ABI screening today she is not terrible as far as the blood flow is concerned but if she does not heal appropriately and quickly we may need to make a referral to vascular for further evaluation in that regard. She also has diabetes mellitus type 2, congestive heart failure, atrial fibrillation,  and stage III chronic kidney disease. Electronic Signature(s) Signed: 01/27/2022 5:27:41 PM By: Worthy Keeler PA-C Entered By: Worthy Keeler on 01/27/2022 17:27:41 -------------------------------------------------------------------------------- Physical Exam Details Patient Name: Date of Service: Theresa [name], Chantil [name]. 01/27/2022 1:00 PM [place], Theresa [name] ([id number]) [id number] Page 3 of 9 Medical Record Number: [id number] Patient Account Number: [id number] Date of Birth/Sex: Treating RN: [birth year] (4 y.[name]. Theresa [name] Primary Care Provider: Tracie Harrier Other Clinician: Referring Provider: Treating Provider/Extender: Solon Palm Weeks in Treatment: 0 Constitutional sitting or standing blood pressure is within target range for patient.. pulse regular and within target range for patient.Marland Kitchen respirations regular, non-labored and within target range for patient.Marland Kitchen temperature within target range for patient.. Well-nourished and well-hydrated in no acute distress. Eyes conjunctiva clear no eyelid edema noted. pupils equal round and reactive to light and accommodation. Ears, Nose, Mouth, and Throat no gross abnormality of ear auricles or external auditory canals. normal hearing noted during conversation. mucus membranes moist. Respiratory normal breathing without difficulty. Cardiovascular 1+ dorsalis pedis/posterior tibialis pulses. trace pitting edema of the bilateral lower extremities. Musculoskeletal Patient unable to walk without assistance. no significant deformity or arthritic changes, no  loss or range of motion, no clubbing. Psychiatric this patient is able to make decisions and demonstrates good insight into disease process. Alert and Oriented x 3. pleasant and cooperative. Notes Upon inspection patient's wound bed actually showed signs of poor granulation and epithelization at this point. Fortunately I do not see any evidence of infection at this time which is great news systemically though locally there is some definite evidence of erythema which I think is of concern at this point. I believe the doxycycline has helped in the patient as well as her son is present with her today feel like the same. Nonetheless I do think that we probably need to continue this for a little bit longer in order to make sure this completely resolves and the patient is in agreement with that plan. Electronic Signature(s) Signed: 01/27/2022 5:28:27 PM By: Worthy Keeler PA-C Entered By: Worthy Keeler on 01/27/2022 17:28:27 -------------------------------------------------------------------------------- Physician Orders Details Patient Name: Date of Service: Theresa [name], Theresa [name]. 01/27/2022 1:00 PM Medical Record Number: [id number] Patient Account Number: [id number] Date of Birth/Sex: Treating RN: [birth year] (37 y.[name]. Orvan Falconer Primary Care Provider: Tracie Harrier Other Clinician: Referring Provider: Treating Provider/Extender: Solon Palm Weeks in Treatment: 0 Verbal / Phone Orders: No Diagnosis Coding ICD-10 Coding Code Description I73.89 Other specified peripheral vascular diseases E11.622 Type 2 diabetes mellitus with other skin ulcer L97.812 Non-pressure chronic ulcer of other part of right lower leg with fat layer exposed I50.42 Chronic combined systolic (congestive) and diastolic (congestive) heart failure I48.0 Paroxysmal atrial fibrillation N18.30 Chronic kidney disease, stage 3 unspecified Follow-up Appointments KETARA, CAVNESS ([id number])  [id number] Page 4 of 9 Wound #1 Right,Lateral Lower Leg Return Appointment in 1 week. Home Health Wound #1 [place]: - [facility] ADMIT to [facility] for wound care. May utilize formulary equivalent dressing for wound treatment orders unless otherwise specified. Home Health Nurse may visit PRN to address patients wound care needs. - Clean the RLL wound with normal saline. Cover the wound with Xeroform, ABD, Kerlix and Net to secure [employer] wounds with antibacterial soap and water. Anesthetic (Use 'Patient Medications' Section for Anesthetic Order Entry) Lidocaine applied to wound bed Medications-Please add to medication list. ntibiotics - Doxycycline P.[name]. A Wound Treatment Wound #1 - Lower  Leg Wound Laterality: Right, Lateral Cleanser: [place] Ancillary Kit - 15 Day Supply (DME) (Generic) 1 x Per Day/30 Days Discharge Instructions: Use supplies as instructed; Kit contains: (15) Saline Bullets; (15) 3x3 Gauze; 15 pr Gloves Cleanser: Soap and Water 1 x Per Day/30 Days Discharge Instructions: Gently cleanse wound with antibacterial soap, rinse and pat dry prior to dressing wounds Prim Dressing: Xeroform 4x4-HBD (in/in) (DME) (Generic) 1 x Per Day/30 Days ary Discharge Instructions: Apply Xeroform 4x4-HBD (in/in) as directed Secondary Dressing: ABD Pad 5x9 (in/in) (DME) (Generic) 1 x Per Day/30 Days Discharge Instructions: Cover with ABD pad Secured With: Kerlix Roll Sterile or Non-Sterile 6-ply 4.5x4 (yd/yd) (DME) (Generic) 1 x Per Day/30 Days Discharge Instructions: Apply Kerlix as directed Secured With: Stretch Net, Size 4, 10 (yds) 1 x Per Day/30 Days Patient Medications llergies: adhesive tape, morphine A Notifications Medication Indication Start End 01/27/2022 doxycycline hyclate DOSE 1 - oral 100 mg capsule - 1 capsule oral taken 2 times per day for 10 days Electronic Signature(s) Signed:  01/31/2022 2:21:51 PM By: Rosalio Loud MSN RN CNS WTA Signed: 02/24/2022 5:46:53 PM By: Worthy Keeler PA-C Previous Signature: 01/27/2022 2:51:44 PM Version By: Worthy Keeler PA-C Entered By: Rosalio Loud on 01/31/2022 12:07:01 -------------------------------------------------------------------------------- Problem List Details Patient Name: Date of Service: Theresa [name], Bernadetta [name]. 01/27/2022 1:00 PM Medical Record Number: [id number] Patient Account Number: [id number] Date of Birth/Sex: Treating RN: 03-03-1941 (48 y.[name]. Theresa [name] Primary Care Provider: Tracie Harrier Other Clinician: Referring Provider: Treating Provider/Extender: Solon Palm [place], Johnette Abraham ([id number]) [id number] Page 5 of 9 Weeks in Treatment: 0 Active Problems ICD-10 Encounter Code Description Active Date MDM Diagnosis I73.89 Other specified peripheral vascular diseases 01/27/2022 No Yes E11.622 Type 2 diabetes mellitus with other skin ulcer 01/27/2022 No Yes L97.812 Non-pressure chronic ulcer of other part of right lower leg with fat layer 01/27/2022 No Yes exposed I50.42 Chronic combined systolic (congestive) and diastolic (congestive) heart failure 01/27/2022 No Yes I48.0 Paroxysmal atrial fibrillation 01/27/2022 No Yes N18.30 Chronic kidney disease, stage 3 unspecified 01/27/2022 No Yes Inactive Problems Resolved Problems Electronic Signature(s) Signed: 01/27/2022 1:43:34 PM By: Worthy Keeler PA-C Entered By: Worthy Keeler on 01/27/2022 13:43:34 -------------------------------------------------------------------------------- Progress Note Details Patient Name: Date of Service: Theresa [name], Theresa [name]. 01/27/2022 1:00 PM Medical Record Number: [id number] Patient Account Number: [id number] Date of Birth/Sex: Treating RN: 16-Dec-1940 (55 y.[name]. Theresa [name] Primary Care Provider: Tracie Harrier Other Clinician: Referring Provider: Treating Provider/Extender: Solon Palm Weeks in Treatment: 0 Subjective Chief Complaint Information obtained from Patient Right LE Ulcer History of Present Illness (HPI) 01-27-2022 upon evaluation today patient presents for initial inspection here in our clinic concerning issues that have been present with a wound over the right lateral lower extremity which the patient states happened about 2 weeks ago on October 20. She tells me that she hit her leg on her little exercise machine that she has been doing following physical therapy. With that being said there was a skin tear they trimmed off the necrotic skin whenever she went to the initial RHILYNN, PREYER ([id number]) [id number] Page 6 of 9 provider visit. Nonetheless unfortunately this area is continued to be very dry and necrotic appearing and I do believe that she needs to have this removed and a dressing in place to keep this from continuing to dry out. She was placed on doxycycline on 01-21-2022 for 5 days but that is currently out I think that probably needs to be  continued. Fortunately she has home health with [facility]. Patient does have a history of peripheral vascular disease based on our ABI screening today she is not terrible as far as the blood flow is concerned but if she does not heal appropriately and quickly we may need to make a referral to vascular for further evaluation in that regard. She also has diabetes mellitus type 2, congestive heart failure, atrial fibrillation, and stage III chronic kidney disease. Patient History Allergies adhesive tape, morphine Social History Never smoker, Marital Status - Married, Alcohol Use - Never, Drug Use - No History, Caffeine Use - Moderate. Medical History Cardiovascular Patient has history of Arrhythmia, Coronary Artery Disease, Hypertension Endocrine Patient has history of Type II Diabetes Patient is treated with Insulin. Review of Systems (ROS) Eyes Complains or  has symptoms of Vision Changes. Integumentary (Skin) Complains or has symptoms of Wounds, Bleeding or bruising tendency. Objective Constitutional sitting or standing blood pressure is within target range for patient.. pulse regular and within target range for patient.Marland Kitchen respirations regular, non-labored and within target range for patient.Marland Kitchen temperature within target range for patient.. Well-nourished and well-hydrated in no acute distress. Vitals Time Taken: 1:11 PM, Height: 60 in, Source: Stated, Weight: 160 lbs, Source: Stated, BMI: 31.2, Temperature: 97.5 F, Pulse: 59 bpm, Respiratory Rate: 16 breaths/min, Blood Pressure: 105/63 mmHg. Eyes conjunctiva clear no eyelid edema noted. pupils equal round and reactive to light and accommodation. Ears, Nose, Mouth, and Throat no gross abnormality of ear auricles or external auditory canals. normal hearing noted during conversation. mucus membranes moist. Respiratory normal breathing without difficulty. Cardiovascular 1+ dorsalis pedis/posterior tibialis pulses. trace pitting edema of the bilateral lower extremities. Musculoskeletal Patient unable to walk without assistance. no significant deformity or arthritic changes, no loss or range of motion, no clubbing. Psychiatric this patient is able to make decisions and demonstrates good insight into disease process. Alert and Oriented x 3. pleasant and cooperative. General Notes: Upon inspection patient's wound bed actually showed signs of poor granulation and epithelization at this point. Fortunately I do not see any evidence of infection at this time which is great news systemically though locally there is some definite evidence of erythema which I think is of concern at this point. I believe the doxycycline has helped in the patient as well as her son is present with her today feel like the same. Nonetheless I do think that we probably need to continue this for a little bit longer in order to make  sure this completely resolves and the patient is in agreement with that plan. Integumentary (Hair, Skin) Wound #1 status is Open. Original cause of wound was Skin T ear/Laceration. The date acquired was: 01/14/2022. The wound is located on the Right,Lateral Lower Leg. The wound measures 3cm length x 2cm width x 0.1cm depth; 4.712cm^2 area and 0.471cm^3 volume. There is Fat Layer (Subcutaneous Tissue) exposed. There is no tunneling or undermining noted. There is a medium amount of serosanguineous drainage noted. The wound margin is distinct with the outline attached to the wound base. There is small (1-33%) red granulation within the wound bed. 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 There was a Excisional Skin/Subcutaneous Tissue Debridement with a total area of 6 sq cm performed by Tommie Sams., PA-C. With the following instrument(s): Curette to remove Viable and Non-Viable tissue/material. Material removed includes Subcutaneous Tissue and Slough and after achieving pain control using Lidocaine 4% Topical Solution. No specimens were taken. A time out was conducted at 13:59, prior to the start of the procedure. A Minimum amount of bleeding was controlled with Pressure. The procedure was tolerated well. Post Debridement Measurements: 3cm length x 2cm width x 0.2cm depth; 0.942cm^3 volume. Character of  Wound/Ulcer Post Debridement is stable. Severity of Tissue Post Debridement is: Fat layer exposed. Post procedure Diagnosis Wound #1: Same as Pre-Procedure Plan Follow-up Appointments: Wound #1 Right,Lateral Lower Leg: Return Appointment in 1 week. Home Health: Wound #1 Right,Lateral Lower Leg: [place]: - [facility] ADMIT to [facility] for wound care. May utilize formulary equivalent dressing for wound treatment orders unless otherwise specified. Home Health Nurse may visit PRN to address patientoos wound care needs. - Clean the RLL wound with normal saline. Cover the wound with Xeroform, ABD, Kerlix and Net to secure Bathing/ Shower/ Hygiene: Wash wounds with antibacterial soap and water. Anesthetic (Use 'Patient Medications' Section for Anesthetic Order Entry): Lidocaine applied to wound bed Medications-Please add to medication list.: P.[name]. Antibiotics - Doxycycline The following medication(s) was prescribed: doxycycline hyclate oral 100 mg capsule 1 1 capsule oral taken 2 times per day for 10 days starting 01/27/2022 WOUND #1: - Lower Leg Wound Laterality: Right, Lateral Cleanser: Soap and Water 1 x Per Day/30 Days Discharge Instructions: Gently cleanse wound with antibacterial soap, rinse and pat dry prior to dressing wounds Prim Dressing: Xeroform 4x4-HBD (in/in) (Home Health) 1 x Per Day/30 Days ary Discharge Instructions: Apply Xeroform 4x4-HBD (in/in) as directed Secondary Dressing: ABD Pad 5x9 (in/in) (Home Health) 1 x Per Day/30 Days Discharge Instructions: Cover with ABD pad Secured With: Kerlix Roll Sterile or Non-Sterile 6-ply 4.5x4 (yd/yd) (Home Health) 1 x Per Day/30 Days Discharge Instructions: Apply Kerlix as directed Secured With: Stretch Net, Size 4, 10 (yds) 1 x Per Day/30 Days 1. I am going to recommend that we go ahead and initiate treatment with a continuation of the doxycycline which I think is doing awesome for her. 2. I am also going to suggest that  we have the patient continue to monitor for any evidence of worsening or infection. Obviously if anything changes she knows to contact the office and let me know. 3. I would also recommend that we have her clean with Dial antibacterial soap which I think is a good option I think she can shower as well. 4. With regard to the dressing I think we need something that will prevent this from drying out and I think Xeroform is going to be the best option at this point patient is in agreement with that plan. We will see patient back for reevaluation in 1 week here in the clinic. If anything worsens or changes patient will contact our office for additional recommendations. Electronic Signature(s) Signed: 01/27/2022 5:29:20 PM By: Worthy Keeler PA-C Entered By: Worthy Keeler on 01/27/2022 17:29:20 Brantley Stage ([id number]) [id number] Page 8 of 9 -------------------------------------------------------------------------------- ROS/PFSH Details Patient Name: Date of Service: Theresa [name], Kalyssa [name]. 01/27/2022 1:00 PM Medical Record Number: [id number] Patient Account Number: [id number] Date of Birth/Sex: Treating RN: Nov 03, 1940 (57 y.[name]. F) Epps, Morey Hummingbird  Primary Care Provider: Tracie Harrier Other Clinician: Referring Provider: Treating Provider/Extender: Nadara Mustard, Vishwanath Weeks in Treatment: 0 Eyes Complaints and Symptoms: Positive for: Vision Changes Integumentary (Skin) Complaints and Symptoms: Positive for: Wounds; Bleeding or bruising tendency Cardiovascular Medical History: Positive for: Arrhythmia; Coronary Artery Disease; Hypertension Endocrine Medical History: Positive for: Type II Diabetes Time with diabetes: 3 Treated with: Insulin Genitourinary Immunizations Pneumococcal Vaccine: Received Pneumococcal Vaccination: Yes Received Pneumococcal Vaccination On or After 60th Birthday: Yes Implantable Devices Yes Family and Social History Never  smoker; Marital Status - Married; Alcohol Use: Never; Drug Use: No History; Caffeine Use: Moderate Electronic Signature(s) Signed: 01/27/2022 5:42:41 PM By: Worthy Keeler PA-C Signed: 01/31/2022 9:30:18 AM By: Carlene Coria RN Entered By: Carlene Coria on 01/27/2022 13:41:42 [place], Johnette Abraham ([id number]) [id number] Page 9 of 9 -------------------------------------------------------------------------------- SuperBill Details Patient Name: Date of Service: Theresa [name], Theresa [name]. 01/27/2022 Medical Record Number: [id number] Patient Account Number: [id number] Date of Birth/Sex: Treating RN: May 31, 1940 (66 y.[name]. 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 I48.0 Paroxysmal atrial fibrillation N18.30 Chronic kidney disease, stage 3 unspecified Facility Procedures : CPT4 Code: [id number] Description: 24268 - DEB SUBQ TISSUE 20 SQ CM/< ICD-10 Diagnosis Description T41.962 Non-pressure chronic ulcer of other part of right lower leg with fat layer exp Modifier: osed Quantity: 1 Physician Procedures : CPT4 Code Description Modifier [id number] 21194 - WC PHYS LEVEL 4 - NEW PT 25 ICD-10 Diagnosis Description I73.89 Other specified peripheral vascular diseases E11.622 Type 2 diabetes mellitus with other skin ulcer L97.812 Non-pressure chronic ulcer of  other part of right lower leg with fat layer exposed I50.42 Chronic combined systolic (congestive) and diastolic (congestive) heart failure Quantity: 1 : [id number] 11042 - WC PHYS SUBQ  TISS 20 SQ CM ICD-10 Diagnosis Description L97.812 Non-pressure chronic ulcer of other part of right lower leg with fat layer exposed Quantity: 1 Electronic Signature(s) Signed: 01/27/2022 5:34:11 PM By: Worthy Keeler PA-C Entered By: Worthy Keeler on 01/27/2022 17:34:11

## 2022-01-31 NOTE — Progress Notes (Signed)
LEILENE, DIPRIMA ([id number]) [id number] Page 1 of 5 Visit Report for 01/27/2022 Abuse Risk Screen Details Patient Name: Date of Service: HO [name], Theresa O. 01/27/2022 1:00 PM Medical Record Number: [id number] Patient Account Number: [id number] Date of Birth/Sex: Treating RN: [birth year] (81 y.o. Orvan Falconer Primary Care Sayre Mazor: Tracie Harrier Other Clinician: Referring Tenleigh Byer: Treating Jujuan Dugo/Extender: Solon Palm Weeks in Treatment: 0 Abuse Risk Screen Items Answer ABUSE RISK SCREEN: Has anyone close to you tried to hurt or harm you recentlyo No Do you feel uncomfortable with anyone in your familyo No Has anyone forced you do things that you didnt want to doo No Electronic Signature(s) Signed: 01/31/2022 9:30:18 AM By: Carlene Coria RN Entered By: Carlene Coria on 01/27/2022 13:41:46 -------------------------------------------------------------------------------- Activities of Daily Living Details Patient Name: Date of Service: HO [name], Theresa O. 01/27/2022 1:00 PM Medical Record Number: [id number] Patient Account Number: [id number] Date of Birth/Sex: Treating RN: Apr 09, 1940 (81 y.o. Orvan Falconer Primary Care Niccole Witthuhn: Tracie Harrier Other Clinician: Referring Canda Podgorski: Treating Sahaana Weitman/Extender: Solon Palm Weeks in Treatment: 0 Activities of Daily Living Items Answer Activities of Daily Living (Please select one for each item) Drive Automobile Not Able T Medications ake Need Assistance Use T elephone Completely Able Care for Appearance Need Assistance Use T oilet Need Assistance Bath / Shower Need Assistance Dress Self Need Assistance Feed Self Completely Able Walk Need Assistance Get In / Out Bed Need Assistance Housework Not Able ZELTA, ENFIELD ([id number]) [id number] Page 2 of 5 Prepare Meals Not Able Handle Money Not Able Shop for Self Not  Able Electronic Signature(s) Signed: 01/31/2022 9:30:18 AM By: Carlene Coria RN Entered By: Carlene Coria on 01/27/2022 13:41:50 -------------------------------------------------------------------------------- Education Screening Details Patient Name: Date of Service: HO [name], Theresa O. 01/27/2022 1:00 PM Medical Record Number: [id number] Patient Account Number: [id number] Date of Birth/Sex: Treating RN: [birth year] (81 y.o. Orvan Falconer Primary Care Titianna Loomis: Tracie Harrier Other Clinician: Referring Kally Cadden: Treating Shaina Gullatt/Extender: Solon Palm Weeks in Treatment: 0 Primary Learner Assessed: Patient Learning Preferences/Education Level/Primary Language Learning Preference: Explanation Highest Education Level: Grade School Preferred Language: English Cognitive Barrier Language Barrier: No Translator Needed: No Memory Deficit: No Emotional Barrier: No Cultural/Religious Beliefs Affecting Medical Care: No Physical Barrier Impaired Vision: Yes Glasses Impaired Hearing: No Decreased Hand dexterity: No Knowledge/Comprehension Knowledge Level: Medium Comprehension Level: High Ability to understand written instructions: High Ability to understand verbal instructions: High Motivation Anxiety Level: Anxious Cooperation: Cooperative Education Importance: Acknowledges Need Interest in Health Problems: Asks Questions Perception: Coherent Willingness to Engage in Self-Management High Activities: Readiness to Engage in Self-Management High Activities: Electronic Signature(s) Signed: 01/31/2022 9:30:18 AM By: Carlene Coria RN Entered By: Carlene Coria on 01/27/2022 13:41:55 Krauss, Johnette Abraham ([id number]) [id number] Page 3 of 5 -------------------------------------------------------------------------------- Fall Risk Assessment Details Patient Name: Date of Service: HO [name], Theresa O. 01/27/2022 1:00 PM Medical Record Number:  [id number] Patient Account Number: [id number] Date of Birth/Sex: Treating RN: Jun 19, 1940 (81 y.o. Orvan Falconer Primary Care Aleksia Freiman: Tracie Harrier Other Clinician: Referring Keiri Solano: Treating Damita Eppard/Extender: Solon Palm Weeks in Treatment: 0 Fall Risk Assessment Items Have you had 2 or more falls in the last 12 monthso 0 Yes Have you had any fall that resulted in injury in the last 12 monthso 0 Yes FALLS RISK SCREEN History of falling - immediate or within 3 months 25 Yes Secondary diagnosis (Do you have 2 or more medical diagnoseso) 0 No Ambulatory aid None/bed rest/wheelchair/nurse 0 No Crutches/cane/walker 15  Yes Furniture 0 No Intravenous therapy Access/Saline/Heparin Lock 0 No Gait/Transferring Normal/ bed rest/ wheelchair 0 No Weak (short steps with or without shuffle, stooped but able to lift head while walking, may seek 10 Yes support from furniture) Impaired (short steps with shuffle, may have difficulty arising from chair, head down, impaired 0 No balance) Mental Status Oriented to own ability 0 No Electronic Signature(s) Signed: 01/31/2022 9:30:18 AM By: Carlene Coria RN Entered By: Carlene Coria on 01/27/2022 13:41:58 -------------------------------------------------------------------------------- Foot Assessment Details Patient Name: Date of Service: HO [name], Theresa O. 01/27/2022 1:00 PM Medical Record Number: [id number] Patient Account Number: [id number] Date of Birth/Sex: Treating RN: December 22, 1940 (81 y.o. Orvan Falconer Primary Care Lakia Gritton: Tracie Harrier Other Clinician: Referring Tab Rylee: Treating Venezia Sargeant/Extender: Nadara Mustard, Vishwanath Weeks in Treatment: 0 Foot Assessment Items Site Locations O'Fallon, Davis O ([id number]) [phone] [id number] Page 4 of 5 + = Sensation present, - = Sensation absent, C = Callus, U = Ulcer R = Redness, W = Warmth, M = Maceration, PU = Pre-ulcerative lesion F =  Fissure, S = Swelling, D = Dryness Assessment Right: Left: Other Deformity: No No Prior Foot Ulcer: No No Prior Amputation: No No Charcot Joint: No No Ambulatory Status: Ambulatory Without Help Gait: Unsteady Electronic Signature(s) Signed: 01/31/2022 9:30:18 AM By: Carlene Coria RN Entered By: Carlene Coria on 01/27/2022 13:42:10 -------------------------------------------------------------------------------- Nutrition Risk Screening Details Patient Name: Date of Service: HO [name], Theresa O. 01/27/2022 1:00 PM Medical Record Number: [id number] Patient Account Number: [id number] Date of Birth/Sex: Treating RN: Aug 16, 1940 (81 y.o. Orvan Falconer Primary Care Frona Yost: Tracie Harrier Other Clinician: Referring Jina Olenick: Treating Indira Sorenson/Extender: Nadara Mustard, Vishwanath Weeks in Treatment: 0 Height (in): 60 Weight (lbs): 160 Body Mass Index (BMI): 31.2 Nutrition Risk Screening Items Score Screening NUTRITION RISK SCREEN: I have an illness or condition that made me change the kind and/or amount of food I eat 2 Yes I eat fewer than two meals per day 0 No I eat few fruits and vegetables, or milk products 0 No I have three or more drinks of beer, liquor or wine almost every day 0 No I have tooth or mouth problems that make it hard for me to eat 0 No I don't always have enough money to buy the food I need 0 No Elsberry, Rion O ([id number]) [id number] Page 5 of 5 I eat alone most of the time 0 No I take three or more different prescribed or over-the-counter drugs a day 1 Yes Without wanting to, I have lost or gained 10 pounds in the last six months 0 No I am not always physically able to shop, cook and/or feed myself 2 Yes Nutrition Protocols Good Risk Protocol Moderate Risk Protocol 0 Provide education on nutrition High Risk Proctocol Risk Level: Moderate Risk Score: 5 Electronic Signature(s) Signed: 01/31/2022 9:30:18 AM By: Carlene Coria  RN Entered By: Carlene Coria on 01/27/2022 13:42:03

## 2022-01-31 NOTE — Progress Notes (Signed)
Theresa [name] ([id number]) [id number] Page 1 of 9 Visit Report for 01/27/2022 Allergy List Details Patient Name: Date of Service: Theresa [name], Theresa [name]. 01/27/2022 1:00 PM Medical Record Number: [id number] Patient Account Number: [id number] Date of Birth/Sex: Treating RN: Feb 28, 1941 (81 y.o. Orvan Falconer Primary Care Teira Arcilla: Tracie Harrier Other Clinician: Referring Rahkeem Senft: Treating Breccan Galant/Extender: Nadara Mustard, Vishwanath Weeks in Treatment: 0 Allergies Active Allergies adhesive tape morphine Allergy Notes Electronic Signature(s) Signed: 01/31/2022 9:30:18 AM By: Carlene Coria RN Entered By: Carlene Coria on 01/27/2022 13:41:33 -------------------------------------------------------------------------------- Arrival Information Details Patient Name: Date of Service: Theresa [name], Theresa O. 01/27/2022 1:00 PM Medical Record Number: [id number] Patient Account Number: [id number] Date of Birth/Sex: Treating RN: 1940/04/12 (81 y.o. Orvan Falconer Primary Care Rameses Ou: Tracie Harrier Other Clinician: Referring Amous Crewe: Treating Denver Bentson/Extender: Solon Palm Weeks in Treatment: 0 Visit Information Patient Arrived: Walker Arrival Time: 13:04 Accompanied By: son Transfer Assistance: None Patient Identification Verified: Yes Secondary Verification Process Completed: Yes Patient Requires Transmission-Based Precautions: No Patient Has Alerts: Yes Patient Alerts: ABI R 0.76 Electronic Signature(s) Vitanza, Nahia O ([id number]) [id number] Page 2 of 9 Signed: 01/31/2022 9:30:18 AM By: Carlene Coria RN Entered By: Carlene Coria on 01/27/2022 13:41:01 -------------------------------------------------------------------------------- Clinic Level of Care Assessment Details Patient Name: Date of Service: Theresa [name], Theresa [name]. 01/27/2022 1:00 PM Medical Record Number: [id number] Patient Account Number: [id number] Date of  Birth/Sex: Treating RN: [birth year] (81 y.o. Orvan Falconer Primary Care Diaz Crago: Tracie Harrier Other Clinician: Referring Emmelina Mcloughlin: Treating Vivia Rosenburg/Extender: Solon Palm Weeks in Treatment: 0 Clinic Level of Care Assessment Items TOOL 1 Quantity Score []  - 0 Use when EandM and Procedure is performed on INITIAL visit ASSESSMENTS - Nursing Assessment / Reassessment []  - 0 General Physical Exam (combine w/ comprehensive assessment (listed just below) when performed on new pt. evals) []  - 0 Comprehensive Assessment (HX, ROS, Risk Assessments, Wounds Hx, etc.) ASSESSMENTS - Wound and Skin Assessment / Reassessment []  - 0 Dermatologic / Skin Assessment (not related to wound area) ASSESSMENTS - Ostomy and/or Continence Assessment and Care []  - 0 Incontinence Assessment and Management []  - 0 Ostomy Care Assessment and Management (repouching, etc.) PROCESS - Coordination of Care []  - 0 Simple Patient / Family Education for ongoing care []  - 0 Complex (extensive) Patient / Family Education for ongoing care []  - 0 Staff obtains Programmer, systems, Records, T Results / Process Orders est []  - 0 Staff telephones HHA, Nursing Homes / Clarify orders / etc []  - 0 Routine Transfer to another Facility (non-emergent condition) []  - 0 Routine Hospital Admission (non-emergent condition) []  - 0 New Admissions / Biomedical engineer / Ordering NPWT Apligraf, etc. , []  - 0 Emergency Hospital Admission (emergent condition) PROCESS - Special Needs []  - 0 Pediatric / Minor Patient Management []  - 0 Isolation Patient Management []  - 0 Hearing / Language / Visual special needs []  - 0 Assessment of Community assistance (transportation, D/C planning, etc.) []  - 0 Additional assistance / Altered mentation []  - 0 Support Surface(s) Assessment (bed, cushion, seat, etc.) INTERVENTIONS - Miscellaneous []  - 0 External ear exam []  - 0 Patient Transfer (multiple staff / Librarian, academic / Similar devices) []  - 0 Simple Staple / Suture removal (25 or less) Splawn, Ayerim O ([id number]) [id number] Page 3 of 9 []  - 0 Complex Staple / Suture removal (26 or more) []  - 0 Hypo/Hyperglycemic Management (do not check if billed separately) []  - 0 Ankle / Brachial Index (ABI) - do not check if  billed separately Has the patient been seen at the hospital within the last three years: Yes Total Score: 0 Level Of Care: ____ Electronic Signature(s) Signed: 01/31/2022 9:30:18 AM By: Carlene Coria RN Entered By: Carlene Coria on 01/27/2022 14:17:44 -------------------------------------------------------------------------------- Encounter Discharge Information Details Patient Name: Date of Service: Theresa [name], Theresa O. 01/27/2022 1:00 PM Medical Record Number: [id number] Patient Account Number: [id number] Date of Birth/Sex: Treating RN: 1940-07-27 (81 y.o. Orvan Falconer Primary Care Klyde Banka: Tracie Harrier Other Clinician: Referring Belina Mandile: Treating Ioannis Schuh/Extender: Solon Palm Weeks in Treatment: 0 Encounter Discharge Information Items Post Procedure Vitals Discharge Condition: Stable Temperature (F): 97.5 Ambulatory Status: Walker Pulse (bpm): 59 Discharge Destination: Home Respiratory Rate (breaths/min): 16 Transportation: Private Auto Blood Pressure (mmHg): 105/63 Accompanied By: son Schedule Follow-up Appointment: Yes Clinical Summary of Care: Electronic Signature(s) Signed: 01/31/2022 9:30:18 AM By: Carlene Coria RN Entered By: Carlene Coria on 01/27/2022 14:35:00 -------------------------------------------------------------------------------- Lower Extremity Assessment Details Patient Name: Date of Service: Theresa [name], Theresa O. 01/27/2022 1:00 PM Medical Record Number: [id number] Patient Account Number: [id number] Date of Birth/Sex: Treating RN: 1940/10/13 (81 y.o. Orvan Falconer Primary Care Abilene Mcphee: Tracie Harrier Other  Clinician: Referring Sharley Keeler: Treating Hadia Minier/Extender: Nadara Mustard, Vishwanath Weeks in Treatment: 0 Edema Assessment H[Left: LANDRIE, BEALE ([id number] [Right: [id number] Page 4 of 9] Assessed: [Left: No] [Right: No] [Left: Edema] [Right: :] Calf Left: Right: Point of Measurement: 30 cm From Medial Instep 39 cm Ankle Left: Right: Point of Measurement: 8 cm From Medial Instep 26 cm Knee To Floor Left: Right: From Medial Instep 40 cm Vascular Assessment Pulses: Dorsalis Pedis Palpable: [Right:Yes] Doppler Audible: [Right:Yes] Blood Pressure: Brachial: [Right:105] Ankle: [Right:Dorsalis Pedis: 80 0.76] Electronic Signature(s) Signed: 01/31/2022 9:30:18 AM By: Carlene Coria RN Entered By: Carlene Coria on 01/27/2022 13:41:30 -------------------------------------------------------------------------------- Multi Wound Chart Details Patient Name: Date of Service: Theresa [name], Tilia O. 01/27/2022 1:00 PM Medical Record Number: [id number] Patient Account Number: [id number] Date of Birth/Sex: Treating RN: 1940-09-15 (81 y.o. 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 Secondary Etiology: Extremity Arrhythmia, Coronary Artery Disease, N/A N/A Comorbid History: Hypertension, Type II Diabetes 01/14/2022 N/A N/A Date Acquired: 0 N/A N/A Weeks of  Treatment: Open N/A N/A Wound Status: No N/A N/A Wound Recurrence: 3x2x0.1 N/A N/A Measurements L x W x D (cm) 4.712 N/A N/A A (cm) : rea 0.471 N/A N/A Volume (cm) : Full Thickness Without Exposed N/A N/A Classification: Support Structures Medium N/A N/A Exudate A mount: Serosanguineous N/A N/A Exudate Type: red, brown N/A N/A Exudate Color: Distinct, outline attached N/A N/A Wound Margin: Small (1-33%) N/A N/A Granulation A mount: Red N/A N/A Granulation Quality: Fat Layer (Subcutaneous Tissue): Yes N/A N/A Exposed Structures: Fascia: No Tendon: No Muscle: No Joint: No Bone: No None N/A N/A Epithelialization: Debridement - Excisional N/A N/A Debridement: Pre-procedure Verification/Time Out 13:59 N/A N/A Taken: Lidocaine 4% Topical Solution N/A N/A Pain Control: Subcutaneous, Slough N/A N/A Tissue Debrided: Skin/Subcutaneous Tissue N/A N/A Level: 6 N/A N/A Debridement A (sq cm): rea Curette N/A N/A Instrument: Minimum N/A N/A Bleeding: Pressure N/A N/A Hemostasis  A chieved: Procedure was tolerated well N/A N/A Debridement Treatment Response: 3x2x0.2 N/A N/A Post Debridement Measurements L x W x D (cm) 0.942 N/A N/A Post Debridement Volume: (cm) Debridement N/A N/A Procedures Performed: Treatment Notes Electronic Signature(s) Signed: 01/31/2022 9:30:18 AM By: Carlene Coria RN Entered By: Carlene Coria on 01/27/2022 14:03:19 -------------------------------------------------------------------------------- Multi-Disciplinary Care Plan Details Patient Name: Date of Service: Theresa [name], Mirca O. 01/27/2022 1:00 PM Medical Record Number: [id number] Patient Account Number: [id number] Date of Birth/Sex: Treating RN: 18-Jun-1940 (81 y.o. Orvan Falconer Primary Care Daesia Zylka: Tracie Harrier Other Clinician: Referring Jujuan Dugo: Treating Savvas Roper/Extender: Nadara Mustard, Vishwanath Weeks in Treatment: 0 Active Inactive Electronic  Signature(s) Signed: 01/31/2022 9:30:18 AM By: Carlene Coria RN Vertell Limber, Johnette Abraham ([id number]) [id number] Page 6 of 9 Entered By: Carlene Coria on 01/27/2022 13:42:16 -------------------------------------------------------------------------------- Pain Assessment Details Patient Name: Date of Service: Theresa [name], Penni O. 01/27/2022 1:00 PM Medical Record Number: [id number] Patient Account Number: [id number] Date of Birth/Sex: Treating RN: [birth year] (81 y.o. Orvan Falconer Primary Care Karys Meckley: Tracie Harrier Other Clinician: Referring Elah Avellino: Treating Hajira Verhagen/Extender: Solon Palm Weeks in Treatment: 0 Active Problems Location of Pain Severity and Description of Pain Patient Has Paino Yes Site Locations With Dressing Change: Yes Duration of the Pain. Constant / Intermittento Constant Rate the pain. Current Pain Level: 4 Worst Pain Level: 8 Least Pain Level: 2 Tolerable Pain Level: 5 Character of Pain Describe the Pain: Throbbing Pain Management and Medication Current Pain Management: Medication: Yes Cold Application: No Rest: Yes Massage: No Activity: No T.E.N.S.: No Heat Application: No Leg drop or elevation: No Is the Current Pain Management Adequate: Inadequate How does your wound impact your activities of daily livingo Sleep: Yes Bathing: No Appetite: No Relationship With Others: No Bladder Continence: No Emotions: No Bowel Continence: No Work: No Toileting: No Drive: No Dressing: No Hobbies: No Electronic Signature(s) Signed: 01/31/2022 9:30:18 AM By: Carlene Coria RN Entered By: Carlene Coria on 01/27/2022 13:28:51 Gehling, Johnette Abraham ([id number]) [id number] Page 7 of 9 -------------------------------------------------------------------------------- Patient/Caregiver Education Details Patient Name: Date of Service: Theresa TOYE, ROUILLARD 11/2/2023andnbsp1:00 PM Medical Record Number:  [id number] Patient Account Number: [id number] Date of Birth/Gender: Treating RN: 26-Dec-1940 (81 y.o. Orvan Falconer Primary Care Physician: Tracie Harrier Other Clinician: Referring Physician: Treating Physician/Extender: Solon Palm Weeks in Treatment: 0 Education Assessment Education Provided To: Patient and Caregiver Education Topics Provided Infection: Handouts: Infection Prevention and Management Methods: Explain/Verbal Responses: State content correctly [place]: o Handouts: [place] o Methods: Explain/Verbal Responses: State content correctly Wound Debridement: Handouts: Wound Debridement Methods: Explain/Verbal Responses: State content correctly Wound/Skin Impairment: Handouts: Caring for Your Ulcer Methods: Explain/Verbal Responses: State content correctly Electronic Signature(s) Signed: 01/31/2022 9:30:18 AM By: Carlene Coria RN Entered By: Carlene Coria on 01/27/2022 14:10:03 -------------------------------------------------------------------------------- Wound Assessment Details Patient Name: Date of Service: Theresa [name], Ailed O. 01/27/2022 1:00 PM Medical Record Number: [id number] Patient Account Number: [id number] Date of Birth/Sex: Treating RN: 11/26/1940 (81 y.o. 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 Event: Skin Tear/Laceration Etiology: Date Acquired: 01/14/2022 Wound Status: Open Weeks Of Treatment: 0 Comorbid Arrhythmia, Coronary Artery Disease, Hypertension, Type Clustered Wound: No History: II  Diabetes Photos  Wound Measurements Length: (cm) 3 Width: (cm) 2 Depth: (cm) 0.1 Area: (cm) 4.712 Volume: (cm) 0.471 % Reduction in Area: % Reduction in Volume: Epithelialization: None Tunneling: No Undermining: No Wound Description Classification: Full Thickness Without Exposed Support Structures Wound Margin: Distinct, outline attached Exudate Amount: Medium Exudate Type: Serosanguineous Exudate Color: red, brown Foul Odor After Cleansing: No Wound Bed Granulation Amount: Small (1-33%) Exposed Structure Granulation Quality: Red Fascia Exposed: No Fat Layer (Subcutaneous Tissue) Exposed: Yes Tendon Exposed: No Muscle Exposed: No Joint Exposed: No Bone Exposed: No Treatment Notes Wound #1 (Lower Leg) Wound Laterality: Right, Lateral Cleanser Soap and Water Discharge Instruction: Gently cleanse wound with antibacterial soap, rinse and pat dry prior to dressing wounds Peri-Wound Care Topical Primary Dressing Xeroform 4x4-HBD (in/in) Discharge Instruction: Apply Xeroform 4x4-HBD (in/in) as directed Secondary Dressing ABD Pad 5x9 (in/in) Discharge Instruction: Cover with ABD pad Secured With Kerlix Roll Sterile or Non-Sterile 6-ply 4.5x4 (yd/yd) Discharge Instruction: Apply Kerlix as directed Stretch Net, Size 4, 10 (yds) Compression Wrap Compression Stockings Lemme, Hennessey O ([id number]) [id number] Page 9 of 9 Add-Ons Electronic Signature(s) Signed: 01/27/2022 1:45:01 PM By: Worthy Keeler PA-C Signed: 01/31/2022 9:30:18 AM By: Carlene Coria RN Entered By: Worthy Keeler on 01/27/2022 13:45:01 -------------------------------------------------------------------------------- Vitals Details Patient Name: Date of Service: Theresa [name], Gayanne O. 01/27/2022 1:00 PM Medical Record Number: [id number] Patient Account Number: [id number] Date of Birth/Sex: Treating RN: [birth year] (81 y.o. Orvan Falconer Primary Care Feather Berrie: Tracie Harrier Other  Clinician: Referring Jeromie Gainor: Treating Halli Equihua/Extender: Nadara Mustard, Vishwanath Weeks in Treatment: 0 Vital Signs Time Taken: 13:11 Temperature (F): 97.5 Height (in): 60 Pulse (bpm): 59 Source: Stated Respiratory Rate (breaths/min): 16 Weight (lbs): 160 Blood Pressure (mmHg): 105/63 Source: Stated Reference Range: 80 - 120 mg / dl Body Mass Index (BMI): 31.2 Electronic Signature(s) Signed: 01/31/2022 9:30:18 AM By: Carlene Coria RN Entered By: Carlene Coria on 01/27/2022 13:13:07

## 2022-02-04 ENCOUNTER — Encounter: Payer: Medicare Other | Admitting: Internal Medicine

## 2022-02-04 DIAGNOSIS — E11622 Type 2 diabetes mellitus with other skin ulcer: Secondary | ICD-10-CM | POA: Diagnosis not present

## 2022-02-04 NOTE — Progress Notes (Signed)
Theresa [name], Theresa [name] ([id number]) [id number] Page 1 of 9 Visit Report for 02/04/2022 Arrival Information Details Patient Name: Date of Service: Theresa [name], Theresa [name] 02/04/2022 10:45 A M Medical Record Number: [id number] Patient Account Number: [id number] Date of Birth/Sex: Treating RN: March 16, 1941 (81 y.o. Theresa [name] Primary Care Mckay Tegtmeyer: Tracie Harrier Other Clinician: Referring Sherrill Buikema: Treating Juanda Luba/Extender: RO BSO N, MICHA EL G Hande, Vishwanath Weeks in Treatment: 1 Visit Information History Since Last Visit Added or deleted any medications: No Patient Arrived: Gilford Rile Any new allergies or adverse reactions: No Arrival Time: 10:36 Had a fall or experienced change in No Accompanied By: husband activities of daily living that may affect Transfer Assistance: None risk of falls: Patient Identification Verified: Yes Hospitalized since last visit: No Secondary Verification Process Completed: Yes Pain Present Now: No Patient Requires Transmission-Based Precautions: No Patient Has Alerts: Yes Patient Alerts: ABI R 0.76 Electronic Signature(s) Signed: 02/04/2022 12:44:55 PM By: Rosalio Loud MSN RN CNS WTA Entered By: Rosalio Loud on 02/04/2022 11:02:12 -------------------------------------------------------------------------------- Clinic Level of Care Assessment Details Patient Name: Date of Service: Theresa [name], [place]. 02/04/2022 10:45 A M Medical Record Number: [id number] Patient Account Number: [id number] Date of Birth/Sex: Treating RN: 1940/05/17 (81 y.o. Theresa [name] Primary Care Raven Furnas: Tracie Harrier Other Clinician: Referring Isador Castille: Treating Jorrell Kuster/Extender: RO BSO N, MICHA EL G Hande, Vishwanath Weeks in Treatment: 1 Clinic Level of Care Assessment Items TOOL 4 Quantity Score X- 1 0 Use when only an EandM is performed on FOLLOW-UP visit ASSESSMENTS - Nursing Assessment / Reassessment X- 1 10 Reassessment of Co-morbidities  (includes updates in patient status) X- 1 5 Reassessment of Adherence to Treatment Plan ASSESSMENTS - Wound and Skin A ssessment / Reassessment X - Simple Wound Assessment / Reassessment - one wound 1 5 [name], Theresa O ([id number]) [id number] Page 2 of 9 _0  - 0 Complex Wound Assessment / Reassessment - multiple wounds _1  - 0 Dermatologic / Skin Assessment (not related to wound area) ASSESSMENTS - Focused Assessment _2  - 0 Circumferential Edema Measurements - multi extremities _3  - 0 Nutritional Assessment / Counseling / Intervention _4  - 0 Lower Extremity Assessment (monofilament, tuning fork, pulses) _5  - 0 Peripheral Arterial Disease Assessment (using hand held doppler) ASSESSMENTS - Ostomy and/or Continence Assessment and Care _6  - 0 Incontinence Assessment and Management _7  - 0 Ostomy Care Assessment and Management (repouching, etc.) PROCESS - Coordination of Care X - Simple Patient / Family Education for ongoing care 1 15 _8  - 0 Complex (extensive) Patient / Family Education for ongoing care X- 1 10 Staff obtains Programmer, systems, Records, T Results / Process Orders est _9  - 0 Staff telephones HHA, Nursing Homes / Clarify orders / etc _10  - 0 Routine Transfer to another Facility (non-emergent condition) _11  - 0 Routine Hospital Admission (non-emergent condition) _12  - 0 New Admissions / Biomedical engineer / Ordering NPWT Apligraf, etc. , _13  - 0 Emergency Hospital Admission (emergent condition) X- 1 10 Simple Discharge Coordination _14  - 0 Complex (extensive) Discharge Coordination PROCESS - Special Needs _15  - 0 Pediatric / Minor Patient Management _16  - 0 Isolation Patient Management _17  - 0 Hearing / Language / Visual special needs _18  - 0 Assessment of Community assistance (transportation, D/C planning, etc.) _19  - 0 Additional assistance / Altered mentation _20  - 0 Support Surface(s) Assessment (bed, cushion, seat, etc.) INTERVENTIONS -  Wound Cleansing / Measurement X - Simple Wound Cleansing - one wound 1 5 _21  - 0 Complex Wound Cleansing - multiple wounds X- 1 5 Wound  Imaging (photographs - any number of wounds) _0  - 0 Wound Tracing (instead of photographs) X- 1 5 Simple Wound Measurement - one wound _1  - 0 Complex Wound Measurement - multiple wounds INTERVENTIONS - Wound Dressings X - Small Wound Dressing one or multiple wounds 1 10 _2  - 0 Medium Wound Dressing one or multiple wounds _3  - 0 Large Wound Dressing one or multiple wounds <MOQHUTMLYYTKPTWS>_5<\/KCLEXNTZGYFVCBSW>_9  - 0 Application of Medications - topical <QPRFFMBWGYKZLDJT>_7<\/SVXBLTJQZESPQZRA>_0  - 0 Application of Medications - injection INTERVENTIONS - Miscellaneous _6  - 0 External ear exam _7  - 0 Specimen Collection (cultures, biopsies, blood, body fluids, etc.) _8  - 0 Specimen(s) / Culture(s) sent or taken to Lab for analysis CLORIS, FLIPPO ([id number]) [id number] Page 3 of 9 _9  - 0 Patient Transfer (multiple staff / Civil Service fast streamer / Similar devices) _10  - 0 Simple Staple / Suture removal (25 or less) _11  - 0 Complex Staple / Suture removal (26 or more) _12  - 0 Hypo / Hyperglycemic Management (close monitor of Blood Glucose) _13  - 0 Ankle / Brachial Index (ABI) - do not check if billed separately X- 1 5 Vital Signs Has the patient been seen at the hospital within the last three years: Yes Total Score: 85 Level Of Care: New/Established - Level 3 Electronic Signature(s) Signed: 02/04/2022 12:44:55 PM By: Rosalio Loud MSN RN CNS WTA Entered By: Rosalio Loud on 02/04/2022 11:05:13 -------------------------------------------------------------------------------- Encounter Discharge Information Details Patient Name: Date of Service: Theresa [name], Theresa O. 02/04/2022 10:45 A M Medical Record Number: [id number] Patient Account Number: [id number] Date of Birth/Sex: Treating RN: [birth year] (81 y.o. Theresa [name] Primary Care Tikita Mabee: Tracie Harrier Other Clinician: Referring Roselani Grajeda: Treating  Mollee Neer/Extender: RO BSO N, MICHA EL G Hande, Vishwanath Weeks in Treatment: 1 Encounter Discharge Information Items Discharge Condition: Stable Ambulatory Status: Walker Discharge Destination: Home Transportation: Private Auto Accompanied By: husband Schedule Follow-up Appointment: Yes Clinical Summary of Care: Electronic Signature(s) Signed: 02/04/2022 12:40:15 PM By: Rosalio Loud MSN RN CNS WTA Entered By: Rosalio Loud on 02/04/2022 12:40:14 -------------------------------------------------------------------------------- Lower Extremity Assessment Details Patient Name: Date of Service: Theresa [name], Theresa O. 02/04/2022 10:45 A M Medical Record Number: [id number] Patient Account Number: [id number] Date of Birth/Sex: Treating RN: 03/19/1941 (81 y.o. Theresa [name] Primary Care Ebany Bowermaster: Tracie Harrier Other Clinician: BROOKE, STEINHILBER ([id number]) [id number] Page 4 of 9 Referring Brenlynn Fake: Treating Beverly Suriano/Extender: RO BSO N, MICHA EL G Hande, Vishwanath Weeks in Treatment: 1 Edema Assessment Assessed: [Left: No] [Right: No] [Left: Edema] [Right: :] Calf Left: Right: Point of Measurement: 30 cm From Medial Instep 38 cm Ankle Left: Right: Point of Measurement: 8 cm From Medial Instep 25.5 cm Vascular Assessment Pulses: Dorsalis Pedis Palpable: [Right:Yes] Electronic Signature(s) Signed: 02/04/2022 12:44:55 PM By: Rosalio Loud MSN RN CNS WTA Previous Signature: 02/04/2022 10:53:07 AM Version By: Rosalio Loud MSN RN CNS WTA Entered By: Rosalio Loud on 02/04/2022 11:02:31 -------------------------------------------------------------------------------- Multi Wound Chart Details Patient Name: Date of Service: Theresa [name], Theresa O. 02/04/2022 10:45 A M Medical Record Number: [id number] Patient Account Number: [id number] Date of Birth/Sex: Treating RN: January 14, 1941 (81 y.o. 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 Secondary Etiology: Extremity ERCEL, NORMOYLE ([id number]) [id number] Page 5 of 9 Arrhythmia, Coronary Artery Disease, N/A N/A Comorbid History: Hypertension, Type II Diabetes 01/14/2022 N/A N/A Date Acquired: 1 N/A N/A Weeks of Treatment: Open N/A N/A Wound Status: No N/A N/A Wound Recurrence: 3x2x0.1 N/A N/A Measurements L x W x D (cm) 4.712 N/A N/A A (cm) : rea 0.471 N/A N/A Volume (cm) : 0.00% N/A N/A % Reduction in Area: 0.00% N/A N/A % Reduction in Volume: Full Thickness Without Exposed N/A N/A Classification: Support Structures Medium N/A N/A Exudate Amount: Serosanguineous N/A N/A Exudate Type: red, brown N/A N/A Exudate Color: Distinct, outline attached N/A N/A Wound Margin: Small (1-33%) N/A N/A Granulation Amount: Red N/A N/A Granulation Quality: Fat Layer (Subcutaneous Tissue): Yes N/A N/A Exposed Structures: Fascia: No Tendon: No Muscle: No Joint: No Bone: No None N/A N/A Epithelialization: Treatment Notes Electronic Signature(s) Signed: 02/04/2022 12:44:55 PM By: Rosalio Loud MSN RN CNS WTA Previous Signature: 02/04/2022 10:56:14 AM Version By: Rosalio Loud MSN RN CNS WTA Entered By: Rosalio Loud on 02/04/2022 11:03:05 -------------------------------------------------------------------------------- Multi-Disciplinary Care Plan Details Patient Name: Date of Service: Theresa [name], Theresa O. 02/04/2022 10:45 A M Medical Record  Number: [id number] Patient Account Number: [id number] Date of Birth/Sex: Treating RN: 1940/06/28 (81 y.o. Theresa [name] Primary Care Orie Baxendale: Tracie Harrier Other Clinician: Referring Ilyanna Baillargeon: Treating Tomasina Keasling/Extender: RO BSO N, MICHA EL G Hande, Vishwanath Weeks in Treatment: 1 Active Inactive Necrotic Tissue Nursing Diagnoses: Impaired tissue integrity related to necrotic/devitalized tissue Knowledge deficit related to management of necrotic/devitalized tissue Goals: Necrotic/devitalized tissue will be minimized in the wound bed Date Initiated: 02/04/2022 Target Resolution Date: 03/06/2022 Goal Status: Active Patient/caregiver will verbalize understanding of reason and process for debridement of necrotic tissue Date Initiated: 02/04/2022 Target Resolution Date: 03/06/2022 Goal Status: Active Interventions: Assess patient pain level pre-, during and post procedure and prior to discharge Provide education on necrotic tissue and debridement process MUSKAAN, SMET ([id number]) [id number] Page 6 of 9 Treatment Activities: Apply topical anesthetic as ordered : 02/04/2022 Notes: Wound/Skin Impairment Nursing Diagnoses: Impaired tissue integrity Knowledge deficit related to ulceration/compromised skin integrity Goals: Patient will demonstrate a reduced rate of smoking or cessation of smoking Date Initiated: 02/04/2022 Target Resolution Date: 03/06/2022 Goal Status: Active Patient will have a decrease in wound volume by X% from date: (specify in notes) Date Initiated: 02/04/2022 Target Resolution Date: 03/06/2022 Goal Status: Active Patient/caregiver will verbalize understanding of skin care regimen Date Initiated: 02/04/2022 Target Resolution Date: 03/06/2022 Goal Status: Active Ulcer/skin breakdown will have a volume reduction of 30% by week 4 Date Initiated: 02/04/2022 Target Resolution Date: 03/06/2022 Goal Status: Active Ulcer/skin  breakdown will have a volume reduction of 50% by week 8 Date Initiated: 02/04/2022 Target Resolution Date: 04/06/2022 Goal Status: Active Ulcer/skin breakdown will have a volume reduction of 80% by week 12 Date Initiated: 02/04/2022 Target Resolution Date: 05/07/2022 Goal Status: Active Ulcer/skin breakdown will heal within 14 weeks Date Initiated: 02/04/2022 Target Resolution Date: 05/21/2022 Goal Status: Active Interventions: Assess patient/caregiver ability to obtain necessary supplies Assess patient/caregiver ability to perform ulcer/skin care regimen upon admission and as needed Assess ulceration(s) every visit Provide education on ulcer and skin care Treatment Activities: Referred to DME Deyani Hegarty for dressing supplies : 02/04/2022 Skin care regimen initiated : 02/04/2022 Notes: Electronic Signature(s) Signed: 02/04/2022 12:44:55 PM By: Rosalio Loud MSN RN CNS WTA Previous Signature: 02/04/2022 10:55:15 AM Version By: Rosalio Loud MSN RN CNS WTA Entered By: Rosalio Loud on 02/04/2022 11:02:38 --------------------------------------------------------------------------------  Pain Assessment Details Patient Name: Date of Service: Theresa [name], BACH. 02/04/2022 10:45 A M Medical Record Number: [id number] Patient Account Number: [id number] Date of Birth/Sex: Treating RN: [birth year] (81 y.o. Theresa [name] Primary Care Maico Mulvehill: Tracie Harrier Other Clinician: Referring John Vasconcelos: Treating Zadkiel Dragan/Extender: RO BSO N, MICHA EL G Hande, Vishwanath Weeks in Treatment: 1 Active Problems Theresa [name], Theresa O ([id number]) [id number] Page 7 of 9 Location of Pain Severity and Description of Pain Patient Has Paino Yes Site Locations Pain Location: Pain in Ulcers With Dressing Change: Yes Duration of the Pain. Constant / Intermittento Intermittent Rate the pain. Current Pain Level: 6 Worst Pain Level: 8 Least Pain Level: 1 Tolerable Pain Level: 3 Character of  Pain Describe the Pain: Dull, Shooting, Stabbing, Throbbing Pain Management and Medication Current Pain Management: Electronic Signature(s) Signed: 02/04/2022 12:44:55 PM By: Rosalio Loud MSN RN CNS WTA Entered By: Rosalio Loud on 02/04/2022 11:02:21 -------------------------------------------------------------------------------- Patient/Caregiver Education Details Patient Name: Date of Service: Theresa [name], [place] 11/10/2023andnbsp10:45 A M Medical Record Number: [id number] Patient Account Number: [id number] Date of Birth/Gender: Treating RN: 1940/04/08 (81 y.o. Theresa [name] Primary Care Physician: Tracie Harrier Other Clinician: Referring Physician: Treating Physician/Extender: RO BSO N, MICHA EL G Hande, Vishwanath Weeks in Treatment: 1 Education Assessment Education Provided To: Patient Education Topics Provided Wound/Skin Impairment: Handouts: Caring for Your Ulcer Methods: Explain/Verbal Responses: State content correctly Electronic Signature(s) Signed: 02/04/2022 12:44:55 PM By: Rosalio Loud MSN RN CNS WTA Entered By: Rosalio Loud on 02/04/2022 11:05:33 Theresa [name], Theresa [name] ([id number]) [id number] Page 8 of 9 -------------------------------------------------------------------------------- Wound Assessment Details Patient Name: Date of Service: Theresa [name], Theresa COZZA. 02/04/2022 10:45 A M Medical Record Number: [id number] Patient Account Number: [id number] Date of Birth/Sex: Treating RN: 14-Sep-1940 (81 y.o. 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 Wounding Event: Skin Tear/Laceration Etiology: Date Acquired: 01/14/2022 Wound Status:  Open Weeks Of Treatment: 1 Comorbid Arrhythmia, Coronary Artery Disease, Hypertension, Type Clustered Wound: No History: II Diabetes Photos Wound Measurements Length: (cm) 3 Width: (cm) 2 Depth: (cm) 0.1 Area: (cm) 4.712 Volume: (cm) 0.471 % Reduction in Area: 0% % Reduction in Volume: 0% Epithelialization: None Wound Description Classification: Full Thickness Without Exposed Suppor Wound Margin: Distinct, outline attached Exudate Amount: Medium Exudate Type: Serosanguineous Exudate Color: red, brown t Structures Foul Odor After Cleansing: No Wound Bed Granulation Amount: Small (1-33%) Exposed Structure Granulation Quality: Red Fascia Exposed: No Fat Layer (Subcutaneous Tissue) Exposed: Yes Tendon Exposed: No Muscle Exposed: No Joint Exposed: No Bone Exposed: No Treatment Notes Wound #1 (Lower Leg) Wound Laterality: Right, Lateral Cleanser [place] Anc[facility] Kit - [address] Theresa [name], Theresa [name] ([id number]) [id number] Page 9 of 9 Discharge Instruction: Use supplies as instructed; Kit contains: (15) Saline Bullets; (15) 3x3 Gauze; 15 pr Gloves Soap and Water Discharge Instruction: Gently cleanse wound with antibacterial soap, rinse and pat dry prior to dressing wounds Peri-Wound Care Topical Primary Dressing Xeroform 4x4-HBD (in/in) Discharge Instruction: Apply Xeroform 4x4-HBD (in/in) as directed Secondary Dressing ABD Pad 5x9 (in/in) Discharge Instruction: Cover with ABD pad Secured With Kerlix Roll Sterile or Non-Sterile 6-ply 4.5x4 (yd/yd) Discharge Instruction: Apply Kerlix as directed Stretch Net, Size 4, 10 (yds) Compression Wrap Compression Stockings Add-Ons Electronic Signature(s) Signed: 02/04/2022 12:44:55 PM By: Rosalio Loud MSN RN CNS WTA  Entered By: Rosalio Loud on 02/04/2022 10:48:10 -------------------------------------------------------------------------------- Vitals Details Patient Name: Date of Service: Theresa [name],  Theresa [name]. 02/04/2022 10:45 A M Medical Record Number: [id number] Patient Account Number: [id number] Date of Birth/Sex: Treating RN: 1941/02/01 (81 y.o. Theresa [name] Primary Care Yolande Skoda: Tracie Harrier Other Clinician: Referring Cailee Blanke: Treating Quinnlan Abruzzo/Extender: RO BSO N, MICHA EL G Hande, Vishwanath Weeks in Treatment: 1 Vital Signs Time Taken: 10:39 Temperature (F): 97.2 Height (in): 60 Pulse (bpm): 72 Weight (lbs): 160 Respiratory Rate (breaths/min): 16 Body Mass Index (BMI): 31.2 Blood Pressure (mmHg): 123/59 Reference Range: 80 - 120 mg / dl Electronic Signature(s) Signed: 02/04/2022 12:44:55 PM By: Rosalio Loud MSN RN CNS WTA Entered By: Rosalio Loud on 02/04/2022 11:02:17
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
## 2022-02-11 ENCOUNTER — Encounter: Payer: Medicare Other | Admitting: Physician Assistant

## 2022-02-11 DIAGNOSIS — E11622 Type 2 diabetes mellitus with other skin ulcer: Secondary | ICD-10-CM | POA: Diagnosis not present

## 2022-02-11 NOTE — Progress Notes (Addendum)
HEAVEN, MEEKER ([id number]) [id number] Page 1 of 9 Visit Report for 02/11/2022 Arrival Information Details Patient Name: Date of Service: HO MARYTZA, GRANDPRE 02/11/2022 9:15 A M Medical Record Number: [id number] Patient Account Number: [id number] Date of Birth/Sex: Treating RN: Dec 24, 1940 (81 y.o. Drema Pry Primary Care Jaydien Panepinto: Tracie Harrier Other Clinician: Referring Temiloluwa Recchia: Treating Milena Liggett/Extender: Solon Palm Weeks in Treatment: 2 Visit Information History Since Last Visit Added or deleted any medications: No Patient Arrived: Walker Any new allergies or adverse reactions: No Arrival Time: 09:26 Signs or symptoms of abuse/neglect since last visito No Accompanied By: husband Hospitalized since last visit: No Transfer Assistance: None Pain Present Now: No Patient Identification Verified: Yes Secondary Verification Process Completed: Yes Patient Requires Transmission-Based Precautions: No Patient Has Alerts: Yes Patient Alerts: ABI R 0.76 Electronic Signature(s) Signed: 02/11/2022 9:53:16 AM By: Rosalio Loud MSN RN CNS WTA Entered By: Rosalio Loud on 02/11/2022 09:53:16 -------------------------------------------------------------------------------- Clinic Level of Care Assessment Details Patient Name: Date of Service: [name], [place] 02/11/2022 9:15 A M Medical Record Number: [id number] Patient Account Number: [id number] Date of Birth/Sex: Treating RN: 1940/11/06 (81 y.o. Drema Pry Primary Care Jak Haggar: Tracie Harrier Other Clinician: Referring Jaxxen Voong: Treating Kristen Fromm/Extender: Solon Palm Weeks in Treatment: 2 Clinic Level of Care Assessment Items TOOL 4 Quantity Score X- 1 0 Use when only an EandM is performed on FOLLOW-UP visit ASSESSMENTS - Nursing Assessment / Reassessment X- 1 10 Reassessment of Co-morbidities (includes updates in patient status) X- 1 5 Reassessment of  Adherence to Treatment Plan ASSESSMENTS - Wound and Skin A ssessment / Reassessment _0  - 0 Simple Wound Assessment / Reassessment - one wound Muise, Jhene O ([id number]) [id number] Page 2 of 9 _1  - 0 Complex Wound Assessment / Reassessment - multiple wounds _2  - 0 Dermatologic / Skin Assessment (not related to wound area) ASSESSMENTS - Focused Assessment _3  - 0 Circumferential Edema Measurements - multi extremities _4  - 0 Nutritional Assessment / Counseling / Intervention _5  - 0 Lower Extremity Assessment (monofilament, tuning fork, pulses) _6  - 0 Peripheral Arterial Disease Assessment (using hand held doppler) ASSESSMENTS - Ostomy and/or Continence Assessment and Care _7  - 0 Incontinence Assessment and Management _8  - 0 Ostomy Care Assessment and Management (repouching, etc.) PROCESS - Coordination of Care X - Simple Patient / Family Education for ongoing care 1 15 _9  - 0 Complex (extensive) Patient / Family Education for ongoing care X- 1 10 Staff obtains Programmer, systems, Records, T Results / Process Orders est _10  - 0 Staff telephones HHA, Nursing Homes / Clarify orders / etc _11  - 0 Routine Transfer to another Facility (non-emergent condition) _12  - 0 Routine Hospital Admission (non-emergent condition) _13  - 0 New Admissions / Biomedical engineer / Ordering NPWT Apligraf, etc. , _14  - 0 Emergency Hospital Admission (emergent condition) X- 1 10 Simple Discharge Coordination _15  - 0 Complex (extensive) Discharge Coordination PROCESS - Special Needs _16  - 0 Pediatric / Minor Patient Management _17  - 0 Isolation Patient Management _18  - 0 Hearing / Language / Visual special needs _19  - 0 Assessment of Community assistance (transportation, D/C planning, etc.) _20  - 0 Additional assistance / Altered mentation _21  - 0 Support Surface(s) Assessment (bed, cushion, seat, etc.) INTERVENTIONS - Wound Cleansing / Measurement X - Simple Wound Cleansing -  one wound 1 5 _22  - 0 Complex Wound Cleansing - multiple wounds X- 1 5 Wound Imaging (photographs - any number of wounds) _23  - 0 Wound Tracing (instead of photographs) X- 1 5  Simple Wound Measurement - one wound _0  - 0 Complex Wound Measurement - multiple wounds INTERVENTIONS - Wound Dressings X - Small Wound Dressing one or multiple wounds 1 10 _1  - 0 Medium Wound Dressing one or multiple wounds _2  - 0 Large Wound Dressing one or multiple wounds <JYNWGNFAOZHYQMVH>_8<\/IONGEXBMWUXLKGMW>_1  - 0 Application of Medications - topical <UUVOZDGUYQIHKVQQ>_5<\/ZDGLOVFIEPPIRJJO>_8  - 0 Application of Medications - injection INTERVENTIONS - Miscellaneous _5  - 0 External ear exam _6  - 0 Specimen Collection (cultures, biopsies, blood, body fluids, etc.) _7  - 0 Specimen(s) / Culture(s) sent or taken to Lab for analysis RONITA, HARGREAVES ([id number]) [id number] Page 3 of 9 _8  - 0 Patient Transfer (multiple staff / Civil Service fast streamer / Similar devices) _9  - 0 Simple Staple / Suture removal (25 or less) _10  - 0 Complex Staple / Suture removal (26 or more) _11  - 0 Hypo / Hyperglycemic Management (close monitor of Blood Glucose) _12  - 0 Ankle / Brachial Index (ABI) - do not check if billed separately X- 1 5 Vital Signs Has the patient been seen at the hospital within the last three years: Yes Total Score: 80 Level Of Care: New/Established - Level 3 Electronic Signature(s) Signed: 02/11/2022 1:24:40 PM By: Rosalio Loud MSN RN CNS WTA Entered By: Rosalio Loud on 02/11/2022 09:51:33 -------------------------------------------------------------------------------- Encounter Discharge Information Details Patient Name: Date of Service: [name], Amairani O. 02/11/2022 9:15 A M Medical Record Number: [id number] Patient Account Number: [id number] Date of Birth/Sex: Treating RN: 07-29-1940 (81 y.o. Drema Pry Primary Care Brysyn Brandenberger: Tracie Harrier Other Clinician: Referring Jariyah Hackley: Treating Milcah Dulany/Extender: Solon Palm Weeks in  Treatment: 2 Encounter Discharge Information Items Discharge Condition: Stable Ambulatory Status: Wheelchair Discharge Destination: Home Transportation: Private Auto Accompanied By: husband Schedule Follow-up Appointment: No Clinical Summary of Care: Electronic Signature(s) Signed: 02/11/2022 1:24:40 PM By: Rosalio Loud MSN RN CNS WTA Previous Signature: 02/11/2022 9:52:56 AM Version By: Rosalio Loud MSN RN CNS WTA Entered By: Rosalio Loud on 02/11/2022 10:00:45 -------------------------------------------------------------------------------- Lower Extremity Assessment Details Patient Name: Date of Service: [name], Corra O. 02/11/2022 9:15 A M Medical Record Number: [id number] Patient Account Number: [id number] Date of Birth/Sex: Treating RN: 31-Oct-1940 (81 y.o. [place], Pflug, Bentley Jenetta Downer ([id number]) [id number] Page 4 of 9 Primary Care Lynnea Vandervoort: Tracie Harrier Other Clinician: Referring Faraaz Wolin: Treating Raymonde Hamblin/Extender: Nadara Mustard, Vishwanath Weeks in Treatment: 2 Edema Assessment Assessed: [Left: No] [Right: No] [Left: Edema] [Right: :] Calf Left: Right: Point of Measurement: 30 cm From Medial Instep 39 cm Ankle Left: Right: Point of Measurement: 8 cm From Medial Instep 25.5 cm Vascular Assessment Pulses: Dorsalis Pedis Palpable: [Right:Yes] Electronic Signature(s) Signed: 02/11/2022 9:53:40 AM By: Rosalio Loud MSN RN CNS WTA Entered By: Rosalio Loud on 02/11/2022 09:53:40 -------------------------------------------------------------------------------- Multi Wound Chart Details Patient Name: Date of Service: [name], Trinh O. 02/11/2022 9:15 A M Medical Record Number: [id number] Patient Account Number: [id number] Date of Birth/Sex: Treating RN: May 04, 1940 (81 y.o. Drema Pry Primary Care Brenee Gajda: Tracie Harrier Other Clinician: Referring Yurika Pereda: Treating Aspin Palomarez/Extender: Solon Palm Weeks in  Treatment: 2 Vital Signs Height(in): 60 Pulse(bpm): 76 Weight(lbs): 160 Blood Pressure(mmHg): 116/70 Body Mass Index(BMI): 31.2 Temperature(F): 97.6 Respiratory Rate(breaths/min): 16 [1:Photos:] [N/A:N/A] Right, Lateral Lower Leg N/A N/A Wound Location: Skin T ear/Laceration N/A N/A Wounding Event: Arterial Insufficiency Ulcer N/A N/A Primary Etiology: Diabetic Wound/Ulcer of the Lower N/A N/A Secondary Etiology: Extremity Sotomayor, Efrata O ([id number]) [id number] Page 5 of 9 Arrhythmia, Coronary Artery Disease, N/A N/A Comorbid History: Hypertension, Type II Diabetes 01/14/2022 N/A N/A  Date Acquired: 2 N/A N/A Weeks of Treatment: Open N/A N/A Wound Status: No N/A N/A Wound Recurrence: 2.7x2.1x0.1 N/A N/A Measurements L x W x D (cm) 4.453 N/A N/A A (cm) : rea 0.445 N/A N/A Volume (cm) : 5.50% N/A N/A % Reduction in Area: 5.50% N/A N/A % Reduction in Volume: Full Thickness Without Exposed N/A N/A Classification: Support Structures Medium N/A N/A Exudate Amount: Serous N/A N/A Exudate Type: amber N/A N/A Exudate Color: Distinct, outline attached N/A N/A Wound Margin: Small (1-33%) N/A N/A Granulation Amount: Red N/A N/A Granulation Quality: Fat Layer (Subcutaneous Tissue): Yes N/A N/A Exposed Structures: Fascia: No Tendon: No Muscle: No Joint: No Bone: No Small (1-33%) N/A N/A Epithelialization: Treatment Notes Electronic Signature(s) Signed: 02/11/2022 1:24:40 PM By: Rosalio Loud MSN RN CNS WTA Entered By: Rosalio Loud on 02/11/2022 09:37:40 -------------------------------------------------------------------------------- Multi-Disciplinary Care Plan Details Patient Name: Date of Service: [name], Monna O. 02/11/2022 9:15 A M Medical Record Number: [id number] Patient Account Number: [id number] Date of Birth/Sex: Treating RN: August 29, 1940 (81 y.o. Drema Pry Primary Care Kylii Ennis: Tracie Harrier Other  Clinician: Referring Ashaad Gaertner: Treating Asianna Brundage/Extender: Solon Palm Weeks in Treatment: 2 Active Inactive Electronic Signature(s) Signed: 02/23/2022 2:53:19 PM By: Gretta Cool, BSN, RN, CWS, Kim RN, BSN Signed: 02/24/2022 4:41:57 PM By: Rosalio Loud MSN RN CNS WTA Previous Signature: 02/11/2022 9:53:46 AM Version By: Rosalio Loud MSN RN CNS WTA Entered By: Gretta Cool, BSN, RN, CWS, Kim on 02/23/2022 14:53:18 Kregel, Johnette Abraham ([id number]) [id number] Page 6 of 9 -------------------------------------------------------------------------------- Pain Assessment Details Patient Name: Date of Service: [name], PAISLI SILFIES. 02/11/2022 9:15 A M Medical Record Number: [id number] Patient Account Number: [id number] Date of Birth/Sex: Treating RN: [birth year] (81 y.o. Drema Pry Primary Care Vondra Aldredge: Tracie Harrier Other Clinician: Referring Mersadie Kavanaugh: Treating Rojean Ige/Extender: Solon Palm Weeks in Treatment: 2 Active Problems Location of Pain Severity and Description of Pain Patient Has Paino No Site Locations Pain Management and Medication Current Pain Management: Electronic Signature(s) Signed: 02/11/2022 9:53:28 AM By: Rosalio Loud MSN RN CNS WTA Entered By: Rosalio Loud on 02/11/2022 09:53:27 -------------------------------------------------------------------------------- Patient/Caregiver Education Details Patient Name: Date of Service: [name], Kellyanne O. 11/17/2023andnbsp9:15 [place] Record Number: [id number] Patient Account Number: [id number] Date of Birth/Gender: Treating RN: 09-01-1940 (81 y.o. Drema Pry Primary Care Physician: Tracie Harrier Other Clinician: Referring Physician: Treating Physician/Extender: Nadara Mustard, Vishwanath Weeks in Treatment: 2 RASHAWNDA, GABA ([id number]) [id number] Page 7 of 9 Education Assessment Education Provided To: Patient and Caregiver Education  Topics Provided Wound/Skin Impairment: Handouts: Caring for Your Ulcer Methods: Explain/Verbal Responses: State content correctly Electronic Signature(s) Signed: 02/11/2022 1:24:40 PM By: Rosalio Loud MSN RN CNS WTA Entered By: Rosalio Loud on 02/11/2022 09:51:54 -------------------------------------------------------------------------------- Wound Assessment Details Patient Name: Date of Service: [name], [place] O. 02/11/2022 9:15 A M Medical Record Number: [id number] Patient Account Number: [id number] Date of Birth/Sex: Treating RN: 04-03-1940 (81 y.o. 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 Wounding Event: Skin Tear/Laceration Etiology: Date Acquired: 01/14/2022 Wound Status: Healed - Epithelialized Weeks Of Treatment: 2 Comorbid Arrhythmia, Coronary Artery Disease, Hypertension, Type Clustered Wound: No History: II Diabetes Photos Wound Measurements Length: (cm) Width: (cm) Depth: (cm) Area: (cm) Volume: (cm) 0 % Reduction in Area: 100% 0 % Reduction in Volume: 100% 0 Epithelialization:  Small (1-33%) 0 Tunneling: No 0 Undermining: No Wound Description Classification: Full Thickness Without Exposed Support Wound Margin: Distinct, outline attached Exudate Amount: Medium Smullen, Theora O ([id number]) Exudate Type: Serous Exudate Color: amber Structures Foul Odor After Cleansing: No [id number] Page 8 of 9 Wound Bed Granulation Amount: Small (1-33%) Exposed Structure Granulation Quality: Red Fascia Exposed: No Fat Layer (Subcutaneous Tissue) Exposed: Yes Tendon Exposed: No Muscle Exposed: No Joint Exposed: No Bone Exposed: No Treatment  Notes Wound #1 (Lower Leg) Wound Laterality: Right, Lateral Cleanser [place] Ancillary Kit - 15 Day Supply Discharge Instruction: Use supplies as instructed; Kit contains: (15) Saline Bullets; (15) 3x3 Gauze; 15 pr Gloves Soap and Water Discharge Instruction: Gently cleanse wound with antibacterial soap, rinse and pat dry prior to dressing wounds Peri-Wound Care Topical Primary Dressing Xeroform 4x4-HBD (in/in) Discharge Instruction: Apply Xeroform 4x4-HBD (in/in) as directed Secondary Dressing ABD Pad 5x9 (in/in) Discharge Instruction: Cover with ABD pad Secured With Kerlix Roll Sterile or Non-Sterile 6-ply 4.5x4 (yd/yd) Discharge Instruction: Apply Kerlix as directed Kerlix Roll Sterile or Non-Sterile 6-ply 4.5x4 (yd/yd) Discharge Instruction: Apply Kerlix as directed Stretch Net, Size 4, 10 (yds) Compression Wrap Compression Stockings Add-Ons Electronic Signature(s) Signed: 02/11/2022 1:24:40 PM By: Rosalio Loud MSN RN CNS WTA Entered By: Rosalio Loud on 02/11/2022 09:59:29 -------------------------------------------------------------------------------- Vitals Details Patient Name: Date of Service: [name], October O. 02/11/2022 9:15 A M Medical Record Number: [id number] Patient Account Number: [id number] Date of Birth/Sex: Treating RN: [birth year] (81 y.o. Drema Pry Primary Care Sundi Slevin: Tracie Harrier Other Clinician: Referring Chang Tiggs: Treating Ranelle Auker/Extender: Solon Palm Weeks in Treatment: 2 Vital Signs Bowie, Flornce O ([id number]) [id number] Page 9 of 9 Time Taken: 09:29 Temperature (F): 97.6 Height (in): 60 Pulse (bpm): 76 Weight (lbs): 160 Respiratory Rate (breaths/min): 16 Body Mass Index (BMI): 31.2 Blood Pressure (mmHg): 116/70 Reference Range: 80 - 120 mg / dl Electronic Signature(s) Signed: 02/11/2022 9:53:21 AM By: Rosalio Loud MSN RN CNS WTA Entered By: Rosalio Loud on 02/11/2022 09:53:21
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
## 2022-06-03 ENCOUNTER — Encounter: Payer: Self-pay | Admitting: Adult Health

## 2022-06-03 ENCOUNTER — Non-Acute Institutional Stay (SKILLED_NURSING_FACILITY): Payer: Medicare Other | Admitting: Adult Health

## 2022-06-03 DIAGNOSIS — K7682 Hepatic encephalopathy: Secondary | ICD-10-CM | POA: Diagnosis not present

## 2022-06-03 DIAGNOSIS — R5381 Other malaise: Secondary | ICD-10-CM

## 2022-06-03 DIAGNOSIS — N390 Urinary tract infection, site not specified: Secondary | ICD-10-CM

## 2022-06-03 DIAGNOSIS — N1832 Chronic kidney disease, stage 3b: Secondary | ICD-10-CM

## 2022-06-03 DIAGNOSIS — K7460 Unspecified cirrhosis of liver: Secondary | ICD-10-CM

## 2022-06-03 DIAGNOSIS — E1142 Type 2 diabetes mellitus with diabetic polyneuropathy: Secondary | ICD-10-CM

## 2022-06-03 NOTE — Progress Notes (Unsigned)
Provider:  Durenda Age Location:  Other [place].  Nursing Home Room Number: [place] of Service:  SNF (24)  PCP: Tracie Harrier, MD Patient Care Team: Tracie Harrier, MD as PCP - General (Internal Medicine)  Extended Emergency Contact Information Primary Emergency Contact: Vizzini,Richard P Address: [address] [place], [place] [address] [place] Phone: [phone] Relation: Spouse Secondary Emergency Contact: Lucas,Kristy [place] Phone: [phone] Relation: Daughter  Code Status: Full Code Goals of Care: Advanced Directive information    06/03/2022   10:14 AM  Advanced Directives  Does Patient Have a Medical Advance Directive? No  Would patient like information on creating a medical advance directive? No - Patient declined      Chief Complaint  Patient presents with   New Admit To SNF    New Admission.     HPI: Patient is a 82 y.o. 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   Sleep apnea    SOB (shortness of breath)    Stroke ([place])    Temporal arteritis (HCC)    Thrombocytopenia (HCC)    Thrombocytopenia ([place])    Past Surgical History:  Procedure Laterality Date   COLONOSCOPY  10/24/2013, 2002   hyperplastic polpys   CORONARY ANGIOPLASTY     CORONARY ARTERY BYPASS GRAFT  09/1998   ESOPHAGOGASTRODUODENOSCOPY (EGD) WITH  PROPOFOL N/A 08/26/2016   Procedure: ESOPHAGOGASTRODUODENOSCOPY (EGD) WITH PROPOFOL;  Surgeon: Manya Silvas, MD;  Location: [facility] ENDOSCOPY;  Service: Endoscopy;  Laterality: N/A;   ESOPHAGOGASTRODUODENOSCOPY (EGD) WITH PROPOFOL N/A 02/05/2018   Procedure: ESOPHAGOGASTRODUODENOSCOPY (EGD) WITH PROPOFOL;  Surgeon: Manya Silvas, MD;  Location: [facility] ENDOSCOPY;  Service: Endoscopy;  Laterality: N/A;   ESOPHAGOGASTRODUODENOSCOPY (EGD) WITH PROPOFOL N/A 07/22/2019   Procedure: ESOPHAGOGASTRODUODENOSCOPY (EGD) WITH PROPOFOL;  Surgeon: Toledo, Benay Pike, MD;  Location: [facility] ENDOSCOPY;  Service: Gastroenterology;  Laterality: N/A;   ESOPHAGOGASTRODUODENOSCOPY ENDOSCOPY  05/24/2011   hemorroid surgery     JOINT REPLACEMENT  2003  2005   bil.knees    reports that she has never smoked. She has never used smokeless tobacco. She reports that she does not drink alcohol and does not use drugs. Social History   Socioeconomic History   Marital status: Married    Spouse name: [employer]   Number of children: Not on file   Years of education: Not on file   Highest education level: Not on file  Occupational History   Not on file  Tobacco Use   Smoking status: Never   Smokeless tobacco: Never  Vaping Use   Vaping Use: Never used  Substance and Sexual Activity   Alcohol use: No    Alcohol/week: 0.0 standard drinks of alcohol  Drug use: No   Sexual activity: Not on file  Other Topics Concern   Not on file  Social History Narrative   Not on file   Social Determinants of Health   Financial Resource Strain: Not on file  Food Insecurity: Not on file  Transportation Needs: Not on file  Physical Activity: Not on file  Stress: Not on file  Social Connections: Not on file  Intimate Partner Violence: Not on file    Functional Status Survey:    Family History  Problem Relation Age of Onset   Heart disease Other    Hypertension Other    Anemia Other    Colon cancer Other    CVA Mother     Dementia Mother    CAD Father    Breast cancer Neg Hx     Health Maintenance  Topic Date Due   Medicare Annual Wellness (AWV)  Never done   FOOT EXAM  Never done   OPHTHALMOLOGY EXAM  Never done   Diabetic kidney evaluation - Urine ACR  Never done   DTaP/Tdap/Td (1 - Tdap) Never done   DEXA SCAN  Never done   Zoster Vaccines- Shingrix (2 of 2) 08/07/2017   HEMOGLOBIN A1C  05/04/2020   Diabetic kidney evaluation - eGFR measurement  11/21/2021   COVID-19 Vaccine (7 - 2023-24 season) 11/26/2021   Pneumonia Vaccine 41+ Years old  Completed   HPV VACCINES  Aged Out    Allergies  Allergen Reactions   Tape Rash   Morphine And Related Nausea And Vomiting    Other reaction(s): Nausea And Vomiting   Other Rash    NEOPRENE, TAPE, BAND-AID TOUGH STRIPS    Outpatient Encounter Medications as of 06/03/2022  Medication Sig   alendronate (FOSAMAX) 70 MG tablet Take 70 mg by mouth every Monday. Take with a full glass of water on an empty stomach.   blood glucose meter kit and supplies KIT Dispense based on patient and insurance preference. Use up to four times daily as directed. (FOR ICD-9 250.00, 250.01).   Calcium Carbonate-Vitamin D 600-200 MG-UNIT TABS Take 1 tablet by mouth daily.   dapagliflozin propanediol (FARXIGA) 10 MG TABS tablet Take 10 mg by mouth daily.   diclofenac Sodium (VOLTAREN) 1 % GEL Apply 2 g topically every 4 (four) hours as needed.   ferrous sulfate 325 (65 FE) MG tablet Take 325 mg by mouth daily.    Infant Care Products [facility] EX) Apply to buttocks/peri area topically every shift.   insulin glargine (LANTUS) 100 UNIT/ML injection Inject 7 Units into the skin at bedtime.   insulin lispro (HUMALOG) 100 UNIT/ML injection Inject 6 Units into the skin 3 (three) times daily with meals.   lactulose (CHRONULAC) 10 GM/15ML solution Take 60 mLs by mouth 3 (three) times daily.   levothyroxine (SYNTHROID, LEVOTHROID) 50 MCG tablet Take 50 mcg by mouth daily before  breakfast.    Melatonin 10 MG CAPS Take 10 mg by mouth at bedtime.    rifaximin (XIFAXAN) 550 MG TABS tablet Take 550 mg by mouth 2 (two) times daily.    simvastatin (ZOCOR) 20 MG tablet Take 20 mg by mouth at bedtime.    spironolactone (ALDACTONE) 50 MG tablet Take 50 mg by mouth daily.   torsemide (DEMADEX) 20 MG tablet Take 20 mg by mouth daily.   Zinc 50 MG TABS Give one tablet by mouth in the evening   [DISCONTINUED] CINNAMON PO Take by mouth.   [DISCONTINUED] Cranberry-Milk Thistle 250-75  MG CAPS Take 1 capsule by mouth 3 (three) times daily.    [DISCONTINUED] gabapentin (NEURONTIN) 100 MG capsule Take 200 mg by mouth at bedtime.    [DISCONTINUED] Menthol-Camphor (ICY HOT ADVANCED PAIN RELIEF) 16-11 % CREA Apply 1 application topically as directed.   [DISCONTINUED] metoprolol succinate (TOPROL-XL) 25 MG 24 hr tablet Take 12.5 mg by mouth daily.   [DISCONTINUED] omeprazole (PRILOSEC) 20 MG capsule Take 20 mg by mouth daily.    [DISCONTINUED] polyethylene glycol (MIRALAX / GLYCOLAX) 17 g packet Take 17 g by mouth 2 (two) times daily.   [DISCONTINUED] potassium chloride SA (KLOR-CON) 20 MEQ tablet Take 1 tablet (20 mEq total) by mouth daily.   [DISCONTINUED] torsemide (DEMADEX) 20 MG tablet Take 2 tablets (40 mg total) by mouth daily. (Patient taking differently: Take 20 mg by mouth daily.)   No facility-administered encounter medications on file as of 06/03/2022.    Review of Systems  Vitals:   06/03/22 0958  BP: (!) 96/51  Pulse: 65  Resp: 18  Temp: (!) 97.3 F (36.3 C)  SpO2: 94%  Weight: 156 lb 3.2 oz (70.9 kg)  Height: 5' (1.524 m)   Body mass index is 30.51 kg/m. Physical Exam  Labs reviewed: Basic Metabolic Panel: No results for input(s): "NA", "K", "CL", "CO2", "GLUCOSE", "BUN", "CREATININE", "CALCIUM", "MG", "PHOS" in the last 8760 hours. Liver Function Tests: No results for input(s): "AST", "ALT", "ALKPHOS", "BILITOT", "PROT", "ALBUMIN" in the last 8760 hours. No  results for input(s): "LIPASE", "AMYLASE" in the last 8760 hours. No results for input(s): "AMMONIA" in the last 8760 hours. CBC: No results for input(s): "WBC", "NEUTROABS", "HGB", "HCT", "MCV", "PLT" in the last 8760 hours. Cardiac Enzymes: No results for input(s): "CKTOTAL", "CKMB", "CKMBINDEX", "TROPONINI" in the last 8760 hours. BNP: Invalid input(s): "POCBNP" Lab Results  Component Value Date   HGBA1C 5.7 (H) 11/02/2019   Lab Results  Component Value Date   TSH 2.319 11/20/2020   Lab Results  Component Value Date   [id number] (H) 11/20/2020   No results found for: "FOLATE" Lab Results  Component Value Date   IRON 154 01/16/2017   TIBC 273 01/16/2017   FERRITIN 66 01/16/2017    Imaging and Procedures obtained prior to SNF admission: US Abdomen Limited RUQ (LIVER/GB)  Result Date: 05/11/2021 CLINICAL DATA:  Cirrhosis EXAM: ULTRASOUND ABDOMEN LIMITED RIGHT UPPER QUADRANT COMPARISON:  05/14/2020 FINDINGS: Gallbladder: Multiple layering gallstones are seen within the gallbladder. The gallbladder, however, is not distended, there is no gallbladder wall thickening, and no pericholecystic fluid is identified. The sonographic Percell Miller sign is reportedly negative. Common bile duct: Diameter: 3 mm in proximal diameter Liver: The liver is diffusely nodular and the hepatic echotexture is diffusely coarsened in keeping with changes of cirrhosis. A 12 mm simple subserosal cyst is developed within the right hepatic lobe. No focal solid intrahepatic masses are identified, however. No intrahepatic biliary ductal dilation. Portal vein is patent on color Doppler imaging with normal direction of blood flow towards the liver. Other: Mild ascites is present, increased when compared to prior examination, but similar to that seen on prior CT examination of 08/01/2019. IMPRESSION: Cholelithiasis without sonographic evidence of acute cholecystitis. Parenchymal changes in keeping with cirrhosis. No focal  intrahepatic mass. Mild ascites. Electronically Signed   By: Fidela Salisbury M.D.   On: 05/11/2021 17:28    Assessment/Plan There are no diagnoses linked to this encounter.   Family/ staff Communication:   Labs/tests ordered:

## 2022-06-06 LAB — CBC AND DIFFERENTIAL
HCT: 30 — AB (ref 36–46)
Hemoglobin: 10.2 — AB (ref 12.0–16.0)
Neutrophils Absolute: 3049
Platelets: 49 10*3/uL — AB (ref 150–400)
WBC: 4.2

## 2022-06-06 LAB — HEPATIC FUNCTION PANEL
ALT: 60 U/L — AB (ref 7–35)
AST: 52 — AB (ref 13–35)
Alkaline Phosphatase: 122 (ref 25–125)
Bilirubin, Total: 3.3

## 2022-06-06 LAB — BASIC METABOLIC PANEL
BUN: 49 — AB (ref 4–21)
CO2: 28 — AB (ref 13–22)
Chloride: 92 — AB (ref 99–108)
Creatinine: 1.7 — AB (ref 0.5–1.1)
Glucose: 305
Potassium: 3.7 mEq/L (ref 3.5–5.1)
Sodium: 131 — AB (ref 137–147)

## 2022-06-06 LAB — COMPREHENSIVE METABOLIC PANEL
Albumin: 3.4 — AB (ref 3.5–5.0)
Calcium: 9.7 (ref 8.7–10.7)
Globulin: 4.2
eGFR: 29

## 2022-06-06 LAB — HEMOGLOBIN A1C: Hemoglobin A1C: 6.3

## 2022-06-06 LAB — CBC: RBC: 3.34 — AB (ref 3.87–5.11)

## 2022-06-08 ENCOUNTER — Non-Acute Institutional Stay (SKILLED_NURSING_FACILITY): Payer: Medicare Other | Admitting: Student

## 2022-06-08 ENCOUNTER — Encounter: Payer: Self-pay | Admitting: Student

## 2022-06-08 DIAGNOSIS — I8510 Secondary esophageal varices without bleeding: Secondary | ICD-10-CM

## 2022-06-08 DIAGNOSIS — K7469 Other cirrhosis of liver: Secondary | ICD-10-CM

## 2022-06-08 DIAGNOSIS — E1142 Type 2 diabetes mellitus with diabetic polyneuropathy: Secondary | ICD-10-CM

## 2022-06-08 DIAGNOSIS — I10 Essential (primary) hypertension: Secondary | ICD-10-CM

## 2022-06-08 DIAGNOSIS — N1832 Chronic kidney disease, stage 3b: Secondary | ICD-10-CM

## 2022-06-08 DIAGNOSIS — N390 Urinary tract infection, site not specified: Secondary | ICD-10-CM

## 2022-06-08 DIAGNOSIS — N179 Acute kidney failure, unspecified: Secondary | ICD-10-CM

## 2022-06-08 DIAGNOSIS — I480 Paroxysmal atrial fibrillation: Secondary | ICD-10-CM

## 2022-06-08 DIAGNOSIS — I5033 Acute on chronic diastolic (congestive) heart failure: Secondary | ICD-10-CM

## 2022-06-08 DIAGNOSIS — K7682 Hepatic encephalopathy: Secondary | ICD-10-CM
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
## 2022-06-08 NOTE — Progress Notes (Incomplete)
Provider:  Durenda Age Location:  Other [place].  Nursing Home Room Number: [place] of Service:  SNF (60)  PCP: Tracie Harrier, MD Patient Care Team: Tracie Harrier, MD as PCP - General (Internal Medicine)  Extended Emergency Contact Information Primary Emergency Contact: Muldrew,Richard P Address: [address] [place], [place] [address] [place] Phone: [phone] Relation: Spouse Secondary Emergency Contact: Lucas,Kristy [place] Phone: [phone] Relation: Daughter  Code Status: Full Code Goals of Care: Advanced Directive information    06/03/2022   10:14 AM  Advanced Directives  Does Patient Have a Medical Advance Directive? No  Would patient like information on creating a medical advance directive? No - Patient declined      Chief Complaint  Patient presents with  . New Admit To SNF    New Admission.     HPI: Patient is a 82 y.o. female seen today for admission to [facility] on 06/02/22 post [facility] admission 05/27/22 to 06/02/22 for hepatic encephalopathy due to UTI. She was treated with Ceftriaxone X 3 days. Diagnostic paracentesis was done but did not have enough fluid to tap. Lactulose was increased and was discharged on Lactulose 60 ml TID with goal BMs 3-4/day. Home Rifamixin was continued. Lasix and Spironolactone initially held due to AKI but restarted prior to discharge after AKI resolution.   She was seen today while having PT. She was seen walking with gait belt held by PT and wheelchair behind her.  Past Medical History:  Diagnosis Date  . Anemia   . Arthritis   . Asthma   . Bradycardia   . CAD (coronary artery disease)    BIL.CAROTID ARTERY STENOSIS  . CHF (congestive heart failure) ([place])   . Chronic low back pain   . Cirrhosis of liver not due to alcohol ([place])   . Colon polyps   . Diabetes mellitus without complication ([place])   . Dyspnea   . Esophageal  varices without bleeding ([place])   . GERD (gastroesophageal reflux disease)   . Hip pain   . Hyperlipidemia   . Hypertension   . IDA (iron deficiency anemia)   . 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 Sleep apnea   . SOB (shortness of breath)   . Stroke ([place])   . Temporal arteritis ([place])   . Thrombocytopenia ([place])   . Thrombocytopenia ([place])    Past Surgical History:  Procedure Laterality Date  . COLONOSCOPY  10/24/2013, 2002   hyperplastic polpys  . CORONARY ANGIOPLASTY    . CORONARY ARTERY BYPASS GRAFT  09/1998  . ESOPHAGOGASTRODUODENOSCOPY (EGD) WITH PROPOFOL N/A 08/26/2016   Procedure: ESOPHAGOGASTRODUODENOSCOPY (EGD) WITH PROPOFOL;  Surgeon: Manya Silvas, MD;  Location: [facility] ENDOSCOPY;  Service: Endoscopy;  Laterality: N/A;  . ESOPHAGOGASTRODUODENOSCOPY (EGD) WITH PROPOFOL N/A 02/05/2018   Procedure: ESOPHAGOGASTRODUODENOSCOPY (EGD) WITH PROPOFOL;  Surgeon: Manya Silvas, MD;  Location: [facility] ENDOSCOPY;  Service: Endoscopy;  Laterality: N/A;  . ESOPHAGOGASTRODUODENOSCOPY (EGD) WITH PROPOFOL N/A 07/22/2019   Procedure: ESOPHAGOGASTRODUODENOSCOPY (EGD) WITH PROPOFOL;  Surgeon: Toledo, Benay Pike, MD;  Location: [facility] ENDOSCOPY;  Service: Gastroenterology;  Laterality: N/A;  . ESOPHAGOGASTRODUODENOSCOPY ENDOSCOPY  05/24/2011  . hemorroid surgery    . JOINT REPLACEMENT  2003  2005   bil.knees    reports that she has never smoked. She has never used smokeless tobacco. She reports that she does not drink alcohol and does  not use drugs. Social History   Socioeconomic History  . Marital status: Married    Spouse name: [employer]  . Number of children: Not on file  . Years of education: Not on file  . Highest education level: Not on file  Occupational History  . Not on file  Tobacco Use  . Smoking status: Never  . Smokeless tobacco: Never  Vaping Use  . Vaping Use: Never used  Substance and Sexual Activity  . Alcohol use: No    Alcohol/week: 0.0  standard drinks of alcohol  . Drug use: No  . Sexual activity: Not on file  Other Topics Concern  . Not on file  Social History Narrative  . Not on file   Social Determinants of Health   Financial Resource Strain: Not on file  Food Insecurity: Not on file  Transportation Needs: Not on file  Physical Activity: Not on file  Stress: Not on file  Social Connections: Not on file  Intimate Partner Violence: Not on file    Functional Status Survey:    Family History  Problem Relation Age of Onset  . Heart disease Other   . Hypertension Other   . Anemia Other   . Colon cancer Other   . CVA Mother   . Dementia Mother   . CAD Father   . Breast cancer Neg Hx     Health Maintenance  Topic Date Due  . Medicare Annual Wellness (AWV)  Never done  . FOOT EXAM  Never done  . OPHTHALMOLOGY EXAM  Never done  . Diabetic kidney evaluation - Urine ACR  Never done  . DTaP/Tdap/Td (1 - Tdap) Never done  . DEXA SCAN  Never done  . Zoster Vaccines- Shingrix (2 of 2) 08/07/2017  . HEMOGLOBIN A1C  05/04/2020  . Diabetic kidney evaluation - eGFR measurement  11/21/2021  . COVID-19 Vaccine (7 - 2023-24 season) 11/26/2021  . Pneumonia Vaccine 77+ Years old  Completed  . HPV VACCINES  Aged Out    Allergies  Allergen Reactions  . Tape Rash  . Morphine And Related Nausea And Vomiting    Other reaction(s): Nausea And Vomiting  . Other Rash    NEOPRENE, TAPE, BAND-AID TOUGH STRIPS    Outpatient Encounter Medications as of 06/03/2022  Medication Sig  . alendronate (FOSAMAX) 70 MG tablet Take 70 mg by mouth every Monday. Take with a full glass of water on an empty stomach.  . blood glucose meter kit and supplies KIT Dispense based on patient and insurance preference. Use up to four times daily as directed. (FOR ICD-9 250.00, 250.01).  . Calcium Carbonate-Vitamin D 600-200 MG-UNIT TABS Take 1 tablet by mouth daily.  . dapagliflozin propanediol (FARXIGA) 10 MG TABS tablet Take 10 mg by mouth  daily.  . diclofenac Sodium (VOLTAREN) 1 % GEL Apply 2 g topically every 4 (four) hours as needed.  . ferrous sulfate 325 (65 FE) MG tablet Take 325 mg by mouth daily.   . Infant Care Products [facility] EX) Apply to buttocks/peri area topically every shift.  . insulin glargine (LANTUS) 100 UNIT/ML injection Inject 7 Units into the skin at bedtime.  . insulin lispro (HUMALOG) 100 UNIT/ML injection Inject 6 Units into the skin 3 (three) times daily with meals.  . lactulose (CHRONULAC) 10 GM/15ML solution Take 60 mLs by mouth 3 (three) times daily.  Marland Kitchen levothyroxine (SYNTHROID, LEVOTHROID) 50 MCG tablet Take 50 mcg by mouth daily before breakfast.   . Melatonin 10 MG  CAPS Take 10 mg by mouth at bedtime.   . rifaximin (XIFAXAN) 550 MG TABS tablet Take 550 mg by mouth 2 (two) times daily.   . simvastatin (ZOCOR) 20 MG tablet Take 20 mg by mouth at bedtime.   Marland Kitchen spironolactone (ALDACTONE) 50 MG tablet Take 50 mg by mouth daily.  Marland Kitchen torsemide (DEMADEX) 20 MG tablet Take 20 mg by mouth daily.  . Zinc 50 MG TABS Give one tablet by mouth in the evening  . [DISCONTINUED] CINNAMON PO Take by mouth.  . [DISCONTINUED] Cranberry-Milk Thistle 250-75 MG CAPS Take 1 capsule by mouth 3 (three) times daily.   . [DISCONTINUED] gabapentin (NEURONTIN) 100 MG capsule Take 200 mg by mouth at bedtime.   . [DISCONTINUED] Menthol-Camphor (ICY HOT ADVANCED PAIN RELIEF) 16-11 % CREA Apply 1 application topically as directed.  . [DISCONTINUED] metoprolol succinate (TOPROL-XL) 25 MG 24 hr tablet Take 12.5 mg by mouth daily.  . [DISCONTINUED] omeprazole (PRILOSEC) 20 MG capsule Take 20 mg by mouth daily.   . [DISCONTINUED] polyethylene glycol (MIRALAX / GLYCOLAX) 17 g packet Take 17 g by mouth 2 (two) times daily.  . [DISCONTINUED] potassium chloride SA (KLOR-CON) 20 MEQ tablet Take 1 tablet (20 mEq total) by mouth daily.  . [DISCONTINUED] torsemide (DEMADEX) 20 MG tablet Take 2 tablets (40 mg total) by mouth daily. (Patient  taking differently: Take 20 mg by mouth daily.)   No facility-administered encounter medications on file as of 06/03/2022.    Review of Systems  Constitutional:  Negative for appetite change, chills, fatigue and fever.  HENT:  Negative for congestion, hearing loss, rhinorrhea and sore throat.   Eyes: Negative.   Respiratory:  Negative for cough, shortness of breath and wheezing.   Cardiovascular:  Negative for chest pain, palpitations and leg swelling.  Gastrointestinal:  Negative for abdominal pain, constipation, diarrhea, nausea and vomiting.  Genitourinary:  Negative for dysuria.  Musculoskeletal:  Negative for arthralgias, back pain and myalgias.  Skin:  Negative for color change, rash and wound.  Neurological:  Negative for dizziness, weakness and headaches.  Psychiatric/Behavioral:  Negative for behavioral problems. The patient is not nervous/anxious.     Vitals:   06/03/22 0958  BP: (!) 96/51  Pulse: 65  Resp: 18  Temp: (!) 97.3 F (36.3 C)  SpO2: 94%  Weight: 156 lb 3.2 oz (70.9 kg)  Height: 5' (1.524 m)   Body mass index is 30.51 kg/m. Physical Exam Constitutional:      General: She is not in acute distress.    Appearance: She is obese.  HENT:     Head: Normocephalic and atraumatic.     Nose: Nose normal.     Mouth/Throat:     Mouth: Mucous membranes are moist.  Eyes:     Conjunctiva/sclera: Conjunctivae normal.  Cardiovascular:     Rate and Rhythm: Normal rate and regular rhythm.  Pulmonary:     Effort: Pulmonary effort is normal.     Breath sounds: Normal breath sounds.  Abdominal:     General: Bowel sounds are normal.     Palpations: Abdomen is soft.  Musculoskeletal:        General: Normal range of motion.     Cervical back: Normal range of motion.  Skin:    General: Skin is warm and dry.  Neurological:     General: No focal deficit present.     Mental Status: She is alert and oriented to person, place, and time.  Psychiatric:  Mood and  Affect: Mood normal.        Behavior: Behavior normal.        Thought Content: Thought content normal.        Judgment: Judgment normal.     Labs reviewed: Basic Metabolic Panel: No results for input(s): "NA", "K", "CL", "CO2", "GLUCOSE", "BUN", "CREATININE", "CALCIUM", "MG", "PHOS" in the last 8760 hours. Liver Function Tests: No results for input(s): "AST", "ALT", "ALKPHOS", "BILITOT", "PROT", "ALBUMIN" in the last 8760 hours. No results for input(s): "LIPASE", "AMYLASE" in the last 8760 hours. No results for input(s): "AMMONIA" in the last 8760 hours. CBC: No results for input(s): "WBC", "NEUTROABS", "HGB", "HCT", "MCV", "PLT" in the last 8760 hours. Cardiac Enzymes: No results for input(s): "CKTOTAL", "CKMB", "CKMBINDEX", "TROPONINI" in the last 8760 hours. BNP: Invalid input(s): "POCBNP" Lab Results  Component Value Date   HGBA1C 5.7 (H) 11/02/2019   Lab Results  Component Value Date   TSH 2.319 11/20/2020   Lab Results  Component Value Date   [id number] (H) 11/20/2020   No results found for: "FOLATE" Lab Results  Component Value Date   IRON 154 01/16/2017   TIBC 273 01/16/2017   FERRITIN 66 01/16/2017    Imaging and Procedures obtained prior to SNF admission: US Abdomen Limited RUQ (LIVER/GB)  Result Date: 05/11/2021 CLINICAL DATA:  Cirrhosis EXAM: ULTRASOUND ABDOMEN LIMITED RIGHT UPPER QUADRANT COMPARISON:  05/14/2020 FINDINGS: Gallbladder: Multiple layering gallstones are seen within the gallbladder. The gallbladder, however, is not distended, there is no gallbladder wall thickening, and no pericholecystic fluid is identified. The sonographic Percell Miller sign is reportedly negative. Common bile duct: Diameter: 3 mm in proximal diameter Liver: The liver is diffusely nodular and the hepatic echotexture is diffusely coarsened in keeping with changes of cirrhosis. A 12 mm simple subserosal cyst is developed within the right hepatic lobe. No focal solid intrahepatic  masses are identified, however. No intrahepatic biliary ductal dilation. Portal vein is patent on color Doppler imaging with normal direction of blood flow towards the liver. Other: Mild ascites is present, increased when compared to prior examination, but similar to that seen on prior CT examination of 08/01/2019. IMPRESSION: Cholelithiasis without sonographic evidence of acute cholecystitis. Parenchymal changes in keeping with cirrhosis. No focal intrahepatic mass. Mild ascites. Electronically Signed   By: Fidela Salisbury M.D.   On: 05/11/2021 17:28    Assessment/Plan  1. Hepatic encephalopathy (HCC) -  secondary to UTI -  treated with Rocephin X 3 days  2. Hepatic cirrhosis, unspecified hepatic cirrhosis type, unspecified whether ascites present (HCC) -  Lactulose was increased and was discharged on Lactulose 60 ml TID with goal Bms 3-4/day. Home Rifamixin was continued  3. Urinary tract infection without hematuria, site unspecified -  treated with Rocephin X 3 days  4. Stage 3b chronic kidney disease (HCC) -  Lasix and Spironolactone initially held due to AKI but restarted prior to discharge after AKI resolution.   5. Type 2 diabetes mellitus with peripheral neuropathy (HCC) -  continue Insulin Lispro and Glargine  6. Physical deconditioning -  continue PT and OT, for therapeutic strengthening exercises -  fall precautions     Family/ staff Communication:   Discussed plan of care with resident and charge nurse.  Labs/tests ordered: CMP, CBC, ammonia

## 2022-06-10 DIAGNOSIS — I25810 Atherosclerosis of coronary artery bypass graft(s) without angina pectoris: Secondary | ICD-10-CM | POA: Insufficient documentation

## 2022-06-13 ENCOUNTER — Non-Acute Institutional Stay: Payer: Medicare Other | Admitting: Hospice

## 2022-06-13 DIAGNOSIS — R531 Weakness: Secondary | ICD-10-CM

## 2022-06-13 DIAGNOSIS — I5032 Chronic diastolic (congestive) heart failure: Secondary | ICD-10-CM

## 2022-06-13 DIAGNOSIS — K7682 Hepatic encephalopathy: Secondary | ICD-10-CM

## 2022-06-13 DIAGNOSIS — Z515 Encounter for palliative care: Secondary | ICD-10-CM

## 2022-06-13 NOTE — Progress Notes (Signed)
[facility] Consult Note Telephone: [phone]  Fax: [phone]  PATIENT NAME: Theresa [name] [address] [place] [address] [phone] (home)  DOB: 29-May-1940 MRN: [id number]  PRIMARY CARE PROVIDER:    Tracie Harrier, MD,  [address] [place] [address] [place] PROVIDER:   Tracie [name], [place],  [place] [address] [phone]  RESPONSIBLE PARTY:   Rick/Spouse Contact Information     Name Relation Home Work Mobile   Wise,Theresa [name] Spouse [phone]     Theresa [name] Daughter [phone]     Theresa [name] Son   [phone]   Amyna, Kowall [phone]  [phone]        I met face to face with patient in the facility. Visit to build trust and highlight Palliative Medicine as specialized medical care for people living with serious illness, aimed at facilitating better quality of life through symptoms relief, assisting with advance care planning and complex medical decision making. NP called Liliane Channel and updated him on visit. He expressed appreciation for the call.   Palliative care team will continue to support patient, patient's family, and medical team.  ASSESSMENT AND / RECOMMENDATIONS:  ------------------------------------------------------------------------------------------------------------------------------------------------- Advance Care Planning: Our advance care planning conversation included a discussion about:    The value and importance of advance care planning  Difference between Hospice and Palliative care Exploration of goals of care in the event of a sudden injury or illness  Identification and preparation of a healthcare agent  Review and updating or creation of an  advance directive document . Decision not to resuscitate or to de-escalate disease focused treatments due to poor prognosis.  CODE  STATUS: Patient is a Full code.  Family can reevaluate in the future, per [facility].  Patient mentioned her son Theresa [name] is a paramedic and had also provided her education on the ramifications and implications of code status.   Goals of Care: Goals include to maximize quality of life and symptom management. Patient wishes to get stronger and go back home.   I spent 20 minutes providing this initial consultation. More than 50% of the time in this consultation was spent on counseling patient and coordinating communication. --------------------------------------------------------------------------------------------------------------------------------------  Symptom Management/Plan: Hepatic Encephalopathy: secondary to UTI for which she was hospitalized 3/1-06/02/22, completed antibiotics.  Mental status improved. Continue Lactulose, Rifaximin for Cirrhosis as ordered. Routine CBC CMP Ammonia level.  CHF: Managed with Spironolactone and Lasix. Monitor for edema, shortness of breath. Encourage elevation of BLE. Monitor weight and report weight gain of 2 Ibs in a day or 5 Ibs in a week.  Weakness: worsened with recent hospitalization. PT/OT for strengthening and mobility. Fall precautions.   Follow up: Palliative care will continue to follow for complex medical decision making, advance care planning, and clarification of goals. Return 6 weeks or prn. Encouraged to call provider sooner with any concerns.   Family /Caregiver/Community Supports: Patient in SNF for acute rehab  HOSPICE ELIGIBILITY/DIAGNOSIS: TBD  Chief Complaint: Initial Palliative care visit  HISTORY OF PRESENT ILLNESS:  Theresa [name] is a 82 y.o. year old female  with multiple morbidities requiring close monitoring/management, and with high risk of complications and  mortality:  Hepatic encephalopathy, Cirrhosis, non alcoholic, Type 2 DM,- current A1c 6.29 May 2022,  CKD 3, weakness. Patient endorsed weakness; reports her mind has cleared up and  she is no longer confused, denies pain/discomfort.  History obtained from review of EMR, discussion  with primary team, caregiver, family and/or Theresa [name].  Review and summarization of Epic records shows history from other than patient. Rest of 10 point ROS asked and negative. Independent interpretation of tests and reviewed as needed, available labs, patient records, imaging, studies and related documents from the EMR.  PHYSICAL EXAM: BP 107/59, P 68 02 95% RA T 97.7F GEN: in no acute distress  Cardiac: S1 S2, trace edema to BLE Respiratory:  clear to auscultation bilaterally GI: soft, nontender, nondistended, + BS, regular bowel movement - three yesterday and one today  MS: Moving all extremities, ambulatory with rolling walker, weakness Skin: warm and dry, no rash to visible skin Neuro: Alert and oriented x 3 Psych: non-anxious affect, cooperative   PAST MEDICAL HISTORY:  Active Ambulatory Problems    Diagnosis Date Noted   Thrombocytopenia ([place]) 10/03/2014   Iron deficiency anemia due to chronic blood loss 10/03/2014   MGUS (monoclonal gammopathy of unknown significance) 10/14/2015   Cirrhosis ([place]) 10/14/2015   Cirrhosis, 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) 06/10/2019   Pleural effusion on right 11/02/2019   Subarachnoid bleed ([place]) 11/19/2020   AKI (acute kidney injury) ([place]) 11/19/2020   CKD (chronic kidney disease), stage III ([place]) 11/19/2020   Fall    Hepatic encephalopathy syndrome ([place]) 11/13/2020   Esophageal varices without bleeding ([place]) 09/09/2013   Edema due to congestive heart failure ([place]) 11/07/2016   CAD (coronary  artery disease) of artery bypass graft 06/10/2022   Atrial flutter, paroxysmal ([place]) 11/17/2020   Resolved Ambulatory Problems    Diagnosis Date Noted   Acute CHF ([place]) 09/25/2019   Acute bronchitis 11/02/2019   Past Medical History:  Diagnosis Date   Anemia    Arthritis    Asthma    Bradycardia    CAD (coronary artery disease)    CHF (congestive heart failure) (HCC)    Chronic low back pain    Cirrhosis of liver not due to alcohol (HCC)    Colon polyps    Diabetes mellitus without complication (HCC)    Dyspnea    GERD (gastroesophageal reflux disease)    Hip pain    Hyperlipidemia    Hypertension    IDA (iron deficiency anemia)    Sleep apnea    SOB (shortness of breath)    Stroke (HCC)    Temporal arteritis (HCC)     SOCIAL HX:  Social History   Tobacco Use   Smoking status: Never   Smokeless tobacco: Never  Substance Use Topics   Alcohol use: No    Alcohol/week: 0.0 standard drinks of alcohol     FAMILY HX:  Family History  Problem Relation Age of Onset   Heart disease Other    Hypertension Other    Anemia Other    Colon cancer Other    CVA Mother    Dementia Mother    CAD Father    Breast cancer Neg Hx       ALLERGIES:  Allergies  Allergen Reactions   Tape Rash   Morphine And Related Nausea And Vomiting    Other reaction(s): Nausea And Vomiting   Other Rash  NEOPRENE, TAPE, BAND-AID TOUGH STRIPS      PERTINENT MEDICATIONS:  Outpatient Encounter Medications as of 06/13/2022  Medication Sig   alendronate (FOSAMAX) 70 MG tablet Take 70 mg by mouth every Monday. Take with a full glass of water on an empty stomach.   blood glucose meter kit and supplies KIT Dispense based on patient and insurance preference. Use up to four times daily as directed. (FOR ICD-9 250.00, 250.01).   Calcium Carbonate-Vitamin D 600-200 MG-UNIT TABS Take 1 tablet by mouth daily.   dapagliflozin propanediol (FARXIGA) 10 MG TABS tablet Take 10 mg by mouth daily.    diclofenac Sodium (VOLTAREN) 1 % GEL Apply 2 g topically every 4 (four) hours as needed.   ferrous sulfate 325 (65 FE) MG tablet Take 325 mg by mouth daily. Monday,Wednesday and Friday.   Infant Care Products [facility] EX) Apply to buttocks/peri area topically every shift.   insulin glargine (LANTUS) 100 UNIT/ML injection Inject 12 Units into the skin at bedtime.   insulin lispro (HUMALOG) 100 UNIT/ML injection Inject 6 Units into the skin 3 (three) times daily with meals.   lactulose (CHRONULAC) 10 GM/15ML solution Take 60 mLs by mouth 3 (three) times daily.   levothyroxine (SYNTHROID, LEVOTHROID) 50 MCG tablet Take 50 mcg by mouth daily before breakfast.    rifaximin (XIFAXAN) 550 MG TABS tablet Take 550 mg by mouth 2 (two) times daily.    simvastatin (ZOCOR) 20 MG tablet Take 20 mg by mouth at bedtime.    spironolactone (ALDACTONE) 50 MG tablet Take 50 mg by mouth daily.   torsemide (DEMADEX) 20 MG tablet Take 20 mg by mouth daily.   Zinc 50 MG TABS Give one tablet by mouth in the evening   No facility-administered encounter medications on file as of 06/13/2022.     Thank you for the opportunity to participate in the care of Ms. Sachdev.  The palliative care team will continue to follow. Please call our office at [phone] if we can be of additional assistance.   Note: Portions of this note were generated with Lobbyist. Dictation errors may occur despite best attempts at proofreading.  Teodoro Spray, NP

## 2022-06-23 ENCOUNTER — Non-Acute Institutional Stay (SKILLED_NURSING_FACILITY): Payer: Medicare Other | Admitting: Nurse Practitioner

## 2022-06-23 ENCOUNTER — Encounter: Payer: Self-pay | Admitting: Nurse Practitioner

## 2022-06-23 DIAGNOSIS — I10 Essential (primary) hypertension: Secondary | ICD-10-CM | POA: Diagnosis not present

## 2022-06-23 DIAGNOSIS — N1832 Chronic kidney disease, stage 3b: Secondary | ICD-10-CM | POA: Diagnosis not present

## 2022-06-23 DIAGNOSIS — K7460 Unspecified cirrhosis of liver: Secondary | ICD-10-CM

## 2022-06-23 DIAGNOSIS — E1142 Type 2 diabetes mellitus with diabetic polyneuropathy: Secondary | ICD-10-CM

## 2022-06-23 DIAGNOSIS — K7682 Hepatic encephalopathy: Secondary | ICD-10-CM | POA: Diagnosis not present

## 2022-06-23 NOTE — Progress Notes (Signed)
Location:  Other [facility]) Nursing Home Room Number: Theresa [name] of Service:  SNF (31)  Mariellen Blaney K. Dewaine Oats, NP   Patient Care Team: Tracie Harrier, MD as PCP - General (Internal Medicine)  Extended Emergency Contact Information Primary Emergency Contact: Rosenberg,Richard P Address: [place],  [address] [place] Phone: [phone] Relation: Spouse Secondary Emergency Contact: Lucas,Kristy [place] Phone: [phone] Relation: Daughter  Goals of care: Advanced Directive information    06/23/2022    8:30 AM  Advanced Directives  Does Patient Have a Medical Advance Directive? No  Would patient like information on creating a medical advance directive? No - Patient declined     Chief Complaint  Patient presents with   Discharge Note    Discharge from [facility]     HPI:  Pt is a 82 y.o. female seen today for discharge home. 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 She is at [facility] for rehab after hospitalization for hepetic encephalopathy, AKI and UTI.  She has done well in rehab and now stable for discharge home.    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   Sleep apnea    SOB (shortness of breath)    Stroke ([place])    Temporal arteritis (HCC)    Thrombocytopenia (HCC)    Thrombocytopenia ([place])    Past Surgical  History:  Procedure Laterality Date   COLONOSCOPY  10/24/2013, 2002   hyperplastic polpys   CORONARY ANGIOPLASTY     CORONARY ARTERY BYPASS GRAFT  09/1998   ESOPHAGOGASTRODUODENOSCOPY (EGD) WITH PROPOFOL N/A 08/26/2016   Procedure: ESOPHAGOGASTRODUODENOSCOPY (EGD) WITH PROPOFOL;  Surgeon: Manya Silvas, MD;  Location: [facility] ENDOSCOPY;  Service: Endoscopy;  Laterality: N/A;   ESOPHAGOGASTRODUODENOSCOPY (EGD) WITH PROPOFOL N/A 02/05/2018   Procedure: ESOPHAGOGASTRODUODENOSCOPY (EGD) WITH PROPOFOL;  Surgeon: Manya Silvas, MD;  Location: [facility] ENDOSCOPY;  Service: Endoscopy;  Laterality: N/A;   ESOPHAGOGASTRODUODENOSCOPY (EGD) WITH PROPOFOL N/A 07/22/2019   Procedure: ESOPHAGOGASTRODUODENOSCOPY (EGD) WITH PROPOFOL;  Surgeon: Toledo, Benay Pike, MD;  Location: [facility] ENDOSCOPY;  Service: Gastroenterology;  Laterality: N/A;   ESOPHAGOGASTRODUODENOSCOPY ENDOSCOPY  05/24/2011   hemorroid surgery     JOINT REPLACEMENT  2003  2005   bil.knees    Allergies  Allergen Reactions   Tape Rash   Morphine And Related Nausea And Vomiting    Other reaction(s): Nausea And Vomiting   Other Rash    NEOPRENE, TAPE, BAND-AID TOUGH STRIPS    Outpatient Encounter Medications as of 06/23/2022  Medication Sig   alendronate (FOSAMAX) 70 MG tablet Take 70 mg by mouth every Monday. Take with a full glass of water on an empty stomach.   Calcium Carbonate-Vitamin D 600-200 MG-UNIT TABS  Take 1 tablet by mouth daily.   dapagliflozin propanediol (FARXIGA) 10 MG TABS tablet Take 10 mg by mouth daily.   diclofenac Sodium (VOLTAREN) 1 % GEL Apply 2 g topically every 4 (four) hours as needed.   ferrous sulfate 325 (65 FE) MG tablet Take 325 mg by mouth daily. Monday,Wednesday and Friday.   Infant Care Products [facility] EX) Apply to buttocks/peri area topically every shift.   insulin glargine (LANTUS) 100 UNIT/ML injection Inject 12 Units into the skin at bedtime.   insulin lispro (HUMALOG) 100 UNIT/ML injection Inject  6 Units into the skin 3 (three) times daily with meals.   lactulose (CHRONULAC) 10 GM/15ML solution Take 60 mLs by mouth 3 (three) times daily.   levothyroxine (SYNTHROID, LEVOTHROID) 50 MCG tablet Take 50 mcg by mouth daily before breakfast.    miconazole (ANTIFUNGAL) 2 % cream Apply 1 Application topically every 8 (eight) hours as needed (For redness and to abdominal folds for yeast).   rifaximin (XIFAXAN) 550 MG TABS tablet Take 550 mg by mouth 2 (two) times daily.    simvastatin (ZOCOR) 20 MG tablet Take 20 mg by mouth at bedtime.    spironolactone (ALDACTONE) 50 MG tablet Take 50 mg by mouth daily.   torsemide (DEMADEX) 20 MG tablet Take 20 mg by mouth daily.   Zinc 50 MG TABS Give one tablet by mouth in the evening   blood glucose meter kit and supplies KIT Dispense based on patient and insurance preference. Use up to four times daily as directed. (FOR ICD-9 250.00, 250.01).   No facility-administered encounter medications on file as of 06/23/2022.    Review of Systems  Constitutional:  Negative for activity change, appetite change, fatigue and unexpected weight change.  HENT:  Negative for congestion and hearing loss.   Eyes: Negative.   Respiratory:  Negative for cough and shortness of breath.   Cardiovascular:  Negative for chest pain, palpitations and leg swelling.  Gastrointestinal:  Negative for abdominal pain, constipation and diarrhea.  Genitourinary:  Negative for difficulty urinating and dysuria.  Musculoskeletal:  Negative for arthralgias and myalgias.  Skin:  Negative for color change and wound.  Neurological:  Negative for dizziness and weakness.  Psychiatric/Behavioral:  Negative for agitation, behavioral problems and confusion.      Immunization History  Administered Date(s) Administered   Hep A / Hep B 09/11/2013, 10/21/2013, 03/25/2014   Influenza-Unspecified 11/26/2013, 11/27/2014, 12/30/2015, 12/24/2018, 01/13/2020   PFIZER Comirnaty(Gray Top)Covid-19  Tri-Sucrose Vaccine 04/03/2019, 04/24/2019, 12/26/2019   PFIZER(Purple Top)SARS-COV-2 Vaccination 04/03/2019, 04/24/2019   Pfizer Covid-19 Vaccine Bivalent Booster 59yrs & up 01/12/2021   Pneumococcal Conjugate-13 04/13/2017   Pneumococcal Polysaccharide-23 12/30/2015   Zoster Recombinat (Shingrix) 06/12/2017   Pertinent  Health Maintenance Due  Topic Date Due   FOOT EXAM  Never done   OPHTHALMOLOGY EXAM  Never done   DEXA SCAN  Never done   HEMOGLOBIN A1C  12/07/2022      11/19/2020    1:53 PM 11/19/2020   11:00 PM 11/20/2020    8:00 AM 11/20/2020   10:00 PM 11/21/2020    7:05 AM  Fall Risk  (RETIRED) Patient Fall Risk Level Moderate fall risk High fall risk High fall risk High fall risk High fall risk   Functional Status Survey:    Vitals:   06/23/22 0820  BP: 102/68  Pulse: 78  Resp: 18  Temp: 97.6 F (36.4 C)  SpO2: 92%  Weight: 161 lb (73 kg)  Height: 5' (1.524 m)  Body mass index is 31.44 kg/m. Physical Exam Constitutional:      General: She is not in acute distress.    Appearance: She is well-developed. She is not diaphoretic.  HENT:     Head: Normocephalic and atraumatic.     Mouth/Throat:     Pharynx: No oropharyngeal exudate.  Eyes:     Conjunctiva/sclera: Conjunctivae normal.     Pupils: Pupils are equal, round, and reactive to light.  Cardiovascular:     Rate and Rhythm: Normal rate and regular rhythm.     Heart sounds: Normal heart sounds.  Pulmonary:     Effort: Pulmonary effort is normal.     Breath sounds: Normal breath sounds.  Abdominal:     General: Bowel sounds are normal.     Palpations: Abdomen is soft.  Musculoskeletal:     Cervical back: Normal range of motion and neck supple.     Right lower leg: No edema.     Left lower leg: No edema.  Skin:    General: Skin is warm and dry.  Neurological:     Mental Status: She is alert.  Psychiatric:        Mood and Affect: Mood normal.     Labs reviewed: Recent Labs    06/06/22 0000   NA 131*  K 3.7  CL 92*  CO2 28*  BUN 49*  CREATININE 1.7*  CALCIUM 9.7   Recent Labs    06/06/22 0000  AST 52*  ALT 60*  ALKPHOS 122  ALBUMIN 3.4*   Recent Labs    06/06/22 0000  WBC 4.2  NEUTROABS 3,049.00  HGB 10.2*  HCT 30*  PLT 49*   Lab Results  Component Value Date   TSH 2.319 11/20/2020   Lab Results  Component Value Date   HGBA1C 6.3 06/06/2022   Lab Results  Component Value Date   CHOL 195 04/23/2016   HDL 43 04/23/2016   LDLCALC 138 (H) 04/23/2016   TRIG 70 04/23/2016   CHOLHDL 4.5 04/23/2016    Significant Diagnostic Results in last 30 days:  No results found.  Assessment/Plan 1. Hepatic cirrhosis, unspecified hepatic cirrhosis type (HCC) Stable on lactulose, rifaximin, torsemide and spironolactone.  Her mentation is back to baseline. Has done well with therapy.  Stable for discharge home. She will be doing outpatient therapy at [facility]. No DME needed.   2. Stage 3b chronic kidney disease ([place]) -Chronic and stable Encourage proper hydration Follow metabolic panel Avoid nephrotoxic meds (NSAIDS)  3. Benign essential hypertension -Blood pressure well controlled, goal bp <140/90 Continue current medications and dietary modifications follow metabolic panel  4. Hepatic encephalopathy ([place]) -has improved with lactulose, mentation is at baseline.  5. Type 2 diabetes mellitus with peripheral neuropathy (HCC) Controlled on current regimen.     Carlos American. [place], [facility] [phone]

## 2022-06-24 ENCOUNTER — Other Ambulatory Visit: Payer: Self-pay | Admitting: Student

## 2022-06-24 DIAGNOSIS — E1142 Type 2 diabetes mellitus with diabetic polyneuropathy: Secondary | ICD-10-CM

## 2022-06-24 MED ORDER — INSULIN GLARGINE 100 UNIT/ML ~~LOC~~ SOLN: 12.0000 [IU] | Freq: Every day | SUBCUTANEOUS | 0 refills | Status: DC

## 2022-06-24 MED ORDER — INSULIN LISPRO 100 UNIT/ML IJ SOLN: 6.0000 [IU] | Freq: Three times a day (TID) | INTRAMUSCULAR | 0 refills | Status: DC

## 2022-06-24 NOTE — Progress Notes (Signed)
Refill for DM medications for discharge.

## 2022-07-22 DIAGNOSIS — Z951 Presence of aortocoronary bypass graft: Secondary | ICD-10-CM | POA: Insufficient documentation

## 2022-07-22 DIAGNOSIS — E039 Hypothyroidism, unspecified: Secondary | ICD-10-CM | POA: Insufficient documentation

## 2022-07-22 DIAGNOSIS — I5030 Unspecified diastolic (congestive) heart failure: Secondary | ICD-10-CM | POA: Insufficient documentation

## 2022-07-22 DIAGNOSIS — G9340 Encephalopathy, unspecified: Secondary | ICD-10-CM | POA: Insufficient documentation

## 2022-08-03 ENCOUNTER — Non-Acute Institutional Stay (SKILLED_NURSING_FACILITY): Payer: Medicare Other | Admitting: Student

## 2022-08-03 ENCOUNTER — Encounter: Payer: Self-pay | Admitting: Student

## 2022-08-03 DIAGNOSIS — N178 Other acute kidney failure: Secondary | ICD-10-CM | POA: Diagnosis not present

## 2022-08-03 DIAGNOSIS — G9340 Encephalopathy, unspecified: Secondary | ICD-10-CM

## 2022-08-03 DIAGNOSIS — D696 Thrombocytopenia, unspecified: Secondary | ICD-10-CM | POA: Diagnosis not present

## 2022-08-03 DIAGNOSIS — K7469 Other cirrhosis of liver: Secondary | ICD-10-CM

## 2022-08-03 DIAGNOSIS — I4892 Unspecified atrial flutter: Secondary | ICD-10-CM

## 2022-08-03 DIAGNOSIS — I5032 Chronic diastolic (congestive) heart failure: Secondary | ICD-10-CM

## 2022-08-03 DIAGNOSIS — N1831 Chronic kidney disease, stage 3a: Secondary | ICD-10-CM

## 2022-08-03 DIAGNOSIS — E039 Hypothyroidism, unspecified: Secondary | ICD-10-CM

## 2022-08-03 DIAGNOSIS — I25810 Atherosclerosis of coronary artery bypass graft(s) without angina pectoris: Secondary | ICD-10-CM

## 2022-08-03 DIAGNOSIS — I10 Essential (primary) hypertension: Secondary | ICD-10-CM

## 2022-08-03 DIAGNOSIS — I480 Paroxysmal atrial fibrillation: Secondary | ICD-10-CM

## 2022-08-03 DIAGNOSIS — I8510 Secondary esophageal varices without bleeding: Secondary | ICD-10-CM

## 2022-08-03 NOTE — Progress Notes (Signed)
Provider:  Dr. Earnestine Mealing Location:  Other [facility].  Nursing Home Room Number: [facility] 111A Place of Service:  SNF (31)  PCP: Barbette Reichmann, MD Patient Care Team: Barbette Reichmann, MD as PCP - General (Internal Medicine)  Extended Emergency Contact Information Primary Emergency Contact: Cogbill,Richard P Address: [address] [place], [place] [address] [place] Home Phone: [phone] Relation: Spouse Secondary Emergency Contact: Lucas,Kristy [place] Home Phone: [phone] Relation: Daughter  Code Status: Full Code Goals of Care: Advanced Directive information    08/03/2022   11:17 AM  Advanced Directives  Does Patient Have a Medical Advance Directive? No  Would patient like information on creating a medical advance directive? No - Patient declined      Chief Complaint  Patient presents with   New Admit To SNF    Admission.     HPI: Patient is a 82 y.o. female seen today for admission to [facility]. 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  Patient was admitted for acute encephalopathy with severe hypercalcemia due to primary hyperparathyroidism. She was treated empirically for SBP given her encephalopathy. Hospitalization was also complicated by hyponatremia. She presented to the hospital with 1 week of confusion, decreased PO intake nausea, vomiting, polyuria found to have hypercalcemia on CMP of 13.9. Patient has had elevated calcium in the passed and discharged on cinacalcet. She is now on Cinicalcet 30 mg BID and will follow up with endocrinology. Hospitalization also complicated by UTI with AKI - s/p treatment with CTX. CT head with no acute abnormalities. CTAP shows known cirrhosis.  Patient was admitted to the facility in march for  a similar issue in March 2024.   Patient is alert and oriented to self, place, situation. She has a difficult time discussing her admission due to all of it being foggy. 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   Sleep apnea    SOB (shortness of breath)    Stroke (HCC)    Temporal arteritis (HCC)    Thrombocytopenia (HCC)    Thrombocytopenia (HCC)    Past Surgical History:  Procedure Laterality Date   COLONOSCOPY  10/24/2013, 2002   hyperplastic polpys   CORONARY ANGIOPLASTY     CORONARY ARTERY BYPASS GRAFT  09/1998   ESOPHAGOGASTRODUODENOSCOPY (EGD) WITH PROPOFOL N/A 08/26/2016   Procedure: ESOPHAGOGASTRODUODENOSCOPY (EGD) WITH PROPOFOL;  Surgeon: Scot Jun, MD;  Location: [facility] ENDOSCOPY;  Service: Endoscopy;  Laterality: N/A;   ESOPHAGOGASTRODUODENOSCOPY (EGD) WITH PROPOFOL N/A 02/05/2018   Procedure: ESOPHAGOGASTRODUODENOSCOPY (EGD) WITH PROPOFOL;  Surgeon: Lynnae Prude  T, MD;  Location: [facility] ENDOSCOPY;  Service: Endoscopy;  Laterality: N/A;   ESOPHAGOGASTRODUODENOSCOPY (EGD) WITH PROPOFOL N/A 07/22/2019   Procedure: ESOPHAGOGASTRODUODENOSCOPY (EGD) WITH PROPOFOL;  Surgeon: Toledo, Boykin Nearing, MD;  Location: [facility] ENDOSCOPY;  Service: Gastroenterology;  Laterality: N/A;   ESOPHAGOGASTRODUODENOSCOPY ENDOSCOPY  05/24/2011   hemorroid surgery     JOINT REPLACEMENT  2003  2005   bil.knees    reports that she has never smoked. She has never  used smokeless tobacco. She reports that she does not drink alcohol and does not use drugs. Social History   Socioeconomic History   Marital status: Married    Spouse name: [employer]   Number of children: Not on file   Years of education: Not on file   Highest education level: Not on file  Occupational History   Not on file  Tobacco Use   Smoking status: Never   Smokeless tobacco: Never  Vaping Use   Vaping Use: Never used  Substance and Sexual Activity   Alcohol use: No    Alcohol/week: 0.0 standard drinks of alcohol   Drug use: No   Sexual activity: Not on file  Other Topics Concern   Not on file  Social History Narrative   Not on file   Social Determinants of Health   Financial Resource Strain: Not on file  Food Insecurity: Not on file  Transportation Needs: Not on file  Physical Activity: Not on file  Stress: Not on file  Social Connections: Not on file  Intimate Partner Violence: Not on file    Functional Status Survey:    Family History  Problem Relation Age of Onset   Heart disease Other    Hypertension Other    Anemia Other    Colon cancer Other    CVA Mother    Dementia Mother    CAD Father    Breast cancer Neg Hx     Health Maintenance  Topic Date Due   FOOT EXAM  Never done   OPHTHALMOLOGY EXAM  Never done   Diabetic kidney evaluation - Urine ACR  Never done   DTaP/Tdap/Td (1 - Tdap) Never done   DEXA SCAN  Never done   Zoster Vaccines- Shingrix (2 of 2) 08/07/2017   COVID-19 Vaccine (7 - 2023-24 season) 11/26/2021   HEMOGLOBIN A1C  12/07/2022   Diabetic kidney evaluation - eGFR measurement  06/06/2023   Pneumonia Vaccine 68+ Years old  Completed   HPV VACCINES  Aged Out    Allergies  Allergen Reactions   Tape Rash   Morphine And Related Nausea And Vomiting    Other reaction(s): Nausea And Vomiting   Other Rash    NEOPRENE, TAPE, BAND-AID TOUGH STRIPS    Outpatient Encounter Medications as of 08/03/2022  Medication Sig    acetaminophen (TYLENOL CHILDRENS PAIN + FEVER) 160 MG/5ML suspension Take 20.3 mLs by mouth every 8 (eight) hours as needed.   alendronate (FOSAMAX) 70 MG tablet Take 70 mg by mouth every Monday. Take with a full glass of water on an empty stomach.   blood glucose meter kit and supplies KIT Dispense based on patient and insurance preference. Use up to four times daily as directed. (FOR ICD-9 250.00, 250.01).   cinacalcet (SENSIPAR) 30 MG tablet Take 30 mg by mouth 2 (two) times daily with a meal.   dapagliflozin propanediol (FARXIGA) 10 MG TABS tablet Take 10 mg by mouth daily.   diclofenac Sodium (VOLTAREN) 1 % GEL Apply 2 g topically every  6 (six) hours as needed.   ferrous sulfate 325 (65 FE) MG tablet Take 325 mg by mouth daily. Monday,Wednesday and Friday.   Infant Care Products [facility] EX) Apply to buttocks/peri area topically every shift.   insulin lispro (HUMALOG) 100 UNIT/ML injection Inject per sliding scale: 70-150=o units, 151-200= 0 units, 201-250= 1 units, 251-300= 2 units, 301-350=3 units, 351-400= 4 units.   lactulose (CHRONULAC) 10 GM/15ML solution Take 45 mLs by mouth 3 (three) times daily.   levothyroxine (SYNTHROID) 25 MCG tablet Take 25 mcg by mouth daily before breakfast.   melatonin 1 MG TABS tablet Take 1 mg by mouth at bedtime.   nystatin (MYCOSTATIN/NYSTOP) powder Apply 1 Application topically 3 (three) times daily.   potassium chloride SA (KLOR-CON M) 20 MEQ tablet Take 20 mEq by mouth daily.   rifaximin (XIFAXAN) 550 MG TABS tablet Take 550 mg by mouth 2 (two) times daily.    simvastatin (ZOCOR) 20 MG tablet Take 20 mg by mouth at bedtime.    spironolactone (ALDACTONE) 25 MG tablet Take 12.5 mg by mouth 2 (two) times daily.   torsemide (DEMADEX) 20 MG tablet Take 20 mg by mouth daily.   Zinc Oxide (TRIPLE PASTE) 12.8 % ointment Apply 1 Application topically. Every shift.   [DISCONTINUED] Calcium Carbonate-Vitamin D 600-200 MG-UNIT TABS Take 1 tablet by mouth daily.    [DISCONTINUED] insulin glargine (LANTUS) 100 UNIT/ML injection Inject 0.12 mLs (12 Units total) into the skin at bedtime.   [DISCONTINUED] insulin lispro (HUMALOG) 100 UNIT/ML injection Inject 0.06 mLs (6 Units total) into the skin 3 (three) times daily with meals. (Patient taking differently: Inject as per sliding scale if 70-150=0units, 151-200=0units, 201-250=1 units, 251-300= 2 units, 301-350=3units, 351-400= 4 units.)   [DISCONTINUED] miconazole (ANTIFUNGAL) 2 % cream Apply 1 Application topically every 8 (eight) hours as needed (For redness and to abdominal folds for yeast).   [DISCONTINUED] Zinc 50 MG TABS Give one tablet by mouth in the evening   No facility-administered encounter medications on file as of 08/03/2022.    Review of Systems  Vitals:   08/03/22 1059  BP: 100/67  Pulse: 79  Resp: 16  Temp: 97.9 F (36.6 C)  SpO2: 92%  Weight: 158 lb 3.2 oz (71.8 kg)  Height: 5' (1.524 m)   Body mass index is 30.9 kg/m. Physical Exam Constitutional:      Appearance: Normal appearance.  Cardiovascular:     Rate and Rhythm: Normal rate. Rhythm irregular.     Pulses: Normal pulses.  Pulmonary:     Effort: Pulmonary effort is normal.  Abdominal:     Palpations: Abdomen is soft.  Skin:    General: Skin is warm.  Neurological:     Mental Status: She is alert and oriented to person, place, and time.     Labs reviewed: Basic Metabolic Panel: Recent Labs    06/06/22 0000  NA 131*  K 3.7  CL 92*  CO2 28*  BUN 49*  CREATININE 1.7*  CALCIUM 9.7   Liver Function Tests: Recent Labs    06/06/22 0000  AST 52*  ALT 60*  ALKPHOS 122  ALBUMIN 3.4*   No results for input(s): "LIPASE", "AMYLASE" in the last 8760 hours. No results for input(s): "AMMONIA" in the last 8760 hours. CBC: Recent Labs    06/06/22 0000  WBC 4.2  NEUTROABS 3,049.00  HGB 10.2*  HCT 30*  PLT 49*   Cardiac Enzymes: No results for input(s): "CKTOTAL", "CKMB", "CKMBINDEX", "TROPONINI" in  the  last 8760 hours. BNP: Invalid input(s): "POCBNP" Lab Results  Component Value Date   HGBA1C 6.3 06/06/2022   Lab Results  Component Value Date   TSH 2.319 11/20/2020   Lab Results  Component Value Date   VITAMINB12 956 (H) 11/20/2020   No results found for: "FOLATE" Lab Results  Component Value Date   IRON 154 01/16/2017   TIBC 273 01/16/2017   FERRITIN 66 01/16/2017    Imaging and Procedures obtained prior to SNF admission: US Abdomen Limited RUQ (LIVER/GB)  Result Date: 05/11/2021 CLINICAL DATA:  Cirrhosis EXAM: ULTRASOUND ABDOMEN LIMITED RIGHT UPPER QUADRANT COMPARISON:  05/14/2020 FINDINGS: Gallbladder: Multiple layering gallstones are seen within the gallbladder. The gallbladder, however, is not distended, there is no gallbladder wall thickening, and no pericholecystic fluid is identified. The sonographic Eulah Pont sign is reportedly negative. Common bile duct: Diameter: 3 mm in proximal diameter Liver: The liver is diffusely nodular and the hepatic echotexture is diffusely coarsened in keeping with changes of cirrhosis. A 12 mm simple subserosal cyst is developed within the right hepatic lobe. No focal solid intrahepatic masses are identified, however. No intrahepatic biliary ductal dilation. Portal vein is patent on color Doppler imaging with normal direction of blood flow towards the liver. Other: Mild ascites is present, increased when compared to prior examination, but similar to that seen on prior CT examination of 08/01/2019. IMPRESSION: Cholelithiasis without sonographic evidence of acute cholecystitis. Parenchymal changes in keeping with cirrhosis. No focal intrahepatic mass. Mild ascites. Electronically Signed   By: Helyn Numbers M.D.   On: 05/11/2021 17:28    Assessment/Plan 1. Acute encephalopathy Mental status is improving at this time, however, she is not back to her baseline. Previously discussed concern for continued decline in mental status with numerous  readmissions. Continue goals of care conversations with family. Delirium precautions --OOB for meals, maintain sleep-wake cycle.  2. Thrombocytopenia (HCC) Chronic due to cirrhosis. CBC ordered.   3. Acute renal failure with other specified pathological kidney lesion superimposed on stage 3a chronic kidney disease (HCC) AKI improving at discharge. Repeat labs ordered.   4. Other cirrhosis of liver (HCC) Ascites seen on most recent abdominal imaging. Continue diuretics spironolactone and torsemide.   5. Chronic heart failure with preserved ejection fraction (HCC) Lungs clear. Continue torsemide and Farxiga  6. Secondary esophageal varices without bleeding (HCC) Known esophageal varices secondary to cirrhosis. No bleeding at this time. She is at high risk if she develops hematemesis. Continue to monitor.   7. Benign essential hypertension BP on the lower end likely due to underlying cirrhosis and CHF. Continue spironolactone and torsemide.   8. Paroxysmal A-fib (HCC) 9. Atrial flutter, paroxysmal (HCC) Rate well-controlled at this time. CTM  10. Coronary artery disease involving coronary bypass graft without angina pectoris, unspecified whether native or transplanted heart No angina, ctm.   11. Hypothyroidism, adult Continue current dosing of levothyroxine 25 mcg  12. Hypercalcemia Patient on BID cinacalcet 30 mg BID. Plan to repeat BMP to evaluate calcium levels and determine dosing.    Family/ staff Communication: nursing  Labs/tests ordered: BMP and CBC

## 2022-08-04 LAB — CBC AND DIFFERENTIAL
HCT: 30 — AB (ref 36–46)
Hemoglobin: 10.1 — AB (ref 12.0–16.0)
Neutrophils Absolute: 5306
Platelets: 67 10*3/uL — AB (ref 150–400)
WBC: 6.7

## 2022-08-04 LAB — BASIC METABOLIC PANEL
BUN: 44 — AB (ref 4–21)
CO2: 19 (ref 13–22)
Chloride: 102 (ref 99–108)
Creatinine: 1.8 — AB (ref 0.5–1.1)
Glucose: 227
Potassium: 3.1 mEq/L — AB (ref 3.5–5.1)
Sodium: 133 — AB (ref 137–147)

## 2022-08-04 LAB — COMPREHENSIVE METABOLIC PANEL
Calcium: 6.4 — AB (ref 8.7–10.7)
eGFR: 28

## 2022-08-04 LAB — CBC: RBC: 3.13 — AB (ref 3.87–5.11)

## 2022-08-11 ENCOUNTER — Encounter: Payer: Self-pay | Admitting: Adult Health

## 2022-08-11 ENCOUNTER — Non-Acute Institutional Stay (SKILLED_NURSING_FACILITY): Payer: Medicare Other | Admitting: Adult Health

## 2022-08-11 DIAGNOSIS — E876 Hypokalemia: Secondary | ICD-10-CM

## 2022-08-11 DIAGNOSIS — I5032 Chronic diastolic (congestive) heart failure: Secondary | ICD-10-CM | POA: Diagnosis not present

## 2022-08-11 DIAGNOSIS — N1831 Chronic kidney disease, stage 3a: Secondary | ICD-10-CM

## 2022-08-11 DIAGNOSIS — N178 Other acute kidney failure: Secondary | ICD-10-CM | POA: Diagnosis not present

## 2022-08-11 LAB — BASIC METABOLIC PANEL
BUN: 28 — AB (ref 4–21)
CO2: 19 (ref 13–22)
Chloride: 101 (ref 99–108)
Creatinine: 1.7 — AB (ref 0.5–1.1)
Glucose: 93
Potassium: 4 mEq/L (ref 3.5–5.1)
Sodium: 131 — AB (ref 137–147)

## 2022-08-11 LAB — COMPREHENSIVE METABOLIC PANEL
Calcium: 5.1 — AB (ref 8.7–10.7)
eGFR: 29

## 2022-08-11 NOTE — Progress Notes (Signed)
Location:  Other [facility]) Nursing Home Room Number: [facility] 111A Place of Service:  SNF (31) Provider:  Kenard Gower, DNP, FNP-BC  Patient Care Team: Barbette Reichmann, MD as PCP - General (Internal Medicine)  Extended Emergency Contact Information Primary Emergency Contact: Jeffrey,Richard P Address: [address] [place], [place] [address] [place] Home Phone: [phone] Relation: Spouse Secondary Emergency Contact: Lucas,Kristy [place] Home Phone: [phone] Relation: Daughter  Code Status:  Full Code  Goals of care: Advanced Directive information    08/03/2022   11:17 AM  Advanced Directives  Does Patient Have a Medical Advance Directive? No  Would patient like information on creating a medical advance directive? No - Patient declined     Chief Complaint  Patient presents with   Acute Visit    Low calcium     HPI:  Pt is a 82 y.o. female seen today for low calcium. She is a short-term care resident of [facility]. She takes Cinacalcet for hypercalcemia. Calcium level 5.1, low, 08/11/22. She denies having symptoms of tetany. She denies shortness of breath. She takes Torsemide and Aldactone for CHF.   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   Sleep apnea    SOB (shortness of breath)    Stroke (HCC)    Temporal arteritis (HCC)    Thrombocytopenia (HCC)    Thrombocytopenia (HCC)     Past Surgical History:  Procedure Laterality Date   COLONOSCOPY  10/24/2013, 2002   hyperplastic polpys   CORONARY ANGIOPLASTY     CORONARY ARTERY BYPASS GRAFT  09/1998   ESOPHAGOGASTRODUODENOSCOPY (EGD) WITH PROPOFOL N/A 08/26/2016   Procedure: ESOPHAGOGASTRODUODENOSCOPY (EGD) WITH PROPOFOL;  Surgeon: Scot Jun, MD;  Location: [facility] ENDOSCOPY;  Service: Endoscopy;  Laterality: N/A;   ESOPHAGOGASTRODUODENOSCOPY (EGD) WITH PROPOFOL N/A 02/05/2018   Procedure: ESOPHAGOGASTRODUODENOSCOPY (EGD) WITH PROPOFOL;  Surgeon: Scot Jun, MD;  Location: [facility] ENDOSCOPY;  Service: Endoscopy;  Laterality: N/A;   ESOPHAGOGASTRODUODENOSCOPY (EGD) WITH PROPOFOL N/A 07/22/2019   Procedure: ESOPHAGOGASTRODUODENOSCOPY (EGD) WITH PROPOFOL;  Surgeon: Toledo, Boykin Nearing, MD;  Location: [facility] ENDOSCOPY;  Service: Gastroenterology;  Laterality: N/A;   ESOPHAGOGASTRODUODENOSCOPY ENDOSCOPY  05/24/2011   hemorroid surgery     JOINT REPLACEMENT  2003  2005   bil.knees    Allergies  Allergen Reactions   Tape Rash   Morphine And Codeine Nausea And Vomiting    Other reaction(s): Nausea And Vomiting   Other Rash    NEOPRENE, TAPE, BAND-AID TOUGH STRIPS    Outpatient Encounter Medications as of 08/11/2022  Medication Sig   acetaminophen (TYLENOL CHILDRENS PAIN + FEVER) 160 MG/5ML suspension Take 20.3 mLs by mouth every 8 (eight) hours as needed.   alendronate (FOSAMAX) 70 MG tablet Take 70 mg by  mouth every Monday. Take with a full glass of water on an empty stomach.   cinacalcet (SENSIPAR) 30 MG tablet Take 30 mg by mouth 2 (two) times daily with a meal.   dapagliflozin propanediol (FARXIGA) 10 MG TABS tablet Take 10 mg by mouth daily.   diclofenac Sodium (VOLTAREN) 1 % GEL Apply 2 g topically every 6 (six) hours as needed.   ferrous sulfate 325 (65 FE) MG tablet Take 325 mg by mouth daily. Monday,Wednesday and Friday.   Infant Care Products [facility] EX) Apply to buttocks/peri area topically every  shift.   insulin lispro (HUMALOG) 100 UNIT/ML injection Inject per sliding scale: 70-150=o units, 151-200= 0 units, 201-250= 1 units, 251-300= 2 units, 301-350=3 units, 351-400= 4 units.   lactulose (CHRONULAC) 10 GM/15ML solution Take 45 mLs by mouth 3 (three) times daily.   levothyroxine (SYNTHROID) 25 MCG tablet Take 25 mcg by mouth daily before breakfast.   melatonin 1 MG TABS tablet Take 1 mg by mouth at bedtime.   nystatin (MYCOSTATIN/NYSTOP) powder Apply 1 Application topically 3 (three) times daily.   potassium chloride SA (KLOR-CON M) 20 MEQ tablet Take 20 mEq by mouth daily.   rifaximin (XIFAXAN) 550 MG TABS tablet Take 550 mg by mouth 2 (two) times daily.    simvastatin (ZOCOR) 20 MG tablet Take 20 mg by mouth at bedtime.    spironolactone (ALDACTONE) 25 MG tablet Take 12.5 mg by mouth 2 (two) times daily.   torsemide (DEMADEX) 20 MG tablet Take 20 mg by mouth daily.   [DISCONTINUED] blood glucose meter kit and supplies KIT Dispense based on patient and insurance preference. Use up to four times daily as directed. (FOR ICD-9 250.00, 250.01).   [DISCONTINUED] Zinc Oxide (TRIPLE PASTE) 12.8 % ointment Apply 1 Application topically. Every shift.   No facility-administered encounter medications on file as of 08/11/2022.    Review of Systems  Constitutional:  Negative for appetite change, chills, fatigue and fever.  HENT:  Negative for congestion, hearing loss, rhinorrhea and sore throat.   Eyes: Negative.   Respiratory:  Negative for cough, shortness of breath and wheezing.   Cardiovascular:  Positive for leg swelling. Negative for chest pain and palpitations.  Gastrointestinal:  Negative for abdominal pain, constipation, diarrhea, nausea and vomiting.  Genitourinary:  Negative for dysuria.  Musculoskeletal:  Negative for arthralgias, back pain and myalgias.  Skin:  Negative for color change, rash and wound.  Neurological:  Negative for dizziness, weakness and headaches.   Psychiatric/Behavioral:  Negative for behavioral problems. The patient is not nervous/anxious.        Immunization History  Administered Date(s) Administered   Hep A / Hep B 09/11/2013, 10/21/2013, 03/25/2014   Influenza-Unspecified 11/26/2013, 11/27/2014, 12/30/2015, 12/24/2018, 01/13/2020   PFIZER Comirnaty(Gray Top)Covid-19 Tri-Sucrose Vaccine 04/03/2019, 04/24/2019, 12/26/2019   PFIZER(Purple Top)SARS-COV-2 Vaccination 04/03/2019, 04/24/2019   Pfizer Covid-19 Vaccine Bivalent Booster 92yrs & up 01/12/2021   Pneumococcal Conjugate-13 04/13/2017   Pneumococcal Polysaccharide-23 12/30/2015   RSV,unspecified 06/08/2022   Zoster Recombinat (Shingrix) 06/12/2017   Pertinent  Health Maintenance Due  Topic Date Due   FOOT EXAM  Never done   OPHTHALMOLOGY EXAM  Never done   DEXA SCAN  Never done   HEMOGLOBIN A1C  12/07/2022      11/19/2020    1:53 PM 11/19/2020   11:00 PM 11/20/2020    8:00 AM 11/20/2020   10:00 PM 11/21/2020    7:05 AM  Fall Risk  (RETIRED) Patient Fall Risk Level Moderate fall risk  High fall risk High fall risk High fall risk High fall risk     Vitals:   08/11/22 1411  BP: 134/62  Pulse: 79  Resp: 20  Temp: 97.7 F (36.5 C)  SpO2: 99%  Weight: 169 lb (76.7 kg)  Height: 5' (1.524 m)   Body mass index is 33.01 kg/m.  Physical Exam Constitutional:      Appearance: She is obese.  HENT:     Head: Normocephalic and atraumatic.     Nose: Nose normal.     Mouth/Throat:     Mouth: Mucous membranes are moist.  Eyes:     Conjunctiva/sclera: Conjunctivae normal.  Cardiovascular:     Rate and Rhythm: Normal rate and regular rhythm.  Pulmonary:     Effort: Pulmonary effort is normal.     Breath sounds: Normal breath sounds.  Abdominal:     General: Bowel sounds are normal.     Palpations: Abdomen is soft.  Musculoskeletal:        General: Swelling present. Normal range of motion.     Cervical back: Normal range of motion.     Right lower leg: Edema  present.     Left lower leg: Edema present.     Comments: BLE 1+edema  Skin:    General: Skin is warm and dry.  Neurological:     Mental Status: She is alert. Mental status is at baseline.     Comments: Alert to self and place, disoriented to time.  Psychiatric:        Mood and Affect: Mood normal.        Behavior: Behavior normal.      Labs reviewed: Recent Labs    06/06/22 0000 08/04/22 0000  NA 131* 133*  K 3.7 3.1*  CL 92* 102  CO2 28* 19  BUN 49* 44*  CREATININE 1.7* 1.8*  CALCIUM 9.7 6.4*   Recent Labs    06/06/22 0000  AST 52*  ALT 60*  ALKPHOS 122  ALBUMIN 3.4*   Recent Labs    06/06/22 0000 08/04/22 0000  WBC 4.2 6.7  NEUTROABS 3,049.00 5,306.00  HGB 10.2* 10.1*  HCT 30* 30*  PLT 49* 67*   Lab Results  Component Value Date   TSH 2.319 11/20/2020   Lab Results  Component Value Date   HGBA1C 6.3 06/06/2022   Lab Results  Component Value Date   CHOL 195 04/23/2016   HDL 43 04/23/2016   LDLCALC 138 (H) 04/23/2016   TRIG 70 04/23/2016   CHOLHDL 4.5 04/23/2016    Significant Diagnostic Results in last 30 days:  No results found.  Assessment/Plan  1. Hypocalcemia -  calcium 5.1, very low -  will discontinue Cinacalcet and repeat Calcium level in a week  2. Acute renal failure with other specified pathological kidney lesion superimposed on stage 3a chronic kidney disease (HCC) Lab Results  Component Value Date   NA 133 (A) 08/04/2022   K 3.1 (A) 08/04/2022   CO2 19 08/04/2022   GLUCOSE 129 (H) 11/21/2020   BUN 44 (A) 08/04/2022   CREATININE 1.8 (A) 08/04/2022   CALCIUM 6.4 (A) 08/04/2022   EGFR 28 08/04/2022   GFRNONAA 26 (L) 11/21/2020   -  repeat BMP in a week -  avoid nephrotoxics  3. Chronic heart failure with preserved ejection fraction (HCC) -  no shortness of breat  4. Hypokalemia Lab Results  Component Value Date   K 3.1 (A) 08/04/2022   -  repeat K  4.0, 08/11/22 -  continue KCL supplementation    Family/  staff Communication: Discussed plan of care with resident and charge nurse.  Labs/tests ordered:  BMP, phosphate and Magnesium in 1 week    Kenard Gower, DNP, MSN, FNP-BC [facility] [phone] (Monday-Friday 8:00 a.m. - 5:00 p.m.) [phone] (after hours)

## 2022-08-18 LAB — BASIC METABOLIC PANEL
BUN: 29 — AB (ref 4–21)
CO2: 23 — AB (ref 13–22)
Chloride: 98 — AB (ref 99–108)
Creatinine: 1.9 — AB (ref 0.5–1.1)
Glucose: 176
Potassium: 4 mEq/L (ref 3.5–5.1)
Sodium: 133 — AB (ref 137–147)

## 2022-08-18 LAB — COMPREHENSIVE METABOLIC PANEL
Calcium: 6.5 — AB (ref 8.7–10.7)
eGFR: 25

## 2022-08-19 ENCOUNTER — Non-Acute Institutional Stay (SKILLED_NURSING_FACILITY): Payer: Medicare Other | Admitting: Student

## 2022-08-19 ENCOUNTER — Encounter: Payer: Self-pay | Admitting: Student

## 2022-08-19 DIAGNOSIS — D696 Thrombocytopenia, unspecified: Secondary | ICD-10-CM

## 2022-08-19 DIAGNOSIS — R17 Unspecified jaundice: Secondary | ICD-10-CM

## 2022-08-19 DIAGNOSIS — N184 Chronic kidney disease, stage 4 (severe): Secondary | ICD-10-CM

## 2022-08-19 DIAGNOSIS — I5032 Chronic diastolic (congestive) heart failure: Secondary | ICD-10-CM

## 2022-08-19 DIAGNOSIS — K7460 Unspecified cirrhosis of liver: Secondary | ICD-10-CM

## 2022-08-19 LAB — HEPATIC FUNCTION PANEL
ALT: 45 U/L — AB (ref 7–35)
AST: 49 — AB (ref 13–35)
Alkaline Phosphatase: 142 — AB (ref 25–125)
Bilirubin, Total: 3.9

## 2022-08-19 LAB — COMPREHENSIVE METABOLIC PANEL
Albumin: 2.6 — AB (ref 3.5–5.0)
Globulin: 3.8

## 2022-08-19 NOTE — Progress Notes (Signed)
Location:  Other Nursing Home Room Number: [facility] 111A Place of Service:  SNF (31) Provider:  Vevelyn Royals, MD  Patient Care Team: Barbette Reichmann, MD as PCP - General (Internal Medicine)  Extended Emergency Contact Information Primary Emergency Contact: Schader,Richard P Address: [address] [place], [place] [address] [place] Home Phone: [phone] Relation: Spouse Secondary Emergency Contact: Lucas,Kristy [place] Home Phone: [phone] Relation: Daughter  Code Status:  Full Code Goals of care: Advanced Directive information    08/19/2022   11:04 AM  Advanced Directives  Does Patient Have a Medical Advance Directive? No  Would patient like information on creating a medical advance directive? No - Patient declined     Chief Complaint  Patient presents with   Acute Visit    Leg Swelling.     HPI:  Pt is a 82 y.o. female seen today for an acute visit for leg swelling and redness.   Spoke with patient, son, daughter, spouse.   Yesterday patient was noted to have increased swelling in bilateral lower extremities. Daughter noted redness and marked the area with concern that she could have cellulitis. Son states the leg is less swollen and redness looks more like a bruise than yesterday.   Discussed at length goals of care clarifications outlined below.  At this time the redness does not extend beyond previous marking.   Patient states she understands she has problems with liver and kidney and she really wants to go home. She states she would prefer to stay out of the hospital if possible.    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   Sleep apnea    SOB (shortness of breath)    Stroke (HCC)    Temporal arteritis (HCC)    Thrombocytopenia (HCC)    Thrombocytopenia (HCC)    Past Surgical History:  Procedure Laterality Date   COLONOSCOPY  10/24/2013, 2002   hyperplastic polpys   CORONARY ANGIOPLASTY     CORONARY ARTERY BYPASS GRAFT  09/1998   ESOPHAGOGASTRODUODENOSCOPY (EGD) WITH PROPOFOL N/A 08/26/2016   Procedure: ESOPHAGOGASTRODUODENOSCOPY (EGD) WITH PROPOFOL;  Surgeon: Scot Jun, MD;  Location[facility] ENDOSCOPY;  Service: Endoscopy;  Laterality: N/A;   ESOPHAGOGASTRODUODENOSCOPY (EGD) WITH PROPOFOL N/A 02/05/2018   Procedure: ESOPHAGOGASTRODUODENOSCOPY (EGD) WITH PROPOFOL;  Surgeon: Scot Jun, MD;  Location: [facility] ENDOSCOPY;  Service: Endoscopy;  Laterality: N/A;   ESOPHAGOGASTRODUODENOSCOPY (EGD) WITH PROPOFOL N/A 07/22/2019   Procedure: ESOPHAGOGASTRODUODENOSCOPY (EGD) WITH PROPOFOL;  Surgeon: Toledo, Boykin Nearing, MD;  Location: [facility] ENDOSCOPY;  Service: Gastroenterology;  Laterality: N/A;   ESOPHAGOGASTRODUODENOSCOPY ENDOSCOPY  05/24/2011   hemorroid surgery     JOINT REPLACEMENT  2003  2005   bil.knees    Allergies  Allergen Reactions   Tape  Rash   Morphine And Codeine Nausea And Vomiting    Other reaction(s): Nausea And Vomiting   Other Rash    NEOPRENE, TAPE, BAND-AID TOUGH STRIPS    Outpatient Encounter Medications as of 08/19/2022  Medication Sig   acetaminophen (TYLENOL CHILDRENS PAIN + FEVER) 160 MG/5ML suspension Take 20.3 mLs by mouth every 8 (eight) hours as needed.   alendronate (FOSAMAX) 70 MG tablet Take 70 mg by mouth every Monday. Take with a full glass of water on an empty stomach.   dapagliflozin propanediol (FARXIGA)  10 MG TABS tablet Take 10 mg by mouth daily.   diclofenac Sodium (VOLTAREN) 1 % GEL Apply 2 g topically every 6 (six) hours as needed.   ferrous sulfate 325 (65 FE) MG tablet Take 325 mg by mouth daily. Monday,Wednesday and Friday.   Infant Care Products [facility] EX) Apply to buttocks/peri area topically every shift.   insulin lispro (HUMALOG) 100 UNIT/ML injection Inject per sliding scale: 70-150=o units, 151-200= 0 units, 201-250= 1 units, 251-300= 2 units, 301-350=3 units, 351-400= 4 units.   insulin lispro (HUMALOG) 100 UNIT/ML injection Inject 6 Units into the skin 3 (three) times daily before meals.   levothyroxine (SYNTHROID) 25 MCG tablet Take 25 mcg by mouth daily before breakfast.   melatonin 1 MG TABS tablet Take 1 mg by mouth at bedtime.   nystatin (MYCOSTATIN/NYSTOP) powder Apply 1 Application topically 3 (three) times daily.   potassium chloride SA (KLOR-CON M) 20 MEQ tablet Take 20 mEq by mouth daily.   rifaximin (XIFAXAN) 550 MG TABS tablet Take 550 mg by mouth 2 (two) times daily.    simvastatin (ZOCOR) 20 MG tablet Take 20 mg by mouth at bedtime.    spironolactone (ALDACTONE) 25 MG tablet Take 12.5 mg by mouth 2 (two) times daily.   torsemide (DEMADEX) 20 MG tablet Take 20 mg by mouth daily.   [DISCONTINUED] cinacalcet (SENSIPAR) 30 MG tablet Take 30 mg by mouth 2 (two) times daily with a meal.   [DISCONTINUED] lactulose (CHRONULAC) 10 GM/15ML solution Take 45 mLs by mouth 3 (three) times daily.   No facility-administered encounter medications on file as of 08/19/2022.    Review of Systems  Immunization History  Administered Date(s) Administered   Hep A / Hep B 09/11/2013, 10/21/2013, 03/25/2014   Influenza-Unspecified 11/26/2013, 11/27/2014, 12/30/2015, 12/24/2018, 01/13/2020, 12/23/2021   PFIZER Comirnaty(Gray Top)Covid-19 Tri-Sucrose Vaccine 04/03/2019, 04/24/2019, 12/26/2019   PFIZER(Purple Top)SARS-COV-2 Vaccination 04/03/2019, 04/24/2019   Pfizer Covid-19 Vaccine  Bivalent Booster 61yrs & up 01/12/2021   Pneumococcal Conjugate-13 04/13/2017   Pneumococcal Polysaccharide-23 12/30/2015   RSV,unspecified 06/08/2022   Zoster Recombinat (Shingrix) 06/12/2017   Pertinent  Health Maintenance Due  Topic Date Due   FOOT EXAM  Never done   OPHTHALMOLOGY EXAM  Never done   DEXA SCAN  Never done   HEMOGLOBIN A1C  12/07/2022      11/19/2020    1:53 PM 11/19/2020   11:00 PM 11/20/2020    8:00 AM 11/20/2020   10:00 PM 11/21/2020    7:05 AM  Fall Risk  (RETIRED) Patient Fall Risk Level Moderate fall risk High fall risk High fall risk High fall risk High fall risk   Functional Status Survey:    Vitals:   08/19/22 1058  BP: 120/74  Pulse: 75  Resp: 16  Temp: 97.6 F (36.4 C)  SpO2: 93%  Weight: 170 lb 9.6 oz (77.4 kg)  Height: 5' (1.524 m)   Body mass index is 33.32 kg/m. Physical Exam  Constitutional:      Comments: Chronically ill-appearing  Cardiovascular:     Rate and Rhythm: Normal rate. Rhythm irregular.     Pulses: Normal pulses.  Pulmonary:     Effort: Pulmonary effort is normal.  Abdominal:     General: There is distension.     Tenderness: There is abdominal tenderness.  Skin:    Comments: LLE with large ecchymosis present with upper portion containing consolidated petechiae and scatter petechiae near the knee. 2+ edema bilaterally. Tenderness of bilateral shins -- does not extend to entirety of erythema.   Neurological:     Mental Status: She is alert and oriented to person, place, and time.     Labs reviewed: Recent Labs    08/04/22 0000 08/11/22 0000 08/18/22 0000  NA 133* 131* 133*  K 3.1* 4.0 4.0  CL 102 101 98*  CO2 19 19 23*  BUN 44* 28* 29*  CREATININE 1.8* 1.7* 1.9*  CALCIUM 6.4* 5.1* 6.5*   Recent Labs    06/06/22 0000  AST 52*  ALT 60*  ALKPHOS 122  ALBUMIN 3.4*   Recent Labs    06/06/22 0000 08/04/22 0000  WBC 4.2 6.7  NEUTROABS 3,049.00 5,306.00  HGB 10.2* 10.1*  HCT 30* 30*  PLT 49* 67*    Lab Results  Component Value Date   TSH 2.319 11/20/2020   Lab Results  Component Value Date   HGBA1C 6.3 06/06/2022   Lab Results  Component Value Date   CHOL 195 04/23/2016   HDL 43 04/23/2016   LDLCALC 138 (H) 04/23/2016   TRIG 70 04/23/2016   CHOLHDL 4.5 04/23/2016    Significant Diagnostic Results in last 30 days:  No results found.  Assessment/Plan Hepatic cirrhosis, unspecified hepatic cirrhosis type, unspecified whether ascites present (HCC)  CKD (chronic kidney disease), stage IV (HCC)  Hypocalcemia  Serum total bilirubin elevated  Hypomagnesemia  Thrombocytopenia (HCC)  Chronic heart failure with preserved ejection fraction [facility]) Patient with significantly abnormal liver function tests and renal function tests with elevated phosphorus, Calcium 7.5 when corrected for low albumin (2.2). Patient's eGFR continues to decrease with today's value at 25. Indirect Bilirubin 2.6. Magnesium 1.6. Patient with increased leg swelling and significant abdominal distension which is stable for her. Patient states she would like to go home. She knows she has changes in kidney and liver function and her only goal right now is to go home. Family would like to know with certainty she does not currently have a DVT. Discussed the only way we can be sure about this is if we get an ultrasound today, and patient states she would rather stay to have this happen. Discussed concern that patient's liver function continues to decline as well as her kidney function which is evident by hospitalizations getting closer together -- with each time she is more confused and weaker. Discussed concern that at this state of her life that she would benefit from more support at home since that is where she would like to be. She states she doesn't really want to go back to the hospital anymore. Mentioned concern that patient's signficant multiorgan failure could mean that it is time to transition to hospice to  make sure she can stay at home and stop going back and forth to the hospital. Plan to collect D-dimer (elevated at 14) and follow up ultrasound of leg to evaluate for DVT. Patient does not tolerate anticoagulation due to GIB and nose bleeds with previous attempts. If positive for  DVT, they would be okay with discharge with hospice. Ordered stat doppler which could take 12-24 hours for final read. There are some case studies that some patients also develop HSP in the setting of cirrhosis and kidney failure and developing Iga vasculitis. Genevieve Norlander, Okpe A, Pathak A. In Every Man, There Is a Child: Henoch-Schnlein Purpura in an Adult With Liver Cirrhosis. Cureus. 2021 Sep 25;13(9):e18270. doi: 10.7759/cureus.18270. PMID: 78295621; PMCID: [id number].) Consider skin biopsy if no improvement upon discharge.   Family/ staff Communication: children, spouse, nursing  Labs/tests ordered:  BMP, LFT, D-dimer, LLE doppler ultrasound   I spent greater than 60 minutes for the care of this patient in face to face time, chart review, clinical documentation, patient education, care coordination. I spent an additional 40 minutes discussing goals of care and advanced care planning.

## 2022-09-09 ENCOUNTER — Other Ambulatory Visit: Payer: Self-pay | Admitting: Student

## 2022-09-09 DIAGNOSIS — Z515 Encounter for palliative care: Secondary | ICD-10-CM | POA: Insufficient documentation

## 2022-09-09 DIAGNOSIS — K7469 Other cirrhosis of liver: Secondary | ICD-10-CM

## 2022-09-09 MED ORDER — HYDROMORPHONE HCL 2 MG PO TABS
2.0000 mg | ORAL_TABLET | Freq: Four times a day (QID) | ORAL | 0 refills | Status: DC | PRN
Start: 2022-09-09 — End: 2022-09-19

## 2022-09-09 NOTE — Progress Notes (Signed)
Readmission to [facility] Rx for controlled meds sent to in house pharmacy. Palliative care consultation ordered.

## 2022-09-12 ENCOUNTER — Non-Acute Institutional Stay (SKILLED_NURSING_FACILITY): Payer: Medicare Other | Admitting: Student

## 2022-09-12 ENCOUNTER — Encounter: Payer: Self-pay | Admitting: Student

## 2022-09-12 DIAGNOSIS — K7469 Other cirrhosis of liver: Secondary | ICD-10-CM

## 2022-09-12 DIAGNOSIS — I8510 Secondary esophageal varices without bleeding: Secondary | ICD-10-CM

## 2022-09-12 DIAGNOSIS — N184 Chronic kidney disease, stage 4 (severe): Secondary | ICD-10-CM

## 2022-09-12 DIAGNOSIS — I5032 Chronic diastolic (congestive) heart failure: Secondary | ICD-10-CM | POA: Diagnosis not present

## 2022-09-12 DIAGNOSIS — R5381 Other malaise: Secondary | ICD-10-CM

## 2022-09-12 DIAGNOSIS — E1142 Type 2 diabetes mellitus with diabetic polyneuropathy: Secondary | ICD-10-CM

## 2022-09-12 NOTE — Progress Notes (Signed)
Location:  [facility] Room Number: [place]: NO/101/A Place of Service:  SNF [phone]) Provider:  Earnestine Mealing, MD  Barbette Reichmann, MD  Patient Care Team: Barbette Reichmann, MD as PCP - General (Internal Medicine)  Extended Emergency Contact Information Primary Emergency Contact: Oltmann,Richard P Address: [address] [place], [place] [address] [place] Home Phone: [phone] Relation: Spouse Secondary Emergency Contact: Lucas,Kristy [place] Home Phone: [phone] Relation: Daughter  Code Status:  FULL/CPR Goals of care: Advanced Directive information    09/12/2022    9:57 AM  Advanced Directives  Does Patient Have a Medical Advance Directive? No  Would patient like information on creating a medical advance directive? No - Patient declined     Chief Complaint  Patient presents with   Acute Visit    Patient is here for follow up after hospital stay for admissions     HPI:  Pt is a 82 y.o. female seen today for an admission. She is here todaywith her son, Raiford Noble.   She says he took her to the hospital. She was hurting so much that she went to the hospital. She was hospitalized and had 3LVPs of 3L during the hospitalization.   She has had significant pain around the gallbladder. She had pain for 2 nights after going home that she had to go to the hospital. They did a lot of imaging while there. Gall stones present but no obstructing the duct. Removed abdominal fluid and no infection there. They have come to the consensus that it's fluid from liver failure that is pressing on the gallbladder irritated. The walls were thickened and inflammed bu tno infection or obstruction.  Plan to have a PleurX drain placed on Friday.   The pain medicaiton does help once it starts. The pain eases but it's not gone. Morpine causes nausea and itching. Not as much   Appetite has been fine. She did well through the weekend.    Daughter Neysa Bonito and Jonetta Speak Present. Completed MOST form with support of family.   She states when she dies she would like to be at home. Discussed concern that her choices and goals do not align at this time.   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   Sleep apnea    SOB (shortness of breath)    Stroke (HCC)    Temporal arteritis (HCC)    Thrombocytopenia (HCC)    Thrombocytopenia (HCC)    Past Surgical History:  Procedure Laterality Date   COLONOSCOPY  10/24/2013, 2002   hyperplastic polpys   CORONARY ANGIOPLASTY     CORONARY ARTERY BYPASS GRAFT  09/1998   ESOPHAGOGASTRODUODENOSCOPY (EGD) WITH PROPOFOL N/A 08/26/2016   Procedure: ESOPHAGOGASTRODUODENOSCOPY (EGD) WITH PROPOFOL;  Surgeon: Scot Jun, MD;  Location: [facility] ENDOSCOPY;  Service: Endoscopy;  Laterality: N/A;   ESOPHAGOGASTRODUODENOSCOPY (EGD) WITH PROPOFOL N/A 02/05/2018   Procedure: ESOPHAGOGASTRODUODENOSCOPY (EGD) WITH PROPOFOL;  Surgeon: Scot Jun, MD;  Location: [facility] ENDOSCOPY;  Service: Endoscopy;  Laterality: N/A;   ESOPHAGOGASTRODUODENOSCOPY (EGD) WITH PROPOFOL N/A 07/22/2019   Procedure: ESOPHAGOGASTRODUODENOSCOPY (EGD) WITH PROPOFOL;  Surgeon: Toledo, Boykin Nearing, MD;  Location: [facility] ENDOSCOPY;  Service: Gastroenterology;  Laterality: N/A;   ESOPHAGOGASTRODUODENOSCOPY ENDOSCOPY  05/24/2011   hemorroid surgery     JOINT REPLACEMENT  2003  2005   bil.knees    Allergies   Allergen Reactions   Tape Rash   Morphine And Codeine Nausea And Vomiting    Other reaction(s): Nausea And Vomiting   Other Rash    NEOPRENE, TAPE, BAND-AID TOUGH STRIPS    Outpatient Encounter Medications as of 09/12/2022  Medication Sig   acetaminophen (TYLENOL CHILDRENS PAIN + FEVER) 160 MG/5ML suspension Take 20.3 mLs by mouth every 8 (eight) hours as needed.   diclofenac Sodium (VOLTAREN) 1 % GEL Apply 2 g topically every 6 (six) hours as needed.   HYDROmorphone (DILAUDID) 2 MG tablet Take 1 tablet (2 mg total) by mouth every 6 (six) hours as needed for severe pain.   [employer] [facility] EX) Apply to buttocks/peri area topically every shift.   insulin lispro (HUMALOG) 100 UNIT/ML injection Inject per sliding scale: 70-150=o units, 151-200= 0 units, 201-250= 1 units, 251-300= 2 units, 301-350=3 units, 351-400= 4 units.   insulin lispro (HUMALOG) 100 UNIT/ML injection Inject 6 Units into the skin 3 (three) times daily before meals.   levothyroxine (SYNTHROID) 25 MCG tablet Take 25 mcg by mouth daily before breakfast.   melatonin 1 MG TABS tablet Take 1 mg by mouth at bedtime.   nystatin (MYCOSTATIN/NYSTOP) powder Apply 1 Application topically 3 (three) times daily.   potassium chloride SA (KLOR-CON M) 20 MEQ tablet Take 20 mEq by mouth daily.   rifaximin (XIFAXAN) 550 MG TABS tablet Take 550 mg by mouth 2 (two) times daily.    simvastatin (ZOCOR) 20 MG tablet Take 20 mg by mouth at bedtime.    spironolactone (ALDACTONE) 25 MG tablet Take 12.5 mg by mouth 2 (two) times daily.   torsemide (DEMADEX) 20 MG tablet Take 20 mg by mouth daily.   alendronate (FOSAMAX) 70 MG tablet Take 70 mg by mouth every Monday. Take with a full glass of water on an empty stomach.   dapagliflozin propanediol (FARXIGA) 10 MG TABS tablet Take 10 mg by mouth daily.   ferrous sulfate 325 (65 FE) MG tablet Take 325 mg by mouth daily. Monday,Wednesday and Friday.   No facility-administered encounter  medications on file as of 09/12/2022.    Review of Systems  Immunization History  Administered Date(s) Administered   Hep A / Hep B 09/11/2013, 10/21/2013, 03/25/2014   Influenza-Unspecified 11/26/2013, 11/27/2014, 12/30/2015, 12/24/2018, 01/13/2020, 12/23/2021   PFIZER Comirnaty(Gray Top)Covid-19 Tri-Sucrose Vaccine 04/03/2019, 04/24/2019, 12/26/2019   PFIZER(Purple Top)SARS-COV-2 Vaccination 04/03/2019, 04/24/2019   PPD Test 08/01/2022   Pfizer Covid-19 Vaccine Bivalent Booster 51yrs & up 01/12/2021   Pneumococcal Conjugate-13 04/13/2017   Pneumococcal Polysaccharide-23 12/30/2015   RSV,unspecified 06/08/2022   Zoster Recombinat (Shingrix) 06/12/2017   Pertinent  Health Maintenance Due  Topic Date Due   FOOT EXAM  Never done   OPHTHALMOLOGY EXAM  Never done   DEXA SCAN  Never done   HEMOGLOBIN A1C  12/07/2022      11/19/2020   11:00 PM 11/20/2020    8:00  AM 11/20/2020   10:00 PM 11/21/2020    7:05 AM 09/12/2022    9:57 AM  Fall Risk  Falls in the past year?     0  Was there an injury with Fall?     0  Fall Risk Category Calculator     0  (RETIRED) Patient Fall Risk Level High fall risk High fall risk High fall risk High fall risk   Patient at Risk for Falls Due to     No Fall Risks  Fall risk Follow up     Falls evaluation completed   Functional Status Survey:    Vitals:   09/12/22 0956  BP: 115/73  Pulse: 98  Resp: 16  Temp: 98.2 F (36.8 C)  SpO2: 91%  Weight: 168 lb 3.2 oz (76.3 kg)  Height: 5' (1.524 m)   Body mass index is 32.85 kg/m. Physical Exam Constitutional:      Appearance: She is obese.  Cardiovascular:     Rate and Rhythm: Normal rate and regular rhythm.  Pulmonary:     Effort: Pulmonary effort is normal.     Breath sounds: Normal breath sounds.  Abdominal:     Comments: Distended soft  Musculoskeletal:     Comments: Trace edema bilaterally.    Neurological:     Mental Status: She is alert and oriented to person, place, and time.      Labs reviewed: Recent Labs    08/04/22 0000 08/11/22 0000 08/18/22 0000  NA 133* 131* 133*  K 3.1* 4.0 4.0  CL 102 101 98*  CO2 19 19 23*  BUN 44* 28* 29*  CREATININE 1.8* 1.7* 1.9*  CALCIUM 6.4* 5.1* 6.5*   Recent Labs    06/06/22 0000  AST 52*  ALT 60*  ALKPHOS 122  ALBUMIN 3.4*   Recent Labs    06/06/22 0000 08/04/22 0000  WBC 4.2 6.7  NEUTROABS 3,049.00 5,306.00  HGB 10.2* 10.1*  HCT 30* 30*  PLT 49* 67*   Lab Results  Component Value Date   TSH 2.319 11/20/2020   Lab Results  Component Value Date   HGBA1C 6.3 06/06/2022   Lab Results  Component Value Date   CHOL 195 04/23/2016   HDL 43 04/23/2016   LDLCALC 138 (H) 04/23/2016   TRIG 70 04/23/2016   CHOLHDL 4.5 04/23/2016    Significant Diagnostic Results in last 30 days:  No results found.  Assessment/Plan Cirrhosis, cryptogenic (HCC) - Plan: Ambulatory referral to Hospice  Chronic heart failure with preserved ejection fraction (HCC) - Plan: Ambulatory referral to Hospice  Obesity, morbid (HCC), Chronic  Other cirrhosis of liver (HCC)  Secondary esophageal varices without bleeding (HCC)  Hypocalcemia  CKD (chronic kidney disease), stage IV (HCC)  Type 2 diabetes mellitus with peripheral neuropathy [facility])  Physical deconditioning Patient was readmitted to [facility] for increased pain found to have significant ascites. Received LVPx3 during hospitalization. Decision was made to go to skilled nursing and return home with hospice. At this time she is receiving therapy for a safe return home. Received messaging from hospice they cannot admit until she has been released from Skilled nursing (can occur while in the facility, but not simultaneous with skilled). Patient has increased pain due to large volume in abdomen. Scheduled to receive a palliative pleurx on Friday. Continue spironolactone 25 mg BID and Torsemide 20 mg daily. Continue Dilaudid 2 mg q6 hours prn for pain. Primary goal at this  time is comfort. MOST FORM completed-- despite concern  for poor outcomes with CPR, patient will remain full code. She is okay returning to the hospital with limited interventions. No feeding tube. Okay with IVF and desires life-prolonging antibiotics. Continue providing supportive care.   Family/ staff Communication: daughter, son, nursing  Labs/tests ordered:  none  I spent greater than 45  minutes for the care of this patient in face to face time, chart review, clinical documentation, patient education. I spent an additional 16 minutes discussing goals of care and advanced care planning.

## 2022-09-17 ENCOUNTER — Telehealth: Payer: Self-pay | Admitting: Student

## 2022-09-17 ENCOUNTER — Telehealth: Payer: Self-pay | Admitting: Adult Health

## 2022-09-17 NOTE — Telephone Encounter (Signed)
Received call from nursing home that patient had pleurx placed yesterday. Would like recommendations regarding management. Drain pleurx every other day for symptom control. If less than can space to q3d.

## 2022-09-17 NOTE — Telephone Encounter (Signed)
Received phone call that patient was having abd pain after working with therapy. She received dilaudid and tylenol and but still has abd pain. She had a drain placed on 6/20 due to cirrhosis with ascites. The nurse was not aware of orders documented in epic regarding draining every other day. Also a hospice consult has been written but the nurse was not aware of this order or if there had been a hospice eval completed yet. The nurse reported that her abd was "tight" but its unclear if this is new compared to baseline. I recommend the drain be utilized to alleviate any pressure. And if symptoms persist we can increase the dilaudid dosage. I also let them know to f/u regarding the hospice consult for further management of this issue.

## 2022-09-18 ENCOUNTER — Observation Stay
Admission: EM | Admit: 2022-09-18 | Discharge: 2022-09-19 | Disposition: A | Discharge status: Skilled Nursing Facility | Payer: Medicare Other | Attending: Obstetrics and Gynecology | Admitting: Obstetrics and Gynecology

## 2022-09-18 ENCOUNTER — Telehealth: Payer: Self-pay | Admitting: Orthopedic Surgery

## 2022-09-18 ENCOUNTER — Emergency Department: Payer: Medicare Other

## 2022-09-18 ENCOUNTER — Other Ambulatory Visit: Payer: Self-pay

## 2022-09-18 DIAGNOSIS — K659 Peritonitis, unspecified: Principal | ICD-10-CM | POA: Insufficient documentation

## 2022-09-18 DIAGNOSIS — I4892 Unspecified atrial flutter: Secondary | ICD-10-CM | POA: Diagnosis not present

## 2022-09-18 DIAGNOSIS — K7469 Other cirrhosis of liver: Secondary | ICD-10-CM

## 2022-09-18 DIAGNOSIS — R109 Unspecified abdominal pain: Secondary | ICD-10-CM | POA: Diagnosis present

## 2022-09-18 DIAGNOSIS — I8500 Esophageal varices without bleeding: Secondary | ICD-10-CM | POA: Diagnosis not present

## 2022-09-18 DIAGNOSIS — I25810 Atherosclerosis of coronary artery bypass graft(s) without angina pectoris: Secondary | ICD-10-CM | POA: Diagnosis present

## 2022-09-18 DIAGNOSIS — I639 Cerebral infarction, unspecified: Secondary | ICD-10-CM | POA: Diagnosis present

## 2022-09-18 DIAGNOSIS — I509 Heart failure, unspecified: Secondary | ICD-10-CM | POA: Diagnosis not present

## 2022-09-18 DIAGNOSIS — I2510 Atherosclerotic heart disease of native coronary artery without angina pectoris: Secondary | ICD-10-CM | POA: Diagnosis not present

## 2022-09-18 DIAGNOSIS — N183 Chronic kidney disease, stage 3 unspecified: Secondary | ICD-10-CM | POA: Insufficient documentation

## 2022-09-18 DIAGNOSIS — I5030 Unspecified diastolic (congestive) heart failure: Secondary | ICD-10-CM | POA: Diagnosis present

## 2022-09-18 DIAGNOSIS — K652 Spontaneous bacterial peritonitis: Principal | ICD-10-CM

## 2022-09-18 DIAGNOSIS — I10 Essential (primary) hypertension: Secondary | ICD-10-CM | POA: Diagnosis not present

## 2022-09-18 DIAGNOSIS — K7460 Unspecified cirrhosis of liver: Secondary | ICD-10-CM | POA: Insufficient documentation

## 2022-09-18 DIAGNOSIS — Z515 Encounter for palliative care: Secondary | ICD-10-CM

## 2022-09-18 DIAGNOSIS — E1142 Type 2 diabetes mellitus with diabetic polyneuropathy: Secondary | ICD-10-CM | POA: Insufficient documentation

## 2022-09-18 LAB — BODY FLUID CELL COUNT WITH DIFFERENTIAL
Eos, Fluid: 0 %
Lymphs, Fluid: 20 %
Monocyte-Macrophage-Serous Fluid: 62 %
Neutrophil Count, Fluid: 18 %
Total Nucleated Cell Count, Fluid: 770 cu mm

## 2022-09-18 LAB — COMPREHENSIVE METABOLIC PANEL
ALT: 30 U/L (ref 0–44)
AST: 68 U/L — ABNORMAL HIGH (ref 15–41)
Albumin: 3.3 g/dL — ABNORMAL LOW (ref 3.5–5.0)
Alkaline Phosphatase: 107 U/L (ref 38–126)
Anion gap: 12 (ref 5–15)
BUN: 38 mg/dL — ABNORMAL HIGH (ref 8–23)
CO2: 22 mmol/L (ref 22–32)
Calcium: 8 mg/dL — ABNORMAL LOW (ref 8.9–10.3)
Chloride: 96 mmol/L — ABNORMAL LOW (ref 98–111)
Creatinine, Ser: 1.6 mg/dL — ABNORMAL HIGH (ref 0.44–1.00)
GFR, Estimated: 32 mL/min — ABNORMAL LOW (ref >60)
Glucose, Bld: 112 mg/dL — ABNORMAL HIGH (ref 70–99)
Potassium: 4.5 mmol/L (ref 3.5–5.1)
Sodium: 130 mmol/L — ABNORMAL LOW (ref 135–145)
Total Bilirubin: 5.4 mg/dL — ABNORMAL HIGH (ref 0.3–1.2)
Total Protein: 7.5 g/dL (ref 6.5–8.1)

## 2022-09-18 LAB — CBC
HCT: 26.4 % — ABNORMAL LOW (ref 36.0–46.0)
Hemoglobin: 8.5 g/dL — ABNORMAL LOW (ref 12.0–15.0)
MCH: 30.4 pg (ref 26.0–34.0)
MCHC: 32.2 g/dL (ref 30.0–36.0)
MCV: 94.3 fL (ref 80.0–100.0)
Platelets: 68 10*3/uL — ABNORMAL LOW (ref 150–400)
RBC: 2.8 MIL/uL — ABNORMAL LOW (ref 3.87–5.11)
RDW: 17.7 % — ABNORMAL HIGH (ref 11.5–15.5)
WBC: 4.3 10*3/uL (ref 4.0–10.5)
nRBC: 0 % (ref 0.0–0.2)

## 2022-09-18 LAB — FOLATE: Folate: 15.4 ng/mL (ref >5.9)

## 2022-09-18 LAB — CBG MONITORING, ED
Glucose-Capillary: 79 mg/dL (ref 70–99)
Glucose-Capillary: 135 mg/dL — ABNORMAL HIGH (ref 70–99)

## 2022-09-18 LAB — RETICULOCYTES
Immature Retic Fract: 3.2 % (ref 2.3–15.9)
RBC.: 4.6 MIL/uL (ref 3.87–5.11)
Retic Count, Absolute: 30.8 10*3/uL (ref 19.0–186.0)
Retic Ct Pct: 0.7 % (ref 0.4–3.1)

## 2022-09-18 LAB — IRON AND TIBC
Iron: 33 ug/dL (ref 28–170)
Saturation Ratios: 10 % — ABNORMAL LOW (ref 10.4–31.8)
TIBC: 319 ug/dL (ref 250–450)
UIBC: 286 ug/dL

## 2022-09-18 LAB — FERRITIN: Ferritin: 462 ng/mL — ABNORMAL HIGH (ref 11–307)

## 2022-09-18 LAB — HEMOGLOBIN: Hemoglobin: 8.2 g/dL — ABNORMAL LOW (ref 12.0–15.0)

## 2022-09-18 MED ORDER — SODIUM CHLORIDE 0.9 % IV SOLN
2.0000 g | Freq: Once | INTRAVENOUS | Status: AC
  Administered 2022-09-18: 2 g via INTRAVENOUS
  Filled 2022-09-18: qty 20

## 2022-09-18 MED ORDER — HYDROMORPHONE HCL 2 MG PO TABS: 2.0000 mg | ORAL_TABLET | Freq: Four times a day (QID) | ORAL | Status: DC | PRN

## 2022-09-18 MED ORDER — INSULIN ASPART 100 UNIT/ML IJ SOLN: 0.0000 [IU] | Freq: Three times a day (TID) | INTRAMUSCULAR | Status: DC

## 2022-09-18 MED ORDER — ONDANSETRON HCL 4 MG PO TABS: 4.0000 mg | ORAL_TABLET | Freq: Four times a day (QID) | ORAL | Status: DC | PRN

## 2022-09-18 MED ORDER — ONDANSETRON HCL 4 MG/2ML IJ SOLN
4.0000 mg | Freq: Once | INTRAMUSCULAR | Status: AC
  Administered 2022-09-18: 4 mg via INTRAVENOUS

## 2022-09-18 MED ORDER — HYDROMORPHONE HCL 1 MG/ML IJ SOLN
1.0000 mg | Freq: Once | INTRAMUSCULAR | Status: AC
  Administered 2022-09-18: 1 mg via INTRAVENOUS
  Filled 2022-09-18: qty 1

## 2022-09-18 MED ORDER — DIPHENHYDRAMINE HCL 50 MG/ML IJ SOLN
12.5000 mg | Freq: Once | INTRAMUSCULAR | Status: AC
  Administered 2022-09-18: 12.5 mg via INTRAVENOUS
  Filled 2022-09-18: qty 1

## 2022-09-18 MED ORDER — ALBUTEROL SULFATE (2.5 MG/3ML) 0.083% IN NEBU: 2.5000 mg | INHALATION_SOLUTION | RESPIRATORY_TRACT | Status: DC | PRN

## 2022-09-18 MED ORDER — HYDROMORPHONE HCL 1 MG/ML IJ SOLN: 1.0000 mg | INTRAMUSCULAR | Status: DC | PRN

## 2022-09-18 MED ORDER — SPIRONOLACTONE 12.5 MG HALF TABLET
12.5000 mg | ORAL_TABLET | Freq: Two times a day (BID) | ORAL | Status: DC
  Administered 2022-09-18 – 2022-09-19 (×2): 12.5 mg via ORAL
  Filled 2022-09-18 (×2): qty 1

## 2022-09-18 MED ORDER — LEVOTHYROXINE SODIUM 25 MCG PO TABS
25.0000 ug | ORAL_TABLET | Freq: Every day | ORAL | Status: DC
  Administered 2022-09-19: 25 ug via ORAL
  Filled 2022-09-18: qty 1

## 2022-09-18 MED ORDER — HYDROMORPHONE HCL 1 MG/ML IJ SOLN
1.0000 mg | INTRAMUSCULAR | Status: DC | PRN
  Administered 2022-09-19 (×2): 1 mg via INTRAVENOUS
  Filled 2022-09-18 (×2): qty 1

## 2022-09-18 MED ORDER — TORSEMIDE 20 MG PO TABS
20.0000 mg | ORAL_TABLET | Freq: Every day | ORAL | Status: DC
  Administered 2022-09-19: 20 mg via ORAL
  Filled 2022-09-18: qty 1

## 2022-09-18 MED ORDER — DIPHENHYDRAMINE HCL 50 MG/ML IJ SOLN
12.5000 mg | Freq: Four times a day (QID) | INTRAMUSCULAR | Status: DC | PRN
  Administered 2022-09-18 – 2022-09-19 (×2): 12.5 mg via INTRAVENOUS
  Filled 2022-09-18 (×2): qty 1

## 2022-09-18 MED ORDER — DAPAGLIFLOZIN PROPANEDIOL 10 MG PO TABS: 10.0000 mg | ORAL_TABLET | Freq: Every day | ORAL | Status: DC

## 2022-09-18 MED ORDER — SODIUM CHLORIDE 0.9 % IV SOLN: 2.0000 g | INTRAVENOUS | Status: DC

## 2022-09-18 MED ORDER — RIFAXIMIN 550 MG PO TABS
550.0000 mg | ORAL_TABLET | Freq: Two times a day (BID) | ORAL | Status: DC
  Administered 2022-09-18 – 2022-09-19 (×2): 550 mg via ORAL
  Filled 2022-09-18 (×2): qty 1

## 2022-09-18 MED ORDER — ONDANSETRON HCL 4 MG/2ML IJ SOLN: 4.0000 mg | Freq: Four times a day (QID) | INTRAMUSCULAR | Status: DC | PRN

## 2022-09-18 NOTE — H&P (Addendum)
History and Physical    Theresa [name] UXL:[id number] DOB: 1940-12-09 DOA: 09/18/2022  PCP: Barbette Reichmann, MD  Patient coming from: [facility]   I have personally briefly reviewed patient's old medical records in [facility]  Chief Complaint: abdominal pain  HPI: Theresa [name] is a 82 y.o. 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  Per family who gives history -abdominal pain has increased over the last 48 hours. Per family Pleurx cath has been functional and last drain was yesterday in the amount of 1.3 L. Patient throughout this time has used oral dilaudid that was prescribed for chronic abdominal pain  related to ascites without improvement.  Of note patient denies any associated symptoms of vomiting/ decrease oral intake , dysuria, fever/chills/ cough /sob or chest pain. Family has noted over the last few days patient has had improvement in her activity and cognitive status inspite of pain issues.  However, due to progression of pain they brought patient in for evaluation.   ED Course:  Vitals: afeb, bp 109/61,-99/48 hr 103 , rr 20  sat 100%  NA : 130 (131-130), K 4.5, cl 96, glu 112, cr 1.6 ( 1.9) Ast 68 ( 52)  Wbc: 4.3, hgb 8.5 ( 10.1), plt 68 ( 67) CTAB IMPRESSION: 1. Cirrhotic liver with sequela of portal hypertension including splenomegaly and small volume abdominopelvic ascites. 2. Cholelithiasis. 3. Extensive colonic diverticulosis without evidence of acute diverticulitis. 4. Focal area of nodular thickening along  the anterior wall of the urinary bladder measuring approximately 1.6 x 0.8 cm. This could reflect a small bladder mass. Further evaluation with cystoscopy is recommended. 5. Trace bilateral pleural effusions with right basilar atelectasis or consolidation.  Peritoneal fluid:  yellow cell count 770  with 18% pmn  ( less than 250 )   Tx  Zofran , benadryl ,ctx Review of Systems: As per HPI otherwise 10 point review of systems negative.   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   Sleep apnea    SOB (shortness of breath)    Stroke (HCC)    Temporal arteritis (HCC)    Thrombocytopenia (HCC)    Thrombocytopenia (HCC)     Past Surgical History:  Procedure Laterality Date   COLONOSCOPY  10/24/2013, 2002   hyperplastic polpys   CORONARY ANGIOPLASTY     CORONARY ARTERY BYPASS GRAFT  09/1998   ESOPHAGOGASTRODUODENOSCOPY (EGD) WITH PROPOFOL N/A 08/26/2016  Procedure: ESOPHAGOGASTRODUODENOSCOPY (EGD) WITH PROPOFOL;  Surgeon: Scot Jun, MD;  Location: [facility] ENDOSCOPY;  Service: Endoscopy;  Laterality: N/A;   ESOPHAGOGASTRODUODENOSCOPY (EGD) WITH PROPOFOL N/A 02/05/2018   Procedure: ESOPHAGOGASTRODUODENOSCOPY (EGD) WITH PROPOFOL;  Surgeon: Scot Jun, MD;  Location: [facility] ENDOSCOPY;  Service: Endoscopy;  Laterality: N/A;   ESOPHAGOGASTRODUODENOSCOPY (EGD) WITH PROPOFOL N/A 07/22/2019   Procedure: ESOPHAGOGASTRODUODENOSCOPY (EGD) WITH PROPOFOL;  Surgeon:  Toledo, Boykin Nearing, MD;  Location: [facility] ENDOSCOPY;  Service: Gastroenterology;  Laterality: N/A;   ESOPHAGOGASTRODUODENOSCOPY ENDOSCOPY  05/24/2011   hemorroid surgery     JOINT REPLACEMENT  2003  2005   [email or web address]     reports that she has never smoked. She has never used smokeless tobacco. She reports that she does not drink alcohol and does not use drugs.  Allergies  Allergen Reactions   Tape Rash   Morphine And Codeine Nausea And Vomiting    Other reaction(s): Nausea And Vomiting   Other Rash    NEOPRENE, TAPE, BAND-AID TOUGH STRIPS    Family History  Problem Relation Age of Onset   Heart disease Other    Hypertension Other    Anemia Other    Colon cancer Other    CVA Mother    Dementia Mother    CAD Father    Breast cancer Neg Hx     Prior to Admission medications   Medication Sig Start Date End Date Taking? Authorizing Provider  acetaminophen (TYLENOL CHILDRENS PAIN + FEVER) 160 MG/5ML suspension Take 20.3 mLs by mouth every 8 (eight) hours as needed.    [provider]  alendronate (FOSAMAX) 70 MG tablet Take 70 mg by mouth every Monday. Take with a full glass of water on an empty stomach.    [provider]  dapagliflozin propanediol (FARXIGA) 10 MG TABS tablet Take 10 mg by mouth daily.    [provider]  diclofenac Sodium (VOLTAREN) 1 % GEL Apply 2 g topically every 6 (six) hours as needed.    [provider]  ferrous sulfate 325 (65 FE) MG tablet Take 325 mg by mouth daily. Monday,Wednesday and Friday.    [provider]  HYDROmorphone (DILAUDID) 2 MG tablet Take 1 tablet (2 mg total) by mouth every 6 (six) hours as needed for severe pain. 09/09/22   Earnestine Mealing, MD  [employer] [facility] EX) Apply to buttocks/peri area topically every shift.    [provider]  insulin lispro (HUMALOG) 100 UNIT/ML injection Inject per sliding scale: 70-150=o units, 151-200= 0 units, 201-250= 1 units, 251-300= 2  units, 301-350=3 units, 351-400= 4 units.    [provider]  insulin lispro (HUMALOG) 100 UNIT/ML injection Inject 6 Units into the skin 3 (three) times daily before meals.    [provider]  levothyroxine (SYNTHROID) 25 MCG tablet Take 25 mcg by mouth daily before breakfast. 03/31/15   [provider]  melatonin 1 MG TABS tablet Take 1 mg by mouth at bedtime.    [provider]  nystatin (MYCOSTATIN/NYSTOP) powder Apply 1 Application topically 3 (three) times daily.    [provider]  potassium chloride SA (KLOR-CON M) 20 MEQ tablet Take 20 mEq by mouth daily.    [provider]  rifaximin (XIFAXAN) 550 MG TABS tablet Take 550 mg by mouth 2 (two) times daily.  01/20/15   [provider]  simvastatin (ZOCOR) 20 MG tablet Take 20 mg by mouth at bedtime.  03/24/15   [provider]  spironolactone (ALDACTONE)  25 MG tablet Take 12.5 mg by mouth 2 (two) times daily. 11/13/20   [provider]  torsemide (DEMADEX) 20 MG tablet Take 20 mg by mouth daily.    [provider]    Physical Exam: Vitals:   09/18/22 0828 09/18/22 0829 09/18/22 0930  BP: 109/61  (!) 125/50  Pulse: (!) 103  95  Resp: 20    Temp: 98 F (36.7 C)    SpO2: 100%    Weight:  76 kg   Height:  5' (1.524 m)     Constitutional: NAD, calm, in pain/grimacing  Vitals:   09/18/22 0828 09/18/22 0829 09/18/22 0930  BP: 109/61  (!) 125/50  Pulse: (!) 103  95  Resp: 20    Temp: 98 F (36.7 C)    SpO2: 100%    Weight:  76 kg   Height:  5' (1.524 m)    Eyes: PERRL, lids and conjunctivae normal ENMT: Mucous membranes are dry, Posterior pharynx clear of any exudate or lesions.Normal dentition.  Neck: normal, supple, no masses, no thyromegaly Respiratory: clear to auscultation bilaterally, no wheezing, no crackles. Normal respiratory effort. No accessory muscle use.  Cardiovascular: Regular rate and rhythm, no murmurs / rubs / gallops. +  extremity edema. 2+ pedal pulses. No carotid bruits.  Abdomen: + left lower/right lower quad tenderness, no masses palpated. No hepatosplenomegaly. Bowel sounds positive.  Musculoskeletal: no clubbing / cyanosis. No joint deformity upper and lower extremities. Good ROM, no contractures. Normal muscle tone.  Skin: no rashes, lesions, ulcers. No induration,chronic venostasis change b/l lower extremity  Neurologic: CN 2-12 grossly intact. Sensation intact,  Strength 5/5 in all 4.  Psychiatric: Alert, not able to fully assess due to pain and recent medication administration   Labs on Admission: I have personally reviewed following labs and imaging studies  CBC: Recent Labs  Lab 09/18/22 0900  WBC 4.3  HGB 8.5*  HCT 26.4*  MCV 94.3  PLT 68*   Basic Metabolic Panel: Recent Labs  Lab 09/18/22 0900  NA 130*  K 4.5  CL 96*  CO2 22  GLUCOSE 112*  BUN 38*  CREATININE 1.60*  CALCIUM 8.0*   GFR: Estimated Creatinine Clearance: 23.8 mL/min (A) (by C-G formula based on SCr of 1.6 mg/dL (H)). Liver Function Tests: Recent Labs  Lab 09/18/22 0900  AST 68*  ALT 30  ALKPHOS 107  BILITOT 5.4*  PROT 7.5  ALBUMIN 3.3*   No results for input(s): "LIPASE", "AMYLASE" in the last 168 hours. No results for input(s): "AMMONIA" in the last 168 hours. Coagulation Profile: No results for input(s): "INR", "PROTIME" in the last 168 hours. Cardiac Enzymes: No results for input(s): "CKTOTAL", "CKMB", "CKMBINDEX", "TROPONINI" in the last 168 hours. BNP (last 3 results) No results for input(s): "PROBNP" in the last 8760 hours. HbA1C: No results for input(s): "HGBA1C" in the last 72 hours. CBG: No results for input(s): "GLUCAP" in the last 168 hours. Lipid Profile: No results for input(s): "CHOL", "HDL", "LDLCALC", "TRIG", "CHOLHDL", "LDLDIRECT" in the last 72 hours. Thyroid Function Tests: No results for input(s): "TSH", "T4TOTAL", "FREET4", "T3FREE", "THYROIDAB" in the last 72 hours. Anemia  Panel: No results for input(s): "VITAMINB12", "FOLATE", "FERRITIN", "TIBC", "IRON", "RETICCTPCT" in the last 72 hours. Urine analysis:    Component Value Date/Time   COLORURINE STRAW (A) 11/19/2020 1401   APPEARANCEUR CLEAR (A) 11/19/2020 1401   APPEARANCEUR Hazy 01/29/2014 2131   LABSPEC 1.006 11/19/2020 1401   LABSPEC 1.005 01/29/2014 2131   PHURINE  7.0 11/19/2020 1401   GLUCOSEU >=500 (A) 11/19/2020 1401   GLUCOSEU Negative 01/29/2014 2131   HGBUR MODERATE (A) 11/19/2020 1401   BILIRUBINUR NEGATIVE 11/19/2020 1401   BILIRUBINUR Negative 01/29/2014 2131   KETONESUR NEGATIVE 11/19/2020 1401   PROTEINUR NEGATIVE 11/19/2020 1401   NITRITE NEGATIVE 11/19/2020 1401   LEUKOCYTESUR NEGATIVE 11/19/2020 1401   LEUKOCYTESUR 3+ 01/29/2014 2131    Radiological Exams on Admission: CT ABDOMEN PELVIS WO CONTRAST  Result Date: 09/18/2022 CLINICAL DATA:  Right lower quadrant abdominal pain. Recent peritoneal drain catheter placement 2 days ago EXAM: CT ABDOMEN AND PELVIS WITHOUT CONTRAST TECHNIQUE: Multidetector CT imaging of the abdomen and pelvis was performed following the standard protocol without IV contrast. RADIATION DOSE REDUCTION: This exam was performed according to the departmental dose-optimization program which includes automated exposure control, adjustment of the mA and/or kV according to patient size and/or use of iterative reconstruction technique. COMPARISON:  CT 05/12/2016 FINDINGS: Lower chest: Trace bilateral pleural effusions. Right basilar atelectasis or consolidation. Hepatobiliary: Shrunken, nodular appearance of the liver compatible with cirrhosis. Evaluation of the hepatic parenchyma is limited in the absence of intravenous contrast. Multiple layering stones within the gallbladder. Pancreas: Unremarkable. No pancreatic ductal dilatation or surrounding inflammatory changes. Spleen: Splenomegaly. Adrenals/Urinary Tract: Unremarkable adrenal glands. Both kidneys are within normal  limits. No renal stone or hydronephrosis. There is a focal area of nodular thickening along the anterior wall of the urinary bladder measuring approximately 1.6 x 0.8 cm (series 2, image 58). Stomach/Bowel: Stomach is within normal limits. Appendix appears normal. Extensive colonic diverticulosis. No evidence of bowel wall thickening, distention, or inflammatory changes. Vascular/Lymphatic: Aortic atherosclerosis. No enlarged abdominal or pelvic lymph nodes. Reproductive: Uterus and bilateral adnexa are unremarkable. Other: Small volume abdominopelvic ascites. Diffuse mesenteric edema. Ascitic fluid within an umbilical hernia. There is a small bore peritoneal drainage catheter position within the pelvis. No free intraperitoneal air. Musculoskeletal: Multilevel lumbar spondylosis. No acute bony abnormality. Generalized anasarca. IMPRESSION: 1. Cirrhotic liver with sequela of portal hypertension including splenomegaly and small volume abdominopelvic ascites. 2. Cholelithiasis. 3. Extensive colonic diverticulosis without evidence of acute diverticulitis. 4. Focal area of nodular thickening along the anterior wall of the urinary bladder measuring approximately 1.6 x 0.8 cm. This could reflect a small bladder mass. Further evaluation with cystoscopy is recommended. 5. Trace bilateral pleural effusions with right basilar atelectasis or consolidation. 6. Aortic atherosclerosis (ICD10-I70.0). Electronically Signed   By: Duanne Guess D.O.   On: 09/18/2022 10:13    EKG: Independently reviewed.   Assessment/Plan   Possible Bacterial peritonitis  -hx of symptomatic ascites s/p peritoneal plurex cath 6/21  -now with diffuse abdominal pain  -peritoneal fluid noting >750 nucleated cells/ 18% pmn < 250 -await ascitic fluid culture  - with continue with CTX 2 gram while awaiting cultures ,de-escalate as able - of note CTAB no new acute finding  - drain pluerx  q3d / prn increase ascites with increase symptoms     Cirrhosis  -2/2 MASLD (Metabolic dysfunction -associated steototic liver disease) -c/b Hepatic encephalopathy -c/b varices  -c/b symptomatic ascites -followed by [facility]  Thrombocytopenia -plt 68 stable  -continue to monitor   Anemia  -hgb 8.5 down drom 10.1  - will check anemia labs  - family notes no hx of dark stools  -monitor H/H  -transfuse if 8 or less   Chronic hyponatremia  -in setting of cirrhosis -continue to monitor currently close to baseline   HTN -hold bp medications due to relative hypotension  HFpEF -appear compensated  -continue to monitor I/o  -continue spironolactone / torsemide as able   CAD s/p CABG LIMA to LAD 2000  -no active cardiac complaints   Hx of subudural hematoma  -10/2020 c/b residual R sided deficits -no active issues   HLD -resume home statin   Paroxysmal Atrial fibrillation  -Not on AC due to GIB, anemia, thrombocytopenia.  -Previously on BB but since discontinued due to bradycardia   DMII -iss/fs -last A1c   Hypothyroidism  - resume levothyroxine   OSA - cpap as able   DVT prophylaxis: scd Code Status: full/ as discussed per patient wishes in event of cardiac arrest  Family Communication: Raiford Noble and Theresa [name] son [phone]) Disposition Plan:patient  expected to be admitted greater than 2 midnights  Consults called: n/a Admission status: med tele   Lurline Del MD [facility]   If 7PM-7AM, please contact night-coverage [email or web address] Password [facility]  09/18/2022, 1:54 PM

## 2022-09-18 NOTE — Telephone Encounter (Signed)
[facility] nursing calls to report family has taken her to hospital due to uncontrolled pain.

## 2022-09-18 NOTE — ED Triage Notes (Signed)
Pt to ED with family for post op pain started yesterday afternoon. Reports had peritoneal drain placed on Friday at [facility] Skin tear noted to left ankle Pt constantly moaning, not answer questions. Pt from [facility]

## 2022-09-18 NOTE — ED Provider Notes (Signed)
[facility] Provider Note    Event Date/Time   First MD Initiated Contact with Patient 09/18/22 [phone]     (approximate)  History   Chief Complaint: Post-op Problem  HPI  Theresa [name] is a 82 y.o. female with a past medical history of anemia, CHF, cirrhosis, diabetes, hypertension, hyperlipidemia, CVA, presents to the emergency department for abdominal pain.  According to family patient has cirrhosis with recurrent ascites requiring frequent paracentesis, Friday (2 days ago) she had a peritoneal drain placed at [facility].  States since that placement she has been complaining of increased abdominal pain mostly in the right lower quadrant in the area of her drain placement.  They did remove 1.5 L of fluid at the time of placement per son.  They have also noticed increase lower extremity edema compared to baseline.  No known fever.  Physical Exam   Triage Vital Signs: ED Triage Vitals  Enc Vitals Group     BP 09/18/22 0828 109/61     Pulse Rate 09/18/22 0828 (!) 103     Resp 09/18/22 0828 20     Temp 09/18/22 0828 98 F (36.7 C)     Temp src --      SpO2 09/18/22 0828 100 %     Weight 09/18/22 0829 167 lb 8.8 oz (76 kg)     Height 09/18/22 0829 5' (1.524 m)     Head Circumference --      Peak Flow --      Pain Score --      Pain Loc --      Pain Edu? --      Excl. in GC? --     Most recent vital signs: Vitals:   09/18/22 0828  BP: 109/61  Pulse: (!) 103  Resp: 20  Temp: 98 F (36.7 C)  SpO2: 100%    General: Awake, moaning, when asked where she hurts she points to the right lower quadrant of her abdomen. CV:  Good peripheral perfusion.  Regular rate and rhythm around 100 bpm. Resp:  Normal effort.  Equal breath sounds bilaterally.  Abd:  No distention.  Soft, moderate right lower quadrant tenderness in the area of her peritoneal drain.  ED Results / Procedures / Treatments   RADIOLOGY  I have reviewed and interpreted the CT images.  Peritoneal  drain appears to be within the peritoneal cavity.  I do not see any obvious abscess on my evaluation. Radiology has read the CT scan is consistent with cirrhotic liver and portal hypertension focal area of bladder wall thickening which I have conveyed to the family and recommended follow-up for cystoscopy.   MEDICATIONS ORDERED IN ED: Medications  HYDROmorphone (DILAUDID) injection 1 mg (has no administration in time range)  ondansetron (ZOFRAN) injection 4 mg (has no administration in time range)     IMPRESSION / MDM / ASSESSMENT AND PLAN / ED COURSE  I reviewed the triage vital signs and the nursing notes.  Patient's presentation is most consistent with acute presentation with potential threat to life or bodily function.  Patient presents to the emergency department for right lower quadrant abdominal pain worse over the past 2 days since having a peritoneal drain placed in the right lower quadrant.  Does have moderate tenderness without rebound or guarding, afebrile in the emergency department.  Patient does take chronic pain medication but is currently complaining of pain in the emergency department.  Will dose Dilaudid for pain control, Zofran for any  associated nausea.  Will check labs, obtain a peritoneal fluid sample as well as a CT scan of the abdomen/pelvis.  Differentials quite broad but would include postoperative infection/SBP, malpositioned drain, early for postoperative abscess, other intra-abdominal pathology such as diverticulitis, colitis, appendicitis.  CT does not appear to show any significant finding that explain the patient's abdominal pain.  Reassuringly CBC shows normal white blood cell count does show chronic anemia.  Chemistry shows renal insufficiency with creatinine 1.6 unable to use IV contrast for the CT study had to use a noncontrasted CT scan.  No other significant findings on chemistry.  We have contacted multiple departments within the hospital nobody has an  adapter to fit the patient's peritoneal drain.  Family will be returning to [facility] nursing facility to obtain the catheter drain adapter so that we can test a sample of ascitic fluid.  Ascitic fluid shows 770 white blood cells.  Given the patient's abdominal pain with elevated white blood cells in the ascitic fluid we will start the patient on IV Rocephin and admit to the hospital service until the patient's peritoneal fluid culture results.  Family agreeable to plan of care.  Patient is complaining of being quite itchy we will dose 12.5 mg of Benadryl.  FINAL CLINICAL IMPRESSION(S) / ED DIAGNOSES   Abdominal pain Spontaneous bacterial peritonitis  Note:  This document was prepared using Dragon voice recognition software and may include unintentional dictation errors.   Minna Antis, MD 09/18/22 1322

## 2022-09-19 ENCOUNTER — Non-Acute Institutional Stay (SKILLED_NURSING_FACILITY): Payer: Medicare Other | Admitting: Student

## 2022-09-19 ENCOUNTER — Encounter: Payer: Self-pay | Admitting: Student

## 2022-09-19 DIAGNOSIS — I5032 Chronic diastolic (congestive) heart failure: Secondary | ICD-10-CM

## 2022-09-19 DIAGNOSIS — I8510 Secondary esophageal varices without bleeding: Secondary | ICD-10-CM

## 2022-09-19 DIAGNOSIS — Z515 Encounter for palliative care: Secondary | ICD-10-CM

## 2022-09-19 DIAGNOSIS — K7469 Other cirrhosis of liver: Secondary | ICD-10-CM | POA: Diagnosis not present

## 2022-09-19 DIAGNOSIS — I4892 Unspecified atrial flutter: Secondary | ICD-10-CM

## 2022-09-19 DIAGNOSIS — N184 Chronic kidney disease, stage 4 (severe): Secondary | ICD-10-CM | POA: Diagnosis not present

## 2022-09-19 DIAGNOSIS — E1142 Type 2 diabetes mellitus with diabetic polyneuropathy: Secondary | ICD-10-CM

## 2022-09-19 DIAGNOSIS — K659 Peritonitis, unspecified: Secondary | ICD-10-CM | POA: Diagnosis not present

## 2022-09-19 LAB — CBG MONITORING, ED: Glucose-Capillary: 104 mg/dL — ABNORMAL HIGH (ref 70–99)

## 2022-09-19 LAB — VITAMIN B12: Vitamin B-12: 1301 pg/mL — ABNORMAL HIGH (ref 180–914)

## 2022-09-19 LAB — BODY FLUID CULTURE W GRAM STAIN: Culture: NO GROWTH

## 2022-09-19 LAB — PATHOLOGIST SMEAR REVIEW

## 2022-09-19 MED ORDER — HYDROMORPHONE HCL 2 MG PO TABS
2.0000 mg | ORAL_TABLET | ORAL | 0 refills | Status: DC | PRN
Start: 2022-09-19 — End: 2022-09-19

## 2022-09-19 NOTE — Care Management Obs Status (Signed)
MEDICARE OBSERVATION STATUS NOTIFICATION   Patient Details  Name: Theresa [name] MRN: [id number] Date of Birth: Nov 24, 1940   Medicare Observation Status Notification Given:  Yes    Darolyn Rua, LCSW 09/19/2022, 10:40 AM

## 2022-09-19 NOTE — Progress Notes (Unsigned)
Location:  Other [place].  Nursing Home Room Number: [facility] 101A Place of Service:  SNF [phone]) Provider:  Dr. Earnestine Mealing  PCP: Barbette Reichmann, MD  Patient Care Team: Barbette Reichmann, MD as PCP - General (Internal Medicine)  Extended Emergency Contact Information Primary Emergency Contact: Cullinan,Richard P Address: [address] [place], [place] [address] [place] Home Phone: [phone] Relation: Spouse Secondary Emergency Contact: Lucas,Kristy [place] Home Phone: [phone] Relation: Daughter  Code Status:  Full Code Goals of care: Advanced Directive information    09/19/2022   11:58 AM  Advanced Directives  Does Patient Have a Medical Advance Directive? No  Would patient like information on creating a medical advance directive? No - Patient declined     Chief Complaint  Patient presents with   Hospitalization Follow-up    ER Follow up   Admission    Admission.     HPI:  Pt is a 82 y.o. female seen today for ER Follow up  Patient went to the emergency department for increased abdominal pain. Patient recently had a Pleurx placed. Drained in the first 72 hours, but only 100 mL in the ED. Concern for SBP at that time. Patient returned to ED where she received rocephin. Decision was made to return to SNF after some observation.  She continues to have some pain. Her son and husband are present as she eats lunch. Spouse is becomes tearful and states, "I guess it's all coming faster than we thought." He would like to have the children make the decision for hospice.    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   Sleep apnea    SOB (shortness of breath)    Stroke (HCC)    Temporal arteritis (HCC)    Thrombocytopenia (HCC)    Thrombocytopenia (HCC)    Past Surgical History:  Procedure Laterality Date   COLONOSCOPY  10/24/2013, 2002   hyperplastic polpys   CORONARY ANGIOPLASTY     CORONARY ARTERY BYPASS GRAFT  09/1998   ESOPHAGOGASTRODUODENOSCOPY (EGD) WITH PROPOFOL N/A 08/26/2016   Procedure: ESOPHAGOGASTRODUODENOSCOPY (EGD) WITH PROPOFOL;  Surgeon: Scot Jun, MD;  Location: [facility] ENDOSCOPY;  Service: Endoscopy;  Laterality: N/A;   ESOPHAGOGASTRODUODENOSCOPY (EGD) WITH PROPOFOL N/A 02/05/2018   Procedure: ESOPHAGOGASTRODUODENOSCOPY (EGD) WITH PROPOFOL;  Surgeon: Scot Jun, MD;  Location: [facility] ENDOSCOPY;  Service: Endoscopy;  Laterality: N/A;   ESOPHAGOGASTRODUODENOSCOPY (EGD) WITH PROPOFOL N/A 07/22/2019   Procedure: ESOPHAGOGASTRODUODENOSCOPY (EGD) WITH PROPOFOL;  Surgeon: Toledo, Boykin Nearing, MD;  Location: [facility] ENDOSCOPY;  Service: Gastroenterology;  Laterality: N/A;   ESOPHAGOGASTRODUODENOSCOPY ENDOSCOPY  05/24/2011   hemorroid surgery     JOINT REPLACEMENT  2003  2005   [email or web address]    Allergies  Allergen Reactions   Tape  Rash   Morphine And Codeine Nausea And Vomiting    Other reaction(s): Nausea And Vomiting   Other Rash    NEOPRENE, TAPE, BAND-AID TOUGH STRIPS    Outpatient Encounter Medications as of 09/19/2022  Medication Sig   diclofenac Sodium (VOLTAREN) 1 % GEL Apply 2 g topically every 6 (six) hours as needed.   famotidine (PEPCID) 20 MG tablet Take 20 mg by mouth daily.   fluticasone (FLONASE) 50 MCG/ACT nasal spray Place 1 spray into both nostrils 2 (two) times daily.   [employer] [facility] EX) Apply to buttocks/peri area  topically every shift.   insulin lispro (HUMALOG) 100 UNIT/ML injection Inject 5 Units into the skin 3 (three) times daily with meals.   levothyroxine (SYNTHROID) 25 MCG tablet Take 25 mcg by mouth daily before breakfast.   melatonin 3 MG TABS tablet Take 3 mg by mouth at bedtime.   rifaximin (XIFAXAN) 550 MG TABS tablet Take 550 mg by mouth 2 (two) times daily.    spironolactone (ALDACTONE) 25 MG tablet Take 25 mg by mouth 2 (two) times daily.   torsemide (DEMADEX) 20 MG tablet Take 20 mg by mouth daily.   Zinc Oxide (TRIPLE PASTE) 12.8 % ointment Apply 1 Application topically. Every shift.   [DISCONTINUED] acetaminophen (TYLENOL CHILDRENS PAIN + FEVER) 160 MG/5ML suspension Take 20.3 mLs by mouth every 8 (eight) hours as needed.   [DISCONTINUED] HYDROmorphone (DILAUDID) 2 MG tablet Take 1 tablet (2 mg total) by mouth every 4 (four) hours as needed for severe pain.   [DISCONTINUED] lactulose (CHRONULAC) 10 GM/15ML solution Take 20 g by mouth 2 (two) times daily.   [DISCONTINUED] nystatin (MYCOSTATIN/NYSTOP) powder Apply 1 Application topically 3 (three) times daily.   [DISCONTINUED] potassium chloride SA (KLOR-CON M) 20 MEQ tablet Take 20 mEq by mouth daily.   Facility-Administered Encounter Medications as of 09/19/2022  Medication   albuterol (PROVENTIL) (2.5 MG/3ML) 0.083% nebulizer solution 2.5 mg   cefTRIAXone (ROCEPHIN) 2 g in sodium chloride 0.9 % 100 mL IVPB   diphenhydrAMINE (BENADRYL) injection 12.5 mg   HYDROmorphone (DILAUDID) injection 1 mg   HYDROmorphone (DILAUDID) tablet 2 mg   insulin aspart (novoLOG) injection 0-6 Units   levothyroxine (SYNTHROID) tablet 25 mcg   ondansetron (ZOFRAN) tablet 4 mg   Or   ondansetron (ZOFRAN) injection 4 mg   rifaximin (XIFAXAN) tablet 550 mg   spironolactone (ALDACTONE) tablet 12.5 mg   torsemide (DEMADEX) tablet 20 mg    Review of Systems  Immunization History  Administered Date(s) Administered   Hep A / Hep B 09/11/2013,  10/21/2013, 03/25/2014   Influenza-Unspecified 11/26/2013, 11/27/2014, 12/30/2015, 12/24/2018, 01/13/2020, 12/23/2021   PFIZER Comirnaty(Gray Top)Covid-19 Tri-Sucrose Vaccine 04/03/2019, 04/24/2019, 12/26/2019   PFIZER(Purple Top)SARS-COV-2 Vaccination 04/03/2019, 04/24/2019   PPD Test 08/01/2022   Pfizer Covid-19 Vaccine Bivalent Booster 29yrs & up 01/12/2021   Pneumococcal Conjugate-13 04/13/2017   Pneumococcal Polysaccharide-23 12/30/2015   RSV,unspecified 06/08/2022   Zoster Recombinat (Shingrix) 06/12/2017   Pertinent  Health Maintenance Due  Topic Date Due   FOOT EXAM  Never done   OPHTHALMOLOGY EXAM  Never done   DEXA SCAN  Never done   HEMOGLOBIN A1C  12/07/2022      11/19/2020   11:00 PM 11/20/2020    8:00 AM 11/20/2020   10:00 PM 11/21/2020    7:05 AM 09/12/2022    9:57 AM  Fall Risk  Falls in the past year?     0  Was there an injury with Fall?  0  Fall Risk Category Calculator     0  (RETIRED) Patient Fall Risk Level High fall risk High fall risk High fall risk High fall risk   Patient at Risk for Falls Due to     No Fall Risks  Fall risk Follow up     Falls evaluation completed   Functional Status Survey:    Vitals:   09/19/22 1151  BP: 124/77  Pulse: 79  Resp: 19  Temp: 97.8 F (36.6 C)  SpO2: 99%  Weight: 172 lb (78 kg)  Height: 5' (1.524 m)   Body mass index is 33.59 kg/m. Physical Exam Constitutional:      Comments: Patient keeps her eyes closed and is moaning between taking bites of food. "The food is good."  Abdominal:     General: There is distension.     Tenderness: There is abdominal tenderness. There is guarding.  Skin:    General: Skin is warm and dry.  Neurological:     Mental Status: She is alert and oriented to person, place, and time.     Labs reviewed: Recent Labs    08/11/22 0000 08/18/22 0000 09/18/22 0900  NA 131* 133* 130*  K 4.0 4.0 4.5  CL 101 98* 96*  CO2 19 23* 22  GLUCOSE  --   --  112*  BUN 28* 29* 38*   CREATININE 1.7* 1.9* 1.60*  CALCIUM 5.1* 6.5* 8.0*   Recent Labs    06/06/22 0000 08/19/22 0000 09/18/22 0900  AST 52* 49* 68*  ALT 60* 45* 30  ALKPHOS 122 142* 107  BILITOT  --   --  5.4*  PROT  --   --  7.5  ALBUMIN 3.4* 2.6* 3.3*   Recent Labs    06/06/22 0000 08/04/22 0000 09/18/22 0900 09/18/22 2121  WBC 4.2 6.7 4.3  --   NEUTROABS 3,049.00 5,306.00  --   --   HGB 10.2* 10.1* 8.5* 8.2*  HCT 30* 30* 26.4*  --   MCV  --   --  94.3  --   PLT 49* 67* 68*  --    Lab Results  Component Value Date   TSH 2.319 11/20/2020   Lab Results  Component Value Date   HGBA1C 6.3 06/06/2022   Lab Results  Component Value Date   CHOL 195 04/23/2016   HDL 43 04/23/2016   LDLCALC 138 (H) 04/23/2016   TRIG 70 04/23/2016   CHOLHDL 4.5 04/23/2016    Significant Diagnostic Results in last 30 days:  CT ABDOMEN PELVIS WO CONTRAST  Result Date: 09/18/2022 CLINICAL DATA:  Right lower quadrant abdominal pain. Recent peritoneal drain catheter placement 2 days ago EXAM: CT ABDOMEN AND PELVIS WITHOUT CONTRAST TECHNIQUE: Multidetector CT imaging of the abdomen and pelvis was performed following the standard protocol without IV contrast. RADIATION DOSE REDUCTION: This exam was performed according to the departmental dose-optimization program which includes automated exposure control, adjustment of the mA and/or kV according to patient size and/or use of iterative reconstruction technique. COMPARISON:  CT 05/12/2016 FINDINGS: Lower chest: Trace bilateral pleural effusions. Right basilar atelectasis or consolidation. Hepatobiliary: Shrunken, nodular appearance of the liver compatible with cirrhosis. Evaluation of the hepatic parenchyma is limited in the absence of intravenous contrast. Multiple layering stones within the gallbladder. Pancreas: Unremarkable. No pancreatic ductal dilatation or surrounding inflammatory changes. Spleen: Splenomegaly. Adrenals/Urinary Tract: Unremarkable adrenal glands.  Both kidneys are within normal limits. No renal stone or hydronephrosis. There is a focal area of nodular  thickening along the anterior wall of the urinary bladder measuring approximately 1.6 x 0.8 cm (series 2, image 58). Stomach/Bowel: Stomach is within normal limits. Appendix appears normal. Extensive colonic diverticulosis. No evidence of bowel wall thickening, distention, or inflammatory changes. Vascular/Lymphatic: Aortic atherosclerosis. No enlarged abdominal or pelvic lymph nodes. Reproductive: Uterus and bilateral adnexa are unremarkable. Other: Small volume abdominopelvic ascites. Diffuse mesenteric edema. Ascitic fluid within an umbilical hernia. There is a small bore peritoneal drainage catheter position within the pelvis. No free intraperitoneal air. Musculoskeletal: Multilevel lumbar spondylosis. No acute bony abnormality. Generalized anasarca. IMPRESSION: 1. Cirrhotic liver with sequela of portal hypertension including splenomegaly and small volume abdominopelvic ascites. 2. Cholelithiasis. 3. Extensive colonic diverticulosis without evidence of acute diverticulitis. 4. Focal area of nodular thickening along the anterior wall of the urinary bladder measuring approximately 1.6 x 0.8 cm. This could reflect a small bladder mass. Further evaluation with cystoscopy is recommended. 5. Trace bilateral pleural effusions with right basilar atelectasis or consolidation. 6. Aortic atherosclerosis (ICD10-I70.0). Electronically Signed   By: Duanne Guess D.O.   On: 09/18/2022 10:13    Assessment/Plan Chronic heart failure with preserved ejection fraction (HCC)  Cirrhosis, cryptogenic (HCC)  Secondary esophageal varices without bleeding (HCC)  CKD (chronic kidney disease), stage IV (HCC)  Type 2 diabetes mellitus with peripheral neuropathy [facility])  Palliative care patient  Atrial flutter, paroxysmal (HCC) At this time the primary goal is comfort and pain control. Continue Dilaudid 2mg  q4hr PRN and  additional 1mg  q2hr PRN for breakthrough pain. Pleurx to drain every other day unless volumes exceed 1 liter, then could transition to daily. If less than 100 mL can spread to every 3 days draining. Family to provide more care supplies. Plan to transition to hospice after family has further discussion regarding timing.   Family/ staff Communication: Nursing, son, spouse  Labs/tests ordered:  none

## 2022-09-19 NOTE — Care Management CC44 (Signed)
Condition Code 44 Documentation Completed  Patient Details  Name: Theresa [name] MRN: [id number] Date of Birth: [birth year]   Condition Code 44 given:  Yes Patient signature on Condition Code 44 notice:  Yes Documentation of 2 MD's agreement:  Yes Code 44 added to claim:  Yes    Darolyn Rua, LCSW 09/19/2022, 10:40 AM

## 2022-09-19 NOTE — Discharge Summary (Signed)
Theresa [name] WNU:[id number] DOB: 09/25/1940 DOA: 09/18/2022  PCP: Barbette Reichmann, MD  Admit date: 09/18/2022 Discharge date: 09/19/2022  Time spent: 35 minutes  Recommendations for Outpatient Follow-up:  Close pcp f/u     Discharge Diagnoses:  Principal Problem:   Bacterial peritonitis (HCC) Active Problems:   Cirrhosis (HCC)   CVA (cerebral vascular accident) (HCC)   Type 2 diabetes mellitus with peripheral neuropathy (HCC)   Benign essential hypertension   CKD (chronic kidney disease), stage III (HCC)   Esophageal varices without bleeding (HCC)   CAD (coronary artery disease) of artery bypass graft   Atrial flutter, paroxysmal (HCC)   (HFpEF) heart failure with preserved ejection fraction (HCC)   Obesity, morbid (HCC)   Discharge Condition: stable  Diet recommendation: low sodium  Filed Weights   09/18/22 0829  Weight: 76 kg    History of present illness:  From admission h and p  Theresa [name] is a 82 y.o. 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  Per family who gives history -abdominal pain has increased over the last 48 hours. Per family Pleurx cath has been functional and last drain was yesterday in the amount of 1.3 L. Patient throughout this time has used oral dilaudid that was prescribed for chronic abdominal pain  related to ascites without improvement.  Of note patient denies any associated symptoms of vomiting/ decrease oral intake , dysuria, fever/chills/ cough /sob or chest pain. Family has noted over the  last few days patient has had improvement in her activity and cognitive status inspite of pain issues.  However, due to progression of pain they brought patient in for evaluation.   Hospital Course:   Patient has a history of end-stage liver disease. Recent hospitalization at [facility] for recurrent ascites and abdominal pain. Extensive workup there including CT showing cholelithiasis with gb wall thickening but with negative hida scan, diagnostic and therapeutic paracenteses no SBP, liver u/s with doppler no thrombus. Had pleurx catheter placed last week for prn therapeutic paracentesis as ascites thought to contribute to abdominal pain. Presented here with RLQ abdominal pain she reports as similar to prior. CT here with nothing acute, does show nodulcar thickening of anterior wall of bladder that could represent mass though no pain in that area, think that incidental. Ascites is small volume. No change in mental status, fever, leukocytosis to strongly suggest SBP though patient was started on abx for this. Pleural fluid analysis obtained prior to start of abx is not consistent with SBP with less than 250 PMNs. Blood and ascitic fluid cultures ngtd on hospital day one, no organism seen on pleural fluid culture. Her abdominal pain is resolved. She appears to be back to her baseline though her current baseline is tenuous with end-stage liver disease. Appears family is moving toward hospice and this is appropriate. Family does request dose and frequency change of home dialudid so I have written for that. Advise close f/u with her [facility] care team if a more palliative approach is not pursued.   Procedures: none   Consultations: none  Discharge Exam: Vitals:   09/19/22  0536 09/19/22 0830  BP:  (!) 94/51  Pulse:  96  Resp:  18  Temp: 98.3 F (36.8 C)   SpO2:  100%    General: NAD Cardiovascular: RRR Respiratory: rales at bases Abdomen: distended, soft Ext: trace LE edema  Discharge  Instructions   Discharge Instructions     Diet - low sodium heart healthy   Complete by: As directed    Increase activity slowly   Complete by: As directed       Allergies as of 09/19/2022       Reactions   Tape Rash   Morphine And Codeine Nausea And Vomiting   Other reaction(s): Nausea And Vomiting   Other Rash   NEOPRENE, TAPE, BAND-AID TOUGH STRIPS        Medication List     STOP taking these medications    alendronate 70 MG tablet Commonly known as: FOSAMAX   Farxiga 10 MG Tabs tablet Generic drug: dapagliflozin propanediol   ferrous sulfate 325 (65 FE) MG tablet   melatonin 1 MG Tabs tablet   simvastatin 20 MG tablet Commonly known as: ZOCOR       TAKE these medications    DERMACLOUD EX Apply to buttocks/peri area topically every shift.   famotidine 20 MG tablet Commonly known as: PEPCID Take 20 mg by mouth daily.   fluticasone 50 MCG/ACT nasal spray Commonly known as: FLONASE Place 1 spray into both nostrils 2 (two) times daily.   HYDROmorphone 2 MG tablet Commonly known as: Dilaudid Take 1 tablet (2 mg total) by mouth every 4 (four) hours as needed for severe pain. What changed: when to take this   insulin lispro 100 UNIT/ML injection Commonly known as: HUMALOG Inject 5 Units into the skin 3 (three) times daily with meals.   lactulose 10 GM/15ML solution Commonly known as: CHRONULAC Take 20 g by mouth 2 (two) times daily.   levothyroxine 25 MCG tablet Commonly known as: SYNTHROID Take 25 mcg by mouth daily before breakfast.   nystatin powder Commonly known as: MYCOSTATIN/NYSTOP Apply 1 Application topically 3 (three) times daily.   potassium chloride SA 20 MEQ tablet Commonly known as: KLOR-CON M Take 20 mEq by mouth daily.   spironolactone 25 MG tablet Commonly known as: ALDACTONE Take 25 mg by mouth 2 (two) times daily.   torsemide 20 MG tablet Commonly known as: DEMADEX Take 20 mg by mouth daily.   Tylenol Childrens  Pain + Fever 160 MG/5ML suspension Generic drug: acetaminophen Take 20.3 mLs by mouth every 8 (eight) hours as needed.   Voltaren 1 % Gel Generic drug: diclofenac Sodium Apply 2 g topically every 6 (six) hours as needed.   Xifaxan 550 MG Tabs tablet Generic drug: rifaximin Take 550 mg by mouth 2 (two) times daily.       Allergies  Allergen Reactions   Tape Rash   Morphine And Codeine Nausea And Vomiting    Other reaction(s): Nausea And Vomiting   Other Rash    NEOPRENE, TAPE, BAND-AID TOUGH STRIPS    Follow-up Information     Barbette Reichmann, MD Follow up.   Specialty: Internal Medicine Contact information: [address] [place] [address] [phone]                  The results of significant diagnostics from this hospitalization (including imaging, microbiology, ancillary and laboratory) are listed below for reference.    Significant Diagnostic Studies: CT ABDOMEN PELVIS WO CONTRAST  Result Date: 09/18/2022  CLINICAL DATA:  Right lower quadrant abdominal pain. Recent peritoneal drain catheter placement 2 days ago EXAM: CT ABDOMEN AND PELVIS WITHOUT CONTRAST TECHNIQUE: Multidetector CT imaging of the abdomen and pelvis was performed following the standard protocol without IV contrast. RADIATION DOSE REDUCTION: This exam was performed according to the departmental dose-optimization program which includes automated exposure control, adjustment of the mA and/or kV according to patient size and/or use of iterative reconstruction technique. COMPARISON:  CT 05/12/2016 FINDINGS: Lower chest: Trace bilateral pleural effusions. Right basilar atelectasis or consolidation. Hepatobiliary: Shrunken, nodular appearance of the liver compatible with cirrhosis. Evaluation of the hepatic parenchyma is limited in the absence of intravenous contrast. Multiple layering stones within the gallbladder. Pancreas: Unremarkable. No pancreatic ductal dilatation or surrounding  inflammatory changes. Spleen: Splenomegaly. Adrenals/Urinary Tract: Unremarkable adrenal glands. Both kidneys are within normal limits. No renal stone or hydronephrosis. There is a focal area of nodular thickening along the anterior wall of the urinary bladder measuring approximately 1.6 x 0.8 cm (series 2, image 58). Stomach/Bowel: Stomach is within normal limits. Appendix appears normal. Extensive colonic diverticulosis. No evidence of bowel wall thickening, distention, or inflammatory changes. Vascular/Lymphatic: Aortic atherosclerosis. No enlarged abdominal or pelvic lymph nodes. Reproductive: Uterus and bilateral adnexa are unremarkable. Other: Small volume abdominopelvic ascites. Diffuse mesenteric edema. Ascitic fluid within an umbilical hernia. There is a small bore peritoneal drainage catheter position within the pelvis. No free intraperitoneal air. Musculoskeletal: Multilevel lumbar spondylosis. No acute bony abnormality. Generalized anasarca. IMPRESSION: 1. Cirrhotic liver with sequela of portal hypertension including splenomegaly and small volume abdominopelvic ascites. 2. Cholelithiasis. 3. Extensive colonic diverticulosis without evidence of acute diverticulitis. 4. Focal area of nodular thickening along the anterior wall of the urinary bladder measuring approximately 1.6 x 0.8 cm. This could reflect a small bladder mass. Further evaluation with cystoscopy is recommended. 5. Trace bilateral pleural effusions with right basilar atelectasis or consolidation. 6. Aortic atherosclerosis (ICD10-I70.0). Electronically Signed   By: Duanne Guess D.O.   On: 09/18/2022 10:13    Microbiology: Recent Results (from the past 240 hour(s))  Body fluid culture w Gram Stain     Status: None (Preliminary result)   Collection Time: 09/18/22  8:45 AM   Specimen: Ascitic; Body Fluid  Result Value Ref Range Status   Specimen Description   Final    ASCITIC Performed at [facility], [address] [place], [place] [address]    Special Requests   Final    NONE Performed at [facility], [address] [place], [place] [address]    Gram Stain NO WBC SEEN NO ORGANISMS SEEN   Final   Culture   Final    NO GROWTH < 24 HOURS Performed at [facility], [address] [place], [place] [address]    Report Status PENDING  Incomplete  Blood culture (single)     Status: None (Preliminary result)   Collection Time: 09/18/22  9:00 AM   Specimen: BLOOD  Result Value Ref Range Status   Specimen Description BLOOD BLOOD LEFT ARM  Final   Special Requests   Final    BOTTLES DRAWN AEROBIC AND ANAEROBIC Blood Culture adequate volume   Culture   Final    NO GROWTH < 24 HOURS Performed at [facility], [address], [place], [place] [address]    Report Status PENDING  Incomplete     Labs: Basic Metabolic Panel: Recent Labs  Lab 09/18/22 0900  NA 130*  K 4.5  CL 96*  CO2 22  GLUCOSE 112*  BUN 38*  CREATININE 1.60*  CALCIUM 8.0*   Liver Function Tests: Recent Labs  Lab 09/18/22 0900  AST 68*  ALT 30  ALKPHOS 107  BILITOT 5.4*  PROT 7.5  ALBUMIN 3.3*   No results for input(s): "LIPASE", "AMYLASE" in the last 168 hours. No results for input(s): "AMMONIA" in the last 168 hours. CBC: Recent Labs  Lab 09/18/22 0900 09/18/22 2121  WBC 4.3  --   HGB 8.5* 8.2*  HCT 26.4*  --   MCV 94.3  --   PLT 68*  --    Cardiac Enzymes: No results for input(s): "CKTOTAL", "CKMB", "CKMBINDEX", "TROPONINI" in the last 168 hours. BNP: BNP (last 3 results) No results for input(s): "BNP" in the last 8760 hours.  ProBNP (last 3 results) No results for input(s): "PROBNP" in the last 8760 hours.  CBG: Recent Labs  Lab 09/18/22 1619 09/18/22 2221 09/19/22 0936  GLUCAP 79 135* 104*       Signed:  Silvano Bilis MD.  [facility] 09/19/2022, 9:37 AM

## 2022-09-19 NOTE — Progress Notes (Signed)
CSW spoke with Crystal at [facility] she reports patient can return via [facility], requests RN call report to [phone] . No additional discharge needs identified at this time.   Darolyn Rua, [place], MSW, [place] [phone]

## 2022-09-19 NOTE — ED Notes (Addendum)
Xeroform gauze placed to skin tear on posterior L ankle and covered with large bandaid. New tegaderm & gauze placed over pleurx catheter to R lower abdomen. Perineal care performed and new absorbent pads placed. Pt c/o pain during movement.

## 2022-09-20 MED ORDER — HYDROMORPHONE HCL 2 MG PO TABS: 2.0000 mg | ORAL_TABLET | ORAL | 0 refills | Status: DC | PRN

## 2022-09-20 MED ORDER — HYDROMORPHONE HCL 2 MG PO TABS
1.0000 mg | ORAL_TABLET | ORAL | 0 refills | Status: DC | PRN
Start: 2022-09-20 — End: 2022-09-21

## 2022-09-21 ENCOUNTER — Other Ambulatory Visit: Payer: Self-pay | Admitting: Student

## 2022-09-21 DIAGNOSIS — Z515 Encounter for palliative care: Secondary | ICD-10-CM

## 2022-09-21 LAB — BODY FLUID CULTURE W GRAM STAIN: Gram Stain: NONE SEEN

## 2022-09-21 MED ORDER — HYDROMORPHONE HCL 2 MG PO TABS: 2.0000 mg | ORAL_TABLET | ORAL | 0 refills | Status: DC

## 2022-09-21 MED ORDER — HYDROMORPHONE HCL 1 MG/ML PO LIQD
2.0000 mg | ORAL | 0 refills | Status: DC | PRN
Start: 2022-09-21 — End: 2022-09-22

## 2022-09-21 NOTE — Progress Notes (Signed)
Patient with increased pain despite PRN medications will schedule Dilaudid 2mg  PO tablet q4 hours. Will also have 2 mg (1mg /mL) solution PO q2 hours PRN for breakthrough pain.

## 2022-09-22 ENCOUNTER — Other Ambulatory Visit: Payer: Self-pay | Admitting: Nurse Practitioner

## 2022-09-22 DIAGNOSIS — Z515 Encounter for palliative care: Secondary | ICD-10-CM

## 2022-09-22 LAB — CULTURE, BLOOD (SINGLE): Special Requests: ADEQUATE

## 2022-09-22 MED ORDER — HYDROMORPHONE HCL 4 MG PO TABS
ORAL_TABLET | ORAL | 0 refills | Status: DC
Start: 2022-09-22 — End: 2022-11-02

## 2022-09-23 ENCOUNTER — Non-Acute Institutional Stay (SKILLED_NURSING_FACILITY): Payer: Medicare Other | Admitting: Student

## 2022-09-23 ENCOUNTER — Encounter: Payer: Self-pay | Admitting: Student

## 2022-09-23 DIAGNOSIS — K7210 Chronic hepatic failure without coma: Secondary | ICD-10-CM | POA: Insufficient documentation

## 2022-09-23 LAB — CULTURE, BLOOD (SINGLE): Culture: NO GROWTH

## 2022-09-26 NOTE — Progress Notes (Signed)
Location:  Other [place].  Nursing Home Room Number: [facility] [address]A Place of Service:  SNF [phone]) Provider:  Dr. Earnestine Mealing  PCP: Barbette Reichmann, MD  Patient Care Team: Barbette Reichmann, MD as PCP - General (Internal Medicine)  Extended Emergency Contact Information Primary Emergency Contact: Qadir,Richard P Address: [address] [place], [place] [address] [place] Home Phone: [phone] Relation: Spouse Secondary Emergency Contact: Lucas,Kristy [place] Home Phone: [phone] Relation: Daughter  Code Status:  Full Code Goals of care: Advanced Directive information    09/14/2022   11:00 AM  Advanced Directives  Does Patient Have a Medical Advance Directive? Yes  Type of Advance Directive Out of facility DNR (pink MOST or yellow form)  Does patient want to make changes to medical advance directive? No - Patient declined     Chief Complaint  Patient presents with   Acute Visit    Hospice Care.     HPI:  Pt is a 82 y.o. female seen today for an acute visit for Hospice Care.   She hasn't had any urine or fecal output since mid day yesterday. No urine this morning. Slept all night. No food since breakfast yesterday. Very little food yesterday.   2mg   of dilaudid this morning from 8PM until this morning without any medication, had some dilaudid around 10AM.   Some twitching-- seems to be involuntary twitching, but no interaction this morning.   Some breakfast yesterday.   When asked about code status and offered the following: DNR, but would like to trial Narcan if she has signs of respiratory depression. Neysa Bonito, patient's daughter states, "I think we are at that point and that would be aligned with our goals." They are not interested in transferring her to inpatient hospice facility.    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   Sleep apnea    SOB (shortness of breath)    Stroke (HCC)    Temporal arteritis (HCC)    Thrombocytopenia (HCC)    Thrombocytopenia (HCC)    Past Surgical History:  Procedure Laterality Date   COLONOSCOPY  10/24/2013, 2002   hyperplastic polpys   CORONARY ANGIOPLASTY     CORONARY ARTERY BYPASS GRAFT  09/1998   ESOPHAGOGASTRODUODENOSCOPY (EGD) WITH PROPOFOL N/A 08/26/2016   Procedure: ESOPHAGOGASTRODUODENOSCOPY (EGD) WITH PROPOFOL;  Surgeon: Scot Jun, MD;  Location: [facility] ENDOSCOPY;  Service: Endoscopy;  Laterality: N/A;   ESOPHAGOGASTRODUODENOSCOPY (EGD) WITH PROPOFOL N/A 02/05/2018   Procedure: ESOPHAGOGASTRODUODENOSCOPY (EGD) WITH PROPOFOL;  Surgeon: Scot Jun, MD;  Location: [facility] ENDOSCOPY;  Service: Endoscopy;  Laterality: N/A;   ESOPHAGOGASTRODUODENOSCOPY (EGD) WITH PROPOFOL N/A 07/22/2019   Procedure: ESOPHAGOGASTRODUODENOSCOPY (EGD) WITH PROPOFOL;  Surgeon: Toledo, Boykin Nearing, MD;  Location: [facility] ENDOSCOPY;  Service: Gastroenterology;  Laterality: N/A;   ESOPHAGOGASTRODUODENOSCOPY ENDOSCOPY  05/24/2011   hemorroid surgery     JOINT REPLACEMENT  2003  2005   bil.knees    Allergies  Allergen Reactions   Tape Rash   Morphine And Codeine Nausea And Vomiting    Other reaction(s): Nausea And Vomiting   Other Rash    NEOPRENE, TAPE, BAND-AID TOUGH STRIPS    Outpatient Encounter Medications as of 09/22/2022  Medication Sig   cetirizine (ZYRTEC) 10 MG tablet Take 10 mg by mouth daily.   diclofenac Sodium (VOLTAREN) 1 % GEL  Apply 2 g topically every 6 (six) hours as needed.   diphenhydrAMINE (BENADRYL) 25 mg capsule Take 12.5 mg by mouth daily as needed.   famotidine (PEPCID) 20 MG tablet Take 20 mg by mouth daily.   fluticasone (FLONASE) 50 MCG/ACT nasal spray Place 1 spray into both nostrils 2 (two) times daily.   HYDROmorphone (DILAUDID) 4 MG tablet 4 mg by mouth every 4 hours scheduled and 4 mg q2 hours PRN pain   HYDROmorphone HCl (DILAUDID) 1 MG/ML LIQD Take 2 mLs by mouth every 2 (two) hours as needed for severe pain.   hyoscyamine (LEVSIN) 0.125 MG/5ML ELIX Take 1 mL by mouth every 4 (four) hours as needed.   Infant Care Products [facility] EX) Apply to buttocks/peri area topically every shift.   insulin lispro (HUMALOG) 100 UNIT/ML injection Inject 5 Units into the skin 3 (three) times daily with meals.   levothyroxine (SYNTHROID) 25 MCG tablet Take 25 mcg by mouth daily before breakfast.   LORazepam (ATIVAN) 1 MG tablet Take 1 mg by mouth every three (3) days as needed for anxiety.   LORazepam 1 MG/0.5ML CONC Take 0.5 mLs by mouth every three (3) days as needed.   melatonin 3 MG TABS tablet Take 3 mg by mouth at bedtime.   rifaximin (XIFAXAN) 550 MG TABS tablet Take 550 mg by mouth 2 (two) times daily.    spironolactone (ALDACTONE) 25 MG tablet Take 25 mg by mouth 2 (two) times daily.   torsemide (DEMADEX) 20 MG tablet Take 20 mg by mouth daily.   Zinc Oxide (TRIPLE PASTE) 12.8 % ointment Apply 1 Application topically. Every shift.   No facility-administered encounter medications on file as of 09/24/2022.    Review of Systems  Immunization History  Administered Date(s) Administered   Hep A / Hep B 09/11/2013, 10/21/2013, 03/25/2014   Influenza-Unspecified 11/26/2013, 11/27/2014, 12/30/2015, 12/24/2018, 01/13/2020, 12/23/2021   PFIZER Comirnaty(Gray Top)Covid-19 Tri-Sucrose Vaccine 04/03/2019, 04/24/2019, 12/26/2019   PFIZER(Purple Top)SARS-COV-2 Vaccination 04/03/2019, 04/24/2019   PPD Test  08/01/2022   Pfizer Covid-19 Vaccine Bivalent Booster 65yrs & up 01/12/2021   Pneumococcal Conjugate-13 04/13/2017   Pneumococcal Polysaccharide-23 12/30/2015   RSV,unspecified 06/08/2022   Zoster Recombinat (Shingrix) 06/12/2017   Pertinent  Health Maintenance Due  Topic Date Due   FOOT EXAM  Never done   OPHTHALMOLOGY EXAM  Never done   DEXA SCAN  Never done   HEMOGLOBIN A1C  12/07/2022      11/19/2020   11:00 PM 11/20/2020    8:00 AM 11/20/2020   10:00 PM 11/21/2020    7:05 AM 09/12/2022    9:57 AM  Fall Risk  Falls in the past year?     0  Was there an injury with Fall?     0  Fall Risk Category Calculator     0  (RETIRED) Patient Fall Risk Level High fall risk High fall risk High fall risk High fall risk  Patient at Risk for Falls Due to     No Fall Risks  Fall risk Follow up     Falls evaluation completed   Functional Status Survey:    Vitals:   08/31/2022 1048  BP: (!) 87/56  Pulse: (!) 125  Resp: 18  Temp: 97.8 F (36.6 C)  SpO2: 92%  Weight: 170 lb (77.1 kg)  Height: 5' (1.524 m)   Body mass index is 33.2 kg/m. Physical Exam Constitutional:      Comments: somnolent  Cardiovascular:     Rate and Rhythm: Normal rate. Rhythm irregular.     Pulses: Normal pulses.  Pulmonary:     Effort: Pulmonary effort is normal.  Abdominal:     General: There is distension.  Musculoskeletal:     Comments: 3+ swelling in bilateral lower extremities. Weaping of the LLE.   Skin:    Comments: jaundice     Labs reviewed: Recent Labs    08/11/22 0000 08/18/22 0000 09/18/22 0900  NA 131* 133* 130*  K 4.0 4.0 4.5  CL 101 98* 96*  CO2 19 23* 22  GLUCOSE  --   --  112*  BUN 28* 29* 38*  CREATININE 1.7* 1.9* 1.60*  CALCIUM 5.1* 6.5* 8.0*   Recent Labs    06/06/22 0000 08/19/22 0000 09/18/22 0900  AST 52* 49* 68*  ALT 60* 45* 30  ALKPHOS 122 142* 107  BILITOT  --   --  5.4*  PROT  --   --  7.5  ALBUMIN 3.4* 2.6* 3.3*   Recent Labs    06/06/22 0000  08/04/22 0000 09/18/22 0900 09/18/22 2121  WBC 4.2 6.7 4.3  --   NEUTROABS 3,049.00 5,306.00  --   --   HGB 10.2* 10.1* 8.5* 8.2*  HCT 30* 30* 26.4*  --   MCV  --   --  94.3  --   PLT 49* 67* 68*  --    Lab Results  Component Value Date   TSH 2.319 11/20/2020   Lab Results  Component Value Date   HGBA1C 6.3 06/06/2022   Lab Results  Component Value Date   CHOL 195 04/23/2016   HDL 43 04/23/2016   LDLCALC 138 (H) 04/23/2016   TRIG 70 04/23/2016   CHOLHDL 4.5 04/23/2016    Significant Diagnostic Results in last 30 days:  CT ABDOMEN PELVIS WO CONTRAST  Result Date: 09/18/2022 CLINICAL DATA:  Right lower quadrant abdominal pain. Recent peritoneal drain catheter placement 2 days ago EXAM: CT ABDOMEN AND PELVIS WITHOUT CONTRAST TECHNIQUE: Multidetector CT imaging of the abdomen and pelvis was performed following the standard protocol without IV contrast. RADIATION DOSE REDUCTION: This exam was performed according to the departmental dose-optimization program which includes automated exposure control, adjustment of the mA and/or kV according to patient size and/or use of iterative reconstruction technique. COMPARISON:  CT 05/12/2016 FINDINGS: Lower chest: Trace bilateral pleural effusions. Right basilar atelectasis or consolidation. Hepatobiliary: Shrunken, nodular appearance of the liver compatible with cirrhosis. Evaluation of the hepatic parenchyma is limited in the absence of intravenous contrast. Multiple layering stones within the gallbladder. Pancreas: Unremarkable. No pancreatic ductal dilatation or surrounding inflammatory changes. Spleen: Splenomegaly. Adrenals/Urinary Tract: Unremarkable adrenal glands. Both kidneys are within normal limits. No renal stone or hydronephrosis. There is a focal area of nodular thickening along the anterior wall of the urinary bladder measuring approximately 1.6 x 0.8 cm (series 2, image 58). Stomach/Bowel: Stomach is within normal limits. Appendix  appears normal. Extensive colonic  diverticulosis. No evidence of bowel wall thickening, distention, or inflammatory changes. Vascular/Lymphatic: Aortic atherosclerosis. No enlarged abdominal or pelvic lymph nodes. Reproductive: Uterus and bilateral adnexa are unremarkable. Other: Small volume abdominopelvic ascites. Diffuse mesenteric edema. Ascitic fluid within an umbilical hernia. There is a small bore peritoneal drainage catheter position within the pelvis. No free intraperitoneal air. Musculoskeletal: Multilevel lumbar spondylosis. No acute bony abnormality. Generalized anasarca. IMPRESSION: 1. Cirrhotic liver with sequela of portal hypertension including splenomegaly and small volume abdominopelvic ascites. 2. Cholelithiasis. 3. Extensive colonic diverticulosis without evidence of acute diverticulitis. 4. Focal area of nodular thickening along the anterior wall of the urinary bladder measuring approximately 1.6 x 0.8 cm. This could reflect a small bladder mass. Further evaluation with cystoscopy is recommended. 5. Trace bilateral pleural effusions with right basilar atelectasis or consolidation. 6. Aortic atherosclerosis (ICD10-I70.0). Electronically Signed   By: Duanne Guess D.O.   On: 09/18/2022 10:13    Assessment/Plan End stage liver disease [facility]) Patient admitted to facility and is no on hospice. She has had no urine output and somnolence over the last 24 hours. She has had some issues with mottling of the skin and weaping of fluid on the LLE. At this time patient is likely somnolent due to increase in bilirubin. She is comfortable. Patient had extreme pain with attempt at draining pleurx yesterday and only 10 mL removed, will space to q3d and only pursue based on comfort. Discussed with nursing if patient shows signs of agitation to perform a bladder scan and place a foley catheter if >400 mL present. Family declined transfer to an inpatient hospice facility as she is more comfortable than usual.  At this time, family would prefer to stay in the facility. Also state It it would be most appropriate for her to be DNR, however, have narcan available in the event she has respiratory depression. Continue comfort and supportive care.    Family/ staff Communication: Spouse, Daughters Clydie Braun and [place]), [place] daughter.   Labs/tests ordered:  none  I spent greater than 45 minutes for the care of this patient in face to face time, chart review, clinical documentation, patient education. Contacting various inpatient hospice facilities to determine whether or not they could meet her care needs.

## 2022-09-26 DEATH — deceased
